# Patient Record
Sex: Female | Born: 1965
Health system: Southern US, Community
[De-identification: ages and names within clinical notes are randomized; demographics above are authoritative.]

## PROBLEM LIST (undated history)

## (undated) DIAGNOSIS — O24419 Gestational diabetes mellitus in pregnancy, unspecified control: Secondary | ICD-10-CM

## (undated) DIAGNOSIS — R32 Unspecified urinary incontinence: Secondary | ICD-10-CM

## (undated) DIAGNOSIS — G4730 Sleep apnea, unspecified: Secondary | ICD-10-CM

## (undated) DIAGNOSIS — J302 Other seasonal allergic rhinitis: Secondary | ICD-10-CM

## (undated) DIAGNOSIS — N939 Abnormal uterine and vaginal bleeding, unspecified: Secondary | ICD-10-CM

## (undated) DIAGNOSIS — Z860100 Personal history of colon polyps, unspecified: Secondary | ICD-10-CM

## (undated) DIAGNOSIS — F32A Depression, unspecified: Secondary | ICD-10-CM

## (undated) DIAGNOSIS — R222 Localized swelling, mass and lump, trunk: Secondary | ICD-10-CM

## (undated) DIAGNOSIS — Z8489 Family history of other specified conditions: Secondary | ICD-10-CM

## (undated) DIAGNOSIS — D649 Anemia, unspecified: Secondary | ICD-10-CM

## (undated) DIAGNOSIS — Z9889 Other specified postprocedural states: Secondary | ICD-10-CM

## (undated) DIAGNOSIS — R112 Nausea with vomiting, unspecified: Secondary | ICD-10-CM

## (undated) DIAGNOSIS — I499 Cardiac arrhythmia, unspecified: Secondary | ICD-10-CM

## (undated) DIAGNOSIS — I10 Essential (primary) hypertension: Secondary | ICD-10-CM

## (undated) DIAGNOSIS — F419 Anxiety disorder, unspecified: Secondary | ICD-10-CM

## (undated) DIAGNOSIS — T8859XA Other complications of anesthesia, initial encounter: Secondary | ICD-10-CM

## (undated) DIAGNOSIS — N200 Calculus of kidney: Secondary | ICD-10-CM

## (undated) DIAGNOSIS — F329 Major depressive disorder, single episode, unspecified: Secondary | ICD-10-CM

## (undated) DIAGNOSIS — M1990 Unspecified osteoarthritis, unspecified site: Secondary | ICD-10-CM

## (undated) DIAGNOSIS — Z8669 Personal history of other diseases of the nervous system and sense organs: Secondary | ICD-10-CM

## (undated) DIAGNOSIS — Z87442 Personal history of urinary calculi: Secondary | ICD-10-CM

## (undated) DIAGNOSIS — C801 Malignant (primary) neoplasm, unspecified: Secondary | ICD-10-CM

## (undated) HISTORY — DX: Abnormal uterine and vaginal bleeding, unspecified: N93.9

## (undated) HISTORY — DX: Personal history of other diseases of the nervous system and sense organs: Z86.69

## (undated) HISTORY — DX: Calculus of kidney: N20.0

## (undated) HISTORY — DX: Localized swelling, mass and lump, trunk: R22.2

## (undated) HISTORY — DX: Malignant (primary) neoplasm, unspecified: C80.1

## (undated) HISTORY — DX: Other seasonal allergic rhinitis: J30.2

## (undated) HISTORY — DX: Personal history of colon polyps, unspecified: Z86.0100

## (undated) HISTORY — DX: Major depressive disorder, single episode, unspecified: F32.9

## (undated) HISTORY — DX: Personal history of colonic polyps: Z86.010

## (undated) HISTORY — DX: Sleep apnea, unspecified: G47.30

## (undated) HISTORY — PX: LAPAROSCOPIC ENDOMETRIOSIS FULGURATION: SUR769

## (undated) HISTORY — DX: Anxiety disorder, unspecified: F41.9

## (undated) HISTORY — PX: INCONTINENCE SURGERY: SHX676

## (undated) HISTORY — DX: Depression, unspecified: F32.A

## (undated) HISTORY — DX: Gestational diabetes mellitus in pregnancy, unspecified control: O24.419

## (undated) HISTORY — DX: Unspecified urinary incontinence: R32

## (undated) HISTORY — PX: TYMPANOPLASTY: SHX33

## (undated) HISTORY — PX: BREAST BIOPSY: SHX20

## (undated) HISTORY — DX: Cardiac arrhythmia, unspecified: I49.9

---

## 1999-08-20 ENCOUNTER — Ambulatory Visit (HOSPITAL_COMMUNITY): Admission: RE | Admit: 1999-08-20 | Discharge: 1999-08-20 | Payer: Self-pay | Admitting: Obstetrics and Gynecology

## 1999-08-20 ENCOUNTER — Encounter: Payer: Self-pay | Admitting: Obstetrics and Gynecology

## 1999-11-17 ENCOUNTER — Emergency Department (HOSPITAL_COMMUNITY): Admission: EM | Admit: 1999-11-17 | Discharge: 1999-11-17 | Payer: Self-pay | Admitting: Emergency Medicine

## 1999-11-17 ENCOUNTER — Encounter: Payer: Self-pay | Admitting: Emergency Medicine

## 2000-01-15 ENCOUNTER — Other Ambulatory Visit: Admission: RE | Admit: 2000-01-15 | Discharge: 2000-01-15 | Payer: Self-pay | Admitting: Obstetrics and Gynecology

## 2000-03-08 ENCOUNTER — Inpatient Hospital Stay (HOSPITAL_COMMUNITY): Admission: AD | Admit: 2000-03-08 | Discharge: 2000-03-08 | Payer: Self-pay | Admitting: Obstetrics and Gynecology

## 2000-08-20 ENCOUNTER — Inpatient Hospital Stay (HOSPITAL_COMMUNITY): Admission: AD | Admit: 2000-08-20 | Discharge: 2000-08-23 | Payer: Self-pay | Admitting: Obstetrics & Gynecology

## 2000-09-27 ENCOUNTER — Emergency Department (HOSPITAL_COMMUNITY): Admission: EM | Admit: 2000-09-27 | Discharge: 2000-09-27 | Payer: Self-pay | Admitting: Emergency Medicine

## 2000-09-27 ENCOUNTER — Encounter: Payer: Self-pay | Admitting: Emergency Medicine

## 2001-01-13 ENCOUNTER — Encounter: Admission: RE | Admit: 2001-01-13 | Discharge: 2001-01-13 | Payer: Self-pay | Admitting: Family Medicine

## 2001-01-13 ENCOUNTER — Encounter: Payer: Self-pay | Admitting: Family Medicine

## 2001-11-25 ENCOUNTER — Encounter: Payer: Self-pay | Admitting: Family Medicine

## 2001-11-25 LAB — CONVERTED CEMR LAB
RBC count: 4.44 10*6/uL
TSH: 1.2 microintl units/mL

## 2002-07-24 ENCOUNTER — Other Ambulatory Visit: Admission: RE | Admit: 2002-07-24 | Discharge: 2002-07-24 | Payer: Self-pay | Admitting: Obstetrics and Gynecology

## 2002-07-24 ENCOUNTER — Other Ambulatory Visit: Admission: RE | Admit: 2002-07-24 | Discharge: 2002-07-24 | Payer: Self-pay | Admitting: Obstetrics & Gynecology

## 2002-10-12 HISTORY — PX: TUBAL LIGATION: SHX77

## 2003-03-28 ENCOUNTER — Encounter (INDEPENDENT_AMBULATORY_CARE_PROVIDER_SITE_OTHER): Payer: Self-pay | Admitting: *Deleted

## 2003-03-28 ENCOUNTER — Inpatient Hospital Stay (HOSPITAL_COMMUNITY): Admission: AD | Admit: 2003-03-28 | Discharge: 2003-04-01 | Payer: Self-pay | Admitting: Obstetrics and Gynecology

## 2004-04-16 ENCOUNTER — Other Ambulatory Visit: Admission: RE | Admit: 2004-04-16 | Discharge: 2004-04-16 | Payer: Self-pay | Admitting: Obstetrics and Gynecology

## 2004-10-12 HISTORY — PX: ENDOMETRIAL ABLATION: SHX621

## 2005-04-13 ENCOUNTER — Ambulatory Visit: Payer: Self-pay | Admitting: Family Medicine

## 2005-06-26 ENCOUNTER — Other Ambulatory Visit: Admission: RE | Admit: 2005-06-26 | Discharge: 2005-06-26 | Payer: Self-pay | Admitting: Obstetrics and Gynecology

## 2005-07-23 ENCOUNTER — Ambulatory Visit: Payer: Self-pay | Admitting: Family Medicine

## 2005-08-28 ENCOUNTER — Encounter (INDEPENDENT_AMBULATORY_CARE_PROVIDER_SITE_OTHER): Payer: Self-pay | Admitting: Specialist

## 2005-08-28 ENCOUNTER — Ambulatory Visit (HOSPITAL_COMMUNITY): Admission: RE | Admit: 2005-08-28 | Discharge: 2005-08-28 | Payer: Self-pay | Admitting: Obstetrics and Gynecology

## 2005-11-06 ENCOUNTER — Ambulatory Visit: Payer: Self-pay | Admitting: Family Medicine

## 2005-11-27 ENCOUNTER — Ambulatory Visit: Payer: Self-pay | Admitting: Family Medicine

## 2006-07-13 ENCOUNTER — Ambulatory Visit: Payer: Self-pay | Admitting: Family Medicine

## 2006-08-18 ENCOUNTER — Ambulatory Visit: Payer: Self-pay | Admitting: Family Medicine

## 2006-09-11 ENCOUNTER — Ambulatory Visit: Payer: Self-pay | Admitting: Family Medicine

## 2006-09-17 ENCOUNTER — Ambulatory Visit: Payer: Self-pay | Admitting: Family Medicine

## 2006-10-18 ENCOUNTER — Ambulatory Visit: Payer: Self-pay | Admitting: Unknown Physician Specialty

## 2007-01-14 ENCOUNTER — Ambulatory Visit: Payer: Self-pay | Admitting: Family Medicine

## 2007-06-13 HISTORY — PX: COLONOSCOPY: SHX174

## 2007-06-16 ENCOUNTER — Ambulatory Visit: Payer: Self-pay | Admitting: Internal Medicine

## 2007-06-24 ENCOUNTER — Ambulatory Visit: Payer: Self-pay | Admitting: Internal Medicine

## 2007-07-13 ENCOUNTER — Ambulatory Visit: Payer: Self-pay | Admitting: Family Medicine

## 2007-08-09 ENCOUNTER — Encounter (INDEPENDENT_AMBULATORY_CARE_PROVIDER_SITE_OTHER): Payer: Self-pay | Admitting: Diagnostic Radiology

## 2007-08-09 ENCOUNTER — Encounter: Admission: RE | Admit: 2007-08-09 | Discharge: 2007-08-09 | Payer: Self-pay | Admitting: Obstetrics and Gynecology

## 2008-07-24 ENCOUNTER — Ambulatory Visit: Payer: Self-pay | Admitting: Family Medicine

## 2008-08-14 ENCOUNTER — Ambulatory Visit: Payer: Self-pay | Admitting: Family Medicine

## 2008-08-15 ENCOUNTER — Ambulatory Visit: Payer: Self-pay | Admitting: Family Medicine

## 2008-08-15 ENCOUNTER — Encounter: Payer: Self-pay | Admitting: Family Medicine

## 2008-08-15 DIAGNOSIS — Z87442 Personal history of urinary calculi: Secondary | ICD-10-CM

## 2008-08-15 DIAGNOSIS — I491 Atrial premature depolarization: Secondary | ICD-10-CM | POA: Insufficient documentation

## 2008-08-15 HISTORY — DX: Personal history of urinary calculi: Z87.442

## 2008-08-15 HISTORY — DX: Atrial premature depolarization: I49.1

## 2008-08-30 ENCOUNTER — Ambulatory Visit: Payer: Self-pay | Admitting: Family Medicine

## 2008-08-30 DIAGNOSIS — R7303 Prediabetes: Secondary | ICD-10-CM | POA: Insufficient documentation

## 2008-08-30 DIAGNOSIS — Z8742 Personal history of other diseases of the female genital tract: Secondary | ICD-10-CM

## 2008-08-30 DIAGNOSIS — E1165 Type 2 diabetes mellitus with hyperglycemia: Secondary | ICD-10-CM | POA: Insufficient documentation

## 2008-09-04 ENCOUNTER — Encounter: Payer: Self-pay | Admitting: Family Medicine

## 2008-09-04 ENCOUNTER — Ambulatory Visit: Payer: Self-pay

## 2008-09-17 ENCOUNTER — Encounter: Payer: Self-pay | Admitting: Family Medicine

## 2008-09-24 ENCOUNTER — Ambulatory Visit: Payer: Self-pay | Admitting: Family Medicine

## 2008-09-24 LAB — CONVERTED CEMR LAB
ALT: 19 units/L (ref 0–35)
AST: 21 units/L (ref 0–37)
Albumin: 3.7 g/dL (ref 3.5–5.2)
Alkaline Phosphatase: 81 units/L (ref 39–117)
BUN: 12 mg/dL (ref 6–23)
Basophils Relative: 0.2 % (ref 0.0–3.0)
CO2: 29 meq/L (ref 19–32)
Chloride: 106 meq/L (ref 96–112)
Creatinine, Ser: 0.7 mg/dL (ref 0.4–1.2)
Eosinophils Absolute: 0.1 10*3/uL (ref 0.0–0.7)
Eosinophils Relative: 1.8 % (ref 0.0–5.0)
GFR calc non Af Amer: 97 mL/min
Glucose, Bld: 93 mg/dL (ref 70–99)
Glucose, Urine, Semiquant: NEGATIVE
HDL: 54.7 mg/dL (ref >39.0)
Ketones, urine, test strip: NEGATIVE
LDL Cholesterol: 122 mg/dL — ABNORMAL HIGH (ref 0–99)
Lymphocytes Relative: 23 % (ref 12.0–46.0)
MCV: 91.7 fL (ref 78.0–100.0)
Monocytes Relative: 9.7 % (ref 3.0–12.0)
Neutrophils Relative %: 65.3 % (ref 43.0–77.0)
Nitrite: POSITIVE
Platelets: 133 10*3/uL — ABNORMAL LOW (ref 150–400)
Potassium: 3.9 meq/L (ref 3.5–5.1)
Protein, U semiquant: 30
RBC: 4.24 M/uL (ref 3.87–5.11)
TSH: 1.55 microintl units/mL (ref 0.35–5.50)
Total CHOL/HDL Ratio: 3.5
VLDL: 15 mg/dL (ref 0–40)
WBC: 7.5 10*3/uL (ref 4.5–10.5)
pH: 6

## 2008-09-26 ENCOUNTER — Ambulatory Visit: Payer: Self-pay | Admitting: Family Medicine

## 2008-10-12 HISTORY — PX: OOPHORECTOMY: SHX86

## 2008-10-12 HISTORY — PX: BLADDER SUSPENSION: SHX72

## 2009-05-06 ENCOUNTER — Ambulatory Visit: Payer: Self-pay | Admitting: Family Medicine

## 2009-05-06 DIAGNOSIS — R21 Rash and other nonspecific skin eruption: Secondary | ICD-10-CM

## 2009-05-12 DIAGNOSIS — R32 Unspecified urinary incontinence: Secondary | ICD-10-CM

## 2009-05-12 HISTORY — PX: ABDOMINAL HYSTERECTOMY: SHX81

## 2009-05-12 HISTORY — PX: COLPORRHAPHY: SHX921

## 2009-05-12 HISTORY — DX: Unspecified urinary incontinence: R32

## 2009-05-21 ENCOUNTER — Inpatient Hospital Stay (HOSPITAL_COMMUNITY): Admission: RE | Admit: 2009-05-21 | Discharge: 2009-05-22 | Payer: Self-pay | Admitting: Obstetrics and Gynecology

## 2009-05-21 ENCOUNTER — Encounter (INDEPENDENT_AMBULATORY_CARE_PROVIDER_SITE_OTHER): Payer: Self-pay | Admitting: Obstetrics and Gynecology

## 2009-05-21 HISTORY — PX: VAGINAL HYSTERECTOMY: SUR661

## 2009-05-22 ENCOUNTER — Encounter (INDEPENDENT_AMBULATORY_CARE_PROVIDER_SITE_OTHER): Payer: Self-pay | Admitting: *Deleted

## 2009-07-11 ENCOUNTER — Ambulatory Visit: Payer: Self-pay | Admitting: Internal Medicine

## 2009-07-17 ENCOUNTER — Ambulatory Visit: Payer: Self-pay | Admitting: Family Medicine

## 2010-01-06 ENCOUNTER — Ambulatory Visit: Payer: Self-pay | Admitting: Family Medicine

## 2010-02-25 ENCOUNTER — Ambulatory Visit: Payer: Self-pay | Admitting: Family Medicine

## 2010-05-20 ENCOUNTER — Encounter (INDEPENDENT_AMBULATORY_CARE_PROVIDER_SITE_OTHER): Payer: Self-pay | Admitting: *Deleted

## 2010-07-10 ENCOUNTER — Ambulatory Visit: Payer: Self-pay | Admitting: Family Medicine

## 2010-07-19 ENCOUNTER — Ambulatory Visit: Payer: Self-pay | Admitting: Family Medicine

## 2010-07-19 LAB — CONVERTED CEMR LAB
Ketones, urine, test strip: NEGATIVE
Nitrite: POSITIVE
Urobilinogen, UA: NEGATIVE

## 2010-07-21 ENCOUNTER — Telehealth: Payer: Self-pay | Admitting: Family Medicine

## 2010-11-02 ENCOUNTER — Encounter: Payer: Self-pay | Admitting: Obstetrics and Gynecology

## 2010-11-11 NOTE — Letter (Signed)
Summary: Virginia Oneal letter  Virginia Oneal at [facility]  [address] [place], [place] [address]   Phone: [phone]  Fax: [phone]       05/20/2010 MRN: [id number]  [name] [address] [place], [place]  [address]  Dear Ms. [name] announce the retirement of Arta Silence, M.D., from full-time practice at the [facility] office effective April 10, 2010 and his plans of returning part-time.  It is important to Dr. Hetty Ely and to our practice that you understand that [facility] has seven physicians in our office for your health care needs.  We will continue to offer the same exceptional care that you have today.    Dr. Hetty Ely has spoken to many of you about his plans for retirement and returning part-time in the fall.   We will continue to work with you through the transition to schedule appointments for you in the office and meet the high standards that [facility] is committed to.   Again, it is with great pleasure that we share the news that Dr. Hetty Ely will return to [facility] in October of 2011 with a reduced schedule.    If you have any questions, or would like to request an appointment with one of our physicians, please call us at [phone] and press the option for Scheduling an appointment.  We take pleasure in providing you with excellent patient care and look forward to seeing you at your next office visit.  Our [facility] Physicians are:  Tillman Abide, M.D. Laurita Quint, M.D. Roxy Manns, M.D. Kerby Nora, M.D. Hannah Beat, M.D. Ruthe Mannan, M.D. We proudly welcomed Raechel Ache, M.D. and Eustaquio Boyden, M.D. to the practice in July/August 2011.  Sincerely,  [facility]

## 2010-11-11 NOTE — Assessment & Plan Note (Signed)
Summary: UTI/DLO   Vital Signs:  Patient profile:   45 year old female Height:      65 inches Weight:      227 pounds BMI:     37.91 O2 Sat:      97 % Temp:     97.3 degrees F oral Pulse rate:   77 / minute BP sitting:   122 / 90  (left arm) Cuff size:   regular  Vitals Entered By: Jarome Lamas (July 19, 2010 12:16 PM) CC: UTI?/pb   History of Present Illness: is having frequency of urination and burning when she urinated this am  saw blood in urine last night -- when she wiped  no fever or nausea or vomiting no new back pain -- has some chronic   does not get these very often   Current Medications (verified): 1)  Zyrtec Allergy 10 Mg Tabs (Cetirizine Hcl) .... One Tablet By Mouth Once Daily 2)  Amoxicillin 500 Mg Caps (Amoxicillin) .... 2 Tabs By Mouth Two Times A Day  Allergies (verified): No Known Drug Allergies  Past History:  Past Medical History: Last updated: 08-10-2009 Kidney Stones 03/23/2000, 2002-03-23  Past Surgical History: Last updated: 08-10-2009 NSVD X 1 PREECLAMPSIA/TOXEMIA COLONOSCOPY POLYP X 1 :(06/30/2004) (No pathology) COLONOSCOPY SMALL EXTERNAL HEMMOROID NEGATIVE POLYPS:(06/24/2007) C-section 6/04 lap-assisted vaginal hysterectomy and l salpingo-oopherectom 05/21/09 TVT sling for urinary incontinence 05/21/09 ablation of endometriosis 05/21/09  Family History: Last updated: 10-Aug-2009 Father: DECEASED YOUNG MI  Mother: DECEASED IN 1999-03-24 61 YOA RENAL FAILURE// COLON CANCER CHF; ANGIOPLASTY// COLON CANCER// + DM Siblings: 1 BROTHER// 4 SISTERS CV: +MOTHER; + FATHER HBP: ALL BROTHERS AND SISTERS EXCEPT 1 DM: + MOTHER  GOUT/ARTHRITIS:  PROSTATE CANCER: BREAST/OVARIAN/UTERINE CANCER: COLON CANCER: + MOTHER, Aunt DEPRESSION: + SELF ETOH/DRUG ABUSE: NEGATIVE OTHER: POSITIVE STROKE MOTHER -- MINI STROKES  Social History: Last updated: 08/10/2009 Marital Status: Married LIVES WITH HUSBAND Children: 2 DAUGHTERS Occupation: [employer] Patient is a former smoker.  Alcohol Use - no Illicit Drug Use - no  Risk Factors: Smoking Status: quit (08/10/09) Packs/Day: QUIT 1995 // 6PYH  (08/15/2008)  Review of Systems General:  Denies chills, fatigue, fever, loss of appetite, and malaise. CV:  Denies chest pain or discomfort. Resp:  Denies cough and shortness of breath. GI:  Denies abdominal pain, nausea, and vomiting. GU:  Complains of dysuria, hematuria, and urinary frequency. MS:  Complains of low back pain. Derm:  Denies itching and rash. Heme:  Denies abnormal bruising.  Physical Exam  General:  overweight but generally well appearing  Eyes:  vision grossly intact, pupils equal, pupils round, and pupils reactive to light.   Mouth:  pharynx pink and moist.   Neck:  No deformities, masses, or tenderness noted. Lungs:  Normal respiratory effort, chest expands symmetrically. Lungs are clear to auscultation, no crackles or wheezes. Heart:  Normal rate and regular rhythm. S1 and S2 normal without gallop, murmur, click, rub or other extra sounds. Abdomen:  no suprapubic tenderness or fullness felt   Msk:  no CVA tenderness  Skin:  Intact without suspicious lesions or rashes Inguinal Nodes:  No significant adenopathy Psych:  normal affect, talkative and pleasant    Impression & Recommendations:  Problem # 1:  UTI (ICD-599.0) Assessment New  cover with cipro  recommend sympt care- see pt instructions  -- will inc water intake disc red flags to watch for -see inst pt advised to update me if symptoms worsen or do not improve  The following medications were removed from the medication list:    Amoxicillin 500 Mg Caps (Amoxicillin) .[name]... 2 tabs by mouth two times a day Her updated medication list for this problem includes:    Cipro 250 Mg Tabs (Ciprofloxacin hcl) .[name]... 1 by mouth two times a day for 7 days  Orders: UA Dipstick w/o Micro (manual) (19147)  Complete Medication List: 1)  Zyrtec Allergy 10  Mg Tabs (Cetirizine hcl) .... One tablet by mouth once daily 2)  Cipro 250 Mg Tabs (Ciprofloxacin hcl) .[name].. 1 by mouth two times a day for 7 days  Patient Instructions: 1)  continue drinking lots of water 2)  call or seek care is symptoms don't improve in 2-3 days or if you develop back pain, nausea, or vomiting or fever  3)  take the cipro as directed  Prescriptions: CIPRO 250 MG TABS (CIPROFLOXACIN HCL) 1 by mouth two times a day for 7 days  #14 x 0   Entered and Authorized by:   Judith Part MD   Signed by:   Judith Part MD on 07/19/2010   Method used:   [facility]  [address] [facility]       [address]       [place], [place]  [address]       Ph: [phone] or [phone]       Fax: [phone]   RxID:   [id number]   Laboratory Results   Urine Tests   Date/Time Reported: Lamar Sprinkles, [facility]  July 19, 2010 12:25 PM   Routine Urinalysis   Color: yellow Appearance: Hazy Glucose: negative   (Normal Range: Negative) Bilirubin: negative   (Normal Range: Negative) Ketone: negative   (Normal Range: Negative) Spec. Gravity: 1.025   (Normal Range: 1.003-1.035) Blood: large   (Normal Range: Negative) pH: 6.0   (Normal Range: 5.0-8.0) Protein: negative   (Normal Range: Negative) Urobilinogen: negative   (Normal Range: 0-1) Nitrite: positive   (Normal Range: Negative) Leukocyte Esterace: small   (Normal Range: Negative)

## 2010-11-11 NOTE — Assessment & Plan Note (Signed)
Summary: FLU SHOT/JRR   Nurse Visit   Allergies: No Known Drug Allergies  Immunizations Administered:  Influenza Vaccine # 1:    Vaccine Type: Fluvax 3+    Site: left deltoid    Mfr: GlaxoSmithKline    Dose: 0.5 ml    Route: IM    Given by: Mervin Hack CMA (AAMA)    Exp. Date: 04/11/2011    Lot #: [id number]    VIS given: 05/06/10 version given July 10, 2010.  Flu Vaccine Consent Questions:    Do you have a history of severe allergic reactions to this vaccine? no    Any prior history of allergic reactions to egg and/or gelatin? no    Do you have a sensitivity to the preservative Thimersol? no    Do you have a past history of Guillan-Barre Syndrome? no    Do you currently have an acute febrile illness? no    Have you ever had a severe reaction to latex? no    Vaccine information given and explained to patient? yes    Are you currently pregnant? no  Orders Added: 1)  Flu Vaccine 66yrs + [90658] 2)  Admin 1st Vaccine [09811]

## 2010-11-11 NOTE — Assessment & Plan Note (Signed)
Summary: ?SINUS INFECTION/CLE   Vital Signs:  Patient profile:   45 year old female Weight:      223.25 pounds Temp:     97.6 degrees F oral Pulse rate:   76 / minute Pulse rhythm:   regular BP sitting:   110 / 80  (left arm) Cuff size:   large  Vitals Entered By: Sydell Axon LPN (Feb 25, 2010 3:54 PM) CC: ? Sinus infection, head congestion/green, sore throat and ears hurt   History of Present Illness: Pt here for congestion. Her daughter has recently had strep.  She had fever to 100.4 and was freezing, she has head congestion but no headache, mild right ear pain, some rhinitis that is green all day long, signif ST, cough that is minimally productive of same green, no SOB, no N/V. She started Mucous Relief last weekend.  Problems Prior to Update: 1)  Uri  (ICD-465.9) 2)  Hepatic Cysts  (ICD-573.8) 3)  Rash and Other Nonspecific Skin Eruption  (ICD-782.1) 4)  Diabetes Mellitus, Gestational, Hx of  (ICD-V13.29) 5)  Renal Calculus, Hx of  (ICD-V13.01) 6)  Premature Atrial Contractions  (ICD-427.61)  Medications Prior to Update: 1)  Caltrate 600+d 600-400 Mg-Unit Tabs (Calcium Carbonate-Vitamin D) .... One Tablet By Mouth Every Other Day 2)  Zyrtec Allergy 10 Mg Tabs (Cetirizine Hcl) .... One Tablet By Mouth Once Daily 3)  Amoxicillin 500 Mg Caps (Amoxicillin) .... 2 Tabs By Mouth Two Times A Day  Allergies: No Known Drug Allergies  Physical Exam  General:  Mildly overweight but generally well appearing. Acutely congested nasally.  Head:  normocephalic, atraumatic, and no abnormalities observed.  Sinuses mildly tender max distr. Eyes:  Conjunctiva clear bilaterally.  Ears:  R ear TM normal with mildly inflamed canal and L ear mildly congested with dull LR.[name]   Nose:  Congested and mildly inflamed with thick gray discharge. Unable to breathe through her nose. Mouth:  pharynx pink and moist.  No erythema or swellling noted, minimal PND. Neck:  No deformities, masses, or  tenderness noted. Lungs:  Normal respiratory effort, chest expands symmetrically. Lungs are clear to auscultation, no crackles or wheezes. Heart:  Normal rate and regular rhythm. S1 and S2 normal without gallop, murmur, click, rub or other extra sounds.   Impression & Recommendations:  Problem # 1:  URI (ICD-465.9) Assessment Unchanged  Recurrent. See instructions. Her updated medication list for this problem includes:    Zyrtec Allergy 10 Mg Tabs (Cetirizine hcl) ..... One tablet by mouth once daily  Instructed on symptomatic treatment. Call if symptoms persist or worsen.   Complete Medication List: 1)  Zyrtec Allergy 10 Mg Tabs (Cetirizine hcl) .... One tablet by mouth once daily 2)  Amoxicillin 500 Mg Caps (Amoxicillin) .... 2 tabs by mouth two times a day  Patient Instructions: 1)  Take Amox. 2)  Take Guaifenesin by [facility] or [facility] and getting MUCOUS RELIEF EXPECTORANT (400mg ), take 11/2 tabs by mouth AM and NOON. 3)  Drink lots of fluids anytime taking Guaifenesin.  4)  Take Tyl Es 2 tab by mouth three times a day. 5)  Keep lozenge in mouth all day. Prescriptions: AMOXICILLIN 500 MG CAPS (AMOXICILLIN) 2 tabs by mouth two times a day  #40 x 0   Entered and Authorized by:   Shaune Leeks MD   Signed by:   Shaune Leeks MD on 02/25/2010   Method used:   [facility]  [facility]  [address] ([facility]ail)       [address]       [place], [place]  [address]       Ph: [phone] or [phone]       Fax: [phone]   RxID:   [id number]   Current Allergies (reviewed today): No known allergies

## 2010-11-11 NOTE — Assessment & Plan Note (Signed)
Summary: CONGESTION,ST/CLE   Vital Signs:  Patient profile:   45 year old female Weight:      221.75 pounds Temp:     98.2 degrees F oral Pulse rate:   88 / minute Pulse rhythm:   regular BP sitting:   110 / 74  (left arm) Cuff size:   large  Vitals Entered By: Sydell Axon LPN (January 06, 2010 9:10 AM) CC: Sore throat, body aches, head congestion, blew nose this morning and blood came out   History of Present Illness: Pt here for congestion.  She had chills last night, no fever noted via thermometer. She aches all over, feels loke she has been run over by a truck. Her ear hurts on the left. She has rhinitis, green and blood this AM.  She has ST and her daughter was diagnosed with Strep and seen at her doctor's via strep test. She has cough minimally productive. She has no N/V, no SOB.  She has been taking Guafenesin.  Problems Prior to Update: 1)  Hepatic Cysts  (ICD-573.8) 2)  Rash and Other Nonspecific Skin Eruption  (ICD-782.1) 3)  Diabetes Mellitus, Gestational, Hx of  (ICD-V13.29) 4)  Renal Calculus, Hx of  (ICD-V13.01) 5)  Premature Atrial Contractions  (ICD-427.61)  Medications Prior to Update: 1)  Caltrate 600+d 600-400 Mg-Unit Tabs (Calcium Carbonate-Vitamin D) .... One Tablet By Mouth Every Other Day 2)  Zyrtec Allergy 10 Mg Tabs (Cetirizine Hcl) .... One Tablet By Mouth Once Daily  Allergies: No Known Drug Allergies  Physical Exam  General:  Mildly overweight but generally well appearing. Acutely congested nasally.  Head:  normocephalic, atraumatic, and no abnormalities observed.  Sinuses mildly tender max distr. Eyes:  Conjunctiva clear bilaterally.  Ears:  R ear normal and L ear mildly congested with dull LR.[name]   Nose:  Congested and mildly inflamed with thick gray discharge. Mouth:  pharynx pink and moist.  No erythema or swellling noted, minimal PND. Neck:  No deformities, masses, or tenderness noted. Lungs:  Normal respiratory effort, chest expands  symmetrically. Lungs are clear to auscultation, no crackles or wheezes. Heart:  Normal rate and regular rhythm. S1 and S2 normal without gallop, murmur, click, rub or other extra sounds.   Impression & Recommendations:  Problem # 1:  URI (ICD-465.9) Assessment New  Prob Flu witjh sinus congestion. Will prophylax to avoid sinus infection. See instructions. Her updated medication list for this problem includes:    Zyrtec Allergy 10 Mg Tabs (Cetirizine hcl) ..... One tablet by mouth once daily  Instructed on symptomatic treatment. Call if symptoms persist or worsen.   Complete Medication List: 1)  Caltrate 600+d 600-400 Mg-unit Tabs (Calcium carbonate-vitamin d) .... One tablet by mouth every other day 2)  Zyrtec Allergy 10 Mg Tabs (Cetirizine hcl) .... One tablet by mouth once daily 3)  Amoxicillin 500 Mg Caps (Amoxicillin) .... 2 tabs by mouth two times a day  Patient Instructions: 1)  Take Amox.  2)  Take Guaifenesin by [facility] and getting MUCOUS RELIEF EXPECTORANT (400mg ), take 11/2 tabs by mouth AM and NOON. 3)  Drink lots of fluids anytime taking Guaifenesin.  4)  Keep lozenge in mouth. 5)  Gargle every half hour for two days with warm salt water.  6)  Take Tyl ES 2 tabs three times a day.  Prescriptions: AMOXICILLIN 500 MG CAPS (AMOXICILLIN) 2 tabs by mouth two times a day  #40 x 0   Entered and  Authorized by:   Shaune Leeks MD   Signed by:   Shaune Leeks MD on 01/06/2010   Method used:   [facility] to        [facility]  [address] [facility]ail)       [address]       [place], [place]  [address]       Ph: [phone] or [phone]       Fax: [phone]   RxID:   [id number]   Current Allergies (reviewed today): No known allergies

## 2010-11-11 NOTE — Progress Notes (Signed)
Summary: call a nurse   Phone Note Call from Patient   Summary of Call: Triage Record Num: [id number] Operator: Edgar Frisk Patient Name: Virginia Oneal Call Date & Time: 07/18/2010 11:51:38PM Patient Phone: [phone] PCP: Arta Silence Patient Gender: Female PCP Fax : Patient DOB: Sep 05, 1966 Practice Name: [facility] Reason for Call: pt calling reports has blood in urine with burning on urination, Denies back , abd or flank pain, denies emergent symptoms. Care advice per guideline SS for call back reviewed pt to be seen in am. Protocol(s) Used: Bloody Urine Recommended Outcome per Protocol: See Provider within 24 hours Reason for Outcome: Has one or more urinary tract symptoms Care Advice:  ~ Go to the ED IMMEDIATELY if repeated vomiting or severe pain occurs.  ~ Call provider if you develop flank or low back pain, fever, chills, or generally feel sick. Limit carbonated, alcoholic, and caffeinated beverages such as coffee, tea and soda. Avoid nonprescription cold and allergy medications that contain caffeine. Limit intake of tomatoes, fruit juices (except for unsweetened cranberry juice), dairy products, spicy foods, sugar, and artificial sweeteners (aspartame or saccharine). Stop or decrease smoking. Reducing exposure to bladder irritants may help lessen urgency.  ~ Increase intake of fluids to 8-16 eight oz. glasses (2,000 to 4,000 cc per day or 1.6 to 3.2 L) so urine is a pale yellow color, to help flush bacteria from the bladder, unless instructed to restrict fluid intake for other medical reasons. Limit fluids with sugar.  ~ Analgesic/Antipyretic Advice - Acetaminophen: Consider acetaminophen as directed on label or by pharmacist/provider for pain or fever PRECAUTIONS: - Use if there is no history of liver disease, alcoholism, or intake of three or more alcohol drinks per day - Only if approved by provider during pregnancy or when breastfeeding - During  pregnancy, acetaminophen should not be taken more than 3 consecutive days without telling provider - Do not exceed recommended dose or fr Initial call taken by: Melody Comas,  July 21, 2010 11:22 AM  Follow-up for Phone Call        seen 10.8.2011 by Dr. Milinda Antis Follow-up by: Crawford Givens MD,  July 21, 2010 1:39 PM

## 2010-12-16 ENCOUNTER — Ambulatory Visit (INDEPENDENT_AMBULATORY_CARE_PROVIDER_SITE_OTHER): Payer: 59 | Admitting: Family Medicine

## 2010-12-16 ENCOUNTER — Encounter: Payer: Self-pay | Admitting: Family Medicine

## 2010-12-16 DIAGNOSIS — M25519 Pain in unspecified shoulder: Secondary | ICD-10-CM

## 2010-12-23 NOTE — Assessment & Plan Note (Signed)
Summary: right shoulder pain/ can not raise arm jrt   Vital Signs:  Patient profile:   45 year old female Weight:      228 pounds Temp:     98.2 degrees F oral Pulse rate:   80 / minute Pulse rhythm:   regular BP sitting:   110 / 80  (left arm) Cuff size:   large  Vitals Entered By: Selena Batten Dance CMA Duncan Dull) (December 16, 2010 11:37 AM) CC: Right shoulder pain   History of Present Illness: CC: R shoulder pain  shoulder hurting for last several weeks, now trouble lifting arm.  Yesterday moving heavy books for a couple hours, last night especially bad.  Trying ibuprofen and icy/hot which helps some.  Sometimes feels knot there.  worse when lifting anything heavy, owrse with reaching overhead, worse at night certain movements in bed.  Mild neck pain today, not prior.  No shooting pain down arm, no left sided pain.  No tingling/numbness in arm.  Denies inciting injury/trauma.  Never had anything like this before.  Current Medications (verified): 1)  Est Estrogens-Methyltest 1.25-2.5 Mg Tabs (Est Estrogens-Methyltest) .[name].. 1 By Mouth Once Daily  Allergies (verified): No Known Drug Allergies  Past History:  Past Medical History: Last updated: 07/11/2009 Kidney Stones 2001, 2003  Social History: Last updated: 07/11/2009 Marital Status: Married LIVES WITH HUSBAND Children: 2 DAUGHTERS Occupation: [employer] Patient is a former smoker.  Alcohol Use - no Illicit Drug Use - no  Past Surgical History: NSVD X 1 PREECLAMPSIA/TOXEMIA COLONOSCOPY POLYP X 1 :(06/30/2004) (No pathology) COLONOSCOPY SMALL EXTERNAL HEMMOROID NEGATIVE POLYPS:(06/24/2007) C-section 6/04 lap-assisted vaginal hysterectomy and l salpingo-oopherectom 05/21/09 TVT sling for urinary incontinence 05/21/09 ablation of endometriosis 05/21/09  Review of Systems       per HPI  Physical Exam  General:  overweight but generally well appearing  Msk:  neg spurling.  neck FROM.   no deformity at shoulders  bilaterally. tender posterior shoulder on right no pain with testing lat/med rotation against resistance + pain R side with empty can sign no pain with rotatino of humeral head in GH joitn. limited ROM R arm reach up back compared to L. neg apprehension test Pulses:  2+ rad pulses Neurologic:  sensation intact   Impression & Recommendations:  Problem # 1:  SHOULDER PAIN, RIGHT (ICD-719.41) likely rotator cuff tendinopathy.  treat accordingly with NSAIDs, ice, rest.  SM patient advisor RTC injury handout provided and discussed stretching exercises.  if not better, consider referral to PT.  Her updated medication list for this problem includes:    Naprosyn 500 Mg Tabs (Naproxen) .[name]... Take one twice daily with food x 7 days then as needed shoulder pain  Complete Medication List: 1)  Est Estrogens-methyltest 1.25-2.5 Mg Tabs (Est estrogens-methyltest) .[name].. 1 by mouth once daily 2)  Naprosyn 500 Mg Tabs (Naproxen) .... Take one twice daily with food x 7 days then as needed shoulder pain  Patient Instructions: 1)  Sounds like rotator cuff tendonitis - or inflammation of the rotator cuff tendons. 2)  Treat with ice, rest, anti inflammatories. 3)  Stretching exercises provided. 4)  Please return if worsening or not better after 2-3 wks.  next step may be physical therapy. Prescriptions: NAPROSYN 500 MG TABS (NAPROXEN) take one twice daily with food x 7 days then as needed shoulder pain  #40 x 0   Entered and Authorized by:   Eustaquio Boyden  MD   Signed by:   Eustaquio Boyden  MD on 12/16/2010   Method used:   [facility]        [facility]  [address] ([facility]ail)       [address]       [place], [place]  [address]       Ph: [phone] or [phone]       Fax: [phone]   RxID:   [id number]    Orders Added: 1)  Est. Patient Level III [[address]]    Current Allergies (reviewed today): No known allergies

## 2011-01-17 LAB — CBC
HCT: 35.3 % — ABNORMAL LOW (ref 36.0–46.0)
HCT: 42.2 % (ref 36.0–46.0)
Hemoglobin: 12.4 g/dL (ref 12.0–15.0)
Hemoglobin: 14.6 g/dL (ref 12.0–15.0)
MCHC: 34.6 g/dL (ref 30.0–36.0)
MCV: 93.2 fL (ref 78.0–100.0)
Platelets: 140 10*3/uL — ABNORMAL LOW (ref 150–400)
RBC: 3.79 MIL/uL — ABNORMAL LOW (ref 3.87–5.11)
RDW: 13.1 % (ref 11.5–15.5)
WBC: 12.8 10*3/uL — ABNORMAL HIGH (ref 4.0–10.5)

## 2011-01-17 LAB — URINALYSIS, ROUTINE W REFLEX MICROSCOPIC
Bilirubin Urine: NEGATIVE
Glucose, UA: NEGATIVE mg/dL
Ketones, ur: NEGATIVE mg/dL
Protein, ur: NEGATIVE mg/dL

## 2011-01-17 LAB — BASIC METABOLIC PANEL
BUN: 16 mg/dL (ref 6–23)
CO2: 30 mEq/L (ref 19–32)
Calcium: 9.3 mg/dL (ref 8.4–10.5)
Chloride: 107 mEq/L (ref 96–112)
Creatinine, Ser: 0.71 mg/dL (ref 0.4–1.2)
GFR calc Af Amer: >60 mL/min (ref >60)
Glucose, Bld: 93 mg/dL (ref 70–99)
Potassium: 3.9 mEq/L (ref 3.5–5.1)
Potassium: 4 mEq/L (ref 3.5–5.1)
Sodium: 138 mEq/L (ref 135–145)
Sodium: 138 mEq/L (ref 135–145)

## 2011-01-17 LAB — URINE MICROSCOPIC-ADD ON

## 2011-01-30 ENCOUNTER — Encounter: Payer: Self-pay | Admitting: Family Medicine

## 2011-01-30 ENCOUNTER — Ambulatory Visit (INDEPENDENT_AMBULATORY_CARE_PROVIDER_SITE_OTHER): Payer: 59 | Admitting: Family Medicine

## 2011-01-30 ENCOUNTER — Ambulatory Visit: Payer: 59 | Admitting: Family Medicine

## 2011-01-30 VITALS — BP 122/80 | HR 72 | Temp 97.5°F | Ht 66.0 in | Wt 222.1 lb

## 2011-01-30 DIAGNOSIS — M25519 Pain in unspecified shoulder: Secondary | ICD-10-CM

## 2011-01-30 DIAGNOSIS — M25511 Pain in right shoulder: Secondary | ICD-10-CM

## 2011-01-30 LAB — HM COLONOSCOPY

## 2011-01-30 NOTE — Assessment & Plan Note (Signed)
Deteriorated. Discussed options- PT versus ortho referral. I do not think she has a full tear given her mobility but MRI may be helpful for more information. Pt would like to go ahead and see ortho. Referral placed.

## 2011-01-30 NOTE — Patient Instructions (Signed)
    Rotator Cuff Injury The rotator cuff is the collective set of muscles and tendons that make up the stabilizing unit of your shoulder. This unit holds in the ball of the humerus (upper arm bone) in the socket of the scapula (shoulder blade). Injuries to this stabilizing unit most commonly come from sports or activities that cause the arm to be moved repeatedly over the head. Examples of this include throwing, weight lifting, swimming, racquet sports, or an injury such as falling on your arm. Chronic (longstanding) irritation of this unit can cause inflammation (soreness), bursitis, and eventual damage to the tendons to the point of rupture (tear). An acute (sudden) injury of the rotator cuff can result in a partial or complete tear. You may need surgery with complete tears. Small or partial rotator cuff tears may be treated conservatively with temporary immobilization, exercises and rest. Physical therapy may be needed. HOME CARE INSTRUCTIONS  You may want to sleep on several pillows or in a recliner at night to lessen swelling and pain.   Only take over-the-counter or prescription medicines for pain, discomfort, or fever as directed by your caregiver.   Do simple hand squeezing exercises with a soft rubber ball to decrease hand swelling.  SEEK MEDICAL CARE IF:  Pain in your shoulder increases or new pain or numbness develops in your arm, hand, or fingers.   Your hand or fingers are colder than your other hand.  SEEK IMMEDIATE MEDICAL CARE IF:  Your arm, hand, or fingers are numb or tingling.   Your arm, hand, or fingers are increasingly swollen and painful, or turn white or blue.  Document Released: 09/25/2000 Document Re-Released: 12/25/2008 [facility] Patient Information 2011 [place], [place].

## 2011-01-30 NOTE — Progress Notes (Signed)
45 yo new to me here for persistent right shoulder pain.   Shoulder hurting for past two months.  Saw Dr. Reece Agar last month.    Trying Naprosyn and icy/hot which helps some. Given stretches to try. Sometimes feels knot there.  worse when lifting anything heavy, worse with reaching overhead, worse at night certain movements in bed.    No shooting pain down arm, no left sided pain.  No tingling/numbness in arm.  She fell at church in January and is not sure if she landed on that side.  No other known injury.  Never had anything like this before.  Review of Systems        per HPI  Physical Exam BP 122/80  Pulse 72  Temp(Src) 97.5 F (36.4 C) (Oral)  Ht 5\' 6"  (1.676 m)  Wt 222 lb 1.9 oz (100.753 kg)  BMI 35.85 kg/m2  General:  overweight but generally well appearing  Msk:  neg spurling.  neck FROM.   no deformity at shoulders bilaterally. tender posterior shoulder on right no pain with testing lat/med rotation against resistance + pain R side with empty can sign no pain with rotatino of humeral head in GH joitn. limited ROM R arm reach up back compared to L. neg apprehension test Pulses:  2+ rad pulses Neurologic:  sensation intact

## 2011-02-24 NOTE — Op Note (Signed)
Virginia Oneal, Virginia Oneal                 ACCOUNT NO.:  [id number]   MEDICAL RECORD NO.:  [id number]          PATIENT TYPE:  INP   LOCATION:  9309                          FACILITY:  [facility]   PHYSICIAN:  Randye Lobo, M.D.   DATE OF BIRTH:  1965/11/18   DATE OF PROCEDURE:  05/21/2009  DATE OF DISCHARGE:                               OPERATIVE REPORT   PREOPERATIVE DIAGNOSES:  1. Genuine stress incontinence.  2. Menorrhagia.  3. Dysmenorrhea.  4. Complex left adnexal mass.   POSTOPERATIVE DIAGNOSES:  1. Genuine stress incontinence.  2. Menorrhagia.  3. Dysmenorrhea.  4. Complex left adnexal mass.  5. Endometriosis.  6. Hepatic cyst.   PROCEDURES:  Laparoscopically-assisted vaginal hysterectomy with left  salpingo-oophorectomy, collection of pelvic washings, fulguration of  right ovarian endometriosis, TVT sling, cystoscopy.   SURGEON:  Conley Simmonds, MD   ASSISTANT:  Luvenia Redden, MD   ANESTHESIA:  General endotracheal, local with 0.5% lidocaine with  epinephrine 1:200,000.   IV FLUIDS:  1700 mL Ringer's lactate.   ESTIMATED BLOOD LOSS:  300 mL.   URINE OUTPUT:  800 mL.   COMPLICATIONS:  None.   INDICATIONS FOR PROCEDURE:  The patient is a 45 year old para 2  Caucasian female, status post bilateral tubal ligation and status post  previous endometrial ablation, who presents with a chief complaint of  urinary incontinence with stressful maneuvers.  Urodynamics confirmed  the presence of genuine stress incontinence with a leak point pressure  of 81 cm of water.  The patient desired for surgical treatment of her  urinary incontinence.  She also reported heavy menstruation and  dysmenorrhea every other month.  She had previously had the endometrial  ablation and wished to have definitive treatment of her bleeding at the  time of her planned mid urethral sling and cystoscopy.  The patient did  undergo a preoperative ultrasound few days prior to her surgery on  May 17, 2009,  and this documented a 6-cm complex left ovarian mass  with some increase in blood flow.  The endometrial stripe measured 2.8  mm and the uterus was normal.  The right ovary was unremarkable and  there was no free fluid in the abdomen or pelvis.  A preoperative CA-125  measured 12.9 and a CEA measured 0.6.  A plan is now made at this time  to proceed with a laparoscopically-assisted vaginal hysterectomy with  probable bilateral salpingo-oophorectomy, collection of pelvic washings,  a TVT mid urethral sling and cystoscopy after risks, benefits, and  alternatives had been reviewed.   FINDINGS:  Laparoscopy revealed a 4-5 cm slightly enlarged left ovary.  The ovary did not contain any obvious lesions on the surface.  It was  not possible to tell if there was reallya cyst internally.  The right  ovary contained classic lesions of endometriosis including one chocolate  cystic lesion on the surface and some smaller vesicular lesions.  The  fallopian tubes were consistent with a prior tubal ligation.  The uterus  was unremarkable.  The appendix tip appeared to be normal as did the  rest of  the appendix.  In the upper abdomen, there was an 8 cm cyst,  which appeared to be coming from the left lobe of the liver.  The  gallbladder was not well visualized.  All of the peritoneal surfaces in  the abdomen and pelvis were unremarkable as was the omentum.   After the abdominal findings have failed to confirm a definitive left  ovarian cyst, a rectal exam was performed, which documented no palpable  masses rectally.   The remainder of the procedure was then performed.  A decision was made  to proceed with the laparoscopically-assisted vaginal hysterectomy with  removal of left tube and ovary.  Pelvic washings were performed and set  aside.  The left ureter was then identified and then the left  infundibulopelvic ligament was grasped with a tripolar instrument and  was triply cauterized and then cut.   Dissection was continued to the  left broad ligament again with the same instrument.  The left round  ligament was similarly grasped, cauterized, and then bisected.  Dissection continued through the anterior leaf of the left broad  ligament.  The bladder flap was taken down on the patient's left-hand  side again using the tripolar cautery instrument.   On the right-hand side, the lesions of endometriosis on the ovary were  fulgurated with the tripolar instrument.  The proximal right fallopian  tube was cauterized and then bisected.  This was also performed with the  right utero-ovarian ligament and then continuing through the right round  ligament again using the same instrumentation.  The anterior leaf of the  broad ligament was taken down on the right-hand side.  The uterine  arteries on each side were skeletonized after the bladder flap had been  taken down.  The uterine arteries were cauterized, but not cut.   The remainder of the hysterectomy was then performed vaginally.   The patient was placed in the high lithotomy position and the previously  placed Hulka tenaculum was replaced by a single-toothed tenaculum on the  anterior cervical lip and a Jacobs tenaculum on the posterior cervical  lip.   The patient's Foley catheter was left to gravity drainage throughout the  the patient's entire procedure.   The cervix was injected circumferentially with 0.5% lidocaine with  1:200,000 of epinephrine.  The cervix was then circumscribed with the  scalpel.  The posterior cul-de-sac was entered sharply with the Mayo  scissors.  Digital exam confirmed proper entry to the space.  A long  weighted speculum was placed in the posterior cul-de-sac.  Each of the  uterosacral ligaments were then clamped, sharply divided, and suture  ligated with transfixing sutures of 0 Vicryl.  Dissection then continued  to the cardinal ligaments, which were clamped, sharply divided, and  suture ligated with 0  Vicryl bilaterally.  The specimen was freed at  this time.  It was sent to pathology.  The pedicle sites were  reexamined.  Each of the uterosacral ligaments were bleeding slightly  and were oversewn with figure-of-eight sutures of 0 Vicryl, which could  create a good hemostasis.   A moistened sponge was placed in the cul-de-sac at this time.  A running  suture of 0 Vicryl was used to close the posterior vaginal cuff to  create good hemostasis.  The sponge was removed from the cul-de-sac at  this time and the vaginal mucosa was closed with a running locked suture  of 0 Vicryl.   The sling was performed next.  Lavena Bullion  clamps were used to mark the  midline of the anterior vaginal wall from a level of 1 cm below the  urethral meatus to 2 cm above the vaginal cuff.  The mucosa was injected  locally with 0.5% lidocaine with 1:200,000 of epinephrine.  The vaginal  mucosa was then incised vertically in the midline with a scalpel.  Sharp  and blunt dissection were used to dissect the bladder and the subvaginal  tissue off of the overlying vaginal mucosa.  The dissection was carried  back to the level of the pubic rami bilaterally and then down to the  level of just above the vaginal cuff.  Hemostasis during the dissection  was created with monopolar cautery.   The TVT sling was performed in a top-down fashion.  Two 1-cm incisions  were created 2 cm to the right and left in the midline.  The TVT  abdominal needle passer was then placed through the right suprapubic  incision and out through the endopelvic fascia at the level of the mid  urethra and lateral to this on the ipsilateral side.  The same procedure  was performed on the patient's left-hand side.   The Foley catheter was removed and cystoscopy was performed and the  findings are as noted above.  The bladder was drained of cystoscopy  fluid and the sling was attached to the abdominal needle passers and  drawn up through the suprapubic  incisions bilaterally.  The plastic  sheaths were then separated from the sling and a Kelly clamp was placed  between the sling and urethra as the plastic sheaths were removed.  Excess sling was trimmed suprapubically and the sling was noted to be in  excellent position.   The anterior colporrhaphy was performed with vertical mattress sutures  of 2-0 and 0 Vicryl.  Gelfoam was placed in each of the corners of the  exit sites of the sling along the endopelvic fascia.  Hemostasis was  good at this time.  Excess vaginal mucosa was trimmed from the anterior  vaginal wall and the vaginal mucosa was then closed with a running  locked suture of 2-0 Vicryl.   A packing with Estrace cream was placed inside the vagina.  The  suprapubic incisions were closed with Dermabond.   Final laparoscopy was performed after a CO2 pneumoperitoneum was  reachieved.  The pelvis was irrigated and suctioned and found to be  hemostatic and the procedure was therefore concluded.  The 5-mm trocars  were removed under visualization of the laparoscope.  The umbilical  trocar and laparoscope were removed simultaneously.   The umbilical incision was closed with subcuticular sutures of 3-0 plain  gut suture.  All incisions from the laparoscopic procedure were closed  with Dermabond.   This concluded the patient's procedure.  There were no complications.  All needle, instrument, and sponge counts were correct.  The patient was  escorted to the recovery room in stable and awake condition.   The patient will have an abdominal and pelvic CT scan postoperatively.      Randye Lobo, M.D.  Electronically Signed     BES/MEDQ  D:  05/21/2009  T:  05/21/2009  Job:  [id number]

## 2011-02-27 NOTE — Discharge Summary (Signed)
[facility]  Patient:    Virginia Oneal, Virginia Oneal                        MRN: [id number] Adm. Date:  [id number] Disc. Date: [id number] Attending:  Miguel Aschoff Dictator:   [name], P.A.                           Discharge Summary  FINAL DIAGNOSES:              1. Intrauterine pregnancy at [redacted] weeks                                  gestation.                               2. Preeclampsia.                               3. Moderate fetal heart rate decelerations.                               4. Maternal tachycardia.  PROCEDURE:                    Vacuum-assisted vaginal delivery with episiotomy                               repair.  SURGEON:                      Dr. Conley Simmonds.  COMPLICATIONS:                None.  ADMISSION HISTORY:            This 45 year old, G2, P 0-0-1-0, presents at 40 weeks with contractions.  The patients antepartum course was complicated by pregnancy-induced hypertension.  HOSPITAL COURSE:              Upon admission the patients blood pressure was about 174/104.  Cervix was only about fingertip dilated at this point.  The patient was also noted to have some peripheral edema of her lower extremity and evidence of hyperreflexia.  At this point the patients platelets were about 121,000.  She did have some protein in her urine, but otherwise her liver function tests were within normal limits.  The patient was begun on magnesium sulfate at this point.  She progressed to complete, complete, and began pushing.  Some mild variables were noted.  At this point the patient was able to bring the fetal vertex to a +3 station, but the variables became more moderate at this time.  The patient also started to develop some maternal tachycardia.  At this point a vacuum was applied to assist with second stage of labor.  The patient had a vacuum-assisted vaginal delivery of an 8-pound 0-ounce female infant with Apgars of 8 and 9.  The delivery  went without complication.  The patient did have a midline episiotomy that was repaired.  The delivery without complication.  The patients blood pressures started to return to normal in her postpartum stay.  She was continued on magnesium sulfate for  24 hours.  She was also started on some iron for some postpartum anemia.  She was discharged from the [facility] on November 11, and she was ambulating well.  By postpartum day #3, the patient was doing well, she was not having any fevers or headaches, her blood pressure was in the 140/90 range, her platelets were going back up to normal, and she was felt ready for discharge.  DISCHARGE INSTRUCTIONS:       She was sent home on a regular diet and told to decrease activities.  DISCHARGE MEDICATIONS:        Told to continue her iron.  Was given Darvocet-N 100 #30 one q.4h. p.r.n. pain.  DISCHARGE FOLLOWUP:           She was also told to follow up in the office in one week to recheck her blood pressure and then we would see her again in four weeks for her postpartum check. DD:  09/20/00 TD:  09/20/00 Job: [id number]

## 2011-02-27 NOTE — Op Note (Signed)
Virginia Oneal, Virginia Oneal                           ACCOUNT NO.:  [id number]   MEDICAL RECORD NO.:  [id number]                   PATIENT TYPE:  INP   LOCATION:  9105                                 FACILITY:  [facility]   PHYSICIAN:  Randye Lobo, M.D.                DATE OF BIRTH:  Apr 12, 1966   DATE OF PROCEDURE:  03/29/2003  DATE OF DISCHARGE:                                 OPERATIVE REPORT   PREOPERATIVE DIAGNOSES:  1. Intrauterine gestation at 40 plus 6 weeks.  2. Gestational diabetes mellitus, diet-controlled.  3. Nonreassuring fetal assessment.  4. Gestational thrombocytopenia.  5. Multiparous female, desire for permanent sterilization.   POSTOPERATIVE DIAGNOSIS:  1. Intrauterine gestation at 40 plus 6 weeks.  2. Gestational diabetes mellitus, diet-controlled.  3. Nonreassuring fetal assessment.  4. Gestational thrombocytopenia.  5. Triple nuchal cord.  6. Multiparous female, desire for permanent sterilization.   PROCEDURES:  1. Primary low segment transverse cesarean section.  2. Bilateral tubal ligation.   SURGEON:  Randye Lobo, M.D.   ANESTHESIA:  Spinal by Mayra Neer. Maisie Fus, M.D.   INTRAVENOUS FLUIDS:  2500 mL of Ringer's lactate.   ESTIMATED BLOOD LOSS:  800 mL.   URINE OUTPUT:  300 mL.   COMPLICATIONS:  None.   INDICATION FOR PROCEDURE:  The patient is a 44 year old gravida 2, para 1-0-  0-1, Caucasian female, who was seen in the office on March 28, 2003, at 55 +  [redacted] weeks gestation with known gestational diabetes mellitus, diet-controlled,  for whom a plan was made for induction due to her post-dates status and her  gestational diabetes mellitus.  In the office the patient's cervix was noted  to be closed.  A biophysical profile scored 8 out of 8, and the amniotic  fluid index measured 9.4.  The patient was admitted on the evening of March 28, 2003, with irregular contractions.  A Cervidil was placed and removed 11  minutes later due to recurrent late fetal heart  rate decelerations.  The  patient received oxygen, an IV fluid bolus, and positional changes.  I was  called to evaluate the patient at this time regarding the late fetal heart  rate decelerations.  The patient's cervix was 3 cm dilated, 50% effaced, and  with a vertex at the -3 station.  Artificial rupture of membranes was  performed, and clear fluid was noted.  Both a fetal scalp electrode and an  IUPC were placed.  The patient was watched at the bedside during the  immediate time period following this, and the fetal heart rate again  developed late decelerations with decreased beat-to-beat variability and the  absence of scalp stimulation with an internal examination.  The patient was  noted to have adequate [place] units of approximately 200.  A discussion  was held with the patient at this time regarding the nonreassuring fetal  assessment, and a recommendation  was made to proceed with a primary low  segment transverse cesarean section.  The patient voiced her desire for  permanent sterilization, which had been previously discussed during her  antepartum course.  Risks, benefits, and alternatives to the cesarean  section and the tubal ligation have been discussed with the patient.  This  did include discussion of the failure rate of the tubal ligation, which may  result in either an intrauterine or ectopic pregnancy.  The patient and her  husband chose to proceed.   FINDINGS:  A viable female was delivered at 1:24 a.m.  A triple nuchal cord,  which was tight in two of the loops, was noted.  The Apgars were 9 at one  minute and 9 at five minutes.  The weight was later noted to be 7 pounds 1  ounce in the newborn nursery.  The placenta had a normal insertion of a  three-vessel cord, which was noted to be very long and thin with very little  Wharton's jelly.  The fallopian tubes, ovaries, and uterus were normal.  The  cord pH was noted to be 7.26.   SPECIMENS:  The placenta was sent  to pathology.   PROCEDURE:  With an IV in place, the patient was escorted from her labor and  delivery suite to the operating room, and she was again re-identified there.  The patient received a spinal anesthetic and was placed in the supine  position in the left lateral tilt.  The abdomen and vagina were prepped and  a Foley catheter sterilely placed inside the bladder.  The patient was then  sterilely draped.  Adequate anesthesia was ensured with an Allis test and  then a Pfannenstiel incision was created sharply with a scalpel.  This was  carried down to the fascia using monopolar cautery.  The fascia was then  scored in the midline with a scalpel and the incision was extended  bilaterally with a Mayo scissors.  The rectus muscles were dissected off of  the overlying fascia superiorly and inferiorly, and the rectus muscles were  then divided in the midline down to the pubic symphysis.  The parietal  peritoneum was identified and grasped with a hemostat clamp.  It was  ultimately entered bluntly.  The incision was extended cranially and  caudally using the Metzenbaum scissors.   The lower uterine segment of the uterus was exposed at this time and a  transverse lower uterine segment incision was created sharply with a  scalpel.  Entry into the uterine cavity was performed and the incision was  then extended bluntly.  A hand was inserted through the incision and the  vertex was delivered without difficulty.  The triple nuchal cord was reduced  and the remainder of the infant was delivered and found to be vigorous.  The  cord was doubly clamped and cut and the newborn was carried over to the  awaiting pediatricians from the NICU.  The nares and mouth were suctioned  immediately.   The patient received Cefotetan 1 g IV.  The cord pH and the cord blood were  obtained, and the placenta was then manually extracted.  A moistened lap pad was used to remove any remaining membranes from within  the uterine cavity.  The patient did receive Pitocin 20 units intravenously.  The uterus was  exteriorized for its closure, which was performed as a double layer of #1  chromic.  The first layer was a running locked layer, and the second  layer  was an imbricating layer.  Additional bleeding along the right lower aspect  of the uterine incision was noted and a figure-of-eight stitch of #1 chromic  was placed for excellent hemostasis.   The tubal ligation was performed next.  The left fallopian tube was grasped  with a Babcock clamp and two free ties of 0 plain were placed such that a  knuckle of tissue was created in the isthmic portion of the fallopian tube.  The intervening portion of the fallopian tube was then sharply excised and  sent to pathology.  The right fallopian tube was then grasped with a Babcock  clamp after it was followed to its fimbriated end as was done on the right-  hand side.  A knuckle of tissue was created by placing two free ties of 0  chromic.  The intervening portion of tissue was excised with a Metzenbaum  scissors.  The specimen was sent to pathology separately.   Hemostasis as excellent at this time.  The uterus was returned to the  peritoneal cavity.  During this process, there was some bleeding noted along  the serosa of the broad ligament adjacent to the tubal ligation on the left-  hand side.  Monopolar cautery was used to attempt to create hemostasis where  this rent in the broad ligament was noted.  This was not adequate, and the  broad ligament just below the level of the tubal ligation and the tubal  ligation site was re-grasped and drawn upward, and one additional tie of 2-0  plain was placed entirely around this area, which created excellent  hemostasis.   Operative sites were found to be hemostatic at this time, and the abdomen  was closed.   The peritoneum was closed with a running suture of 3-0 Vicryl.  The fascia  was closed with a running  suture of 0 Vicryl.  The subcutaneous tissue was  irrigated and suctioned and made hemostatic with monopolar cautery.  The  skin was then closed with staples, then a pressure dressing was placed over  the incision after the abdomen was cleansed of Betadine.   The uterus was expressed of remaining clots.  The patient was  then escorted  to the recovery room in stable and awake condition.  There were no  complications.  All needle, instrument, and sponge counts were correct.                                               Randye Lobo, M.D.    BES/MEDQ  D:  03/29/2003  T:  03/29/2003  Job:  [id number]

## 2011-02-27 NOTE — Op Note (Signed)
[facility]  Patient:    Virginia Oneal, Virginia Oneal                        MRN: [id number] Proc. Date: 08/20/00 Adm. Date:  [id number] Attending:  Miguel Aschoff                           Operative Report  PREOPERATIVE DIAGNOSES:       1. Intrauterine gestation at 40 weeks.                               2. Preeclampsia.                               3. Moderate fetal heart rate decelerations.                               4. Maternal tachycardia.  POSTOPERATIVE DIAGNOSES:      1. Intrauterine gestation at 40 weeks.                               2. Preeclampsia.                               3. Moderate fetal heart rate decelerations.                               4. Maternal tachycardia.  PROCEDURE:                    Vacuum assisted vaginal delivery with episiotomy repair.  SURGEON:                      Brook A. Edward Jolly, M.D.  ESTIMATED BLOOD LOSS:         Less than 500 cc.  URINE OUTPUT:                 200 cc prior to procedure.  COMPLICATIONS:                None.  INDICATIONS FOR PROCEDURE:    The patient was a 45 year old gravida 2, para 0-0-1-0 Caucasian female admitted at [redacted] weeks gestation with contractions. The patients antepartum course had been significant for pregnancy induced hypertension.   Upon admission the patients blood pressure was noted to be 174/104.  The cervix was fingertip dilated.  The fetal heart rate tracing was reassuring.  The patient had contractions noted every four minutes.  The patient was noted to have peripheral edema of the lower extremities and she had evidence of hyperreflexia.  Preeclamptic laboratories were evaluated and the platelets were noted to be 121,000 and a urinalysis documented 30 mg/dl of protein.  The remainder of preeclamptic laboratories were unremarkable.  The patient was begun on magnesium sulfate for seizure prophylaxis.  She rapidly progressed to complete cervical dilatation.  The patient began pushing  and was noted to have some mild variables with good return to baseline.  The patient did receive Stadol 1 mg IV plus Phenergan 12.5 mg IV for discomfort during the pushing stage.  The  patient went on to bring the fetal vertex down to a +3 station with pushing at which time the variables were noted to become moderate with a slow return to baseline.  The patient also developed maternal tachycardia at a rate of approximately 135.  There was no evidence of fever. Decision was made at this time upon the recommendation of the obstetrician to proceed with a vacuum assisted vaginal delivery.  FINDINGS:                     A viable female infant was born at 12:51 with Apgars of 8 at one minute and 9 at five minutes.  The newborn weight was 8 pounds.  A midline episiotomy was performed.  There was a left hymenal laceration and a periurethral abrasion.  The placenta delivered spontaneously and had a normal insertion of a three vessel cord.  Please note that there was a nuchal cord x 1 which was reduced at delivery.  PROCEDURE:                    With an IV in place and with the patient receiving magnesium sulfate as seizure prophylaxis, the patients perineum was sterilely prepped and the urinary bladder was sterilely catheterized of urine. The patient was then sterilely draped.  The perineum was injected with 1% lidocaine.  An episiotomy was performed.  The Mityvac was placed over the fetal vertex in the occiput anterior position with the vertex at the +3 station.  Care was taken to avoid the cervix and vagina during placement.  The Mityvac was used during two contractions and the fetal vertex was delivered without difficulty.  The nares and mouth were then suctioned of clear fluid and the remainder of the infant was delivered without difficulty.  The infant had a spontaneous cry and was moving all extremities spontaneously.  The cord was then clamped and cut.  The infant was taken to the warmer and  was reassessed.  There was some mild grunting noted and the newborn was therefore taken to the nursery for observation.                                The placenta delivered spontaneously.  The patient did receive Pitocin 20 units intravenously.  She had good contraction of the uterus.  The episiotomy was repaired in standard with 2-0 and 0 Vicryl. The left hymenal laceration was repaired separately with a running locked suture of 2-0 Vicryl.  The cervix and vagina were reexamined and there were no other lacerations appreciated.                                The patient was cleansed of any remaining Betadine.  Her blood pressure at this time was noted to be 182/90.  She was therefore treated with labetalol 10 mg IV.                                There were no complications to the procedure. All needle, instrument, and sponge counts were correct. DD:  08/20/00 TD:  08/20/00 Job: [id number]

## 2011-02-27 NOTE — Discharge Summary (Signed)
Virginia Oneal, Virginia Oneal                             ACCOUNT NO.:  [id number]   MEDICAL RECORD NO.:  [id number]                   PATIENT TYPE:   LOCATION:                                       FACILITY:   PHYSICIAN:  Malva Limes, M.D.                 DATE OF BIRTH:   DATE OF ADMISSION:  03/28/2003  DATE OF DISCHARGE:  04/01/2003                                 DISCHARGE SUMMARY   FINAL DIAGNOSES:  1. Intrauterine pregnancy at 40 and 6/7th weeks gestation.  2. Gestational diabetes mellitus, diet controlled.  3. Nonreassuring fetal assessment.  4. Gestational thrombocytopenia.  5. Desire for permanent sterilization.   PROCEDURE:  Primary low transverse cesarean section and bilateral tubal  ligation.   SURGEON:  Randye Lobo, M.D.   CONSULTATIONS:  None.   HISTORY AND HOSPITAL COURSE:  This is a 45 year old, G2, P1, at 001 who was  seen in office and 40 and 5/7th week gestation with known gestational  diabetes mellitus which was diet controlled.  The patient had a reactive  nonstress test, and a plan was made for an induction for her post-date  status.  Upon this visit in the office, the patient's cervix was still  slowed.  She had BPP of 8/8.  The patient's antepartum course had been  complicated by advanced maternal age.  She did have an amniocentesis  performed showing a normal female karyotype.  The patient did have  gestational diabetes mellitus.  She had a history of postpartum depression  and a history of preeclampsia with her last pregnancy.  Otherwise, the  patient's antepartum course was uncomplicated.   Upon admission to the hospital, Cervidil was placed and removed 11 minutes  later secondary to some fetal heart decelerations.  The patient was given  oxygen, fluid, and position changes.  At this point, the patient's cervix  was about 3 cm dilated and negative 3 station.  Artificial rupture of  membranes was performed with clear fluid, and IUPC's were placed.   Following  this, the fetal heart rate again developed some late decelerations, and a  discussion was held with this patient, this time regarding her nonreassuring  fetal assessment. At this time, a decision was made to proceed with a  primary low transverse cesarean section, and the patient still expressed her  desires for permanent sterilization.  The patient was taken to the operating  room on March 29, 2003 by Dr. Randye Lobo, where a primary low transverse  cesarean section was performed.  The patient delivered a 7 pound, 1 ounce  female infant with Apgars of 9/9.  There was a nuchal cord x3 noted.  Delivery went without complications.   The postoperative course was benign without significant fevers.  She was  felt ready for discharged on postoperative day #2.   DIET:  The patient was sent home on a regular diet.  ACTIVITY:  Told to decreased activity.   MEDICATIONS:  She was told to continue prenatal vitamins, and then per  morphine sulfate, and was given a prescription for Tylox 1-2 every 4 hours  as needed for pain, and was to follow up in the office in four weeks.   LABORATORIES ON DISCHARGE:  The patient had a hemoglobin of 8.6, a white  blood cell count of 10.0, and a platelet count of 101,000.     Virginia Oneal, P.A.-C.                Malva Limes, M.D.    MB/MEDQ  D:  04/29/2003  T:  04/30/2003  Job:  [id number]

## 2011-02-27 NOTE — Letter (Signed)
September 17, 2006    Erline Hau, MD  [facility]   RE:  Virginia Oneal  MRN:  [id number]  /  DOB:  11/13/1965   Dear Virginia Oneal,   This is to introduce Virginia Oneal, a 45 year old, white female who is  followed today for ear congestion.  She has had scarring of her right  ear from the past, which I have seen on my own.  Today, she has frank  tympanic membrane rupture of the entire posterior surface of the  tympanic membrane.  Typically, I would not refer her for a rupture, but  this one is so extensive that I was wondering if she might need some  kind of a repair.  It did not appear infected today and I did not put  her on antibiotics.  She is a very prudent lady and understands  treatment.  She is doing well.  I have seen her in the past for high  blood pressure.  Today, it is excellent at 120/80 on no medications and  she is monitoring it at home as well.  She has no known allergies.  She  is not on any medicines.  She does have migraine headaches and takes  Imitrex for that but has not used it recently.  Has had a kidney stone  in the past and has had polyps on her colonoscopy.  Otherwise, she is  reasonably healthy and has 2 adorable daughters.  I appreciate your  seeing her and I will look forward to your evaluation.    Sincerely,      Arta Silence, MD  Electronically Signed    RNS/MedQ  DD: 09/17/2006  DT: 09/18/2006  Job #: [id number]

## 2011-02-27 NOTE — Op Note (Signed)
[name], [name]                 ACCOUNT NO.:  [id number]   MEDICAL RECORD NO.:  [id number]          PATIENT TYPE:  AMB   LOCATION:  SDC                           FACILITY:  [facility]   PHYSICIAN:  Randye Lobo, M.D.   DATE OF BIRTH:  [birth year]   DATE OF PROCEDURE:  08/28/2005  DATE OF DISCHARGE:                                 OPERATIVE REPORT   PREOPERATIVE DIAGNOSIS:  Menorrhagia.   POSTOPERATIVE DIAGNOSIS:  Menorrhagia.   PROCEDURE:  Hysteroscopy, dilation and curettage, NovaSure endometrial  ablation.   SURGEON:  Conley Simmonds, MD.   ANESTHESIA:  MAC.   IV FLUIDS:  1100 mL Ringer's lactate.   ESTIMATED BLOOD LOSS:  Minimal.   URINE OUTPUT:  I&O catheterization prior to procedure.   COMPLICATIONS:  None.   INDICATIONS FOR THE PROCEDURE:  The patient is a 45 year old gravida 3, para  2-0-1-2 Caucasian female, status post bilateral tubal ligation who presents  with a history of having heavy monthly menses.  The patient reports using  pads and tampons simultaneously with necessity to change them every 1 hour.  The patient has also reported staining through clothing due to the heavy  bleeding.  She additionally reports some dysmenorrhea.  The patient had an  endometrial biopsy on July 20, 2005, documenting proliferative endometrium  with no evidence of hyperplasia, nor malignancy.  An ultrasound on July 20, 2005, documented a normal uterus with an endometrial stripe measuring  0.68 cm.  The ovaries were normal.  The patient declined medical therapy,  and she also declined hysterectomy and she wished to proceed with an  ablation procedure with hysteroscopy and D&C after risks, benefits, and  alternatives were reviewed.   FINDINGS:  Exam under anesthesia revealed a small, retroverted, mobile  uterus.  No adnexal masses were appreciated.   Hysteroscopy demonstrated some fluffy endometrium in the lower uterine  segment.  The fundal portion of the uterine cavity  demonstrated no lesions,  and both tubal ostia were visualized well.   PROCEDURE:  The patient was reidentified in the preoperative hold area.  She  was brought down to the operating room after she did receive 1 g of Ancef  IV.  The patient received MAC anesthesia and was then placed in the dorsal  lithotomy position.  The perineum and vagina were then sterilely prepped,  and the bladder was I&O catheterized of urine.  The patient was then  sterilely draped.   An exam under anesthesia was performed.  A speculum was placed in the  vagina, and a single-tooth tenaculum was placed on the anterior cervical  lip.  A paracervical block was performed with 10 mL of lidocaine in standard  fashion.  The uterus was then sounded to 10 cm.  The Hagar dilator then  measured a cervical length of 4 cm which left a cavity length of 6 cm.  The  diagnostic hysteroscope was inserted under the continuous infusion of saline  solution, and the findings are as noted above.  Sharp curettage in all 4  quadrants of the uterine cavity was performed, and  the endometrial  curettings were sent to pathology. The cervix was then sequentially dilated  to a #25 Pratt dilator.  The NovaSure device was then inserted to the level  of the uterine fundus and withdrawn slightly.  Appropriate measures were  taken to demonstrate the cavity width of 4.5 cm.  The obturator of the  NovaSure device was then placed against the cervix, and the CO2 test was  performed and the patient passed the test, indicating no uterine  perforation.  The NovaSure ablation was then performed with a power setting  of 149 watts for a total of 55 seconds.  The device was then removed from  the uterine cavity without difficulty.  The single-tooth tenaculum was  removed.  A ring forceps was used to apply pressure to one of the tenaculum  sites which was oozing only slightly.  This provided good hemostasis after  it was removed.   This concluded the  patient's procedure.  There were no complications.  All  needle, instrument, and sponge counts were correct.  The patient is escorted  to the recovery room in stable and awake condition.      Randye Lobo, M.D.  Electronically Signed     BES/MEDQ  D:  08/28/2005  T:  08/28/2005  Job:  [id number]

## 2011-06-29 ENCOUNTER — Ambulatory Visit (INDEPENDENT_AMBULATORY_CARE_PROVIDER_SITE_OTHER): Payer: 59 | Admitting: Family Medicine

## 2011-06-29 ENCOUNTER — Encounter: Payer: Self-pay | Admitting: Family Medicine

## 2011-06-29 VITALS — BP 130/90 | HR 70 | Temp 98.6°F | Wt 223.8 lb

## 2011-06-29 DIAGNOSIS — M7580 Other shoulder lesions, unspecified shoulder: Secondary | ICD-10-CM

## 2011-06-29 DIAGNOSIS — Z23 Encounter for immunization: Secondary | ICD-10-CM

## 2011-06-29 DIAGNOSIS — F419 Anxiety disorder, unspecified: Secondary | ICD-10-CM | POA: Insufficient documentation

## 2011-06-29 DIAGNOSIS — M67919 Unspecified disorder of synovium and tendon, unspecified shoulder: Secondary | ICD-10-CM

## 2011-06-29 DIAGNOSIS — F411 Generalized anxiety disorder: Secondary | ICD-10-CM

## 2011-06-29 HISTORY — DX: Other shoulder lesions, unspecified shoulder: M75.80

## 2011-06-29 MED ORDER — BUSPIRONE HCL 15 MG PO TABS: ORAL_TABLET | ORAL | Status: DC

## 2011-06-29 NOTE — Progress Notes (Signed)
45 yo to follow up right shoulder pain.   Shoulder hurting for past two months.  Saw her in April.    Naprosyn and icy/hot were not helping much.  She fell at church in January and is not sure if she landed on that side.  No other known injury. Probable rotator cuff tendonopathy, referred to ortho. Pt finished PT and feels much better. Sometimes has similar symptoms in left shoulder.  Anxiety- Past several months, feels more anxious. Sometimes she can't sleep but most of her symptoms occur during the day. Has had panic attacks in large crowds but she can usually just walk away, take deep breaths and she feels much better. A little tearful at times. No SI or HI. Appetite is ok. Enjoying her children and her family.  Review of Systems        per HPI  Physical Exam BP 130/90  Pulse 70  Temp(Src) 98.6 F (37 C) (Oral)  Wt 223 lb 12 oz (101.492 kg)  General:  overweight but generally well appearing  Msk:  neg spurling.  neck FROM.   no deformity at shoulders bilaterally. Pulses:  2+ rad pulses Neurologic:  sensation intact Psych:  Tearful and mildly anxious, appropriate  Assessment and Plan: 1. Rotator cuff tendinitis   Improved.  Continue exercises and stretches that she learned at PT.   2. Anxiety   New.  Will start Buspar 7. 5 mg twice daily. Deferred psychotherapy at this time. Follow up in one month.

## 2011-06-29 NOTE — Patient Instructions (Signed)
What is this medicine? BUSPIRONE (byoo SPYE rone) is used to treat anxiety disorders. This medicine may be used for other purposes; ask your health care provider or pharmacist if you have questions.   What should I tell my health care provider before I take this medicine? They need to know if you have any of these conditions: -kidney or liver disease -an unusual or allergic reaction to buspirone, other medicines, foods, dyes, or preservatives -pregnant or trying to get pregnant -breast-feeding   How should I use this medicine? Take this medicine by mouth with a glass of water. Follow the directions on the prescription label. You may take this medicine with or without food. To ensure that this medicine always works the same way for you, you should take it either always with or always without food. Take your doses at regular intervals. Do not take your medicine more often than directed. Do not stop taking except on the advice of your doctor or health care professional.   Talk to your pediatrician regarding the use of this medicine in children. Special care may be needed.   Overdosage: If you think you have taken too much of this medicine contact a poison control center or emergency room at once. NOTE: This medicine is only for you. Do not share this medicine with others.   What if I miss a dose? If you miss a dose, take it as soon as you can. If it is almost time for your next dose, take only that dose. Do not take double or extra doses.   What may interact with this medicine? Do not take this medicine with any of the following medications: -linezolid -MAOIs like Carbex, Eldepryl, Marplan, Nardil, and Parnate -methylene blue -procarbazine This medicine may also interact with the following medications: -diazepam -digoxin -diltiazem -erythromycin -grapefruit juice -haloperidol -medicines for mental depression or mood problems -medicines for seizures like carbamazepine,  phenobarbital and phenytoin -nefazodone -other medications for anxiety -rifampin -ritonavir -some antifungal medicines like itraconazole, ketoconazole, and voriconazole -verapamil -warfarin This list may not describe all possible interactions. Give your health care provider a list of all the medicines, herbs, non-prescription drugs, or dietary supplements you use. Also tell them if you smoke, drink alcohol, or use illegal drugs. Some items may interact with your medicine.   What should I watch for while using this medicine? Visit your doctor or health care professional for regular checks on your progress. It may take 1 to 2 weeks before your anxiety gets better.   You may get drowsy or dizzy. Do not drive, use machinery, or do anything that needs mental alertness until you know how this drug affects you. Do not stand or sit up quickly, especially if you are an older patient. This reduces the risk of dizzy or fainting spells. Alcohol can make you more drowsy and dizzy. Avoid alcoholic drinks.   What side effects may I notice from receiving this medicine? Side effects that you should report to your doctor or health care professional as soon as possible: -blurred vision or other vision changes -chest pain -confusion -difficulty breathing -feelings of hostility or anger -muscle aches and pains -numbness or tingling in hands or feet -ringing in the ears -skin rash and itching -vomiting -weakness   Side effects that usually do not require medical attention (report to your doctor or health care professional if they continue or are bothersome): -disturbed dreams, nightmares -headache -nausea -restlessness or nervousness -sore throat and nasal congestion -stomach upset   This  list may not describe all possible side effects. Call your doctor for medical advice about side effects. You may report side effects to FDA at 1-800-FDA-1088.   Where should I keep my medicine? Keep out of the  reach of children.   Store at room temperature below 30 degrees C (86 degrees F). Protect from light. Keep container tightly closed. Throw away any unused medicine after the expiration date.   NOTE:This sheet is a summary. It may not cover all possible information. If you have questions about this medicine, talk to your doctor, pharmacist, or health care provider.      2011, [employer] Standard.

## 2011-07-07 ENCOUNTER — Telehealth: Payer: Self-pay | Admitting: Family Medicine

## 2011-07-07 NOTE — Telephone Encounter (Signed)
Patient called in and said that Dr. Milinda Antis started her on a new medicine and that she's been on it about a week and it makes her dizzy.  She would like to discuss with nurse.  Please call patient at [phone].

## 2011-07-07 NOTE — Telephone Encounter (Signed)
Patient advised as instructed via telephone.  Scheduled her to see Dr. Dayton Martes on 07/08/2011 at 8:00am.

## 2011-07-07 NOTE — Telephone Encounter (Signed)
It looks like pt saw Dr Dayton Martes on 06/29/11 so I will forward to [facility].

## 2011-07-07 NOTE — Telephone Encounter (Signed)
It takes time to adjust to any of these medications- a month to get full benefit. Has she tried cutting the tablet in half to minimize side effects? We can try something like celexa, prozac, paxil or zoloft but they also have side effects and it will take time to adjust.

## 2011-07-07 NOTE — Telephone Encounter (Signed)
Patient stated that she started Buspar over a week ago and it's not agreeing with her.  She feels feels lightheaded, has headaches, and sweats while on this medication and she also feels like she is having panic attacks.  She wanted to know if there is anything she can take when she has a panic attack that she doesn't have to take daily.  Please advise.  Uses [facility].

## 2011-07-08 ENCOUNTER — Encounter: Payer: Self-pay | Admitting: Family Medicine

## 2011-07-08 ENCOUNTER — Ambulatory Visit (INDEPENDENT_AMBULATORY_CARE_PROVIDER_SITE_OTHER): Payer: 59 | Admitting: Family Medicine

## 2011-07-08 VITALS — BP 120/90 | HR 78 | Temp 98.7°F | Wt 223.5 lb

## 2011-07-08 DIAGNOSIS — F411 Generalized anxiety disorder: Secondary | ICD-10-CM

## 2011-07-08 DIAGNOSIS — F419 Anxiety disorder, unspecified: Secondary | ICD-10-CM

## 2011-07-08 MED ORDER — LORAZEPAM 0.5 MG PO TABS: 0.5000 mg | ORAL_TABLET | Freq: Three times a day (TID) | ORAL | Status: AC | PRN

## 2011-07-08 NOTE — Patient Instructions (Signed)
Good to see you. Please call us in a few a days with an update of your symptoms.

## 2011-07-08 NOTE — Progress Notes (Signed)
45 yo here to follow up anxiety.  Anxiety- Past several months, feels more anxious. Sometimes she can't sleep but most of her symptoms occur during the day. Has had panic attacks in large crowds but she can usually just walk away, take deep breaths and she feels much better.  No SI or HI. Appetite is ok. Started Buspar 7.5 mg twice daily on 9/17 and she felt her symptoms became worse. Having more panic attacks.  Yesterday, felt very anxious for almost 2 hours.  No SOB or CP just felt "like she could crawl out of her skin." No nausea, vomiting, diarrhea or abdominal pain.     Patient Active Problem List  Diagnoses  . PREMATURE ATRIAL CONTRACTIONS  . RASH AND OTHER NONSPECIFIC SKIN ERUPTION  . RENAL CALCULUS, HX OF  . DIABETES MELLITUS, GESTATIONAL, HX OF  . Right shoulder pain  . Rotator cuff tendinitis  . Anxiety   Past Medical History  Diagnosis Date  . Kidney stones   . Depression    Past Surgical History  Procedure Date  . Cesarean section 03/2003  . Vaginal hysterectomy 05/21/2009    lap assisted  . Laparoscopic endometriosis fulguration     ablation  . Incontinence surgery     sling for urinary incontinence   History  Substance Use Topics  . Smoking status: Former Games developer  . Smokeless tobacco: Not on file  . Alcohol Use: No   Family History  Problem Relation Age of Onset  . Kidney disease Mother     renal failure  . Stroke Mother   . Diabetes Mother   . Cancer Mother     colon  . Stroke Father   . Hypertension Brother   . Cancer Maternal Aunt     colon  . Hypertension Sister   . Hypertension Sister   . Hypertension Sister    No Known Allergies Current Outpatient Prescriptions on File Prior to Visit  Medication Sig Dispense Refill  . busPIRone (BUSPAR) 15 MG tablet 1/2 tab by mouth every morning and 1/2 tab by mouth every evening.  30 tablet  0  . estrogen-methylTESTOSTERone (ESTRATEST) 1.25-2.5 MG per tablet Take 1 tablet by mouth daily.        .  naproxen (NAPROSYN) 500 MG tablet Take 500 mg by mouth daily as needed.         The PMH, PSH, Social History, Family History, Medications, and allergies have been reviewed in [facility], and have been updated if relevant.  Review of Systems        per HPI  Physical Exam BP 120/90  Pulse 78  Temp(Src) 98.7 F (37.1 C) (Oral)  Wt 223 lb 8 oz (101.379 kg)  General:  overweight but generally well appearing  Msk:  neg spurling.  neck FROM.   no deformity at shoulders bilaterally. Pulses:  2+ rad pulses Neurologic:  sensation intact Psych:  Tearful and mildly anxious, appropriate  Assessment and Plan:  1. Anxiety    Deteriorated. >25 min spent with face to face with patient, >50% counseling and/or coordinating care. Stop buspar immediately- added to allergy list as an intolerance. She does not want to take any medication daily. Will give rx for lorazepam to use as needed panic attacks, continue counseling at her church.  She is declining other psychotherapy at this time.

## 2011-08-22 ENCOUNTER — Other Ambulatory Visit: Payer: Self-pay | Admitting: Family Medicine

## 2011-08-24 ENCOUNTER — Telehealth: Payer: Self-pay | Admitting: *Deleted

## 2011-08-24 NOTE — Telephone Encounter (Signed)
Pt called to say that we refilled her buspar this morning and she no longer takes this.  I advised her that we would not refill meds without a request because we dont know when our patients need refills, unless they or the pharmacy tells [name].  I advised her that if she is on an automatic refill system at her pharmacy they will request the refills.  She then said but there were no refills on it, I told her that's why the pharmacy put in the request. I advised her to let her pharmacy know that she no longer takes this and to take it off of automatic refill.

## 2011-08-24 NOTE — Telephone Encounter (Signed)
Rx refilled electronically by Dr. Aron. 

## 2012-05-16 ENCOUNTER — Encounter: Payer: Self-pay | Admitting: Internal Medicine

## 2012-06-22 ENCOUNTER — Encounter: Payer: Self-pay | Admitting: Internal Medicine

## 2012-07-21 ENCOUNTER — Ambulatory Visit (INDEPENDENT_AMBULATORY_CARE_PROVIDER_SITE_OTHER): Payer: 59 | Admitting: Family Medicine

## 2012-07-21 ENCOUNTER — Encounter: Payer: Self-pay | Admitting: Family Medicine

## 2012-07-21 VITALS — BP 124/82 | HR 72 | Temp 97.7°F | Wt 213.0 lb

## 2012-07-21 DIAGNOSIS — F411 Generalized anxiety disorder: Secondary | ICD-10-CM

## 2012-07-21 DIAGNOSIS — F419 Anxiety disorder, unspecified: Secondary | ICD-10-CM

## 2012-07-21 DIAGNOSIS — Z23 Encounter for immunization: Secondary | ICD-10-CM

## 2012-07-21 MED ORDER — LORAZEPAM 0.5 MG PO TABS: 0.5000 mg | ORAL_TABLET | Freq: Three times a day (TID) | ORAL | Status: DC | PRN

## 2012-07-21 NOTE — Patient Instructions (Addendum)
Good to see you. You are doing really well.

## 2012-07-21 NOTE — Addendum Note (Signed)
Addended by: Eliezer Bottom on: 07/21/2012 08:27 AM   Modules accepted: Orders

## 2012-07-21 NOTE — Progress Notes (Signed)
46 yo here for follow up anxiety.    Anxiety- Feeling much better with as needed ativan.  Takes it once every month or two when she feels very anxious. She feels like having the bottle in her purse helps with anxiety more than anything else.   No SI or HI. Appetite is ok. Tried Buspar last year but it made her feel worse, she couldn't tolerate it.       Patient Active Problem List  Diagnosis  . PREMATURE ATRIAL CONTRACTIONS  . RASH AND OTHER NONSPECIFIC SKIN ERUPTION  . RENAL CALCULUS, HX OF  . DIABETES MELLITUS, GESTATIONAL, HX OF  . Right shoulder pain  . Rotator cuff tendinitis  . Anxiety   Past Medical History  Diagnosis Date  . Kidney stones   . Depression    Past Surgical History  Procedure Date  . Cesarean section 03/2003  . Vaginal hysterectomy 05/21/2009    lap assisted  . Laparoscopic endometriosis fulguration     ablation  . Incontinence surgery     sling for urinary incontinence   History  Substance Use Topics  . Smoking status: Former Games developer  . Smokeless tobacco: Not on file  . Alcohol Use: No   Family History  Problem Relation Age of Onset  . Kidney disease Mother     renal failure  . Stroke Mother   . Diabetes Mother   . Cancer Mother     colon  . Stroke Father   . Hypertension Brother   . Cancer Maternal Aunt     colon  . Hypertension Sister   . Hypertension Sister   . Hypertension Sister    Allergies  Allergen Reactions  . Buspar (Buspirone Hcl)     Worsened her panic attacks   Current Outpatient Prescriptions on File Prior to Visit  Medication Sig Dispense Refill  . estrogen-methylTESTOSTERone (ESTRATEST) 1.25-2.5 MG per tablet Take 1 tablet by mouth daily.         The PMH, PSH, Social History, Family History, Medications, and allergies have been reviewed in [facility], and have been updated if relevant.  Review of Systems        per HPI  Physical Exam BP 124/82  Pulse 72  Temp 97.7 F (36.5 C)  Wt 213 lb (96.616  kg)  General:  overweight but generally well appearing  Msk:  neg spurling.  neck FROM.   no deformity at shoulders bilaterally. Pulses:  2+ rad pulses Neurologic:  sensation intact Psych:  Tearful and mildly anxious, appropriate  Assessment and Plan:  1. Anxiety   Deteriorated. >15 min spent with face to face with patient, >50% counseling and/or coordinating care. Refill Ativan, she seems to be doing quite well and she is using it very infrequently. Follow up as needed. The patient indicates understanding of these issues and agrees with the plan.

## 2012-08-02 ENCOUNTER — Ambulatory Visit (AMBULATORY_SURGERY_CENTER): Payer: 59

## 2012-08-02 ENCOUNTER — Encounter: Payer: Self-pay | Admitting: Internal Medicine

## 2012-08-02 VITALS — Ht 65.0 in | Wt 215.3 lb

## 2012-08-02 DIAGNOSIS — Z1211 Encounter for screening for malignant neoplasm of colon: Secondary | ICD-10-CM

## 2012-08-02 DIAGNOSIS — Z860100 Personal history of colon polyps, unspecified: Secondary | ICD-10-CM

## 2012-08-02 DIAGNOSIS — Z800 Family history of malignant neoplasm of digestive organs: Secondary | ICD-10-CM

## 2012-08-02 MED ORDER — NA SULFATE-K SULFATE-MG SULF 17.5-3.13-1.6 GM/177ML PO SOLN: 1.0000 | Freq: Once | ORAL | Status: DC

## 2012-08-16 ENCOUNTER — Encounter: Payer: 59 | Admitting: Internal Medicine

## 2012-12-06 ENCOUNTER — Encounter: Payer: Self-pay | Admitting: Internal Medicine

## 2012-12-28 ENCOUNTER — Encounter: Payer: Self-pay | Admitting: Obstetrics and Gynecology

## 2012-12-28 ENCOUNTER — Ambulatory Visit (INDEPENDENT_AMBULATORY_CARE_PROVIDER_SITE_OTHER): Payer: 59 | Admitting: Obstetrics and Gynecology

## 2012-12-28 VITALS — BP 130/74 | HR 60 | Ht 65.0 in | Wt 223.0 lb

## 2012-12-28 DIAGNOSIS — Z01419 Encounter for gynecological examination (general) (routine) without abnormal findings: Secondary | ICD-10-CM

## 2012-12-28 DIAGNOSIS — Z1231 Encounter for screening mammogram for malignant neoplasm of breast: Secondary | ICD-10-CM

## 2012-12-28 LAB — COMPREHENSIVE METABOLIC PANEL
Albumin: 4.2 g/dL (ref 3.5–5.2)
Alkaline Phosphatase: 83 U/L (ref 39–117)
BUN: 17 mg/dL (ref 6–23)
CO2: 26 mEq/L (ref 19–32)
Calcium: 9.5 mg/dL (ref 8.4–10.5)
Glucose, Bld: 93 mg/dL (ref 70–99)
Potassium: 4.6 mEq/L (ref 3.5–5.3)
Sodium: 138 mEq/L (ref 135–145)
Total Protein: 6.5 g/dL (ref 6.0–8.3)

## 2012-12-28 LAB — LIPID PANEL
Cholesterol: 180 mg/dL (ref 0–200)
LDL Cholesterol: 109 mg/dL — ABNORMAL HIGH (ref 0–99)
Triglycerides: 83 mg/dL (ref <150)
VLDL: 17 mg/dL (ref 0–40)

## 2012-12-28 LAB — POCT URINALYSIS DIPSTICK
Bilirubin, UA: NEGATIVE
Glucose, UA: NEGATIVE
Leukocytes, UA: NEGATIVE
Nitrite, UA: NEGATIVE
Urobilinogen, UA: NEGATIVE

## 2012-12-28 LAB — CBC
HCT: 39.2 % (ref 36.0–46.0)
Hemoglobin: 13 g/dL (ref 12.0–15.0)
MCHC: 33.2 g/dL (ref 30.0–36.0)
RBC: 4.43 MIL/uL (ref 3.87–5.11)

## 2012-12-28 NOTE — Progress Notes (Signed)
Patient ID: Virginia Oneal, female   DOB: 04-02-1966, 47 y.o.   MRN: [id number]  47 y.o. MarriedCaucasian female   [id number] here for annual exam.    No LMP recorded. Patient has had a hysterectomy.          Sexually active: yes  The current method of family planning is status post hysterectomy.    Exercising: no The patient does not participate in regular exercise at present.  function reports that she has never smoked. She does not have any smokeless tobacco history on file. She reports that she does not drink alcohol or use illicit drugs.  Health Maintenance  Topic Date Due  . Influenza Vaccine  06/12/1964  . Pap Smear  05/10/1982  . Tetanus/tdap  05/11/1983    Family History  Problem Relation Age of Onset  . Cancer - Colon Mother     deceased age 33  . Hypertension Mother   . Hypertension Sister   . Hypertension Brother   . Cancer - Colon Paternal Aunt     deceased    There is no problem list on file for this patient.   Past Medical History  Diagnosis Date  . Abnormal uterine bleeding   . Anxiety     postpartum and hx panic attacks  . Urinary incontinence 05/2009    hx bladder sling--Dr. Edward Jolly  . Hx of migraines     Past Surgical History  Procedure Laterality Date  . Colporrhaphy  05/2009    Dr. Edward Jolly  . Abdominal hysterectomy  05/2009    TAH/LSO  . Cesarean section  2004  . Bladder suspension  2010    Dr. Edward Jolly  . Endometrial ablation  2006    Dr. Edward Jolly  . Oophorectomy  2010    w/hyst--Dr. Edward Jolly  . Tubal ligation  2004    w/ C-section Dr. Edward Jolly    Allergies: Review of patient's allergies indicates no known allergies.  Current Outpatient Prescriptions  Medication Sig Dispense Refill  . LORazepam (ATIVAN) 0.5 MG tablet Take 0.5 mg by mouth as needed for anxiety.       No current facility-administered medications for this visit.    ROS: Pertinent items are noted in HPI.  Has some double voiding.  No incontinence with coughing, laughing, sneezing.  Reports  fatigue.  Exam:    BP 130/74  Pulse 60  Ht 5\' 5"  (1.651 m)  Wt 223 lb (101.152 kg)  BMI 37.11 kg/m2   Wt Readings from Last 3 Encounters:  12/28/12 223 lb (101.152 kg)     Ht Readings from Last 3 Encounters:  12/28/12 5\' 5"  (1.651 m)    General appearance: alert, cooperative and appears stated age Head: Normocephalic, without obvious abnormality, atraumatic Neck: no adenopathy, supple, symmetrical, trachea midline and thyroid not enlarged, symmetric, no tenderness/mass/nodules Lungs: clear to auscultation bilaterally Breasts: Inspection negative, No nipple retraction or dimpling, No nipple discharge or bleeding, No axillary or supraclavicular adenopathy, Normal to palpation without dominant masses Heart: regular rate and rhythm Abdomen: soft, non-tender; bowel sounds normal; no masses,  no organomegaly Extremities: extremities normal, atraumatic, no cyanosis or edema Skin: Skin color, texture, turgor normal. No rashes or lesions Lymph nodes: Cervical, supraclavicular, and axillary nodes normal. No abnormal inguinal nodes palpated Neurologic: Grossly normal   Pelvic: External genitalia:  no lesions              Urethra:  normal appearing urethra with no masses, tenderness or lesions  Bartholins and Skenes: normal                 Vagina: normal appearing vagina with normal color and discharge, no lesions              Cervix: normal appearance              Pap taken: no        Bimanual Exam:  Uterus:  uterus is normal size, shape, consistency and nontender                                      Adnexa: normal adnexa in size, nontender and no masses                                      Rectovaginal: Confirms                                      Anus:  normal sphincter tone, no lesions  A: well woman.  Family history of colon cancer in the patient's mother.     P: Pap smear Return annually or prn   Start exercise routine. Cholesterol panel, complete metabolic  profile, CBC, TSH.   An After Visit Summary was printed and given to the patient.

## 2012-12-28 NOTE — Patient Instructions (Addendum)
Please start a regular exercise and weight loss program.  Follow through with your coloscopy scheduled for April, 2014.    Continue self breast exams.  Health Maintenance, Females  A healthy lifestyle and preventative care can promote health and wellness.  Maintain regular health, dental, and eye exams.  Eat a healthy diet. Foods like vegetables, fruits, whole grains, low-fat dairy products, and lean protein foods contain the nutrients you need without too many calories. Decrease your intake of foods high in solid fats, added sugars, and salt. Get information about a proper diet from your caregiver, if necessary.  Regular physical exercise is one of the most important things you can do for your health. Most adults should get at least 150 minutes of moderate-intensity exercise (any activity that increases your heart rate and causes you to sweat) each week. In addition, most adults need muscle-strengthening exercises on 2 or more days a week.  Maintain a healthy weight. The body mass index (BMI) is a screening tool to identify possible weight problems. It provides an estimate of body fat based on height and weight. Your caregiver can help determine your BMI, and can help you achieve or maintain a healthy weight. For adults 20 years and older:  A BMI below 18.5 is considered underweight.  A BMI of 18.5 to 24.9 is normal.  A BMI of 25 to 29.9 is considered overweight.  A BMI of 30 and above is considered obese.  Maintain normal blood lipids and cholesterol by exercising and minimizing your intake of saturated fat. Eat a balanced diet with plenty of fruits and vegetables. Blood tests for lipids and cholesterol should begin at age 17 and be repeated every 5 years. If your lipid or cholesterol levels are high, you are over 50, or you are a high risk for heart disease, you may need your cholesterol levels checked more frequently. Ongoing high lipid and cholesterol levels should be treated with medicines if  diet and exercise are not effective.  If you smoke, find out from your caregiver how to quit. If you do not use tobacco, do not start.  If you are pregnant, do not drink alcohol. If you are breastfeeding, be very cautious about drinking alcohol. If you are not pregnant and choose to drink alcohol, do not exceed 1 drink per day. One drink is considered to be 12 ounces (355 mL) of beer, 5 ounces (148 mL) of wine, or 1.5 ounces (44 mL) of liquor.  Avoid use of street drugs. Do not share needles with anyone. Ask for help if you need support or instructions about stopping the use of drugs.  High blood pressure causes heart disease and increases the risk of stroke. Blood pressure should be checked at least every 1 to 2 years. Ongoing high blood pressure should be treated with medicines, if weight loss and exercise are not effective.  If you are 18 to 49 years old, ask your caregiver if you should take aspirin to prevent strokes.  Diabetes screening involves taking a blood sample to check your fasting blood sugar level. This should be done once every 3 years, after age 67, if you are within normal weight and without risk factors for diabetes. Testing should be considered at a younger age or be carried out more frequently if you are overweight and have at least 1 risk factor for diabetes.  Breast cancer screening is essential preventative care for women. You should practice "breast self-awareness." This means understanding the normal appearance and feel of  your breasts and may include breast self-examination. Any changes detected, no matter how small, should be reported to a caregiver. Women in their 62s and 30s should have a clinical breast exam (CBE) by a caregiver as part of a regular health exam every 1 to 3 years. After age 73, women should have a CBE every year. Starting at age 64, women should consider having a mammogram (breast X-ray) every year. Women who have a family history of breast cancer should talk to  their caregiver about genetic screening. Women at a high risk of breast cancer should talk to their caregiver about having an MRI and a mammogram every year.  The Pap test is a screening test for cervical cancer. Women should have a Pap test starting at age 66. Between ages 69 and 31, Pap tests should be repeated every 2 years. Beginning at age 40, you should have a Pap test every 3 years as long as the past 3 Pap tests have been normal. If you had a hysterectomy for a problem that was not cancer or a condition that could lead to cancer, then you no longer need Pap tests. If you are between ages 32 and 19, and you have had normal Pap tests going back 10 years, you no longer need Pap tests. If you have had past treatment for cervical cancer or a condition that could lead to cancer, you need Pap tests and screening for cancer for at least 20 years after your treatment. If Pap tests have been discontinued, risk factors (such as a new sexual partner) need to be reassessed to determine if screening should be resumed. Some women have medical problems that increase the chance of getting cervical cancer. In these cases, your caregiver may recommend more frequent screening and Pap tests.  The human papillomavirus (HPV) test is an additional test that may be used for cervical cancer screening. The HPV test looks for the virus that can cause the cell changes on the cervix. The cells collected during the Pap test can be tested for HPV. The HPV test could be used to screen women aged 32 years and older, and should be used in women of any age who have unclear Pap test results. After the age of 87, women should have HPV testing at the same frequency as a Pap test.  Colorectal cancer can be detected and often prevented. Most routine colorectal cancer screening begins at the age of 108 and continues through age 49. However, your caregiver may recommend screening at an earlier age if you have risk factors for colon cancer. On a  yearly basis, your caregiver may provide home test kits to check for hidden blood in the stool. Use of a small camera at the end of a tube, to directly examine the colon (sigmoidoscopy or colonoscopy), can detect the earliest forms of colorectal cancer. Talk to your caregiver about this at age 5, when routine screening begins. Direct examination of the colon should be repeated every 5 to 10 years through age 62, unless early forms of pre-cancerous polyps or small growths are found.  Hepatitis C blood testing is recommended for all people born from [birth year] through 1967 and any individual with known risks for hepatitis C.  Practice safe sex. Use condoms and avoid high-risk sexual practices to reduce the spread of sexually transmitted infections (STIs). Sexually active women aged 40 and younger should be checked for Chlamydia, which is a common sexually transmitted infection. Older women with new or multiple partners should  also be tested for Chlamydia. Testing for other STIs is recommended if you are sexually active and at increased risk.  Osteoporosis is a disease in which the bones lose minerals and strength with aging. This can result in serious bone fractures. The risk of osteoporosis can be identified using a bone density scan. Women ages 47 and over and women at risk for fractures or osteoporosis should discuss screening with their caregivers. Ask your caregiver whether you should be taking a calcium supplement or vitamin D to reduce the rate of osteoporosis.  Menopause can be associated with physical symptoms and risks. Hormone replacement therapy is available to decrease symptoms and risks. You should talk to your caregiver about whether hormone replacement therapy is right for you.  Use sunscreen with a sun protection factor (SPF) of 30 or greater. Apply sunscreen liberally and repeatedly throughout the day. You should seek shade when your shadow is shorter than you. Protect yourself by wearing long  sleeves, pants, a wide-brimmed hat, and sunglasses year round, whenever you are outdoors.  Notify your caregiver of new moles or changes in moles, especially if there is a change in shape or color. Also notify your caregiver if a mole is larger than the size of a pencil eraser.  Stay current with your immunizations. Document Released: 04/13/2011 Document Revised: 12/21/2011 Document Reviewed: 04/13/2011  [facility] Patient Information 2013 [place], [place].

## 2013-01-05 ENCOUNTER — Ambulatory Visit (AMBULATORY_SURGERY_CENTER): Payer: 59

## 2013-01-05 VITALS — Ht 65.0 in | Wt 225.0 lb

## 2013-01-05 DIAGNOSIS — Z86010 Personal history of colonic polyps: Secondary | ICD-10-CM

## 2013-01-05 DIAGNOSIS — R1013 Epigastric pain: Secondary | ICD-10-CM

## 2013-01-05 DIAGNOSIS — Z800 Family history of malignant neoplasm of digestive organs: Secondary | ICD-10-CM

## 2013-01-05 DIAGNOSIS — Z1211 Encounter for screening for malignant neoplasm of colon: Secondary | ICD-10-CM

## 2013-01-05 MED ORDER — NA SULFATE-K SULFATE-MG SULF 17.5-3.13-1.6 GM/177ML PO SOLN: 1.0000 | Freq: Once | ORAL | Status: DC

## 2013-01-06 ENCOUNTER — Encounter: Payer: Self-pay | Admitting: Internal Medicine

## 2013-01-18 ENCOUNTER — Encounter: Payer: Self-pay | Admitting: Internal Medicine

## 2013-01-18 ENCOUNTER — Ambulatory Visit (AMBULATORY_SURGERY_CENTER): Payer: 59 | Admitting: Internal Medicine

## 2013-01-18 VITALS — BP 130/78 | HR 70 | Temp 98.1°F | Resp 19 | Ht 65.0 in | Wt 225.0 lb

## 2013-01-18 DIAGNOSIS — Z860100 Personal history of colon polyps, unspecified: Secondary | ICD-10-CM

## 2013-01-18 DIAGNOSIS — Z1211 Encounter for screening for malignant neoplasm of colon: Secondary | ICD-10-CM

## 2013-01-18 DIAGNOSIS — D126 Benign neoplasm of colon, unspecified: Secondary | ICD-10-CM

## 2013-01-18 DIAGNOSIS — Z800 Family history of malignant neoplasm of digestive organs: Secondary | ICD-10-CM

## 2013-01-18 MED ORDER — SODIUM CHLORIDE 0.9 % IV SOLN: 500.0000 mL | INTRAVENOUS | Status: DC

## 2013-01-18 NOTE — Progress Notes (Signed)
I attempted IV stick to right hand x1 unsuccessfully by Threasa Beards, RN.  #2 IV stick to right wrist successful by Threasa Beards, RN.  Once i finished taping the IV in place at right wrist, the pt said, "I feel sick".  I immediately lowered the HOB to flat supine position.  Fanned the pt, cool cloth to her forehead.  Within a couple of minutes, the pt said she felt much better.  No vomiting was noted. Maw

## 2013-01-18 NOTE — Progress Notes (Signed)
Lidocaine-40mg IV prior to Propofol InductionPropofol given over incremental dosages 

## 2013-01-18 NOTE — Progress Notes (Signed)
Patient did not experience any of the following events: a burn prior to discharge; a fall within the facility; wrong site/side/patient/procedure/implant event; or a hospital transfer or hospital admission upon discharge from the facility. (G8907) Patient did not have preoperative order for IV antibiotic SSI prophylaxis. (G8918)  

## 2013-01-18 NOTE — Op Note (Signed)
[facility] [address] [place], [address]   COLONOSCOPY PROCEDURE REPORT  PATIENT: Virginia Oneal, Virginia Oneal  MR#: [id number] BIRTHDATE: 1966/07/10 , 46  yrs. old GENDER: Female ENDOSCOPIST: Iva Boop, MD, [facility] REFERRED BY: PROCEDURE DATE:  01/18/2013 PROCEDURE:   Colonoscopy with snare polypectomy ASA CLASS:   Class II INDICATIONS:Screening and surveillance,personal history of colonic polyps and Patient's immediate family history of colon cancer. Mom in 62's and a maternal aunt MEDICATIONS: propofol (Diprivan) 250mg  IV, MAC sedation, administered by CRNA, and These medications were titrated to patient response per physician's verbal order  DESCRIPTION OF PROCEDURE:   After the risks benefits and alternatives of the procedure were thoroughly explained, informed consent was obtained.  A digital rectal exam revealed no abnormalities of the rectum.   The LB [id number]  endoscope was introduced through the anus and advanced to the cecum, which was identified by both the appendix and ileocecal valve. No adverse events experienced.   The quality of the prep was Suprep excellent The instrument was then slowly withdrawn as the colon was fully examined.      COLON FINDINGS: A polypoid shaped sessile polyp measuring 5 mm in size was found in the sigmoid colon.  A polypectomy was performed with a cold snare.  The resection was complete and the polyp tissue was completely retrieved.   The colon mucosa was otherwise normal. A right colon retroflexion was performed.  Retroflexed views revealed no abnormalities. The time to cecum=3 minutes 46 seconds. Withdrawal time=8 minutes 35 seconds.  The scope was withdrawn and the procedure completed. COMPLICATIONS: There were no complications.  ENDOSCOPIC IMPRESSION: 1.   Sessile polyp measuring 5 mm in size was found in the sigmoid colon; polypectomy was performed with a cold snare 2.   The colon mucosa was otherwise  normal - excellent prep  RECOMMENDATIONS: Repeat Colonoscopy in 5 years.   eSigned:  Iva Boop, MD, [facility] 01/18/2013 9:38 AM   cc: The Patient and Conley Simmonds, MD   PATIENT NAME:  Virginia Oneal, Virginia Oneal MR#: [id number]

## 2013-01-18 NOTE — Progress Notes (Signed)
Called to room to assist during endoscopic procedure.  Patient ID and intended procedure confirmed with present staff. Received instructions for my participation in the procedure from the performing physician.  

## 2013-01-18 NOTE — Patient Instructions (Addendum)
One tiny polyp was removed. The rest of the exam was fine with a great prep.  I will let you know pathology results and when to have another routine colonoscopy by mail.  Thank you for choosing me and [facility].  Iva Boop, MD, FACG  YOU HAD AN ENDOSCOPIC PROCEDURE TODAY AT THE [facility]: Refer to the procedure report that was given to you for any specific questions about what was found during the examination.  If the procedure report does not answer your questions, please call your gastroenterologist to clarify.  If you requested that your care partner not be given the details of your procedure findings, then the procedure report has been included in a sealed envelope for you to review at your convenience later.  YOU SHOULD EXPECT: Some feelings of bloating in the abdomen. Passage of more gas than usual.  Walking can help get rid of the air that was put into your GI tract during the procedure and reduce the bloating. If you had a lower endoscopy (such as a colonoscopy or flexible sigmoidoscopy) you may notice spotting of blood in your stool or on the toilet paper. If you underwent a bowel prep for your procedure, then you may not have a normal bowel movement for a few days.  DIET: Your first meal following the procedure should be a light meal and then it is ok to progress to your normal diet.  A half-sandwich or bowl of soup is an example of a good first meal.  Heavy or fried foods are harder to digest and may make you feel nauseous or bloated.  Likewise meals heavy in dairy and vegetables can cause extra gas to form and this can also increase the bloating.  Drink plenty of fluids but you should avoid alcoholic beverages for 24 hours.  ACTIVITY: Your care partner should take you home directly after the procedure.  You should plan to take it easy, moving slowly for the rest of the day.  You can resume normal activity the day after the procedure however you should NOT  DRIVE or use heavy machinery for 24 hours (because of the sedation medicines used during the test).    SYMPTOMS TO REPORT IMMEDIATELY: A gastroenterologist can be reached at any hour.  During normal business hours, 8:30 AM to 5:00 PM Monday through Friday, call [phone].  After hours and on weekends, please call the GI answering service at [phone] who will take a message and have the physician on call contact you.   Following lower endoscopy (colonoscopy or flexible sigmoidoscopy):  Excessive amounts of blood in the stool  Significant tenderness or worsening of abdominal pains  Swelling of the abdomen that is new, acute  Fever of 100F or higher  FOLLOW UP: If any biopsies were taken you will be contacted by phone or by letter within the next 1-3 weeks.  Call your gastroenterologist if you have not heard about the biopsies in 3 weeks.  Our staff will call the home number listed on your records the next business day following your procedure to check on you and address any questions or concerns that you may have at that time regarding the information given to you following your procedure. This is a courtesy call and so if there is no answer at the home number and we have not heard from you through the emergency physician on call, we will assume that you have returned to your regular daily activities without incident.  SIGNATURES/CONFIDENTIALITY: You and/or your care partner have signed paperwork which will be entered into your electronic medical record.  These signatures attest to the fact that that the information above on your After Visit Summary has been reviewed and is understood.  Full responsibility of the confidentiality of this discharge information lies with you and/or your care-partner.  Polyps-handout given  Repeat colonoscopy in 5 years

## 2013-01-19 ENCOUNTER — Telehealth: Payer: Self-pay | Admitting: *Deleted

## 2013-01-19 NOTE — Telephone Encounter (Signed)
Left message on number given in admitting to return our call if questions, problems, or concerns. ewm

## 2013-01-23 ENCOUNTER — Encounter: Payer: Self-pay | Admitting: Internal Medicine

## 2013-01-23 NOTE — Progress Notes (Signed)
Quick Note:  Hyperplastic polyp Repeat colonoscopy about 01/2018 - Fhx colon cancer ______

## 2013-02-15 ENCOUNTER — Ambulatory Visit
Admission: RE | Admit: 2013-02-15 | Discharge: 2013-02-15 | Disposition: A | Discharge status: Home / Self Care | Payer: 59 | Source: Ambulatory Visit | Attending: Obstetrics and Gynecology | Admitting: Obstetrics and Gynecology

## 2013-02-15 ENCOUNTER — Other Ambulatory Visit: Payer: Self-pay | Admitting: Obstetrics and Gynecology

## 2013-02-15 DIAGNOSIS — R928 Other abnormal and inconclusive findings on diagnostic imaging of breast: Secondary | ICD-10-CM

## 2013-02-15 DIAGNOSIS — Z1231 Encounter for screening mammogram for malignant neoplasm of breast: Secondary | ICD-10-CM

## 2013-03-01 ENCOUNTER — Ambulatory Visit
Admission: RE | Admit: 2013-03-01 | Discharge: 2013-03-01 | Disposition: A | Discharge status: Home / Self Care | Payer: 59 | Source: Ambulatory Visit | Attending: Obstetrics and Gynecology | Admitting: Obstetrics and Gynecology

## 2013-03-01 DIAGNOSIS — R928 Other abnormal and inconclusive findings on diagnostic imaging of breast: Secondary | ICD-10-CM

## 2013-08-17 ENCOUNTER — Other Ambulatory Visit: Payer: Self-pay

## 2013-08-23 ENCOUNTER — Ambulatory Visit (INDEPENDENT_AMBULATORY_CARE_PROVIDER_SITE_OTHER): Payer: 59

## 2013-08-23 DIAGNOSIS — Z23 Encounter for immunization: Secondary | ICD-10-CM

## 2013-10-23 ENCOUNTER — Ambulatory Visit (INDEPENDENT_AMBULATORY_CARE_PROVIDER_SITE_OTHER): Payer: 59 | Admitting: Internal Medicine

## 2013-10-23 ENCOUNTER — Telehealth: Payer: Self-pay

## 2013-10-23 ENCOUNTER — Encounter: Payer: Self-pay | Admitting: Internal Medicine

## 2013-10-23 VITALS — BP 142/96 | HR 84 | Temp 97.4°F | Wt 232.8 lb

## 2013-10-23 DIAGNOSIS — J069 Acute upper respiratory infection, unspecified: Secondary | ICD-10-CM

## 2013-10-23 DIAGNOSIS — F411 Generalized anxiety disorder: Secondary | ICD-10-CM

## 2013-10-23 DIAGNOSIS — M549 Dorsalgia, unspecified: Secondary | ICD-10-CM

## 2013-10-23 DIAGNOSIS — H6122 Impacted cerumen, left ear: Secondary | ICD-10-CM

## 2013-10-23 DIAGNOSIS — H6120 Impacted cerumen, unspecified ear: Secondary | ICD-10-CM

## 2013-10-23 DIAGNOSIS — B9789 Other viral agents as the cause of diseases classified elsewhere: Principal | ICD-10-CM

## 2013-10-23 LAB — COMPREHENSIVE METABOLIC PANEL
ALT: 22 U/L (ref 0–35)
AST: 22 U/L (ref 0–37)
Albumin: 3.7 g/dL (ref 3.5–5.2)
Alkaline Phosphatase: 72 U/L (ref 39–117)
BILIRUBIN TOTAL: 0.6 mg/dL (ref 0.3–1.2)
BUN: 11 mg/dL (ref 6–23)
CALCIUM: 8.8 mg/dL (ref 8.4–10.5)
CHLORIDE: 104 meq/L (ref 96–112)
CO2: 28 meq/L (ref 19–32)
CREATININE: 0.7 mg/dL (ref 0.4–1.2)
GFR: 88.49 mL/min (ref >60.00)
GLUCOSE: 105 mg/dL — AB (ref 70–99)
Potassium: 3.9 mEq/L (ref 3.5–5.1)
Sodium: 139 mEq/L (ref 135–145)
Total Protein: 7.1 g/dL (ref 6.0–8.3)

## 2013-10-23 MED ORDER — LORAZEPAM 0.5 MG PO TABS: 0.5000 mg | ORAL_TABLET | Freq: Three times a day (TID) | ORAL | Status: DC | PRN

## 2013-10-23 NOTE — Patient Instructions (Signed)
Viral Pharyngitis Viral pharyngitis is a viral infection that produces redness, pain, and swelling (inflammation) of the throat. It can spread from person to person (contagious). CAUSES Viral pharyngitis is caused by inhaling a large amount of certain germs called viruses. Many different viruses cause viral pharyngitis. SYMPTOMS Symptoms of viral pharyngitis include:  Sore throat.  Tiredness.  Stuffy nose.  Low-grade fever.  Congestion.  Cough. TREATMENT Treatment includes rest, drinking plenty of fluids, and the use of over-the-counter medication (approved by your caregiver). HOME CARE INSTRUCTIONS   Drink enough fluids to keep your urine clear or pale yellow.  Eat soft, cold foods such as ice cream, frozen ice pops, or gelatin dessert.  Gargle with warm salt water (1 tsp salt per 1 qt of water).  If over age 7, throat lozenges may be used safely.  Only take over-the-counter or prescription medicines for pain, discomfort, or fever as directed by your caregiver. Do not take aspirin. To help prevent spreading viral pharyngitis to others, avoid:  Mouth-to-mouth contact with others.  Sharing utensils for eating and drinking.  Coughing around others. SEEK MEDICAL CARE IF:   You are better in a few days, then become worse.  You have a fever or pain not helped by pain medicines.  There are any other changes that concern you. Document Released: 07/08/2005 Document Revised: 12/21/2011 Document Reviewed: 12/04/2010 ExitCare Patient Information 2014 [employer].  

## 2013-10-23 NOTE — Telephone Encounter (Signed)
Called in [place] per [facility] order

## 2013-10-23 NOTE — Progress Notes (Signed)
HPI  Pt presents to the clinic today with c/o cough, headache, left ear fullness. This started 1 day ago. The cough is unproductive. She also reports upper back pain. She has had the upper back pain x 2-3 months. It does seem to be worse with movement. She has not taken anything for the back pain.  She denies fever, chills or body aches. She has tried Robitussin DM and Mucinex. She has no history of allergies or asthma. She has not had sick contacts.  Additionally, she would like a refill of her ativan. She also c/o of stomach pain that radiates into her back after eating meals such as that of a cheeseburger. She reports that this is different than reflux. She thinks it may be her gallbladder. She would like to have it checked today.  Review of Systems      Past Medical History  Diagnosis Date  . Kidney stones   . Depression   . Abnormal uterine bleeding   . Anxiety     postpartum and hx panic attacks  . Urinary incontinence 05/2009    hx bladder sling--Dr. Quincy Simmonds  . Hx of migraines   . Personal history of colonic polyps     Family History  Problem Relation Age of Onset  . Kidney disease Mother     renal failure  . Stroke Mother   . Diabetes Mother   . Cancer Mother     colon  . Colon cancer Mother   . Cancer - Colon Mother     deceased age 39  . Hypertension Mother   . Stroke Father   . Cancer Maternal Aunt     colon  . Colon cancer Maternal Aunt   . Hypertension Sister   . Hypertension Brother   . Cancer - Colon Paternal Aunt     deceased  . Esophageal cancer Neg Hx   . Rectal cancer Neg Hx   . Stomach cancer Neg Hx     History   Social History  . Marital Status: Married    Spouse Name: N/A    Number of Children: 2  . Years of Education: N/A   Occupational History  . [employer]    Social History Main Topics  . Smoking status: Former Smoker    Types: Cigarettes    Quit date: 01/06/1999  . Smokeless tobacco: Never Used  . Alcohol Use: No  . Drug  Use: No  . Sexual Activity: Yes    Partners: Male    Birth Control/ Protection: Other-see comments, Surgical   Other Topics Concern  . Not on file   Social History Narrative   ** Merged History Encounter **        Allergies  Allergen Reactions  . Buspar [Buspirone Hcl]     Worsened her panic attacks     Constitutional: Positive headache, fatigue. Denies fever or abrupt weight changes.  HEENT:  Positive sore throat. Denies eye redness, eye pain, pressure behind the eyes, facial pain, nasal congestion, ringing in the ears, wax buildup, runny nose or bloody nose. Respiratory: Positive cough. Denies difficulty breathing or shortness of breath.  Cardiovascular: Denies chest pain, chest tightness, palpitations or swelling in the hands or feet.  Abdomen: Pt reports stomach pain. Denies reflux, nausea, vomiting, diarrhea or blood in the stool. MSK: Pt reports pain between the shoulder blades. Denies decrease in ROM, joint pain or swelling.  No other specific complaints in a complete review of systems (except as listed in HPI  above).  Objective:   BP 142/96  Pulse 84  Temp(Src) 97.4 F (36.3 C) (Oral)  Wt 232 lb 12 oz (105.575 kg)  SpO2 98% Wt Readings from Last 3 Encounters:  10/23/13 232 lb 12 oz (105.575 kg)  01/18/13 225 lb (102.059 kg)  01/05/13 225 lb (102.059 kg)     General: Appears her stated age, overweight but well developed, well nourished in NAD. HEENT: Head: normal shape and size; Eyes: sclera white, no icterus, conjunctiva pink, PERRLA and EOMs intact; Ears: Tm's gray and intact, normal light reflex (left cerumen impaction); Nose: mucosa pink and moist, septum midline; Throat/Mouth: + PND. Teeth present, mucosa erythematous and moist, no exudate noted, no lesions or ulcerations noted.  Neck: Mild cervical lymphadenopathy. Neck supple, trachea midline. No massses, lumps or thyromegaly present.  Cardiovascular: Normal rate and rhythm. S1,S2 noted.  No murmur, rubs  or gallops noted. No JVD or BLE edema. No carotid bruits noted. Pulmonary/Chest: Normal effort and positive vesicular breath sounds. No respiratory distress. No wheezes, rales or ronchi noted.  Abdomen: Soft, nontender, active BS. Kidney's, liver, spleen non palpable MSK: Muscles not tight to palpation, no pinpoint tenderness noted, normal flexion and extension of the back.    Assessment & Plan:   Upper Respiratory Infection, likely viral at this point:  Get some rest and drink plenty of water Do salt water gargles for the sore throat Can use Ibuprofen as needed for pain/fever  Left Cerumen Impaction:  Manually removed using cerumen spoon by this provider Procedure successful  Anxiety:  Ativan refilled today  Back pain:  This seems MSK to me She thinks it is her gallbladder I told her we would check a CMET today, if her Tbili or alk phos are elevated, we will consider abdominal ultrasound  RTC as needed or if symptoms persist or worsen.  RTC as needed or if symptoms persist.

## 2013-10-23 NOTE — Progress Notes (Signed)
Pre-visit discussion using our clinic review tool. No additional management support is needed unless otherwise documented below in the visit note.  

## 2013-10-25 ENCOUNTER — Telehealth: Payer: Self-pay | Admitting: Family Medicine

## 2013-10-25 ENCOUNTER — Ambulatory Visit (INDEPENDENT_AMBULATORY_CARE_PROVIDER_SITE_OTHER): Payer: 59 | Admitting: Family Medicine

## 2013-10-25 ENCOUNTER — Encounter: Payer: Self-pay | Admitting: Family Medicine

## 2013-10-25 VITALS — BP 118/76 | HR 92 | Temp 98.0°F | Ht 65.0 in | Wt 232.0 lb

## 2013-10-25 DIAGNOSIS — J029 Acute pharyngitis, unspecified: Secondary | ICD-10-CM

## 2013-10-25 DIAGNOSIS — J020 Streptococcal pharyngitis: Secondary | ICD-10-CM

## 2013-10-25 DIAGNOSIS — R509 Fever, unspecified: Secondary | ICD-10-CM

## 2013-10-25 LAB — POCT INFLUENZA A/B
INFLUENZA A, POC: NEGATIVE
Influenza B, POC: NEGATIVE

## 2013-10-25 LAB — POCT RAPID STREP A (OFFICE): RAPID STREP A SCREEN: POSITIVE — AB

## 2013-10-25 MED ORDER — AMOXICILLIN 500 MG PO CAPS: 1000.0000 mg | ORAL_CAPSULE | Freq: Two times a day (BID) | ORAL | Status: DC

## 2013-10-25 MED ORDER — FLUCONAZOLE 150 MG PO TABS: 150.0000 mg | ORAL_TABLET | Freq: Once | ORAL | Status: DC

## 2013-10-25 NOTE — Telephone Encounter (Signed)
Patient Information:  Caller Name: Denae  Phone: [phone]  Patient: Virginia Oneal, Virginia Oneal  Gender: Female  DOB: [birth year]  Age: 48 Years  PCP: Arnette Norris [facility])  Pregnant: No  Office Follow Up:  Does the office need to follow up with this patient?: Yes  Instructions For The Office: Please call  back to schedule urgent follow up appointment within 4 hours. Epic scheduling non operative at time of call.  RN Note:  Hysterectomy. Respirations 28 rpm. Agreed to be seen again. Epic non functioning for scheduling appointments verified by [employer].  Morey Hummingbird took pt information; will call patient back to schedule as soon as Epic scheduling becomes available.   Symptoms  Reason For Call & Symptoms: Severe sore throat.  Reports sudden onset of upper respiratory sympotms without fever.  Seen 10/23/13; diagnosed with viral URI with pharyngitis.  Frequent coughing.  Was told to call if fever.  Reports fever present daily since 10/23/13.  Reviewed Health History In EMR: Yes  Reviewed Medications In EMR: Yes  Reviewed Allergies In EMR: Yes  Reviewed Surgeries / Procedures: Yes  Date of Onset of Symptoms: 10/22/2013  Treatments Tried: Ibuprofen, gargling with warm salt water, Delsym  Treatments Tried Worked: No  Any Fever: Yes  Fever Taken: Oral  Fever Time Of Reading: 02:00:00  Fever Last Reading: 102.6 OB / GYN:  LMP: Unknown  Guideline(s) Used:  Sore Throat  Disposition Per Guideline:   Go to Office Now  Reason For Disposition Reached:   Difficulty breathing (per caller) but not severe  Advice Given:  For Relief of Sore Throat Pain:  Sip warm chicken broth or apple juice.  Suck on hard candy or a throat lozenge (over-the-counter).  Gargle warm salt water 3 times daily (1 teaspoon of salt in 8 oz or 240 ml of warm water).  Pain Medicines:  They are over-the-counter (OTC) pain drugs. You can buy them at the drugstore.  Ibuprofen (e.g., Motrin, Advil):  Take 400 mg (two  200 mg pills) by mouth every 6 hours.  Another choice is to take 600 mg (three 200 mg pills) by mouth every 8 hours.  The most you should take each day is 1,200 mg (six 200 mg pills), unless your doctor has told you to take more.  Soft Diet:   Cold drinks and milk shakes are especially good (Reason: swollen tonsils can make some foods hard to swallow).  Liquids:  Adequate liquid intake is important to prevent dehydration. Drink 6-8 glasses of water per day.  Contagiousness:   You can return to work or school after the fever is gone and you feel well enough to participate in normal activities. If your doctor determines that you have Strep throat, then you will need to take an antibiotic for 24 hours before you can return.  Expected Course:  Sore throats with viral illnesses usually last 3 or 4 days.  Call Back If:  Fever lasts longer than 3 days  You become worse.  Patient Will Follow Care Advice:  YES

## 2013-10-25 NOTE — Progress Notes (Signed)
Pre-visit discussion using our clinic review tool. No additional management support is needed unless otherwise documented below in the visit note.  

## 2013-10-25 NOTE — Progress Notes (Signed)
Date:  10/25/2013   Name:  Virginia Oneal   DOB:  12/15/1965   MRN:  [id number] Gender: female Age: 48 y.o.  Primary Physician:  Arnette Norris, MD   Chief Complaint: URI   Subjective:   History of Present Illness:  Virginia Oneal is a 48 y.o. very pleasant female patient who presents with the following:  Fever of 102 Throat is hurting.   Coughing, whole body hurts.  Neck hurts a little bit.  Ears bothering her.   Not working.   Also has had some URI preceding her throat hurting  Past Medical History, Surgical History, Social History, Family History, Problem List, Medications, and Allergies have been reviewed and updated if relevant.  Review of Systems: ROS: GEN: Acute illness details above GI: Tolerating PO intake GU: maintaining adequate hydration and urination Pulm: No SOB Interactive and getting along well at home.  Otherwise, ROS is as per the HPI.   Objective:   Physical Examination: BP 118/76  Pulse 92  Temp(Src) 98 F (36.7 C) (Oral)  Ht 5\' 5"  (1.651 m)  Wt 232 lb (105.235 kg)  BMI 38.61 kg/m2   Gen: WDWN, NAD; A & O x3, cooperative. Pleasant.Globally Non-toxic HEENT: Normocephalic and atraumatic. Throat: swollen tonsills without exudate R TM clear, L TM - good landmarks, No fluid present. rhinnorhea. No frontal or maxillary sinus T. MMM NECK: Anterior cervical  LAD is present - TTP CV: RRR, No M/G/R, cap refill <2 sec PULM: Breathing comfortably in no respiratory distress. no wheezing, crackles, rhonchi ABD: S,NT,ND,+BS. No HSM. No rebound. EXT: No c/c/e PSYCH: Friendly, good eye contact    Laboratory and Imaging Data: Results for orders placed in visit on 10/25/13  POCT INFLUENZA A/B      Result Value Range   Influenza A, POC Negative     Influenza B, POC Negative    POCT RAPID STREP A (OFFICE)      Result Value Range   Rapid Strep A Screen Positive (*) Negative     Assessment & Plan:    Streptococcal sore throat  Fever, unspecified  - Plan: POCT Influenza A/B, POCT rapid strep A  Sore throat - Plan: POCT rapid strep A  URI with strep on top of it. Supportive care  There are no Patient Instructions on file for this visit.  Orders Placed This Encounter  Procedures  . POCT Influenza A/B  . POCT rapid strep A    New medications, updates to list, dose adjustments: Meds ordered this encounter  Medications  . amoxicillin (AMOXIL) 500 MG capsule    Sig: Take 2 capsules (1,000 mg total) by mouth 2 (two) times daily.    Dispense:  40 capsule    Refill:  0  . fluconazole (DIFLUCAN) 150 MG tablet    Sig: Take 1 tablet (150 mg total) by mouth once.    Dispense:  1 tablet    Refill:  0    Signed,  Rahkeem Senft T. Jeniah Kishi, MD, [facility] [place] [address] Phone: [phone] Fax: [phone]    Medication List       This list is accurate as of: 10/25/13  2:08 PM.  Always use your most recent med list.               amoxicillin 500 MG capsule  Commonly known as:  AMOXIL  Take 2 capsules (1,000 mg total) by mouth 2 (two) times daily.  fluconazole 150 MG tablet  Commonly known as:  DIFLUCAN  Take 1 tablet (150 mg total) by mouth once.     LORazepam 0.5 MG tablet  Commonly known as:  ATIVAN  Take 1 tablet (0.5 mg total) by mouth every 8 (eight) hours as needed for anxiety. Take one by mouth as needed

## 2013-10-25 NOTE — Telephone Encounter (Signed)
Pt has appt today at 1145.

## 2013-11-08 ENCOUNTER — Telehealth: Payer: Self-pay | Admitting: Family Medicine

## 2013-11-08 NOTE — Telephone Encounter (Signed)
Pt has been seen twice in the office twice, once by Webb Silversmith on 1/12 and then on 1/14 by Dr. Lorelei Pont. Pt has a very dry cough, not coughing up anything. She is coughing so hard that she has thrown up a few times. Her voice is going away. Pt was offered cough medicine at her visit but at the time her husband was out of town and she did not feel comfortable taking the medication with small children. Her husband is back and would like to know if she can get something for the cough. Please advise. Pt would like c/b on cell [phone]

## 2013-11-09 MED ORDER — HYDROCODONE-HOMATROPINE 5-1.5 MG/5ML PO SYRP: 5.0000 mL | ORAL_SOLUTION | Freq: Three times a day (TID) | ORAL | Status: DC | PRN

## 2013-11-09 NOTE — Telephone Encounter (Signed)
Yes of course.  I would suggest Hycodan if she is comfortable with taking something with codeine. Rx printed.

## 2013-11-09 NOTE — Telephone Encounter (Signed)
Lm on pts vm informing her that Rx is available at the front desk for pickup;informed a gov't issued photo id required for pickup

## 2014-01-03 ENCOUNTER — Ambulatory Visit: Payer: Self-pay | Admitting: Obstetrics and Gynecology

## 2014-01-03 ENCOUNTER — Encounter: Payer: Self-pay | Admitting: Obstetrics and Gynecology

## 2014-02-02 ENCOUNTER — Other Ambulatory Visit: Payer: Self-pay | Admitting: Obstetrics and Gynecology

## 2014-02-02 ENCOUNTER — Encounter: Payer: Self-pay | Admitting: Obstetrics and Gynecology

## 2014-02-02 ENCOUNTER — Ambulatory Visit (INDEPENDENT_AMBULATORY_CARE_PROVIDER_SITE_OTHER): Payer: 59 | Admitting: Obstetrics and Gynecology

## 2014-02-02 VITALS — BP 126/80 | HR 76 | Ht 64.5 in | Wt 235.4 lb

## 2014-02-02 DIAGNOSIS — Z01419 Encounter for gynecological examination (general) (routine) without abnormal findings: Secondary | ICD-10-CM

## 2014-02-02 DIAGNOSIS — Z0000 Encounter for general adult medical examination without abnormal findings: Secondary | ICD-10-CM

## 2014-02-02 DIAGNOSIS — Z1231 Encounter for screening mammogram for malignant neoplasm of breast: Secondary | ICD-10-CM

## 2014-02-02 LAB — CBC
HCT: 40.9 % (ref 36.0–46.0)
Hemoglobin: 13.9 g/dL (ref 12.0–15.0)
MCH: 29.7 pg (ref 26.0–34.0)
MCHC: 34 g/dL (ref 30.0–36.0)
MCV: 87.4 fL (ref 78.0–100.0)
Platelets: 184 10*3/uL (ref 150–400)
RBC: 4.68 MIL/uL (ref 3.87–5.11)
RDW: 13.7 % (ref 11.5–15.5)
WBC: 6.8 10*3/uL (ref 4.0–10.5)

## 2014-02-02 LAB — HEMOGLOBIN, FINGERSTICK: Hemoglobin, fingerstick: 13.8 g/dL (ref 12.0–16.0)

## 2014-02-02 NOTE — Patient Instructions (Signed)

## 2014-02-02 NOTE — Progress Notes (Signed)
Patient ID: Virginia Oneal, female   DOB: September 28, 1966, 48 y.o.   MRN: [id number] GYNECOLOGY VISIT  PCP:   Alphonsa Overall, MD  Referring provider:   HPI: 48 y.o.   Married  Caucasian  female   G2P2002 with No LMP recorded. Patient has had a hysterectomy.   here for   AEX.  Gained weight.  Too much fast food per patient.  History of GDM.  Status post LAVH/LSO/midurethral sling 2010.  Hgb:   13.8 Urine:  Neg  GYNECOLOGIC HISTORY: No LMP recorded. Patient has had a hysterectomy. Sexually active: yes Partner preference: female Contraception:   Hysterectomy Menopausal hormone therapy:  no DES exposure:   no Blood transfusions:  no  Sexually transmitted diseases:  no  GYN procedures and prior surgeries:  TAH/LSO and colporrhaphy, C-section, endometrial ablation, tubal ligation.  Last mammogram:  02-15-13 screening mammogram revealed possible mass right breast.  Had diagnostic mammogram and ultrasound 03-01-13 at [facility] and revealed normal axillary tail lymph node.               Last pap and high risk HPV testing:   ?2012 History of abnormal pap smear:  Hx of abnormal pap in past 5 years, no colposcopy per patient but returned for repeat pap and it was normal.   OB History   Grav Para Term Preterm Abortions TAB SAB Ect Mult Living   2 2 2       2        LIFESTYLE: Exercise:   no           Tobacco:   no Alcohol:   no Drug use:  no  OTHER HEALTH MAINTENANCE: Tetanus/TDap:   09/2008 Gardisil:               n/a Influenza:            07/2013 Zostavax:             n/a  Bone density:       n/a Colonoscopy:       2013 with Dr. Carlean Purl revealing one polyp.  Due to family history of colon cancer, next colonoscopy due 2018.  Cholesterol check: wnl 2014  Family History  Problem Relation Age of Onset  . Kidney disease Mother     renal failure  . Stroke Mother   . Diabetes Mother   . Cancer Mother     colon  . Colon cancer Mother   . Cancer - Colon Mother     deceased age  69  . Hypertension Mother   . Stroke Father   . Cancer Maternal Aunt     colon  . Colon cancer Maternal Aunt   . Hypertension Sister   . Hypertension Brother   . Cancer - Colon Paternal Aunt     deceased  . Esophageal cancer Neg Hx   . Rectal cancer Neg Hx   . Stomach cancer Neg Hx     Patient Active Problem List   Diagnosis Date Noted  . Personal history of colonic polyps 01/18/2013  . Rotator cuff tendinitis 06/29/2011  . Anxiety 06/29/2011  . DIABETES MELLITUS, GESTATIONAL, HX OF 08/30/2008  . PREMATURE ATRIAL CONTRACTIONS 08/15/2008  . RENAL CALCULUS, HX OF 08/15/2008   Past Medical History  Diagnosis Date  . Kidney stones   . Depression   . Abnormal uterine bleeding   . Anxiety     postpartum and hx panic attacks  . Urinary incontinence 05/2009  hx bladder sling--Dr. Quincy Simmonds  . Hx of migraines   . Personal history of colonic polyps     Past Surgical History  Procedure Laterality Date  . Cesarean section  03/2003  . Vaginal hysterectomy  05/21/2009    lap assisted /partial  . Laparoscopic endometriosis fulguration      ablation  . Incontinence surgery      sling for urinary incontinence  . Colonoscopy  09/08  . Tympanoplasty      right ear  . Colporrhaphy  05/2009    Dr. Quincy Simmonds  . Abdominal hysterectomy  05/2009    TAH/LSO  . Cesarean section  2004  . Bladder suspension  2010    Dr. Quincy Simmonds  . Endometrial ablation  2006    Dr. Quincy Simmonds  . Oophorectomy  2010    w/hyst--Dr. Quincy Simmonds  . Tubal ligation  2004    w/ C-section Dr. Quincy Simmonds    ALLERGIES: Buspar  Current Outpatient Prescriptions  Medication Sig Dispense Refill  . LORazepam (ATIVAN) 0.5 MG tablet Take 1 tablet (0.5 mg total) by mouth every 8 (eight) hours as needed for anxiety. Take one by mouth as needed  60 tablet  0   No current facility-administered medications for this visit.     ROS:  Pertinent items are noted in HPI.  SOCIAL HISTORY:  Married.   PHYSICAL EXAMINATION:    BP 126/80   Pulse 76  Ht 5' 4.5" (1.638 m)  Wt 235 lb 6.4 oz (106.777 kg)  BMI 39.80 kg/m2   Wt Readings from Last 3 Encounters:  02/02/14 235 lb 6.4 oz (106.777 kg)  10/25/13 232 lb (105.235 kg)  10/23/13 232 lb 12 oz (105.575 kg)     Ht Readings from Last 3 Encounters:  02/02/14 5' 4.5" (1.638 m)  10/25/13 5\' 5"  (1.651 m)  01/18/13 5\' 5"  (1.651 m)    General appearance: alert, cooperative and appears stated age Head: Normocephalic, without obvious abnormality, atraumatic Neck: no adenopathy, supple, symmetrical, trachea midline and thyroid not enlarged, symmetric, no tenderness/mass/nodules Lungs: clear to auscultation bilaterally Breasts: Inspection negative, No nipple retraction or dimpling, No nipple discharge or bleeding, No axillary or supraclavicular adenopathy, Normal to palpation without dominant masses Heart: regular rate and rhythm Abdomen: obese, soft, non-tender; no masses,  no organomegaly Extremities: extremities normal, atraumatic, no cyanosis or edema Skin: Skin color, texture, turgor normal. No rashes or lesions Lymph nodes: Cervical, supraclavicular, and axillary nodes normal. No abnormal inguinal nodes palpated Neurologic: Grossly normal  Pelvic: External genitalia:  no lesions              Urethra:  normal appearing urethra with no masses, tenderness or lesions              Bartholins and Skenes: normal                 Vagina: normal appearing vagina with normal color and discharge, no lesions.  Good Kegel.               Cervix:  absent              Pap and high risk HPV testing done: no.            Bimanual Exam:  Uterus:   absent                                      Adnexa: no  masses                                      Rectovaginal: Confirms                                      Anus:  normal sphincter tone, no lesions  ASSESSMENT  Normal gynecologic exam. Weight gain.  History of GDM.  PLAN  Mammogram recommended yearly.  Patient will call [facility]. Pap smear and high risk HPV testing not indicated.  Lipid profile, CMP, CBC, TSH. I discussed weight loss through diet and exercise.  Stop fast food for self and the family's health.  Counseled on self breast exam. Return annually or prn   An After Visit Summary was printed and given to the patient.

## 2014-02-03 LAB — COMPREHENSIVE METABOLIC PANEL
ALT: 24 U/L (ref 0–35)
AST: 19 U/L (ref 0–37)
Albumin: 4.3 g/dL (ref 3.5–5.2)
Alkaline Phosphatase: 89 U/L (ref 39–117)
BUN: 15 mg/dL (ref 6–23)
CO2: 29 meq/L (ref 19–32)
CREATININE: 0.65 mg/dL (ref 0.50–1.10)
Calcium: 9.6 mg/dL (ref 8.4–10.5)
Chloride: 103 mEq/L (ref 96–112)
Glucose, Bld: 87 mg/dL (ref 70–99)
Potassium: 4.6 mEq/L (ref 3.5–5.3)
Sodium: 138 mEq/L (ref 135–145)
Total Bilirubin: 0.3 mg/dL (ref 0.2–1.2)
Total Protein: 6.8 g/dL (ref 6.0–8.3)

## 2014-02-03 LAB — HEMOGLOBIN A1C
HEMOGLOBIN A1C: 5.9 % — AB (ref <5.7)
MEAN PLASMA GLUCOSE: 123 mg/dL — AB (ref <117)

## 2014-02-03 LAB — LIPID PANEL
CHOLESTEROL: 183 mg/dL (ref 0–200)
HDL: 51 mg/dL (ref >39)
LDL Cholesterol: 97 mg/dL (ref 0–99)
TRIGLYCERIDES: 176 mg/dL — AB (ref <150)
Total CHOL/HDL Ratio: 3.6 Ratio
VLDL: 35 mg/dL (ref 0–40)

## 2014-02-03 LAB — TSH: TSH: 1.949 u[IU]/mL (ref 0.350–4.500)

## 2014-03-01 ENCOUNTER — Ambulatory Visit
Admission: RE | Admit: 2014-03-01 | Discharge: 2014-03-01 | Disposition: A | Discharge status: Home / Self Care | Payer: 59 | Source: Ambulatory Visit | Attending: Obstetrics and Gynecology | Admitting: Obstetrics and Gynecology

## 2014-03-01 DIAGNOSIS — Z1231 Encounter for screening mammogram for malignant neoplasm of breast: Secondary | ICD-10-CM

## 2014-07-23 ENCOUNTER — Ambulatory Visit (INDEPENDENT_AMBULATORY_CARE_PROVIDER_SITE_OTHER): Payer: 59

## 2014-07-23 DIAGNOSIS — Z23 Encounter for immunization: Secondary | ICD-10-CM

## 2014-07-26 ENCOUNTER — Encounter: Payer: Self-pay | Admitting: Family Medicine

## 2014-07-26 ENCOUNTER — Ambulatory Visit (INDEPENDENT_AMBULATORY_CARE_PROVIDER_SITE_OTHER): Payer: 59 | Admitting: Family Medicine

## 2014-07-26 VITALS — BP 128/74 | HR 76 | Temp 97.6°F | Wt 239.5 lb

## 2014-07-26 DIAGNOSIS — R079 Chest pain, unspecified: Secondary | ICD-10-CM

## 2014-07-26 DIAGNOSIS — R600 Localized edema: Secondary | ICD-10-CM | POA: Insufficient documentation

## 2014-07-26 DIAGNOSIS — R0600 Dyspnea, unspecified: Secondary | ICD-10-CM | POA: Insufficient documentation

## 2014-07-26 DIAGNOSIS — R0609 Other forms of dyspnea: Secondary | ICD-10-CM | POA: Insufficient documentation

## 2014-07-26 DIAGNOSIS — F411 Generalized anxiety disorder: Secondary | ICD-10-CM

## 2014-07-26 HISTORY — DX: Chest pain, unspecified: R07.9

## 2014-07-26 HISTORY — DX: Dyspnea, unspecified: R06.00

## 2014-07-26 HISTORY — DX: Other forms of dyspnea: R06.09

## 2014-07-26 LAB — COMPREHENSIVE METABOLIC PANEL
ALK PHOS: 75 U/L (ref 39–117)
ALT: 33 U/L (ref 0–35)
AST: 28 U/L (ref 0–37)
Albumin: 3.4 g/dL — ABNORMAL LOW (ref 3.5–5.2)
BILIRUBIN TOTAL: 0.4 mg/dL (ref 0.2–1.2)
BUN: 14 mg/dL (ref 6–23)
CO2: 27 mEq/L (ref 19–32)
Calcium: 9.7 mg/dL (ref 8.4–10.5)
Chloride: 104 mEq/L (ref 96–112)
Creatinine, Ser: 0.7 mg/dL (ref 0.4–1.2)
GFR: 92.53 mL/min (ref >60.00)
Glucose, Bld: 83 mg/dL (ref 70–99)
Potassium: 3.9 mEq/L (ref 3.5–5.1)
SODIUM: 139 meq/L (ref 135–145)
Total Protein: 7.4 g/dL (ref 6.0–8.3)

## 2014-07-26 LAB — CBC
HEMATOCRIT: 36.4 % (ref 36.0–46.0)
HEMOGLOBIN: 12.1 g/dL (ref 12.0–15.0)
MCHC: 33.2 g/dL (ref 30.0–36.0)
MCV: 90.2 fl (ref 78.0–100.0)
PLATELETS: 161 10*3/uL (ref 150.0–400.0)
RBC: 4.04 Mil/uL (ref 3.87–5.11)
RDW: 13.3 % (ref 11.5–15.5)
WBC: 8.8 10*3/uL (ref 4.0–10.5)

## 2014-07-26 LAB — BRAIN NATRIURETIC PEPTIDE: Pro B Natriuretic peptide (BNP): 21 pg/mL (ref 0.0–100.0)

## 2014-07-26 LAB — TSH: TSH: 1.61 u[IU]/mL (ref 0.35–4.50)

## 2014-07-26 MED ORDER — LORAZEPAM 0.5 MG PO TABS: 0.5000 mg | ORAL_TABLET | Freq: Three times a day (TID) | ORAL | Status: DC | PRN

## 2014-07-26 NOTE — Progress Notes (Signed)
Subjective:    Patient ID: Virginia Oneal, female    DOB: 1966-09-21, 48 y.o.   MRN: [id number]  HPI  48 yo pleasant female here for 2 weeks of pedal edema.  Has not been improving after elevation.  When she has had swelling in past, usually has resolved by the time she wakes up. Has had no changes in her diet or activity. Has gained some weight. Wt Readings from Last 3 Encounters:  07/26/14 239 lb 8 oz (108.636 kg)  02/02/14 235 lb 6.4 oz (106.777 kg)  10/25/13 232 lb (105.235 kg)   Noticed that she has had some DOE- more than usual.  Had some fleeting chest pain with exertion that was relieved by rest as well.  Former smoker.  Lab Results  Component Value Date   CHOL 183 02/02/2014   HDL 51 02/02/2014   LDLCALC 97 02/02/2014   TRIG 176* 02/02/2014   CHOLHDL 3.6 02/02/2014   Takes no medications regularly.  Current Outpatient Prescriptions on File Prior to Visit  Medication Sig Dispense Refill  . LORazepam (ATIVAN) 0.5 MG tablet Take 1 tablet (0.5 mg total) by mouth every 8 (eight) hours as needed for anxiety. Take one by mouth as needed  60 tablet  0   No current facility-administered medications on file prior to visit.    Allergies  Allergen Reactions  . Buspar [Buspirone Hcl]     Worsened her panic attacks    Past Medical History  Diagnosis Date  . Kidney stones   . Depression   . Abnormal uterine bleeding   . Anxiety     postpartum and hx panic attacks  . Urinary incontinence 05/2009    hx bladder sling--Dr. Quincy Simmonds  . Hx of migraines   . Personal history of colonic polyps     Past Surgical History  Procedure Laterality Date  . Cesarean section  03/2003  . Vaginal hysterectomy  05/21/2009    lap assisted /partial  . Laparoscopic endometriosis fulguration      ablation  . Incontinence surgery      sling for urinary incontinence  . Colonoscopy  09/08  . Tympanoplasty      right ear  . Colporrhaphy  05/2009    Dr. Quincy Simmonds  . Abdominal hysterectomy  05/2009     TAH/LSO  . Cesarean section  2004  . Bladder suspension  2010    Dr. Quincy Simmonds  . Endometrial ablation  2006    Dr. Quincy Simmonds  . Oophorectomy  2010    w/hyst--Dr. Quincy Simmonds  . Tubal ligation  2004    w/ C-section Dr. Quincy Simmonds    Family History  Problem Relation Age of Onset  . Kidney disease Mother     renal failure  . Stroke Mother   . Diabetes Mother   . Cancer Mother     colon  . Colon cancer Mother   . Cancer - Colon Mother     deceased age 50  . Hypertension Mother   . Stroke Father   . Cancer Maternal Aunt     colon  . Colon cancer Maternal Aunt   . Hypertension Sister   . Hypertension Brother   . Cancer - Colon Paternal Aunt     deceased  . Esophageal cancer Neg Hx   . Rectal cancer Neg Hx   . Stomach cancer Neg Hx     History   Social History  . Marital Status: Married    Spouse Name: N/A  Number of Children: 2  . Years of Education: N/A   Occupational History  . [employer]    Social History Main Topics  . Smoking status: Former Smoker    Types: Cigarettes    Quit date: 01/06/1999  . Smokeless tobacco: Never Used  . Alcohol Use: No  . Drug Use: No  . Sexual Activity: Yes    Partners: Male    Birth Control/ Protection: Other-see comments, Surgical     Comment: TAH/LSO   Other Topics Concern  . Not on file   Social History Narrative   ** Merged History Encounter **       The PMH, PSH, Social History, Family History, Medications, and allergies have been reviewed in [facility], and have been updated if relevant.     Review of Systems  Constitutional: Negative for chills and diaphoresis.  Respiratory: Positive for chest tightness and shortness of breath. Negative for cough.   Cardiovascular: Positive for chest pain and leg swelling. Negative for palpitations.  All other systems reviewed and are negative.      Objective:   Physical Exam  Constitutional: She is oriented to person, place, and time. She appears well-developed and well-nourished.  No distress.  HENT:  Head: Normocephalic and atraumatic.  Cardiovascular: Normal rate and regular rhythm.   No murmur heard. Pulmonary/Chest: Effort normal and breath sounds normal. No respiratory distress.  Musculoskeletal: She exhibits edema.  Neurological: She is alert and oriented to person, place, and time.  Skin: Skin is warm and dry.  Psychiatric: She has a normal mood and affect. Her behavior is normal. Judgment and thought content normal.   BP 128/74  Pulse 76  Temp(Src) 97.6 F (36.4 C) (Oral)  Wt 239 lb 8 oz (108.636 kg)  SpO2 97%        Assessment & Plan:

## 2014-07-26 NOTE — Progress Notes (Signed)
Pre visit review using our clinic review tool, if applicable. No additional management support is needed unless otherwise documented below in the visit note. 

## 2014-07-26 NOTE — Patient Instructions (Signed)
I will call you with your lab results. Please stop by to see Rosaria Ferries on your way out. It was good to see you.

## 2014-07-26 NOTE — Assessment & Plan Note (Signed)
New- Likely due to increased weight gain and deconditioning but need to rule out other causes, including cardiac. EKG reassuring today- NSR. Check BNP. Refer to cardiology for stress test. The patient indicates understanding of these issues and agrees with the plan.

## 2014-07-26 NOTE — Assessment & Plan Note (Signed)
New- 1+ pitting edema on exam. Exam otherwise reassuring. Will start work up with labs today. Orders Placed This Encounter  Procedures  . Brain natriuretic peptide  . Comprehensive metabolic panel  . TSH  . CBC  . Ambulatory referral to Cardiology  . EKG 12-Lead

## 2014-07-27 ENCOUNTER — Encounter: Payer: Self-pay | Admitting: Family Medicine

## 2014-08-08 ENCOUNTER — Telehealth: Payer: Self-pay | Admitting: Obstetrics and Gynecology

## 2014-08-08 NOTE — Telephone Encounter (Signed)
Left message regarding cancelled appt. °

## 2014-08-09 ENCOUNTER — Ambulatory Visit (INDEPENDENT_AMBULATORY_CARE_PROVIDER_SITE_OTHER): Payer: 59 | Admitting: Cardiovascular Disease

## 2014-08-09 ENCOUNTER — Encounter: Payer: Self-pay | Admitting: Cardiovascular Disease

## 2014-08-09 VITALS — BP 147/99 | HR 73 | Ht 65.0 in | Wt 240.5 lb

## 2014-08-09 DIAGNOSIS — R600 Localized edema: Secondary | ICD-10-CM

## 2014-08-09 DIAGNOSIS — R079 Chest pain, unspecified: Secondary | ICD-10-CM

## 2014-08-09 NOTE — Progress Notes (Signed)
Primary care physician:Dr. Deborra Medina  HPI  This is a 48 year old female who was referred for evaluation of exertional dyspnea and lower extremity edema. She has no previous cardiac history. She has no significant chronic medical conditions other than obesity. She quit smoking many years ago. She does have family history of coronary artery disease but not prematurely. She reports gradual exertional dyspnea currently happening with minimal activities. She has occasional chest pain which she describes as muscle aching. This usually happens at rest and not with physical activities. She also reports lower extremity edema which is worse at the end of the day and usually better in the morning. She has no history of DVT. She does not exercise on a regular basis.   Allergies  Allergen Reactions  . Buspar [Buspirone Hcl]     Worsened her panic attacks     Current Outpatient Prescriptions on File Prior to Visit  Medication Sig Dispense Refill  . LORazepam (ATIVAN) 0.5 MG tablet Take 1 tablet (0.5 mg total) by mouth every 8 (eight) hours as needed for anxiety. Take one by mouth as needed  60 tablet  0   No current facility-administered medications on file prior to visit.     Past Medical History  Diagnosis Date  . Kidney stones   . Depression   . Abnormal uterine bleeding   . Anxiety     postpartum and hx panic attacks  . Urinary incontinence 05/2009    hx bladder sling--Dr. Quincy Simmonds  . Hx of migraines   . Personal history of colonic polyps   . Arrhythmia      Past Surgical History  Procedure Laterality Date  . Cesarean section  03/2003  . Vaginal hysterectomy  05/21/2009    lap assisted /partial  . Laparoscopic endometriosis fulguration      ablation  . Incontinence surgery      sling for urinary incontinence  . Colonoscopy  09/08  . Tympanoplasty      right ear  . Colporrhaphy  05/2009    Dr. Quincy Simmonds  . Abdominal hysterectomy  05/2009    TAH/LSO  . Cesarean section  2004  . Bladder  suspension  2010    Dr. Quincy Simmonds  . Endometrial ablation  2006    Dr. Quincy Simmonds  . Oophorectomy  2010    w/hyst--Dr. Quincy Simmonds  . Tubal ligation  2004    w/ C-section Dr. Quincy Simmonds     Family History  Problem Relation Age of Onset  . Kidney disease Mother     renal failure  . Stroke Mother   . Diabetes Mother   . Cancer Mother     colon  . Colon cancer Mother   . Cancer - Colon Mother     deceased age 33  . Hypertension Mother   . Heart failure Mother   . Stroke Father   . Cancer Maternal Aunt     colon  . Colon cancer Maternal Aunt   . Hypertension Sister   . Hypertension Brother   . Cancer - Colon Paternal Aunt     deceased  . Esophageal cancer Neg Hx   . Rectal cancer Neg Hx   . Stomach cancer Neg Hx      History   Social History  . Marital Status: Married    Spouse Name: N/A    Number of Children: 2  . Years of Education: N/A   Occupational History  . [employer]    Social History Main Topics  . Smoking  status: Former Smoker    Types: Cigarettes    Quit date: 01/06/1999  . Smokeless tobacco: Never Used  . Alcohol Use: No  . Drug Use: No  . Sexual Activity: Yes    Partners: Male    Birth Control/ Protection: Other-see comments, Surgical     Comment: TAH/LSO   Other Topics Concern  . Not on file   Social History Narrative   ** Merged History Encounter **         ROS A 10 point review of system was performed. It is negative other than that mentioned in the history of present illness.   PHYSICAL EXAM   BP 147/99  Pulse 73  Ht 5\' 5"  (1.651 m)  Wt 240 lb 8 oz (109.09 kg)  BMI 40.02 kg/m2 Constitutional: She is oriented to person, place, and time. She appears well-developed and well-nourished. No distress.  HENT: No nasal discharge.  Head: Normocephalic and atraumatic.  Eyes: Pupils are equal and round. No discharge.  Neck: Normal range of motion. Neck supple. No JVD present. No thyromegaly present.  Cardiovascular: Normal rate, regular  rhythm, normal heart sounds. Exam reveals no gallop and no friction rub. No murmur heard.  Pulmonary/Chest: Effort normal and breath sounds normal. No stridor. No respiratory distress. She has no wheezes. She has no rales. She exhibits no tenderness.  Abdominal: Soft. Bowel sounds are normal. She exhibits no distension. There is no tenderness. There is no rebound and no guarding.  Musculoskeletal: Normal range of motion. She exhibits no edema and no tenderness.  Neurological: She is alert and oriented to person, place, and time. Coordination normal.  Skin: Skin is warm and dry. No rash noted. She is not diaphoretic. No erythema. No pallor.  Psychiatric: She has a normal mood and affect. Her behavior is normal. Judgment and thought content normal.     XTG:GYIRS  Rhythm  WITHIN NORMAL LIMITS   ASSESSMENT AND PLAN

## 2014-08-09 NOTE — Assessment & Plan Note (Signed)
Most likely this is due to chronic venous insufficiency. I advised her to perform leg elevation during the day and consider using low pressure knee-high support stockings during the day.

## 2014-08-09 NOTE — Patient Instructions (Signed)
Your physician has requested that you have a stress echocardiogram. For further information please visit [email or web address]. Please follow instruction sheet as given.  Follow up as needed.   Our new office is  located at :  [facility]  [address] [place]  [place], [place] [address]

## 2014-08-09 NOTE — Assessment & Plan Note (Signed)
The chest pain is overall atypical and seems to be musculoskeletal. She does have exertional dyspnea which is likely multifactorial with element of physical deconditioning. I recommend evaluation with a stress echocardiogram.

## 2014-08-13 ENCOUNTER — Encounter: Payer: Self-pay | Admitting: Cardiovascular Disease

## 2014-08-31 ENCOUNTER — Telehealth: Payer: Self-pay | Admitting: *Deleted

## 2014-08-31 ENCOUNTER — Other Ambulatory Visit (INDEPENDENT_AMBULATORY_CARE_PROVIDER_SITE_OTHER): Payer: 59

## 2014-08-31 DIAGNOSIS — R0602 Shortness of breath: Secondary | ICD-10-CM

## 2014-08-31 DIAGNOSIS — R079 Chest pain, unspecified: Secondary | ICD-10-CM

## 2014-08-31 NOTE — Telephone Encounter (Signed)
Reviewed stress echo instructions with patient  Patient verbalized understanding  

## 2015-02-06 ENCOUNTER — Encounter: Payer: Self-pay | Admitting: Obstetrics and Gynecology

## 2015-02-06 ENCOUNTER — Ambulatory Visit (INDEPENDENT_AMBULATORY_CARE_PROVIDER_SITE_OTHER): Payer: 59 | Admitting: Obstetrics and Gynecology

## 2015-02-06 ENCOUNTER — Ambulatory Visit: Payer: 59 | Admitting: Obstetrics and Gynecology

## 2015-02-06 VITALS — BP 122/84 | HR 60 | Resp 18 | Ht 64.5 in | Wt 240.6 lb

## 2015-02-06 DIAGNOSIS — Z0000 Encounter for general adult medical examination without abnormal findings: Secondary | ICD-10-CM

## 2015-02-06 DIAGNOSIS — Z01419 Encounter for gynecological examination (general) (routine) without abnormal findings: Secondary | ICD-10-CM

## 2015-02-06 LAB — POCT URINALYSIS DIPSTICK
BILIRUBIN UA: NEGATIVE
Blood, UA: NEGATIVE
GLUCOSE UA: NEGATIVE
KETONES UA: NEGATIVE
LEUKOCYTES UA: NEGATIVE
Nitrite, UA: NEGATIVE
Protein, UA: NEGATIVE
Urobilinogen, UA: NEGATIVE
pH, UA: 5

## 2015-02-06 NOTE — Progress Notes (Signed)
Patient ID: Virginia Oneal, female   DOB: [birth year], 49 y.o.   MRN: [id number] 49 y.o. [id number] Married Caucasian female here for annual exam.   Having hot flashes and night sweats.  Tolerating them well.   Having lower extremity swelling.  Saw cardiology and had a normal stress test and ECHO.   PCP:   Alphonsa Overall, MD  No LMP recorded. Patient has had a hysterectomy.          Sexually active: Yes.  female partner  The current method of family planning is status post hysterectomy.(LAVH/LSO)    Exercising: Yes.    Some walking. Smoker:  no  Health Maintenance: Pap:  ?2012 normal History of abnormal Pap:  Yes, 2010 no colposcopy per patient but returned for repeat pap and it was normal. MMG:  03-01-14 fibroglandular density/nl:[facility]. Colonoscopy:  01-28-13 hyperplastic polyp with Dr. Aurther Loft of colon polyps).  Next due 01/2018. BMD:   n/a  Result  n/a TDaP:  09/2008 Screening Labs:  Hb today: PCP, Urine today: Neg   reports that she quit smoking about 16 years ago. Her smoking use included Cigarettes. She has never used smokeless tobacco. She reports that she does not drink alcohol or use illicit drugs.  Past Medical History  Diagnosis Date  . Kidney stones   . Depression   . Abnormal uterine bleeding   . Anxiety     postpartum and hx panic attacks  . Urinary incontinence 05/2009    hx bladder sling--Dr. Quincy Simmonds  . Hx of migraines   . Personal history of colonic polyps   . Arrhythmia     Past Surgical History  Procedure Laterality Date  . Cesarean section  03/2003  . Vaginal hysterectomy  05/21/2009    lap assisted /partial  . Laparoscopic endometriosis fulguration      ablation  . Incontinence surgery      sling for urinary incontinence  . Colonoscopy  09/08  . Tympanoplasty      right ear  . Colporrhaphy  05/2009    Dr. Quincy Simmonds  . Abdominal hysterectomy  05/2009    TAH/LSO  . Cesarean section  2004  . Bladder suspension  2010    Dr. Quincy Simmonds  . Endometrial  ablation  2006    Dr. Quincy Simmonds  . Oophorectomy  2010    w/hyst--Dr. Quincy Simmonds  . Tubal ligation  2004    w/ C-section Dr. Quincy Simmonds    Current Outpatient Prescriptions  Medication Sig Dispense Refill  . LORazepam (ATIVAN) 0.5 MG tablet Take 1 tablet (0.5 mg total) by mouth every 8 (eight) hours as needed for anxiety. Take one by mouth as needed 60 tablet 0   No current facility-administered medications for this visit.    Family History  Problem Relation Age of Onset  . Kidney disease Mother     renal failure  . Stroke Mother   . Diabetes Mother   . Cancer Mother     colon  . Colon cancer Mother   . Cancer - Colon Mother     deceased age 37  . Hypertension Mother   . Heart failure Mother   . Stroke Father   . Cancer Maternal Aunt     colon  . Colon cancer Maternal Aunt   . Hypertension Sister   . Rheum arthritis Sister   . Hypertension Brother   . Cancer - Colon Paternal Aunt     deceased  . Esophageal cancer Neg Hx   . Rectal  cancer Neg Hx   . Stomach cancer Neg Hx   . Hypertension Sister   . Hypertension Sister   . Hypertension Sister     ROS:  Pertinent items are noted in HPI.  Otherwise, a comprehensive ROS was negative.  Exam:   BP 122/84 mmHg  Pulse 60  Resp 18  Ht 5' 4.5" (1.638 m)  Wt 240 lb 9.6 oz (109.135 kg)  BMI 40.68 kg/m2    General appearance: alert, cooperative and appears stated age Head: Normocephalic, without obvious abnormality, atraumatic Neck: no adenopathy, supple, symmetrical, trachea midline and thyroid normal to inspection and palpation Lungs: clear to auscultation bilaterally Breasts: normal appearance, no masses or tenderness, Inspection negative, No nipple retraction or dimpling, No nipple discharge or bleeding, No axillary or supraclavicular adenopathy Heart: regular rate and rhythm Abdomen: soft, non-tender; bowel sounds normal; no masses,  no organomegaly Extremities: extremities normal, atraumatic, no cyanosis or edema Skin: Skin  color, texture, turgor normal. No rashes or lesions Lymph nodes: Cervical, supraclavicular, and axillary nodes normal. No abnormal inguinal nodes palpated Neurologic: Grossly normal  Pelvic: External genitalia:  no lesions              Urethra:  normal appearing urethra with no masses, tenderness or lesions              Bartholins and Skenes: normal                 Vagina: normal appearing vagina with normal color and discharge, no lesions              Cervix: absent              Pap taken: No. Bimanual Exam:  Uterus:  uterus absent              Adnexa: normal adnexa and no mass, fullness, tenderness              Rectovaginal: Yes.  .  Confirms.              Anus:  normal sphincter tone, no lesions  Chaperone was present for exam.  Assessment:   Well woman visit with normal exam. Status post LAVH/LSO/midurethral sling. Menopausal symptoms.  LE edema.  Overweight.   Plan: Yearly mammogram recommended after age 35.  Recommended self breast exam.  Pap and HR HPV as above. Discussed Calcium, Vitamin D, regular exercise program including cardiovascular and weight bearing exercise. Labs performed.  No..     Refills given on medications.  No..    Discussed options tx menopausal symptoms - estrogens, SSRIs, Effexor, herbal options.   Desires herbal remedy.  Info to patient.  Discussed HCTZ for LE edema.  She will contact her PCP regarding this.  I encouraged healthy diet and exercise for weight loss.  Follow up annually and prn.      After visit summary provided.

## 2015-02-06 NOTE — Patient Instructions (Addendum)
Menopause and Herbal Products Menopause is the normal time of life when menstrual periods stop completely. Menopause is complete when you have missed 12 consecutive menstrual periods. It usually occurs between the ages of 48 to 55, with an average age of 51. Very rarely does a woman develop menopause before 51 years old. At menopause, your ovaries stop producing the female hormones, estrogen and progesterone. This can cause undesirable symptoms and also affect your health. Sometimes the symptoms can occur 4 to 5 years before the menopause begins. There is no relationship between menopause and:  Oral contraceptives.  Number of children you had.  Race.  The age your menstrual periods started (menarche). Heavy smokers and very thin women may develop menopause earlier in life. Estrogen and progesterone hormone treatment is the usual method of treating menopausal symptoms. However, there are women who should not take hormone treatment. This is true of:   Women that have breast or uterine cancer.  Women who prefer not to take hormones because of certain side effects (abnormal uterine bleeding).  Women who are afraid that hormones may cause breast cancer.  Women who have a history of liver disease, heart disease, stroke, or blood clots. For these women, there are other medications that may help treat their menopausal symptoms. These medications are found in plants and botanical products. They can be found in the form of herbs, teas, oils, tinctures, and pills.  CAUSES:  The ovaries stop producing the female hormones estrogen and progesterone.  Other causes include:  Surgery to remove both ovaries.  The ovaries stop functioning for no know reason.  Tumors of the pituitary gland in the brain.  Medical disease that affects the ovaries and hormone production.  Radiation treatment to the abdomen or pelvis.  Chemotherapy that affects the ovaries. PHYTOESTROGENS: Phytoestrogens occur  naturally in plants and plant products. They act like estrogen in the body. Herbal medications are made from these plants and botanical steroids. There are 3 types of phytoestrogens:  Isoflavones (genistein and daidzein) are found in soy, garbanzo beans, miso and tofu foods.  Ligins are found in the shell of seeds. They are used to make oils like flaxseed oil. The bacteria in your intestine act on these foods to produce the estrogen-like hormones.  Coumestans are estrogen-like. Some of the foods they are found in include sunflower seeds and bean sprouts. CONDITIONS AND THEIR POSSIBLE HERBAL TREATMENT:  Hot flashes and night sweats.  Soy, black cohosh and evening primrose.  Irritability, insomnia, depression and memory problems.  Chasteberry, ginseng, and soy.  St. John's wort may be helpful for depression. However, there is a concern of it causing cataracts of the eye and may have bad effects on other medications. St. John's wort should not be taken for long time and without your caregiver's advice.  Loss of libido and vaginal and skin dryness.  Wild yam and soy.  Prevention of coronary heart disease and osteoporosis.  Soy and Isoflavones. Several studies have shown that some women benefit from herbal medications, but most of the studies have not consistently shown that these supplements are much better than placebo. Other forms of treatment to help women with menopausal symptoms include a balanced diet, rest, exercise, vitamin and calcium (with vitamin D) supplements, acupuncture, and group therapy when necessary. THOSE WHO SHOULD NOT TAKE HERBAL MEDICATIONS INCLUDE:  Women who are planning on getting pregnant unless told by your caregiver.  Women who are breastfeeding unless told by your caregiver.  Women who are taking other   prescription medications unless told by your caregiver.  Infants, children, and elderly women unless told by your caregiver. Different herbal medications  have different and unmeasured amounts of the herbal ingredients. There are no regulations, quality control, and standardization of the ingredients in herbal medications. Therefore, the amount of the ingredient in the medication may vary from one herb, pill, tea, oil or tincture to another. Many herbal medications can cause serious problems and can even have poisonous effects if taken too much or too long. If problems develop, the medication should be stopped and recorded by your caregiver. HOME CARE INSTRUCTIONS  Do not take or give children herbal medications without your caregiver's advice.  Let your caregiver know all the medications you are taking. This includes prescription, over-the-counter, eye drops, and creams.  Do not take herbal medications longer or more than recommended.  Tell your caregiver about any side effects from the medication. SEEK MEDICAL CARE IF:  You develop a fever of 102 F (38.9 C), or as directed by your caregiver.  You feel sick to your stomach (nauseous), vomit, or have diarrhea.  You develop a rash.  You develop abdominal pain.  You develop severe headaches.  You start to have vision problems.  You feel dizzy or faint.  You start to feel numbness in any part of your body.  You start shaking (have convulsions). Document Released: 03/16/2008 Document Revised: 09/14/2012 Document Reviewed: 10/14/2010 [facility] Patient Information 2015 [place], [place]. This information is not intended to replace advice given to you by your health care provider. Make sure you discuss any questions you have with your health care provider.  EXERCISE AND DIET:  We recommended that you start or continue a regular exercise program for good health. Regular exercise means any activity that makes your heart beat faster and makes you sweat.  We recommend exercising at least 30 minutes per day at least 3 days a week, preferably 4 or 5.  We also recommend a diet low in fat and sugar.   Inactivity, poor dietary choices and obesity can cause diabetes, heart attack, stroke, and kidney damage, among others.    ALCOHOL AND SMOKING:  Women should limit their alcohol intake to no more than 7 drinks/beers/glasses of wine (combined, not each!) per week. Moderation of alcohol intake to this level decreases your risk of breast cancer and liver damage. And of course, no recreational drugs are part of a healthy lifestyle.  And absolutely no smoking or even second hand smoke. Most people know smoking can cause heart and lung diseases, but did you know it also contributes to weakening of your bones? Aging of your skin?  Yellowing of your teeth and nails?  CALCIUM AND VITAMIN D:  Adequate intake of calcium and Vitamin D are recommended.  The recommendations for exact amounts of these supplements seem to change often, but generally speaking 600 mg of calcium (either carbonate or citrate) and 800 units of Vitamin D per day seems prudent. Certain women may benefit from higher intake of Vitamin D.  If you are among these women, your doctor will have told you during your visit.    PAP SMEARS:  Pap smears, to check for cervical cancer or precancers,  have traditionally been done yearly, although recent scientific advances have shown that most women can have pap smears less often.  However, every woman still should have a physical exam from her gynecologist every year. It will include a breast check, inspection of the vulva and vagina to check for  abnormal growths or skin changes, a visual exam of the cervix, and then an exam to evaluate the size and shape of the uterus and ovaries.  And after 51 years of age, a rectal exam is indicated to check for rectal cancers. We will also provide age appropriate advice regarding health maintenance, like when you should have certain vaccines, screening for sexually transmitted diseases, bone density testing, colonoscopy, mammograms, etc.   MAMMOGRAMS:  All women over 36  years old should have a yearly mammogram. Many facilities now offer a "3D" mammogram, which may cost around $50 extra out of pocket. If possible,  we recommend you accept the option to have the 3D mammogram performed.  It both reduces the number of women who will be called back for extra views which then turn out to be normal, and it is better than the routine mammogram at detecting truly abnormal areas.    COLONOSCOPY:  Colonoscopy to screen for colon cancer is recommended for all women at age 38.  We know, you hate the idea of the prep.  We agree, BUT, having colon cancer and not knowing it is worse!!  Colon cancer so often starts as a polyp that can be seen and removed at colonscopy, which can quite literally save your life!  And if your first colonoscopy is normal and you have no family history of colon cancer, most women don't have to have it again for 10 years.  Once every ten years, you can do something that may end up saving your life, right?  We will be happy to help you get it scheduled when you are ready.  Be sure to check your insurance coverage so you understand how much it will cost.  It may be covered as a preventative service at no cost, but you should check your particular policy.

## 2015-02-26 ENCOUNTER — Other Ambulatory Visit: Payer: Self-pay

## 2015-02-26 DIAGNOSIS — Z1231 Encounter for screening mammogram for malignant neoplasm of breast: Secondary | ICD-10-CM

## 2015-03-21 ENCOUNTER — Ambulatory Visit
Admission: RE | Admit: 2015-03-21 | Discharge: 2015-03-21 | Disposition: A | Discharge status: Home / Self Care | Payer: 59 | Source: Ambulatory Visit

## 2015-03-21 DIAGNOSIS — Z1231 Encounter for screening mammogram for malignant neoplasm of breast: Secondary | ICD-10-CM

## 2015-08-13 ENCOUNTER — Ambulatory Visit (INDEPENDENT_AMBULATORY_CARE_PROVIDER_SITE_OTHER): Payer: 59 | Admitting: Family Medicine

## 2015-08-13 ENCOUNTER — Encounter: Payer: Self-pay | Admitting: Family Medicine

## 2015-08-13 VITALS — BP 128/70 | HR 80 | Temp 98.1°F | Wt 248.5 lb

## 2015-08-13 DIAGNOSIS — F411 Generalized anxiety disorder: Secondary | ICD-10-CM

## 2015-08-13 DIAGNOSIS — D179 Benign lipomatous neoplasm, unspecified: Secondary | ICD-10-CM | POA: Insufficient documentation

## 2015-08-13 DIAGNOSIS — J069 Acute upper respiratory infection, unspecified: Secondary | ICD-10-CM | POA: Diagnosis not present

## 2015-08-13 DIAGNOSIS — Z23 Encounter for immunization: Secondary | ICD-10-CM

## 2015-08-13 MED ORDER — LORAZEPAM 0.5 MG PO TABS: 0.5000 mg | ORAL_TABLET | Freq: Three times a day (TID) | ORAL | Status: DC | PRN

## 2015-08-13 NOTE — Patient Instructions (Signed)
Good to see you. Please stop by to see Virginia Oneal on your way out. 

## 2015-08-13 NOTE — Progress Notes (Signed)
Pre visit review using our clinic review tool, if applicable. No additional management support is needed unless otherwise documented below in the visit note. 

## 2015-08-13 NOTE — Assessment & Plan Note (Signed)
New-  Symptomatic therapy suggested: push fluids, rest and return office visit prn if symptoms persist or worsen. Lack of antibiotic effectiveness discussed with her. Call or return to clinic prn if these symptoms worsen or fail to improve as anticipated.

## 2015-08-13 NOTE — Assessment & Plan Note (Signed)
Growing and becoming symptomatic. Refer to gen surgery for removal. The patient indicates understanding of these issues and agrees with the plan.

## 2015-08-13 NOTE — Progress Notes (Signed)
Subjective:   Patient ID: Virginia Oneal, female    DOB: 1966-06-10, 49 y.o.   MRN: [id number]  Virginia Oneal is a pleasant 49 y.o. year old female who presents to clinic today with Mass and URI  on 08/13/2015  HPI:  Mass left side of neck- Noticed it a few months ago, growing in size.  She now feels it is causing her shoulder to hurt, especially at night.  URI- complains of 1 week of cough, runny nose, sore throat. Mild SOB, no CP.  No fevers but she has had chills and myalgias.  Took some OTC decongestants.  She does feel like symptoms improving.  No current outpatient prescriptions on file prior to visit.   No current facility-administered medications on file prior to visit.    Allergies  Allergen Reactions  . Buspar [Buspirone Hcl]     Worsened her panic attacks    Past Medical History  Diagnosis Date  . Kidney stones   . Depression   . Abnormal uterine bleeding   . Anxiety     postpartum and hx panic attacks  . Urinary incontinence 05/2009    hx bladder sling--Dr. Quincy Simmonds  . Hx of migraines   . Personal history of colonic polyps   . Arrhythmia     Past Surgical History  Procedure Laterality Date  . Cesarean section  03/2003  . Vaginal hysterectomy  05/21/2009    lap assisted /partial  . Laparoscopic endometriosis fulguration      ablation  . Incontinence surgery      sling for urinary incontinence  . Colonoscopy  09/08  . Tympanoplasty      right ear  . Colporrhaphy  05/2009    Dr. Quincy Simmonds  . Abdominal hysterectomy  05/2009    TAH/LSO  . Cesarean section  2004  . Bladder suspension  2010    Dr. Quincy Simmonds  . Endometrial ablation  2006    Dr. Quincy Simmonds  . Oophorectomy  2010    w/hyst--Dr. Quincy Simmonds  . Tubal ligation  2004    w/ C-section Dr. Quincy Simmonds    Family History  Problem Relation Age of Onset  . Kidney disease Mother     renal failure  . Stroke Mother   . Diabetes Mother   . Cancer Mother     colon  . Colon cancer Mother   . Cancer - Colon Mother    deceased age 17  . Hypertension Mother   . Heart failure Mother   . Stroke Father   . Cancer Maternal Aunt     colon  . Colon cancer Maternal Aunt   . Hypertension Sister   . Rheum arthritis Sister   . Hypertension Brother   . Cancer - Colon Paternal Aunt     deceased  . Esophageal cancer Neg Hx   . Rectal cancer Neg Hx   . Stomach cancer Neg Hx   . Hypertension Sister   . Hypertension Sister   . Hypertension Sister     Social History   Social History  . Marital Status: Married    Spouse Name: N/A  . Number of Children: 2  . Years of Education: N/A   Occupational History  . [employer]    Social History Main Topics  . Smoking status: Former Smoker    Types: Cigarettes    Quit date: 01/06/1999  . Smokeless tobacco: Never Used  . Alcohol Use: No  . Drug Use: No  . Sexual Activity:  Partners: Male    Birth Control/ Protection: Other-see comments, Surgical     Comment: TAH/LSO   Other Topics Concern  . Not on file   Social History Narrative   ** Merged History Encounter **       The PMH, PSH, Social History, Family History, Medications, and allergies have been reviewed in [facility], and have been updated if relevant.  Review of Systems  Constitutional: Positive for chills. Negative for fever and unexpected weight change.  HENT: Positive for ear pain, postnasal drip, rhinorrhea and sore throat. Negative for ear discharge, facial swelling, hearing loss, mouth sores, nosebleeds, sinus pressure, sneezing, trouble swallowing and voice change.   Respiratory: Positive for cough and shortness of breath. Negative for wheezing and stridor.   Cardiovascular: Negative.   Gastrointestinal: Negative.   Endocrine: Negative.   Musculoskeletal: Positive for arthralgias.  Skin: Negative.   Allergic/Immunologic: Negative.   Neurological: Negative.   Hematological: Negative.   Psychiatric/Behavioral: Negative.   All other systems reviewed and are negative.        Objective:    BP 128/70 mmHg  Pulse 80  Temp(Src) 98.1 F (36.7 C) (Oral)  Wt 248 lb 8 oz (112.719 kg)  SpO2 97%   Physical Exam  Constitutional: She is oriented to person, place, and time. She appears well-developed and well-nourished. No distress.  HENT:  Head: Normocephalic.  Left Ear:  No middle ear effusion.  Nose: Mucosal edema and rhinorrhea present. Right sinus exhibits no maxillary sinus tenderness and no frontal sinus tenderness. Left sinus exhibits no maxillary sinus tenderness and no frontal sinus tenderness.  Mouth/Throat: Mucous membranes are normal. Posterior oropharyngeal erythema present. No posterior oropharyngeal edema.  Eyes: Conjunctivae are normal.  Cardiovascular: Normal rate and regular rhythm.   Pulmonary/Chest: Effort normal and breath sounds normal. No respiratory distress.  Abdominal: Soft.  Musculoskeletal: Normal range of motion.       Arms: Neurological: She is alert and oriented to person, place, and time. No cranial nerve deficit.  Skin: Skin is warm and dry.  Psychiatric: She has a normal mood and affect. Her behavior is normal. Judgment and thought content normal.  Nursing note and vitals reviewed.         Assessment & Plan:   Lipoma - Plan: Ambulatory referral to General Surgery  Acute upper respiratory infection  Generalized anxiety disorder - Plan: LORazepam (ATIVAN) 0.5 MG tablet  Need for influenza vaccination - Plan: Flu Vaccine QUAD 36+ mos PF IM (Fluarix & Fluzone Quad PF) No Follow-up on file.

## 2015-08-27 ENCOUNTER — Ambulatory Visit: Payer: Self-pay | Admitting: Surgery

## 2015-08-28 ENCOUNTER — Other Ambulatory Visit: Payer: Self-pay | Admitting: Surgery

## 2015-08-28 DIAGNOSIS — R221 Localized swelling, mass and lump, neck: Secondary | ICD-10-CM

## 2015-08-30 ENCOUNTER — Ambulatory Visit
Admission: RE | Admit: 2015-08-30 | Discharge: 2015-08-30 | Disposition: A | Discharge status: Home / Self Care | Payer: 59 | Source: Ambulatory Visit | Attending: Surgery | Admitting: Surgery

## 2015-08-30 DIAGNOSIS — R221 Localized swelling, mass and lump, neck: Secondary | ICD-10-CM

## 2015-08-30 MED ORDER — IOPAMIDOL (ISOVUE-300) INJECTION 61%
75.0000 mL | Freq: Once | INTRAVENOUS | Status: AC | PRN
  Administered 2015-08-30: 75 mL via INTRAVENOUS

## 2015-09-02 ENCOUNTER — Other Ambulatory Visit: Payer: Self-pay | Admitting: Surgery

## 2015-09-02 DIAGNOSIS — R229 Localized swelling, mass and lump, unspecified: Secondary | ICD-10-CM

## 2015-09-09 ENCOUNTER — Other Ambulatory Visit: Payer: 59

## 2015-09-18 ENCOUNTER — Ambulatory Visit
Admission: RE | Admit: 2015-09-18 | Discharge: 2015-09-18 | Disposition: A | Discharge status: Home / Self Care | Payer: 59 | Source: Ambulatory Visit | Attending: Surgery | Admitting: Surgery

## 2015-09-18 MED ORDER — IOPAMIDOL (ISOVUE-300) INJECTION 61%
75.0000 mL | Freq: Once | INTRAVENOUS | Status: AC | PRN
  Administered 2015-09-18: 75 mL via INTRAVENOUS

## 2015-09-30 ENCOUNTER — Telehealth: Payer: Self-pay | Admitting: Cardiovascular Disease

## 2015-09-30 NOTE — Telephone Encounter (Signed)
Received records from [facility] for appointment with Dr Gwenlyn Found on 10/08/15.  Records given to [facility] (medical records) for Dr Kennon Holter schedule on 10/08/15. lp

## 2015-10-08 ENCOUNTER — Encounter: Payer: Self-pay | Admitting: Cardiovascular Disease

## 2015-10-08 ENCOUNTER — Ambulatory Visit (INDEPENDENT_AMBULATORY_CARE_PROVIDER_SITE_OTHER): Payer: 59 | Admitting: Cardiovascular Disease

## 2015-10-08 VITALS — BP 136/98 | HR 78 | Ht 66.0 in | Wt 247.0 lb

## 2015-10-08 DIAGNOSIS — R079 Chest pain, unspecified: Secondary | ICD-10-CM | POA: Diagnosis not present

## 2015-10-08 DIAGNOSIS — R222 Localized swelling, mass and lump, trunk: Secondary | ICD-10-CM | POA: Insufficient documentation

## 2015-10-08 NOTE — Assessment & Plan Note (Signed)
Recent CT scan scan showed an anterior mediastinal mass measuring at least 4 cm.the patient was referred to me for further evaluation of this. This was not within my field of expertise and suggested that she be seen by the thoracic surgeons at [place].

## 2015-10-08 NOTE — Patient Instructions (Signed)
Medication Instructions:  none  Labwork: none  Testing/Procedures: none  Follow-Up: Follow up with Dr. Gwenlyn Found as needed.  Contact [facility] [phone]  Any Other Special Instructions Will Be Listed Below (If Applicable).     If you need a refill on your cardiac medications before your next appointment, please call your pharmacy.

## 2015-10-08 NOTE — Progress Notes (Signed)
This was referred to me by Dr. Linden Dolin for a second opinion regarding an anterior mediastinal mass found incidentally by chest CT. This is not with him I feel that T's and I suggested that she would be better served by seeing a thoracic surgeon. I will see her back as needed

## 2016-01-22 ENCOUNTER — Ambulatory Visit (INDEPENDENT_AMBULATORY_CARE_PROVIDER_SITE_OTHER): Payer: 59 | Admitting: Internal Medicine

## 2016-01-22 ENCOUNTER — Encounter: Payer: Self-pay | Admitting: Internal Medicine

## 2016-01-22 VITALS — BP 144/96 | HR 89 | Temp 97.8°F | Wt 250.0 lb

## 2016-01-22 DIAGNOSIS — L509 Urticaria, unspecified: Secondary | ICD-10-CM

## 2016-01-22 NOTE — Progress Notes (Signed)
Pre visit review using our clinic review tool, if applicable. No additional management support is needed unless otherwise documented below in the visit note. 

## 2016-01-22 NOTE — Progress Notes (Signed)
Subjective:    Patient ID: Virginia Oneal, female    DOB: [birth year], 50 y.o.   MRN: [id number]  HPI  Pt presents to the clinic today with c/o hives. She reports this started 5 days ago. It started on her chest, and spread to her arms, back and legs. It was very itchy. The hives have resolved as of last night. She denies recent changes in medications, soaps, lotions or detergents. She has been stressed lately planning a trip for her church. She gets this at least 1-2 times per year, and can not tell if is stress or allergy related. She took Cholratab with some relief.  Review of Systems      Past Medical History  Diagnosis Date  . Kidney stones   . Depression   . Abnormal uterine bleeding   . Anxiety     postpartum and hx panic attacks  . Urinary incontinence 05/2009    hx bladder sling--Dr. Quincy Simmonds  . Hx of migraines   . Personal history of colonic polyps   . Arrhythmia   . Chest mass     by CT scanning    Current Outpatient Prescriptions  Medication Sig Dispense Refill  . LORazepam (ATIVAN) 0.5 MG tablet Take 1 tablet (0.5 mg total) by mouth every 8 (eight) hours as needed for anxiety. 60 tablet 0   No current facility-administered medications for this visit.    Allergies  Allergen Reactions  . Buspar [Buspirone Hcl]     Worsened her panic attacks    Family History  Problem Relation Age of Onset  . Kidney disease Mother     renal failure  . Stroke Mother   . Diabetes Mother   . Cancer Mother     colon  . Colon cancer Mother   . Cancer - Colon Mother     deceased age 46  . Hypertension Mother   . Heart failure Mother   . Stroke Father   . Cancer Maternal Aunt     colon  . Colon cancer Maternal Aunt   . Hypertension Sister   . Rheum arthritis Sister   . Hypertension Brother   . Cancer - Colon Paternal Aunt     deceased  . Esophageal cancer Neg Hx   . Rectal cancer Neg Hx   . Stomach cancer Neg Hx   . Hypertension Sister   . Hypertension Sister   .  Hypertension Sister     Social History   Social History  . Marital Status: Married    Spouse Name: N/A  . Number of Children: 2  . Years of Education: N/A   Occupational History  . [employer]    Social History Main Topics  . Smoking status: Former Smoker    Types: Cigarettes    Quit date: 01/06/1999  . Smokeless tobacco: Never Used  . Alcohol Use: No  . Drug Use: No  . Sexual Activity:    Partners: Male    Birth Control/ Protection: Other-see comments, Surgical     Comment: TAH/LSO   Other Topics Concern  . Not on file   Social History Narrative   ** Merged History Encounter **         Constitutional: Denies fever, malaise, fatigue, headache or abrupt weight changes.  HEENT: Denies eye pain, eye redness, ear pain, ringing in the ears, wax buildup, runny nose, nasal congestion, bloody nose, or sore throat. Respiratory: Denies difficulty breathing, shortness of breath, cough or sputum production.  Cardiovascular: Denies chest pain, chest tightness, palpitations or swelling in the hands or feet.  Skin: Pt reports hives. Denies lesions or ulcercations.    No other specific complaints in a complete review of systems (except as listed in HPI above).  Objective:   Physical Exam BP 144/96 mmHg  Pulse 89  Temp(Src) 97.8 F (36.6 C) (Oral)  Wt 250 lb (113.399 kg)  SpO2 95% Wt Readings from Last 3 Encounters:  01/22/16 250 lb (113.399 kg)  10/08/15 247 lb (112.038 kg)  08/13/15 248 lb 8 oz (112.719 kg)    General: Appears her stated age, obese in NAD. Skin: Warm, dry and intact. No rashes noted. HEENT: Head: normal shape and size; Eyes: sclera white, no icterus, conjunctiva pink, PERRLA and EOMs intact; Teeth present, mucosa pink and moist, no exudate, lesions or ulcerations noted.  Cardiovascular: Normal rate and rhythm. S1,S2 noted.  No murmur, rubs or gallops noted. Pulmonary/Chest: Normal effort and positive vesicular breath sounds. No respiratory  distress. No wheezes, rales or ronchi noted.    BMET    Component Value Date/Time   NA 139 07/26/2014 1230   K 3.9 07/26/2014 1230   CL 104 07/26/2014 1230   CO2 27 07/26/2014 1230   GLUCOSE 83 07/26/2014 1230   BUN 14 07/26/2014 1230   CREATININE 0.7 07/26/2014 1230   CREATININE 0.65 02/02/2014 1146   CALCIUM 9.7 07/26/2014 1230   GFRNONAA >60 05/22/2009 0555   GFRAA  05/22/2009 0555    >60        The eGFR has been calculated using the MDRD equation. This calculation has not been validated in all clinical situations. eGFR's persistently <60 mL/min signify possible Chronic Kidney Disease.    Lipid Panel     Component Value Date/Time   CHOL 183 02/02/2014 1146   TRIG 176* 02/02/2014 1146   HDL 51 02/02/2014 1146   CHOLHDL 3.6 02/02/2014 1146   VLDL 35 02/02/2014 1146   LDLCALC 97 02/02/2014 1146    CBC    Component Value Date/Time   WBC 8.8 07/26/2014 1230   RBC 4.04 07/26/2014 1230   HGB 12.1 07/26/2014 1230   HCT 36.4 07/26/2014 1230   PLT 161.0 07/26/2014 1230   MCV 90.2 07/26/2014 1230   MCH 29.7 02/02/2014 1146   MCHC 33.2 07/26/2014 1230   RDW 13.3 07/26/2014 1230   MONOABS 0.7 09/24/2008 0924   EOSABS 0.1 09/24/2008 0924   BASOSABS 0.0 09/24/2008 0924    Hgb A1C Lab Results  Component Value Date   HGBA1C 5.9* 02/02/2014          Assessment & Plan:   Hives:  Resolved Could be allergy versus stress Advised her to try taking Zyrtec daily x 2 weeks in Spring and Fall when the seasons change to see if this helps No indication for steroids at this time  RTC as needed or if symptoms return

## 2016-01-22 NOTE — Patient Instructions (Signed)
Hives Hives are itchy, red, swollen areas of the skin. They can vary in size and location on your body. Hives can come and go for hours or several days (acute hives) or for several weeks (chronic hives). Hives do not spread from person to person (noncontagious). They may get worse with scratching, exercise, and emotional stress. CAUSES   Allergic reaction to food, additives, or drugs.  Infections, including the common cold.  Illness, such as vasculitis, lupus, or thyroid disease.  Exposure to sunlight, heat, or cold.  Exercise.  Stress.  Contact with chemicals. SYMPTOMS   Red or white swollen patches on the skin. The patches may change size, shape, and location quickly and repeatedly.  Itching.  Swelling of the hands, feet, and face. This may occur if hives develop deeper in the skin. DIAGNOSIS  Your caregiver can usually tell what is wrong by performing a physical exam. Skin or blood tests may also be done to determine the cause of your hives. In some cases, the cause cannot be determined. TREATMENT  Mild cases usually get better with medicines such as antihistamines. Severe cases may require an emergency epinephrine injection. If the cause of your hives is known, treatment includes avoiding that trigger.  HOME CARE INSTRUCTIONS   Avoid causes that trigger your hives.  Take antihistamines as directed by your caregiver to reduce the severity of your hives. Non-sedating or low-sedating antihistamines are usually recommended. Do not drive while taking an antihistamine.  Take any other medicines prescribed for itching as directed by your caregiver.  Wear loose-fitting clothing.  Keep all follow-up appointments as directed by your caregiver. SEEK MEDICAL CARE IF:   You have persistent or severe itching that is not relieved with medicine.  You have painful or swollen joints. SEEK IMMEDIATE MEDICAL CARE IF:   You have a fever.  Your tongue or lips are swollen.  You have  trouble breathing or swallowing.  You feel tightness in the throat or chest.  You have abdominal pain. These problems may be the first sign of a life-threatening allergic reaction. Call your local emergency services (911 in [place] MAKE SURE YOU:   Understand these instructions.  Will watch your condition.  Will get help right away if you are not doing well or get worse.   This information is not intended to replace advice given to you by your health care provider. Make sure you discuss any questions you have with your health care provider.   Document Released: 09/28/2005 Document Revised: 10/03/2013 Document Reviewed: 12/22/2011 Elsevier Interactive Patient Education 2016 Elsevier Inc.  

## 2016-02-19 ENCOUNTER — Ambulatory Visit: Payer: 59 | Admitting: Obstetrics and Gynecology

## 2016-02-24 ENCOUNTER — Telehealth: Payer: Self-pay

## 2016-02-24 NOTE — Telephone Encounter (Signed)
PLEASE NOTE: All timestamps contained within this report are represented as [place]. CONFIDENTIALTY NOTICE: This fax transmission is intended only for the addressee. It contains information that is legally privileged, confidential or otherwise protected from use or disclosure. If you are not the intended recipient, you are strictly prohibited from reviewing, disclosing, copying using or disseminating any of this information or taking any action in reliance on or regarding this information. If you have received this fax in error, please notify us immediately by telephone so that we can arrange for its return to us. Phone: [phone], Toll-Free: [phone], Fax: [phone] Page: 1 of 2 Call Id: [id number] [facility] - Client TELEPHONE ADVICE RECORD [facility] Patient Name: Virginia RichardsZELLA Fitzgibbon Gender: Female DOB: August 03, 1966 Age: [age] Y 9 M 14 D Return Phone Number: [phone] (Primary), [phone] (Secondary) Address: City/State/Zip: [place] Client [facility] Physician [place], Idamae SchullerMarne - MD Contact Type Call Who Is Calling Patient / Member / Family / Caregiver Call Type Triage / Clinical Relationship To Patient Self Return Phone Number [phone] (Primary) Chief Complaint Sore Throat Reason for Call Symptomatic / Request for Health Information Initial Comment Caller states c/o sore throat, earache, sinus congestion PreDisposition Go to Urgent Care/Walk-In Clinic Translation No Nurse Assessment Nurse: Anner CreteWells, RN, Massie BougieBelinda Date/Time (Eastern Time): 02/23/2016 8:19:02 AM Confirm and document reason for call. If symptomatic, describe symptoms. You must click the next button to save text entered. ---Caller states that she is having sore throat, sinus congestion, and woke up today with her ear throbbing. She thought it was getting better but  yesterday her symptoms seemed to get worse. She hasn't been taking her temperature. Has the patient traveled out of the country within the last 30 days? ---Not Applicable Does the patient have any new or worsening symptoms? ---Yes Will a triage be completed? ---Yes Related visit to physician within the last 2 weeks? ---No Does the PT have any chronic conditions? (i.e. diabetes, asthma, etc.) ---No Is the patient pregnant or possibly pregnant? (Ask all females between the ages of 5712-55) ---No Is this a behavioral health or substance abuse call? ---No Guidelines Guideline Title Affirmed Question Affirmed Notes Nurse Date/Time (Eastern Time) Earache Earache (Exceptions: brief ear pain of < 60 minutes duration, earache occurring during air travel JanesvilleWells, RN, Belinda 02/23/2016 8:21:35 AM Disp. Time Lamount Cohen(Eastern Time) Disposition Final User 02/23/2016 8:17:44 AM Send To Clinical Follow Up Darrick PennaQueue Green, Amy PLEASE NOTE: All timestamps contained within this report are represented as [place] Standard Time. CONFIDENTIALTY NOTICE: This fax transmission is intended only for the addressee. It contains information that is legally privileged, confidential or otherwise protected from use or disclosure. If you are not the intended recipient, you are strictly prohibited from reviewing, disclosing, copying using or disseminating any of this information or taking any action in reliance on or regarding this information. If you have received this fax in error, please notify us immediately by telephone so that we can arrange for its return to us. Phone: [phone], Toll-Free: [phone], Fax: [phone] Page: 2 of 2 Call Id: [id number] 02/23/2016 8:25:44 AM See Physician within 24 Hours Yes Anner CreteWells, RN, Baltazar ApoBelinda Caller Understands: Yes Disagree/Comply: Comply Care Advice Given Per Guideline SEE PHYSICIAN WITHIN 24 HOURS: * IF OFFICE WILL BE OPEN: You need to be seen within the next 24 hours. Call your doctor  when the office opens, and make an appointment. * IF  OFFICE WILL BE CLOSED AND NO PCP TRIAGE: You need to be seen within the next 24 hours. An urgent care center is often a good source of care if your doctor's office is closed. REASSURANCE: * You may have an ear infection or some other cause of ear pain; but it doesn't sound serious. You need to be examined in the next 24 hours to find out the exact cause. * In the meantime, the following measures may help. PAIN MEDICINES: * For pain relief, take acetaminophen, ibuprofen, or naproxen. * Use the lowest amount that makes your pain feel better. ACETAMINOPHEN (E.G., TYLENOL): * Take 650 mg (two 325 mg pills) by mouth every 4-6 hours as needed. Each Regular Strength Tylenol pill has 325 mg of acetaminophen. The most you should take each day is 3,250 mg (10 Regular Strength pills a day). * Another choice is to take 1,000 mg (two 500 mg pills) every 8 hours as needed. Each Extra Strength Tylenol pill has 500 mg of acetaminophen. The most you should take each day is 3,000 mg (6 Extra Strength pills a day). IBUPROFEN (E.G., MOTRIN, ADVIL): * Take 400 mg (two 200 mg pills) by mouth every 6 hours as needed. * Another choice is to take 600 mg (three 200 mg pills) by mouth every 8 hours as needed. * The most you should take each day is 1,200 mg (six 200 mg pills a day), unless your doctor has told you to take more. LOCAL COLD: * Apply a cold pack or a cold wet washcloth to outer ear for 20 min. to reduce pain while medicine takes effect (Note: some adults prefer local heat for 20 minutes) * CAUTION: hot or cold pack applied too long could cause burn or frostbite. CALL BACK IF: * Severe pain persists over 2 hours after pain medicine * You become worse. CARE ADVICE given per Earache (Adult) guideline. Comments User: Demetrios Isaacs, RN Date/Time (Eastern Time): 02/23/2016 8:30:17 AM Caller asked what she could do to reduce the pressure in her ears. Advised caller  that if she has an ear infection then she would be placed on antibiotics but she can also do nasal washings to help loosen and thin her nasal secretions which will then help to release some of that pressure she has on her ear. Advised that she can buy nasal washing kits OTC or she can use saline spray and put three drops inside each of her nostril and then hold one side down while suction the other. Advised per Schmidt-Thompson Guidelines and Care Advice. Referrals [facility]

## 2016-03-06 ENCOUNTER — Encounter: Payer: Self-pay | Admitting: Primary Care

## 2016-03-06 ENCOUNTER — Ambulatory Visit (INDEPENDENT_AMBULATORY_CARE_PROVIDER_SITE_OTHER): Payer: 59 | Admitting: Primary Care

## 2016-03-06 VITALS — BP 138/84 | HR 76 | Temp 97.8°F | Ht 66.0 in | Wt 247.8 lb

## 2016-03-06 DIAGNOSIS — H938X3 Other specified disorders of ear, bilateral: Secondary | ICD-10-CM

## 2016-03-06 NOTE — Patient Instructions (Signed)
Your symptoms are related to seasonal allergies.  Start Zyrtec at bedtime for post nasal drip and ear fullness.  Try using Flonase (fluticasone) nasal spray for ear fullness. Instill 2 sprays in each nostril once daily.   Continue this regimen for at least 2-3 weeks.  Ensure you are staying hydrated with water. Please call us if no improvement in 1 week.  It was a pleasure meeting you!

## 2016-03-06 NOTE — Progress Notes (Signed)
Pre visit review using our clinic review tool, if applicable. No additional management support is needed unless otherwise documented below in the visit note. 

## 2016-03-06 NOTE — Progress Notes (Signed)
Subjective:    Patient ID: Virginia Oneal, female    DOB: 02-11-1966, 50 y.o.   MRN: [id number]  HPI  Virginia Oneal is a 50 year old female who presents today with a chief complaint of ear fullness. She was evaluated at [facility] on 05/14 and was prescribed a 10 day supply of Amoxicillin 875 mg.   Her symptoms are mostly bothersome to her right ear. She feels as though she's "under water". She continues to experience post nasal drip, sore throat, with cough that has been present for 3+ weeks. She's taken Mucinex for several days without much improvement. Denies fevers, shortness of breath, wheezing, sick contacts. She's not taken a daily antihistamine.   Review of Systems  Constitutional: Negative for fever, chills and fatigue.  HENT: Positive for postnasal drip, sneezing and sore throat.        Ear fullness  Respiratory: Positive for cough. Negative for shortness of breath.   Cardiovascular: Negative for chest pain.       Past Medical History  Diagnosis Date  . Kidney stones   . Depression   . Abnormal uterine bleeding   . Anxiety     postpartum and hx panic attacks  . Urinary incontinence 05/2009    hx bladder sling--Dr. Quincy Simmonds  . Hx of migraines   . Personal history of colonic polyps   . Arrhythmia   . Chest mass     by CT scanning     Social History   Social History  . Marital Status: Married    Spouse Name: N/A  . Number of Children: 2  . Years of Education: N/A   Occupational History  . [employer]    Social History Main Topics  . Smoking status: Former Smoker    Types: Cigarettes    Quit date: 01/06/1999  . Smokeless tobacco: Never Used  . Alcohol Use: No  . Drug Use: No  . Sexual Activity:    Partners: Male    Birth Control/ Protection: Other-see comments, Surgical     Comment: TAH/LSO   Other Topics Concern  . Not on file   Social History Narrative   ** Merged History Encounter **        Past Surgical History  Procedure Laterality Date  .  Cesarean section  03/2003  . Vaginal hysterectomy  05/21/2009    lap assisted /partial  . Laparoscopic endometriosis fulguration      ablation  . Incontinence surgery      sling for urinary incontinence  . Colonoscopy  09/08  . Tympanoplasty      right ear  . Colporrhaphy  05/2009    Dr. Quincy Simmonds  . Abdominal hysterectomy  05/2009    TAH/LSO  . Cesarean section  2004  . Bladder suspension  2010    Dr. Quincy Simmonds  . Endometrial ablation  2006    Dr. Quincy Simmonds  . Oophorectomy  2010    w/hyst--Dr. Quincy Simmonds  . Tubal ligation  2004    w/ C-section Dr. Quincy Simmonds    Family History  Problem Relation Age of Onset  . Kidney disease Mother     renal failure  . Stroke Mother   . Diabetes Mother   . Cancer Mother     colon  . Colon cancer Mother   . Cancer - Colon Mother     deceased age 98  . Hypertension Mother   . Heart failure Mother   . Stroke Father   . Cancer Maternal Aunt  colon  . Colon cancer Maternal Aunt   . Hypertension Sister   . Rheum arthritis Sister   . Hypertension Brother   . Cancer - Colon Paternal Aunt     deceased  . Esophageal cancer Neg Hx   . Rectal cancer Neg Hx   . Stomach cancer Neg Hx   . Hypertension Sister   . Hypertension Sister   . Hypertension Sister     Allergies  Allergen Reactions  . Buspar [Buspirone Hcl]     Worsened her panic attacks    Current Outpatient Prescriptions on File Prior to Visit  Medication Sig Dispense Refill  . LORazepam (ATIVAN) 0.5 MG tablet Take 1 tablet (0.5 mg total) by mouth every 8 (eight) hours as needed for anxiety. 60 tablet 0   No current facility-administered medications on file prior to visit.    BP 138/84 mmHg  Pulse 76  Temp(Src) 97.8 F (36.6 C) (Oral)  Ht 5\' 6"  (1.676 m)  Wt 247 lb 12.8 oz (112.401 kg)  BMI 40.01 kg/m2  SpO2 98%    Objective:   Physical Exam  Constitutional: She appears well-nourished.  HENT:  Right Ear: Tympanic membrane and ear canal normal.  Left Ear: Ear canal normal. A  middle ear effusion is present.  Nose: Right sinus exhibits no maxillary sinus tenderness and no frontal sinus tenderness. Left sinus exhibits no maxillary sinus tenderness and no frontal sinus tenderness.  Mouth/Throat: Oropharynx is clear and moist.  Eyes: Conjunctivae are normal.  Neck: Neck supple.  Cardiovascular: Normal rate and regular rhythm.   Pulmonary/Chest: Effort normal and breath sounds normal. She has no wheezes. She has no rales.  Lymphadenopathy:    She has no cervical adenopathy.  Skin: Skin is warm and dry.          Assessment & Plan:  Seasonal Allergies:  Ear fullness, post nasal drip, cough x 3 weeks. Treated on 10 day course of Amoxicillin, completed this 2 days ago. Exam without evidence of bacterial infection to either ears, lungs clear. Suspect symptoms related to allergies and will treat with supportive measures. Flonase for ear fullness, Zyrtec for PND and allergy symptoms, daily for the next 2-3 weeks. Discussed return precautions.

## 2016-03-19 ENCOUNTER — Other Ambulatory Visit: Payer: Self-pay | Admitting: Obstetrics and Gynecology

## 2016-03-19 DIAGNOSIS — Z1231 Encounter for screening mammogram for malignant neoplasm of breast: Secondary | ICD-10-CM

## 2016-04-06 ENCOUNTER — Ambulatory Visit
Admission: RE | Admit: 2016-04-06 | Discharge: 2016-04-06 | Disposition: A | Discharge status: Home / Self Care | Payer: 59 | Source: Ambulatory Visit | Attending: Obstetrics and Gynecology | Admitting: Obstetrics and Gynecology

## 2016-04-06 DIAGNOSIS — Z1231 Encounter for screening mammogram for malignant neoplasm of breast: Secondary | ICD-10-CM

## 2016-08-21 ENCOUNTER — Encounter: Payer: Self-pay | Admitting: Family Medicine

## 2016-08-21 ENCOUNTER — Ambulatory Visit (INDEPENDENT_AMBULATORY_CARE_PROVIDER_SITE_OTHER): Payer: 59 | Admitting: Family Medicine

## 2016-08-21 VITALS — BP 138/84 | HR 92 | Temp 98.3°F | Resp 12 | Wt 250.0 lb

## 2016-08-21 DIAGNOSIS — J069 Acute upper respiratory infection, unspecified: Secondary | ICD-10-CM

## 2016-08-21 MED ORDER — AZITHROMYCIN 250 MG PO TABS: ORAL_TABLET | ORAL | 0 refills | Status: DC

## 2016-08-21 MED ORDER — HYDROCOD POLST-CPM POLST ER 10-8 MG/5ML PO SUER: 5.0000 mL | Freq: Every evening | ORAL | 0 refills | Status: DC | PRN

## 2016-08-21 NOTE — Patient Instructions (Signed)
Upper Respiratory Infection, Adult Most upper respiratory infections (URIs) are a viral infection of the air passages leading to the lungs. A URI affects the nose, throat, and upper air passages. The most common type of URI is nasopharyngitis and is typically referred to as "the common cold." URIs run their course and usually go away on their own. Most of the time, a URI does not require medical attention, but sometimes a bacterial infection in the upper airways can follow a viral infection. This is called a secondary infection. Sinus and middle ear infections are common types of secondary upper respiratory infections. Bacterial pneumonia can also complicate a URI. A URI can worsen asthma and chronic obstructive pulmonary disease (COPD). Sometimes, these complications can require emergency medical care and may be life threatening.  CAUSES Almost all URIs are caused by viruses. A virus is a type of germ and can spread from one person to another.  RISKS FACTORS You may be at risk for a URI if:   You smoke.   You have chronic heart or lung disease.  You have a weakened defense (immune) system.   You are very young or very old.   You have nasal allergies or asthma.  You work in crowded or poorly ventilated areas.  You work in health care facilities or schools. SIGNS AND SYMPTOMS  Symptoms typically develop 2-3 days after you come in contact with a cold virus. Most viral URIs last 7-10 days. However, viral URIs from the influenza virus (flu virus) can last 14-18 days and are typically more severe. Symptoms may include:   Runny or stuffy (congested) nose.   Sneezing.   Cough.   Sore throat.   Headache.   Fatigue.   Fever.   Loss of appetite.   Pain in your forehead, behind your eyes, and over your cheekbones (sinus pain).  Muscle aches.  DIAGNOSIS  Your health care provider may diagnose a URI by:  Physical exam.  Tests to check that your symptoms are not due to  another condition such as:  Strep throat.  Sinusitis.  Pneumonia.  Asthma. TREATMENT  A URI goes away on its own with time. It cannot be cured with medicines, but medicines may be prescribed or recommended to relieve symptoms. Medicines may help:  Reduce your fever.  Reduce your cough.  Relieve nasal congestion. HOME CARE INSTRUCTIONS   Take medicines only as directed by your health care provider.   Gargle warm saltwater or take cough drops to comfort your throat as directed by your health care provider.  Use a warm mist humidifier or inhale steam from a shower to increase air moisture. This may make it easier to breathe.  Drink enough fluid to keep your urine clear or pale yellow.   Eat soups and other clear broths and maintain good nutrition.   Rest as needed.   Return to work when your temperature has returned to normal or as your health care provider advises. You may need to stay home longer to avoid infecting others. You can also use a face mask and careful hand washing to prevent spread of the virus.  Increase the usage of your inhaler if you have asthma.   Do not use any tobacco products, including cigarettes, chewing tobacco, or electronic cigarettes. If you need help quitting, ask your health care provider. PREVENTION  The best way to protect yourself from getting a cold is to practice good hygiene.   Avoid oral or hand contact with people with cold   symptoms.   Wash your hands often if contact occurs.  There is no clear evidence that vitamin C, vitamin E, echinacea, or exercise reduces the chance of developing a cold. However, it is always recommended to get plenty of rest, exercise, and practice good nutrition.  SEEK MEDICAL CARE IF:   You are getting worse rather than better.   Your symptoms are not controlled by medicine.   You have chills.  You have worsening shortness of breath.  You have brown or red mucus.  You have yellow or brown nasal  discharge.  You have pain in your face, especially when you bend forward.  You have a fever.  You have swollen neck glands.  You have pain while swallowing.  You have white areas in the back of your throat. SEEK IMMEDIATE MEDICAL CARE IF:   You have severe or persistent:  Headache.  Ear pain.  Sinus pain.  Chest pain.  You have chronic lung disease and any of the following:  Wheezing.  Prolonged cough.  Coughing up blood.  A change in your usual mucus.  You have a stiff neck.  You have changes in your:  Vision.  Hearing.  Thinking.  Mood. MAKE SURE YOU:   Understand these instructions.  Will watch your condition.  Will get help right away if you are not doing well or get worse.   This information is not intended to replace advice given to you by your health care provider. Make sure you discuss any questions you have with your health care provider.   Document Released: 03/24/2001 Document Revised: 02/12/2015 Document Reviewed: 01/03/2014 Elsevier Interactive Patient Education 2016 Elsevier Inc.  

## 2016-08-21 NOTE — Progress Notes (Signed)
Pre visit review using our clinic review tool, if applicable. No additional management support is needed unless otherwise documented below in the visit note. 

## 2016-08-21 NOTE — Progress Notes (Signed)
Subjective:    Patient ID: Virginia Oneal, female    DOB: August 08, 1966, 50 y.o.   MRN: [id number]  HPI This is a 50 yo female who presents today with cough x 4 weeks. Started out as sore throat and moved to head and chest. Continues to have green nasal drainage and sinus headache. Coughs all night, back and chest sore. Ran a fever initially then none. Feels fatigued due to poor sleep. Felt like she was getting better and then started to feel worse.   She requests alprazolam refill today. Uses rarely, still has some at home. Has not seen PCP in over 1 year- she was instructed to make appointment with PCP for refill.   Past Medical History:  Diagnosis Date  . Abnormal uterine bleeding   . Anxiety    postpartum and hx panic attacks  . Arrhythmia   . Chest mass    by CT scanning  . Depression   . Hx of migraines   . Kidney stones   . Personal history of colonic polyps   . Urinary incontinence 05/2009   hx bladder sling--Dr. Quincy Simmonds   Past Surgical History:  Procedure Laterality Date  . ABDOMINAL HYSTERECTOMY  05/2009   TAH/LSO  . BLADDER SUSPENSION  2010   Dr. Quincy Simmonds  . CESAREAN SECTION  03/2003  . CESAREAN SECTION  2004  . COLONOSCOPY  09/08  . COLPORRHAPHY  05/2009   Dr. Quincy Simmonds  . ENDOMETRIAL ABLATION  2006   Dr. Quincy Simmonds  . INCONTINENCE SURGERY     sling for urinary incontinence  . LAPAROSCOPIC ENDOMETRIOSIS FULGURATION     ablation  . OOPHORECTOMY  2010   w/hyst--Dr. Quincy Simmonds  . TUBAL LIGATION  2004   w/ C-section Dr. Quincy Simmonds  . TYMPANOPLASTY     right ear  . VAGINAL HYSTERECTOMY  05/21/2009   lap assisted /partial   Family History  Problem Relation Age of Onset  . Kidney disease Mother     renal failure  . Stroke Mother   . Diabetes Mother   . Cancer Mother     colon  . Colon cancer Mother   . Cancer - Colon Mother     deceased age 54  . Hypertension Mother   . Heart failure Mother   . Stroke Father   . Cancer Maternal Aunt     colon  . Colon cancer Maternal Aunt     . Hypertension Sister   . Rheum arthritis Sister   . Hypertension Brother   . Cancer - Colon Paternal Aunt     deceased  . Esophageal cancer Neg Hx   . Rectal cancer Neg Hx   . Stomach cancer Neg Hx   . Hypertension Sister   . Hypertension Sister   . Hypertension Sister    Social History  Substance Use Topics  . Smoking status: Former Smoker    Types: Cigarettes    Quit date: 01/06/1999  . Smokeless tobacco: Never Used  . Alcohol use No      Review of Systems Per HPI    Objective:   Physical Exam  Constitutional: She is oriented to person, place, and time. She appears well-developed and well-nourished. She appears ill. No distress.  HENT:  Head: Normocephalic and atraumatic.  Right Ear: Tympanic membrane, external ear and ear canal normal.  Left Ear: Tympanic membrane, external ear and ear canal normal.  Nose: Mucosal edema and rhinorrhea present. Right sinus exhibits no maxillary sinus tenderness and no frontal sinus  tenderness. Left sinus exhibits no maxillary sinus tenderness and no frontal sinus tenderness.  Mouth/Throat: Uvula is midline. Posterior oropharyngeal erythema present.  Sounds congested, frequent throat clearing, frequent non productive, moist cough.   Cardiovascular: Normal rate, regular rhythm and normal heart sounds.   Pulmonary/Chest: Effort normal and breath sounds normal.  Neurological: She is alert and oriented to person, place, and time.  Skin: Skin is warm and dry. She is not diaphoretic.  Psychiatric: She has a normal mood and affect. Her behavior is normal. Judgment and thought content normal.  Vitals reviewed.     BP 138/84 (BP Location: Left Arm, Patient Position: Sitting, Cuff Size: Normal)   Pulse 92   Temp 98.3 F (36.8 C) (Oral)   Resp 12   Wt 250 lb (113.4 kg)   SpO2 97%   BMI 40.35 kg/m  Wt Readings from Last 3 Encounters:  08/21/16 250 lb (113.4 kg)  03/06/16 247 lb 12.8 oz (112.4 kg)  01/22/16 250 lb (113.4 kg)        Assessment & Plan:  1. Acute upper respiratory infection - given prolonged symptoms and improvement followed by worsening, will cover for bacterial infection - RTC precautions reviwed - resume Zyrtec - azithromycin (ZITHROMAX) 250 MG tablet; Take 2 tabs PO x 1 dose, then 1 tab PO QD x 4 days  Dispense: 6 tablet; Refill: 0 - chlorpheniramine-HYDROcodone (TUSSIONEX PENNKINETIC ER) 10-8 MG/5ML SUER; Take 5 mLs by mouth at bedtime as needed for cough.  Dispense: 140 mL; Refill: 0   Clarene Reamer, FNP-BC  [facility]  08/21/2016 11:17 AM

## 2017-02-23 ENCOUNTER — Other Ambulatory Visit: Payer: Self-pay | Admitting: Obstetrics and Gynecology

## 2017-02-23 DIAGNOSIS — Z1231 Encounter for screening mammogram for malignant neoplasm of breast: Secondary | ICD-10-CM

## 2017-02-24 ENCOUNTER — Encounter: Payer: Self-pay | Admitting: Family Medicine

## 2017-02-24 ENCOUNTER — Ambulatory Visit (INDEPENDENT_AMBULATORY_CARE_PROVIDER_SITE_OTHER): Payer: 59 | Admitting: Family Medicine

## 2017-02-24 VITALS — BP 142/82 | HR 81 | Temp 98.0°F | Wt 253.0 lb

## 2017-02-24 DIAGNOSIS — F411 Generalized anxiety disorder: Secondary | ICD-10-CM

## 2017-02-24 DIAGNOSIS — Q341 Congenital cyst of mediastinum: Secondary | ICD-10-CM | POA: Diagnosis not present

## 2017-02-24 DIAGNOSIS — R5383 Other fatigue: Secondary | ICD-10-CM | POA: Diagnosis not present

## 2017-02-24 DIAGNOSIS — R1030 Lower abdominal pain, unspecified: Secondary | ICD-10-CM | POA: Diagnosis not present

## 2017-02-24 DIAGNOSIS — R109 Unspecified abdominal pain: Secondary | ICD-10-CM | POA: Insufficient documentation

## 2017-02-24 LAB — COMPREHENSIVE METABOLIC PANEL
ALBUMIN: 4.4 g/dL (ref 3.5–5.2)
ALK PHOS: 92 U/L (ref 39–117)
ALT: 27 U/L (ref 0–35)
AST: 22 U/L (ref 0–37)
BUN: 18 mg/dL (ref 6–23)
CO2: 31 mEq/L (ref 19–32)
Calcium: 9.6 mg/dL (ref 8.4–10.5)
Chloride: 103 mEq/L (ref 96–112)
Creatinine, Ser: 0.75 mg/dL (ref 0.40–1.20)
GFR: 85.98 mL/min (ref >60.00)
Glucose, Bld: 92 mg/dL (ref 70–99)
POTASSIUM: 4.3 meq/L (ref 3.5–5.1)
Sodium: 139 mEq/L (ref 135–145)
Total Bilirubin: 0.5 mg/dL (ref 0.2–1.2)
Total Protein: 7.3 g/dL (ref 6.0–8.3)

## 2017-02-24 LAB — CBC
HCT: 40.8 % (ref 36.0–46.0)
Hemoglobin: 13.6 g/dL (ref 12.0–15.0)
MCHC: 33.4 g/dL (ref 30.0–36.0)
MCV: 88.5 fl (ref 78.0–100.0)
PLATELETS: 163 10*3/uL (ref 150.0–400.0)
RBC: 4.61 Mil/uL (ref 3.87–5.11)
RDW: 13.9 % (ref 11.5–15.5)
WBC: 8.1 10*3/uL (ref 4.0–10.5)

## 2017-02-24 LAB — H. PYLORI ANTIBODY, IGG: H PYLORI IGG: POSITIVE — AB

## 2017-02-24 LAB — LIPASE: Lipase: 24 U/L (ref 11.0–59.0)

## 2017-02-24 LAB — TSH: TSH: 2.2 u[IU]/mL (ref 0.35–4.50)

## 2017-02-24 LAB — VITAMIN B12: VITAMIN B 12: 185 pg/mL — AB (ref 211–911)

## 2017-02-24 MED ORDER — LORAZEPAM 0.5 MG PO TABS: 0.5000 mg | ORAL_TABLET | Freq: Three times a day (TID) | ORAL | 0 refills | Status: DC | PRN

## 2017-02-24 NOTE — Assessment & Plan Note (Signed)
Exam reassuring and benign. Wide differential including GERD, biliary colic, diverticulitis (non classic for either) ? H pylori. Will check labs today. The patient indicates understanding of these issues and agrees with the plan.

## 2017-02-24 NOTE — Progress Notes (Signed)
Subjective:   Patient ID: Virginia Oneal, female    DOB: [birth year], 51 y.o.   MRN: [id number]  Virginia Oneal is a pleasant 51 y.o. year old female who presents to clinic today with Abdominal Pain (pt is fasting, requesting blood work. Some nausea, pt denies vomiting, diarrhea or constipation. ) and Fatigue  on 02/24/2017  HPI:  6 months of intermittent generalized lower abdominal pain/aching. Sometimes associated with nausea, no vomiting. No changes in her bowel habits or blood in her stool.  Does not feel it is improved or aggravated with food. No fever.  Colonoscopy 4/9/14Carlean Purl- 5 year recall due to polyps.  Has not been taking anything for it.  Also was seeing a surgeon, Dr. Brantley Stage, for mediastinal cyst. CT from 2016 advised she have a repeat CT but lost to follow up.   CLINICAL DATA:  Evaluate cystic lesions seen on recent neck CT in the posterior upper chest.  EXAM: CT CHEST WITH CONTRAST  TECHNIQUE: Multidetector CT imaging of the chest was performed during intravenous contrast administration.  CONTRAST:  34mL ISOVUE-300 IOPAMIDOL (ISOVUE-300) INJECTION 61%  COMPARISON:  Neck CT 08/30/2015  FINDINGS: Mediastinum/Nodes: No breast masses, supraclavicular or axillary lymphadenopathy. The thyroid gland appears normal.  The heart is normal in size. No pericardial effusion. The aorta is normal in caliber. No dissection. The esophagus is grossly normal. No mediastinal or hilar lymphadenopathy. Small scattered lymph nodes are noted. As demonstrated on the prior CT scan there is a cystic lesion in the posterior mediastinum on the left side just posterior to the descending thoracic aorta. This measures 10 Hounsfield units and does not show enhancement. It is most likely a benign neurenteric cyst, esophageal duplication cyst, bronchogenic cyst or epicardial cyst. No worrisome CT imaging features.  Lungs/Pleura: The lungs are clear of acute process. No  worrisome pulmonary lesions. No pleural effusion.  Upper abdomen: Stable left hepatic lobe cyst. No upper abdominal mass or adenopathy.  Musculoskeletal: No significant bony findings.  IMPRESSION: 1. 4.3 x 4.0 x 3.3 cm benign appearing posterior mediastinal cyst on the left side. This is most likely a bronchogenic cyst but could also be an esophageal duplication cyst, neurenteric cyst or epicardial cyst. A followup CT scan in 1 year is suggested to document stability. 2. Normal appearance of the heart and great vessels. 3. No significant pulmonary findings.   Electronically Signed   By: Marijo Sanes M.D.   On: 09/18/2015 13:08 No current outpatient prescriptions on file prior to visit.   No current facility-administered medications on file prior to visit.     Allergies  Allergen Reactions  . Buspar [Buspirone Hcl]     Worsened her panic attacks    Past Medical History:  Diagnosis Date  . Abnormal uterine bleeding   . Anxiety    postpartum and hx panic attacks  . Arrhythmia   . Chest mass    by CT scanning  . Depression   . Hx of migraines   . Kidney stones   . Personal history of colonic polyps   . Urinary incontinence 05/2009   hx bladder sling--Dr. Quincy Simmonds    Past Surgical History:  Procedure Laterality Date  . ABDOMINAL HYSTERECTOMY  05/2009   TAH/LSO  . BLADDER SUSPENSION  2010   Dr. Quincy Simmonds  . CESAREAN SECTION  03/2003  . CESAREAN SECTION  2004  . COLONOSCOPY  09/08  . COLPORRHAPHY  05/2009   Dr. Quincy Simmonds  . ENDOMETRIAL ABLATION  2006  Dr. Quincy Simmonds  . INCONTINENCE SURGERY     sling for urinary incontinence  . LAPAROSCOPIC ENDOMETRIOSIS FULGURATION     ablation  . OOPHORECTOMY  2010   w/hyst--Dr. Quincy Simmonds  . TUBAL LIGATION  2004   w/ C-section Dr. Quincy Simmonds  . TYMPANOPLASTY     right ear  . VAGINAL HYSTERECTOMY  05/21/2009   lap assisted /partial    Family History  Problem Relation Age of Onset  . Kidney disease Mother        renal failure  .  Stroke Mother   . Diabetes Mother   . Cancer Mother        colon  . Colon cancer Mother   . Cancer - Colon Mother        deceased age 37  . Hypertension Mother   . Heart failure Mother   . Stroke Father   . Cancer Maternal Aunt        colon  . Colon cancer Maternal Aunt   . Hypertension Sister   . Rheum arthritis Sister   . Hypertension Brother   . Cancer - Colon Paternal Aunt        deceased  . Esophageal cancer Neg Hx   . Rectal cancer Neg Hx   . Stomach cancer Neg Hx   . Hypertension Sister   . Hypertension Sister   . Hypertension Sister     Social History   Social History  . Marital status: Married    Spouse name: N/A  . Number of children: 2  . Years of education: N/A   Occupational History  . [employer]    Social History Main Topics  . Smoking status: Former Smoker    Types: Cigarettes    Quit date: 01/06/1999  . Smokeless tobacco: Never Used  . Alcohol use No  . Drug use: No  . Sexual activity: Yes    Partners: Male    Birth control/ protection: Other-see comments, Surgical     Comment: TAH/LSO   Other Topics Concern  . Not on file   Social History Narrative   ** Merged History Encounter **       The PMH, PSH, Social History, Family History, Medications, and allergies have been reviewed in [facility], and have been updated if relevant.   Review of Systems  Constitutional: Positive for fatigue. Negative for fever.  Gastrointestinal: Positive for abdominal pain and nausea. Negative for abdominal distention, anal bleeding, blood in stool, constipation, diarrhea, rectal pain and vomiting.  Musculoskeletal: Negative.   All other systems reviewed and are negative.      Objective:    BP (!) 142/82   Pulse 81   Temp 98 F (36.7 C)   Wt 253 lb (114.8 kg)   SpO2 97%   BMI 40.84 kg/m    Physical Exam  Constitutional: She is oriented to person, place, and time. She appears well-developed and well-nourished.  HENT:  Head: Normocephalic and  atraumatic.  Eyes: Conjunctivae are normal.  Cardiovascular: Normal rate.   Pulmonary/Chest: Effort normal.  Abdominal: Soft. Bowel sounds are normal. She exhibits no distension and no mass. There is no tenderness. There is no rebound and no guarding.  Musculoskeletal: Normal range of motion.  Neurological: She is alert and oriented to person, place, and time. No cranial nerve deficit.  Skin: Skin is warm and dry. She is not diaphoretic.  Psychiatric: She has a normal mood and affect. Her behavior is normal. Judgment and thought content normal.  Nursing note  and vitals reviewed.         Assessment & Plan:   Lower abdominal pain - Plan: Lipase, Comprehensive metabolic panel, H. pylori antibody, IgG, CBC, TSH  Fatigue, unspecified type - Plan: Vitamin B12  Mediastinal cyst - Plan: CT CHEST W CONTRAST  Generalized anxiety disorder - Plan: LORazepam (ATIVAN) 0.5 MG tablet No Follow-up on file.

## 2017-02-24 NOTE — Patient Instructions (Signed)
Great to see you. Please stop by to see Marion after you go to the lab. 

## 2017-02-24 NOTE — Assessment & Plan Note (Signed)
Overdue for follow up CT.  Ordered today.

## 2017-02-25 ENCOUNTER — Ambulatory Visit
Admission: RE | Admit: 2017-02-25 | Discharge: 2017-02-25 | Disposition: A | Discharge status: Home / Self Care | Payer: 59 | Source: Ambulatory Visit | Attending: Family Medicine | Admitting: Family Medicine

## 2017-02-25 ENCOUNTER — Other Ambulatory Visit: Payer: Self-pay | Admitting: Family Medicine

## 2017-02-25 DIAGNOSIS — J9859 Other diseases of mediastinum, not elsewhere classified: Secondary | ICD-10-CM | POA: Diagnosis not present

## 2017-02-25 DIAGNOSIS — Q341 Congenital cyst of mediastinum: Secondary | ICD-10-CM

## 2017-02-25 DIAGNOSIS — A048 Other specified bacterial intestinal infections: Secondary | ICD-10-CM

## 2017-02-25 DIAGNOSIS — R222 Localized swelling, mass and lump, trunk: Secondary | ICD-10-CM | POA: Diagnosis not present

## 2017-02-25 MED ORDER — OMEPRAZOLE 20 MG PO CPDR
20.0000 mg | DELAYED_RELEASE_CAPSULE | Freq: Two times a day (BID) | ORAL | 0 refills | Status: DC
Start: 2017-02-25 — End: 2018-03-16

## 2017-02-25 MED ORDER — CLARITHROMYCIN 500 MG PO TABS: 500.0000 mg | ORAL_TABLET | Freq: Two times a day (BID) | ORAL | 0 refills | Status: DC

## 2017-02-25 MED ORDER — IOPAMIDOL (ISOVUE-300) INJECTION 61%
75.0000 mL | Freq: Once | INTRAVENOUS | Status: AC | PRN
  Administered 2017-02-25: 75 mL via INTRAVENOUS

## 2017-02-25 MED ORDER — AMOXICILLIN 500 MG PO CAPS: 1000.0000 mg | ORAL_CAPSULE | Freq: Two times a day (BID) | ORAL | 0 refills | Status: DC

## 2017-04-08 ENCOUNTER — Ambulatory Visit
Admission: RE | Admit: 2017-04-08 | Discharge: 2017-04-08 | Disposition: A | Discharge status: Home / Self Care | Payer: 59 | Source: Ambulatory Visit | Attending: Obstetrics and Gynecology | Admitting: Obstetrics and Gynecology

## 2017-04-08 DIAGNOSIS — Z1231 Encounter for screening mammogram for malignant neoplasm of breast: Secondary | ICD-10-CM | POA: Diagnosis not present

## 2017-08-17 ENCOUNTER — Ambulatory Visit (INDEPENDENT_AMBULATORY_CARE_PROVIDER_SITE_OTHER): Payer: 59

## 2017-08-17 DIAGNOSIS — Z23 Encounter for immunization: Secondary | ICD-10-CM | POA: Diagnosis not present

## 2018-01-10 ENCOUNTER — Encounter: Payer: Self-pay | Admitting: Internal Medicine

## 2018-03-15 ENCOUNTER — Other Ambulatory Visit: Payer: Self-pay | Admitting: Obstetrics and Gynecology

## 2018-03-15 ENCOUNTER — Encounter: Payer: Self-pay | Admitting: Internal Medicine

## 2018-03-15 DIAGNOSIS — Z1231 Encounter for screening mammogram for malignant neoplasm of breast: Secondary | ICD-10-CM

## 2018-03-16 ENCOUNTER — Ambulatory Visit: Payer: 59 | Admitting: Family Medicine

## 2018-03-16 ENCOUNTER — Encounter: Payer: Self-pay | Admitting: Family Medicine

## 2018-03-16 VITALS — BP 148/84 | HR 75 | Temp 98.1°F | Ht 67.0 in | Wt 240.0 lb

## 2018-03-16 DIAGNOSIS — E538 Deficiency of other specified B group vitamins: Secondary | ICD-10-CM

## 2018-03-16 DIAGNOSIS — Z7689 Persons encountering health services in other specified circumstances: Secondary | ICD-10-CM | POA: Diagnosis not present

## 2018-03-16 DIAGNOSIS — Z6837 Body mass index (BMI) 37.0-37.9, adult: Secondary | ICD-10-CM

## 2018-03-16 DIAGNOSIS — R5383 Other fatigue: Secondary | ICD-10-CM | POA: Diagnosis not present

## 2018-03-16 DIAGNOSIS — L309 Dermatitis, unspecified: Secondary | ICD-10-CM

## 2018-03-16 DIAGNOSIS — R350 Frequency of micturition: Secondary | ICD-10-CM | POA: Diagnosis not present

## 2018-03-16 DIAGNOSIS — F411 Generalized anxiety disorder: Secondary | ICD-10-CM

## 2018-03-16 DIAGNOSIS — E663 Overweight: Secondary | ICD-10-CM | POA: Diagnosis not present

## 2018-03-16 LAB — CBC WITH DIFFERENTIAL/PLATELET
BASOS PCT: 0.3 % (ref 0.0–3.0)
Basophils Absolute: 0 10*3/uL (ref 0.0–0.1)
EOS ABS: 0.1 10*3/uL (ref 0.0–0.7)
EOS PCT: 1.9 % (ref 0.0–5.0)
HCT: 41.2 % (ref 36.0–46.0)
Hemoglobin: 13.9 g/dL (ref 12.0–15.0)
LYMPHS ABS: 2.7 10*3/uL (ref 0.7–4.0)
Lymphocytes Relative: 35.4 % (ref 12.0–46.0)
MCHC: 33.7 g/dL (ref 30.0–36.0)
MCV: 89.1 fl (ref 78.0–100.0)
MONO ABS: 0.6 10*3/uL (ref 0.1–1.0)
Monocytes Relative: 8 % (ref 3.0–12.0)
NEUTROS ABS: 4.2 10*3/uL (ref 1.4–7.7)
NEUTROS PCT: 54.4 % (ref 43.0–77.0)
PLATELETS: 159 10*3/uL (ref 150.0–400.0)
RBC: 4.62 Mil/uL (ref 3.87–5.11)
RDW: 13.1 % (ref 11.5–15.5)
WBC: 7.7 10*3/uL (ref 4.0–10.5)

## 2018-03-16 LAB — COMPREHENSIVE METABOLIC PANEL
ALBUMIN: 4.3 g/dL (ref 3.5–5.2)
ALT: 18 U/L (ref 0–35)
AST: 15 U/L (ref 0–37)
Alkaline Phosphatase: 96 U/L (ref 39–117)
BUN: 16 mg/dL (ref 6–23)
CHLORIDE: 103 meq/L (ref 96–112)
CO2: 31 meq/L (ref 19–32)
CREATININE: 0.73 mg/dL (ref 0.40–1.20)
Calcium: 9.6 mg/dL (ref 8.4–10.5)
GFR: 88.35 mL/min (ref >60.00)
GLUCOSE: 110 mg/dL — AB (ref 70–99)
POTASSIUM: 4.1 meq/L (ref 3.5–5.1)
SODIUM: 140 meq/L (ref 135–145)
Total Bilirubin: 0.4 mg/dL (ref 0.2–1.2)
Total Protein: 7.1 g/dL (ref 6.0–8.3)

## 2018-03-16 LAB — LIPID PANEL
Cholesterol: 187 mg/dL (ref 0–200)
HDL: 49.9 mg/dL (ref >39.00)
LDL CALC: 115 mg/dL — AB (ref 0–99)
NONHDL: 136.79
Total CHOL/HDL Ratio: 4
Triglycerides: 109 mg/dL (ref 0.0–149.0)
VLDL: 21.8 mg/dL (ref 0.0–40.0)

## 2018-03-16 LAB — VITAMIN B12: Vitamin B-12: 359 pg/mL (ref 211–911)

## 2018-03-16 LAB — HEMOGLOBIN A1C: HEMOGLOBIN A1C: 5.8 % (ref 4.6–6.5)

## 2018-03-16 MED ORDER — BETAMETHASONE DIPROPIONATE 0.05 % EX CREA: TOPICAL_CREAM | Freq: Two times a day (BID) | CUTANEOUS | 0 refills | Status: DC

## 2018-03-16 MED ORDER — LORAZEPAM 0.5 MG PO TABS: 0.5000 mg | ORAL_TABLET | Freq: Three times a day (TID) | ORAL | 0 refills | Status: DC | PRN

## 2018-03-16 NOTE — Progress Notes (Signed)
Subjective:    Patient ID: Virginia Oneal, female    DOB: Nov 16, 1965, 52 y.o.   MRN: [id number]  HPI This is a 52 yo female who presents today to establish care. Is transferring from Dr. Deborra Medina. She does not work outside the home, she is very involved with her church and volunteers. Married with 2 teenage daughters (15, 54).   Rash on right hand x several weeks- comes and goes, gets red, has applied some OTC cream (? Name), worse with hand washing. Has some small blisters. No new soaps, shampoos, lotion, travel.   Anxiety- uses lorazepam very rarely- couple of times a month. Does not feel like anxiety is a daily problem.  Frequently fatigued. Not sure if due to her busy schedule and inadequate sleep.   Last CPE- 2016 Mammo- 04/08/17, has upcoming appointment Pap- hx total hysterectomy Colonoscopy- 01/18/13- repeat in 5 years, has appointment for 7/19 Td- 09/26/08 Flu- annual Eye- 10/2017 Dental- regular Exercise- none Sleep- up frequently to urinate  Past Medical History:  Diagnosis Date  . Abnormal uterine bleeding   . Anxiety    postpartum and hx panic attacks  . Arrhythmia   . Chest mass    by CT scanning  . Depression   . Hx of migraines   . Kidney stones   . Personal history of colonic polyps   . Urinary incontinence 05/2009   hx bladder sling--Dr. Quincy Simmonds   Past Surgical History:  Procedure Laterality Date  . ABDOMINAL HYSTERECTOMY  05/2009   TAH/LSO  . BLADDER SUSPENSION  2010   Dr. Quincy Simmonds  . BREAST BIOPSY     ? left stereo  . CESAREAN SECTION  03/2003  . CESAREAN SECTION  2004  . COLONOSCOPY  09/08  . COLPORRHAPHY  05/2009   Dr. Quincy Simmonds  . ENDOMETRIAL ABLATION  2006   Dr. Quincy Simmonds  . INCONTINENCE SURGERY     sling for urinary incontinence  . LAPAROSCOPIC ENDOMETRIOSIS FULGURATION     ablation  . OOPHORECTOMY  2010   w/hyst--Dr. Quincy Simmonds  . TUBAL LIGATION  2004   w/ C-section Dr. Quincy Simmonds  . TYMPANOPLASTY     right ear  . VAGINAL HYSTERECTOMY  05/21/2009   lap  assisted /partial   Family History  Problem Relation Age of Onset  . Kidney disease Mother        renal failure  . Stroke Mother   . Diabetes Mother   . Cancer Mother        colon  . Colon cancer Mother   . Cancer - Colon Mother        deceased age 77  . Hypertension Mother   . Heart failure Mother   . Stroke Father   . Cancer Maternal Aunt        colon  . Colon cancer Maternal Aunt   . Hypertension Sister   . Rheum arthritis Sister   . Hypertension Brother   . Cancer - Colon Paternal Aunt        deceased  . Hypertension Sister   . Hypertension Sister   . Hypertension Sister   . Esophageal cancer Neg Hx   . Rectal cancer Neg Hx   . Stomach cancer Neg Hx    Social History   Tobacco Use  . Smoking status: Former Smoker    Types: Cigarettes    Last attempt to quit: 01/06/1999    Years since quitting: 19.2  . Smokeless tobacco: Never Used  Substance Use Topics  .  Alcohol use: No    Alcohol/week: 0.0 oz  . Drug use: No     Review of Systems Per HPI    Objective:   Physical Exam  Constitutional: She is oriented to person, place, and time. She appears well-developed and well-nourished.  HENT:  Head: Normocephalic and atraumatic.  Eyes: Conjunctivae are normal.  Cardiovascular: Normal rate.  Pulmonary/Chest: Effort normal.  Neurological: She is alert and oriented to person, place, and time.  Skin: Skin is warm and dry. Rash (erythematous macular/papular lesions scattered on right hand. ) noted.  Psychiatric: She has a normal mood and affect. Her behavior is normal. Judgment and thought content normal.  Vitals reviewed.     BP (!) 148/84 (BP Location: Right Arm, Patient Position: Sitting, Cuff Size: Large)   Pulse 75   Temp 98.1 F (36.7 C) (Oral)   Ht 5\' 7"  (1.702 m)   Wt 240 lb (108.9 kg)   SpO2 98%   BMI 37.59 kg/m  Wt Readings from Last 3 Encounters:  03/16/18 240 lb (108.9 kg)  02/24/17 253 lb (114.8 kg)  08/21/16 250 lb (113.4 kg)   BP  Readings from Last 3 Encounters:  03/16/18 (!) 148/84  02/24/17 (!) 142/82  08/21/16 138/84       Assessment & Plan:  1. Encounter to establish care - Discussed and encouraged healthy lifestyle choices- adequate sleep, regular exercise, stress management and healthy food choices.  - follow up in 1 year, suggested she alternate visits with me and gyn  2. Fatigue, unspecified type - CBC with Differential - Comprehensive metabolic panel  3. BMI 37.0-37.9, adult - Hemoglobin A1c - Lipid Panel  4. Urinary frequency - Hemoglobin A1c - POCT Urinalysis Dipstick (Automated)  5. Vitamin B12 deficiency - Vitamin B12  6. Eczema, unspecified type - betamethasone dipropionate (DIPROLENE) 0.05 % cream; Apply topically 2 (two) times daily. For no more than 10 days.  Dispense: 45 g; Refill: 0  7. Generalized anxiety disorder - discussed using lorazepam sparingly if increased need can consider daily medication. Discussed increasing exercise, eating healthy foods, getting adequate sleep, yoga/metitation.  - LORazepam (ATIVAN) 0.5 MG tablet; Take 1 tablet (0.5 mg total) by mouth every 8 (eight) hours as needed for anxiety.  Dispense: 30 tablet; Refill: 0   Clarene Reamer, FNP-BC   Primary Care at [facility], [facility]  03/20/2018 11:05 AM

## 2018-03-16 NOTE — Patient Instructions (Addendum)
Good to see you today  Try to walk every day- 15-20 minutes makes a huge difference    Mediterranean Diet A Mediterranean diet refers to food and lifestyle choices that are based on the traditions of countries located on the [employer]. This way of eating has been shown to help prevent certain conditions and improve outcomes for people who have chronic diseases, like kidney disease and heart disease. What are tips for following this plan? Lifestyle  Cook and eat meals together with your family, when possible.  Drink enough fluid to keep your urine clear or pale yellow.  Be physically active every day. This includes: ? Aerobic exercise like running or swimming. ? Leisure activities like gardening, walking, or housework.  Get 7-8 hours of sleep each night.  If recommended by your health care provider, drink red wine in moderation. This means 1 glass a day for nonpregnant women and 2 glasses a day for men. A glass of wine equals 5 oz (150 mL). Reading food labels  Check the serving size of packaged foods. For foods such as rice and pasta, the serving size refers to the amount of cooked product, not dry.  Check the total fat in packaged foods. Avoid foods that have saturated fat or trans fats.  Check the ingredients list for added sugars, such as corn syrup. Shopping  At the grocery store, buy most of your food from the areas near the walls of the store. This includes: ? Fresh fruits and vegetables (produce). ? Grains, beans, nuts, and seeds. Some of these may be available in unpackaged forms or large amounts (in bulk). ? Fresh seafood. ? Poultry and eggs. ? Low-fat dairy products.  Buy whole ingredients instead of prepackaged foods.  Buy fresh fruits and vegetables in-season from local farmers markets.  Buy frozen fruits and vegetables in resealable bags.  If you do not have access to quality fresh seafood, buy precooked frozen shrimp or canned fish, such as tuna,  salmon, or sardines.  Buy small amounts of raw or cooked vegetables, salads, or olives from the deli or salad bar at your store.  Stock your pantry so you always have certain foods on hand, such as olive oil, canned tuna, canned tomatoes, rice, pasta, and beans. Cooking  Cook foods with extra-virgin olive oil instead of using butter or other vegetable oils.  Have meat as a side dish, and have vegetables or grains as your main dish. This means having meat in small portions or adding small amounts of meat to foods like pasta or stew.  Use beans or vegetables instead of meat in common dishes like chili or lasagna.  Experiment with different cooking methods. Try roasting or broiling vegetables instead of steaming or sauteing them.  Add frozen vegetables to soups, stews, pasta, or rice.  Add nuts or seeds for added healthy fat at each meal. You can add these to yogurt, salads, or vegetable dishes.  Marinate fish or vegetables using olive oil, lemon juice, garlic, and fresh herbs. Meal planning  Plan to eat 1 vegetarian meal one day each week. Try to work up to 2 vegetarian meals, if possible.  Eat seafood 2 or more times a week.  Have healthy snacks readily available, such as: ? Vegetable sticks with hummus. ? [place] yogurt. ? Fruit and nut trail mix.  Eat balanced meals throughout the week. This includes: ? Fruit: 2-3 servings a day ? Vegetables: 4-5 servings a day ? Low-fat dairy: 2 servings a day ? Fish,  poultry, or lean meat: 1 serving a day ? Beans and legumes: 2 or more servings a week ? Nuts and seeds: 1-2 servings a day ? Whole grains: 6-8 servings a day ? Extra-virgin olive oil: 3-4 servings a day  Limit red meat and sweets to only a few servings a month What are my food choices?  Mediterranean diet ? Recommended ? Grains: Whole-grain pasta. Brown rice. Bulgar wheat. Polenta. Couscous. Whole-wheat bread. [place]. ? Vegetables: Artichokes. Beets. Broccoli.  Cabbage. Carrots. Eggplant. Green beans. Chard. Kale. Spinach. Onions. Leeks. Peas. Squash. Tomatoes. Peppers. Radishes. ? Fruits: Apples. Apricots. Avocado. Berries. Bananas. Cherries. Dates. Figs. Grapes. Lemons. Melon. Oranges. Peaches. Plums. Pomegranate. ? Meats and other protein foods: Beans. Almonds. Sunflower seeds. Pine nuts. Peanuts. [place]. Salmon. Scall[place]. Shrimp. [place]. Tilapia. Clams. Oysters. Eggs. ? Dairy: Low-fat milk. Cheese. Greek yogurt. ? Beverages: Water. Red wine. Herbal tea. ? Fats and oils: Extra virgin olive oil. Avocado oil. Grape seed oil. ? Sweets and desserts: [place] yogurt with honey. Baked apples. Poached pears. Trail mix. ? Seasoning and other foods: Basil. Cilantro. Coriander. Cumin. Mint. Parsley. Sage. Rosemary. Tarragon. Garlic. Oregano. Thyme. Pepper. Balsalmic vinegar. Tahini. Hummus. Tomato sauce. Olives. Mushrooms. ? Limit these ? Grains: Prepackaged pasta or rice dishes. Prepackaged cereal with added sugar. ? Vegetables: Deep fried potatoes (french fries). ? Fruits: Fruit canned in syrup. ? Meats and other protein foods: Beef. Pork. Lamb. Poultry with skin. Hot dogs. [place] Salines. ? Dairy: Ice cream. Sour cream. Whole milk. ? Beverages: Juice. Sugar-sweetened soft drinks. Beer. Liquor and spirits. ? Fats and oils: Butter. Canola oil. Vegetable oil. Beef fat (tallow). Lard. ? Sweets and desserts: Cookies. Cakes. Pies. Candy. ? Seasoning and other foods: Mayonnaise. Premade sauces and marinades. ? The items listed may not be a complete list. Talk with your dietitian about what dietary choices are right for you. Summary  The Mediterranean diet includes both food and lifestyle choices.  Eat a variety of fresh fruits and vegetables, beans, nuts, seeds, and whole grains.  Limit the amount of red meat and sweets that you eat.  Talk with your health care provider about whether it is safe for you to drink red wine in moderation. This means 1 glass a day for  nonpregnant women and 2 glasses a day for men. A glass of wine equals 5 oz (150 mL). This information is not intended to replace advice given to you by your health care provider. Make sure you discuss any questions you have with your health care provider. Document Released: 05/21/2016 Document Revised: 06/23/2016 Document Reviewed: 05/21/2016 Elsevier Interactive Patient Education  [employer].

## 2018-03-20 ENCOUNTER — Encounter: Payer: Self-pay | Admitting: Family Medicine

## 2018-04-13 ENCOUNTER — Ambulatory Visit
Admission: RE | Admit: 2018-04-13 | Discharge: 2018-04-13 | Disposition: A | Discharge status: Home / Self Care | Payer: 59 | Source: Ambulatory Visit | Attending: Obstetrics and Gynecology | Admitting: Obstetrics and Gynecology

## 2018-04-13 DIAGNOSIS — Z1231 Encounter for screening mammogram for malignant neoplasm of breast: Secondary | ICD-10-CM

## 2018-04-18 ENCOUNTER — Ambulatory Visit: Payer: 59 | Admitting: Family Medicine

## 2018-04-18 ENCOUNTER — Encounter: Payer: Self-pay | Admitting: Family Medicine

## 2018-04-18 VITALS — BP 150/74 | HR 80 | Temp 98.3°F | Ht 67.0 in | Wt 242.0 lb

## 2018-04-18 DIAGNOSIS — J029 Acute pharyngitis, unspecified: Secondary | ICD-10-CM | POA: Diagnosis not present

## 2018-04-18 DIAGNOSIS — J069 Acute upper respiratory infection, unspecified: Secondary | ICD-10-CM | POA: Diagnosis not present

## 2018-04-18 LAB — POCT RAPID STREP A (OFFICE): Rapid Strep A Screen: NEGATIVE

## 2018-04-18 NOTE — Progress Notes (Signed)
Dr. Frederico Hamman T. Cyniah Gossard, MD, [facility] [place], [address] Phone: [phone] Fax: [phone]  04/18/2018  Patient: Virginia Oneal, MRN: [id number], DOB: [birth year], 52 y.o.  Primary Physician:  Elby Beck, FNP   Chief Complaint  Patient presents with  . Sore Throat  . Ear Pain  . Shortness of Breath  . Nasal Congestion  . Cough   Subjective:   This 52 y.o. female patient presents with runny nose, sneezing, cough, sore throat, malaise and minimal / low-grade fever .  ST over the weekend, runny nose, cough, no fever. No muscle aches, joints, ears are also hurting.  ? recent exposure to others with similar symptoms.   The patent denies sore throat as the primary complaint. Denies sthortness of breath/wheezing, high fever, chest pain, rhinits for more than 14 days, significant myalgia, otalgia, facial pain, abdominal pain, changes in bowel or bladder.  PMH, PHS, Allergies, Problem List, Medications, Family History, and Social History have all been reviewed.  Patient Active Problem List   Diagnosis Date Noted  . Abdominal pain 02/24/2017  . Mediastinal cyst 02/24/2017  . Chest mass 10/08/2015  . Lipoma 08/13/2015  . Pedal edema 07/26/2014  . Chest pain 07/26/2014  . DOE (dyspnea on exertion) 07/26/2014  . Personal history of colonic polyps 01/18/2013  . Rotator cuff tendinitis 06/29/2011  . Anxiety 06/29/2011  . DIABETES MELLITUS, GESTATIONAL, HX OF 08/30/2008  . PREMATURE ATRIAL CONTRACTIONS 08/15/2008  . RENAL CALCULUS, HX OF 08/15/2008    Past Medical History:  Diagnosis Date  . Abnormal uterine bleeding   . Anxiety    postpartum and hx panic attacks  . Arrhythmia   . Chest mass    by CT scanning  . Depression   . Hx of migraines   . Kidney stones   . Personal history of colonic polyps   . Urinary incontinence 05/2009   hx bladder sling--Dr. Quincy Simmonds    Past Surgical History:  Procedure  Laterality Date  . ABDOMINAL HYSTERECTOMY  05/2009   TAH/LSO  . BLADDER SUSPENSION  2010   Dr. Quincy Simmonds  . BREAST BIOPSY     left stereo  . CESAREAN SECTION  03/2003  . CESAREAN SECTION  2004  . COLONOSCOPY  09/08  . COLPORRHAPHY  05/2009   Dr. Quincy Simmonds  . ENDOMETRIAL ABLATION  2006   Dr. Quincy Simmonds  . INCONTINENCE SURGERY     sling for urinary incontinence  . LAPAROSCOPIC ENDOMETRIOSIS FULGURATION     ablation  . OOPHORECTOMY  2010   w/hyst--Dr. Quincy Simmonds  . TUBAL LIGATION  2004   w/ C-section Dr. Quincy Simmonds  . TYMPANOPLASTY     right ear  . VAGINAL HYSTERECTOMY  05/21/2009   lap assisted /partial    Social History   Socioeconomic History  . Marital status: Married    Spouse name: Not on file  . Number of children: 2  . Years of education: Not on file  . Highest education level: Not on file  Occupational History  . Occupation: [employer]  Social Needs  . Financial resource strain: Not on file  . Food insecurity:    Worry: Not on file    Inability: Not on file  . Transportation needs:    Medical: Not on file    Non-medical: Not on file  Tobacco Use  . Smoking status: Former Smoker    Types: Cigarettes    Last attempt to quit: 01/06/1999  Years since quitting: 19.2  . Smokeless tobacco: Never Used  Substance and Sexual Activity  . Alcohol use: No    Alcohol/week: 0.0 oz  . Drug use: No  . Sexual activity: Yes    Partners: Male    Birth control/protection: Other-see comments, Surgical    Comment: TAH/LSO  Lifestyle  . Physical activity:    Days per week: Not on file    Minutes per session: Not on file  . Stress: Not on file  Relationships  . Social connections:    Talks on phone: Not on file    Gets together: Not on file    Attends religious service: Not on file    Active member of club or organization: Not on file    Attends meetings of clubs or organizations: Not on file    Relationship status: Not on file  . Intimate partner violence:    Fear of current or  ex partner: Not on file    Emotionally abused: Not on file    Physically abused: Not on file    Forced sexual activity: Not on file  Other Topics Concern  . Not on file  Social History Narrative   ** Merged History Encounter **        Family History  Problem Relation Age of Onset  . Kidney disease Mother        renal failure  . Stroke Mother   . Diabetes Mother   . Cancer Mother        colon  . Colon cancer Mother   . Cancer - Colon Mother        deceased age 32  . Hypertension Mother   . Heart failure Mother   . Stroke Father   . Cancer Maternal Aunt        colon  . Colon cancer Maternal Aunt   . Hypertension Sister   . Rheum arthritis Sister   . Hypertension Brother   . Cancer - Colon Paternal Aunt        deceased  . Hypertension Sister   . Hypertension Sister   . Hypertension Sister   . Esophageal cancer Neg Hx   . Rectal cancer Neg Hx   . Stomach cancer Neg Hx   . Breast cancer Neg Hx     Allergies  Allergen Reactions  . Buspar [Buspirone Hcl]     Worsened her panic attacks    Medication list reviewed and updated in full in [facility].  ROS as above, eating and drinking - tolerating PO. Urinating normally. No excessive vomitting or diarrhea. O/w as above.  Objective:   Blood pressure (!) 150/74, pulse 80, temperature 98.3 F (36.8 C), temperature source Oral, height 5\' 7"  (1.702 m), weight 242 lb (109.8 kg), SpO2 97 %.  GEN: WDWN, Non-toxic, Atraumatic, normocephalic. A and O x 3. HEENT: Oropharynx clear without exudate, MMM, no significant LAD, mild rhinnorhea Ears: TM clear, COL visualized with good landmarks CV: RRR, no m/g/r. Pulm: CTA B, no wheezes, rhonchi, or crackles, normal respiratory effort. EXT: no c/c/e Psych: well oriented, neither depressed nor anxious in appearance  Objective Data: Results for orders placed or performed in visit on 04/18/18  POCT rapid strep A  Result Value Ref Range   Rapid Strep A Screen Negative  Negative    Assessment and Plan:   URI, acute  Sore throat - Plan: POCT rapid strep A  Supportive care reviewed with patient. See patient instruction section.  Follow-up: No  follow-ups on file.  No orders of the defined types were placed in this encounter.  Orders Placed This Encounter  Procedures  . POCT rapid strep A    Signed,  Tenicia Gural T. Goddess Gebbia, MD   Patient's Medications  New Prescriptions   No medications on file  Previous Medications   BETAMETHASONE DIPROPIONATE (DIPROLENE) 0.05 % CREAM    Apply topically 2 (two) times daily. For no more than 10 days.   CETIRIZINE (ZYRTEC ALLERGY) 10 MG TABLET       CYANOCOBALAMIN (B-12) 100 MCG TABS       LORAZEPAM (ATIVAN) 0.5 MG TABLET    Take 1 tablet (0.5 mg total) by mouth every 8 (eight) hours as needed for anxiety.  Modified Medications   No medications on file  Discontinued Medications   No medications on file

## 2018-04-19 ENCOUNTER — Encounter: Payer: Self-pay | Admitting: Family Medicine

## 2018-05-20 ENCOUNTER — Ambulatory Visit (AMBULATORY_SURGERY_CENTER): Payer: Self-pay | Admitting: *Deleted

## 2018-05-20 VITALS — Ht 65.0 in | Wt 243.2 lb

## 2018-05-20 DIAGNOSIS — Z86010 Personal history of colonic polyps: Secondary | ICD-10-CM

## 2018-05-20 DIAGNOSIS — Z800 Family history of malignant neoplasm of digestive organs: Secondary | ICD-10-CM

## 2018-05-20 NOTE — Progress Notes (Signed)
Denies allergies to eggs or soy products. Denies complications with sedation or anesthesia. Denies O2 use. Denies use of diet or weight loss medications.  Emmi instructions given for colonoscopy.  

## 2018-05-23 ENCOUNTER — Encounter: Payer: Self-pay | Admitting: Internal Medicine

## 2018-06-03 ENCOUNTER — Ambulatory Visit (AMBULATORY_SURGERY_CENTER): Payer: 59 | Admitting: Internal Medicine

## 2018-06-03 ENCOUNTER — Encounter: Payer: Self-pay | Admitting: Internal Medicine

## 2018-06-03 VITALS — BP 157/79 | HR 80 | Temp 97.8°F | Resp 14 | Ht 67.0 in | Wt 242.0 lb

## 2018-06-03 DIAGNOSIS — Z800 Family history of malignant neoplasm of digestive organs: Secondary | ICD-10-CM | POA: Diagnosis not present

## 2018-06-03 DIAGNOSIS — Z1211 Encounter for screening for malignant neoplasm of colon: Secondary | ICD-10-CM

## 2018-06-03 MED ORDER — SODIUM CHLORIDE 0.9 % IV SOLN: 500.0000 mL | Freq: Once | INTRAVENOUS | Status: DC

## 2018-06-03 NOTE — Patient Instructions (Signed)
Impression/Recommendations:  Resume previous diet. Continue present medications.  Repeat colonoscopy in 5 years for screening.  YOU HAD AN ENDOSCOPIC PROCEDURE TODAY AT [facility]:   Refer to the procedure report that was given to you for any specific questions about what was found during the examination.  If the procedure report does not answer your questions, please call your gastroenterologist to clarify.  If you requested that your care partner not be given the details of your procedure findings, then the procedure report has been included in a sealed envelope for you to review at your convenience later.  YOU SHOULD EXPECT: Some feelings of bloating in the abdomen. Passage of more gas than usual.  Walking can help get rid of the air that was put into your GI tract during the procedure and reduce the bloating. If you had a lower endoscopy (such as a colonoscopy or flexible sigmoidoscopy) you may notice spotting of blood in your stool or on the toilet paper. If you underwent a bowel prep for your procedure, you may not have a normal bowel movement for a few days.  Please Note:  You might notice some irritation and congestion in your nose or some drainage.  This is from the oxygen used during your procedure.  There is no need for concern and it should clear up in a day or so.  SYMPTOMS TO REPORT IMMEDIATELY:   Following lower endoscopy (colonoscopy or flexible sigmoidoscopy):  Excessive amounts of blood in the stool  Significant tenderness or worsening of abdominal pains  Swelling of the abdomen that is new, acute  Fever of 100F or higher For urgent or emergent issues, a gastroenterologist can be reached at any hour by calling [phone].   DIET:  We do recommend a small meal at first, but then you may proceed to your regular diet.  Drink plenty of fluids but you should avoid alcoholic beverages for 24 hours.  ACTIVITY:  You should plan to take it easy for the rest  of today and you should NOT DRIVE or use heavy machinery until tomorrow (because of the sedation medicines used during the test).    FOLLOW UP: Our staff will call the number listed on your records the next business day following your procedure to check on you and address any questions or concerns that you may have regarding the information given to you following your procedure. If we do not reach you, we will leave a message.  However, if you are feeling well and you are not experiencing any problems, there is no need to return our call.  We will assume that you have returned to your regular daily activities without incident.  If any biopsies were taken you will be contacted by phone or by letter within the next 1-3 weeks.  Please call us at [phone] if you have not heard about the biopsies in 3 weeks.    SIGNATURES/CONFIDENTIALITY: You and/or your care partner have signed paperwork which will be entered into your electronic medical record.  These signatures attest to the fact that that the information above on your After Visit Summary has been reviewed and is understood.  Full responsibility of the confidentiality of this discharge information lies with you and/or your care-partner.

## 2018-06-03 NOTE — Progress Notes (Signed)
A/ox3 pleased with MAC, report to RN 

## 2018-06-03 NOTE — Op Note (Signed)
[place] Patient Name: Virginia Oneal Procedure Date: 06/03/2018 10:02 AM MRN: [id number] Endoscopist: Gatha Mayer , MD Age: 52 Referring MD:  Date of Birth: [birth year] Gender: Female Account #: [id number] Procedure:                Colonoscopy Indications:              Screening in patient at increased risk: Colorectal                            cancer in mother 27 or older, Colon cancer                            screening in patient at increased risk: Family                            history of colorectal cancer in multiple 2nd degree                            relatives Medicines:                Propofol per Anesthesia, Monitored Anesthesia Care Procedure:                Pre-Anesthesia Assessment:                           - Prior to the procedure, a History and Physical                            was performed, and patient medications and                            allergies were reviewed. The patient's tolerance of                            previous anesthesia was also reviewed. The risks                            and benefits of the procedure and the sedation                            options and risks were discussed with the patient.                            All questions were answered, and informed consent                            was obtained. Prior Anticoagulants: The patient has                            taken no previous anticoagulant or antiplatelet                            agents. ASA Grade Assessment: III - A patient with  severe systemic disease. After reviewing the risks                            and benefits, the patient was deemed in                            satisfactory condition to undergo the procedure.                           After obtaining informed consent, the colonoscope                            was passed under direct vision. Throughout the                            procedure, the patient's blood  pressure, pulse, and                            oxygen saturations were monitored continuously. The                            Model CF-HQ190L [id number]) scope was introduced                            through the anus and advanced to the the cecum,                            identified by appendiceal orifice and ileocecal                            valve. The colonoscopy was performed with                            difficulty due to a redundant colon, significant                            looping and the patient's body habitus. Successful                            completion of the procedure was aided by applying                            abdominal pressure. The patient tolerated the                            procedure well. The quality of the bowel                            preparation was excellent. The bowel preparation                            used was Miralax. The ileocecal valve, appendiceal  orifice, and rectum were photographed. Scope In: 10:08:53 AM Scope Out: 10:26:38 AM Scope Withdrawal Time: 0 hours 8 minutes 49 seconds  Total Procedure Duration: 0 hours 17 minutes 45 seconds  Findings:                 The perianal and digital rectal examinations were                            normal.                           The colon (entire examined portion) was                            significantly redundant. Advancing the scope                            required using manual pressure.                           The exam was otherwise without abnormality on                            direct and retroflexion views. Complications:            No immediate complications. Estimated Blood Loss:     Estimated blood loss: none. Impression:               - Redundant colon.                           - The examination was otherwise normal on direct                            and retroflexion views.                           - No specimens  collected. Recommendation:           - Patient has a contact number available for                            emergencies. The signs and symptoms of potential                            delayed complications were discussed with the                            patient. Return to normal activities tomorrow.                            Written discharge instructions were provided to the                            patient.                           - Resume previous diet.                           -  Continue present medications.                           - Repeat colonoscopy in 5 years for screening                            purposes. Gatha Mayer, MD 06/03/2018 10:35:04 AM This report has been signed electronically.

## 2018-06-03 NOTE — Progress Notes (Signed)
Pt's states no medical or surgical changes since previsit or office visit. 

## 2018-06-06 ENCOUNTER — Telehealth: Payer: Self-pay

## 2018-06-06 NOTE — Telephone Encounter (Signed)
  Follow up Call-  Call back number 06/03/2018  Post procedure Call Back phone  # [phone]  Permission to leave phone message Yes  Some recent data might be hidden     Patient questions:  Do you have a fever, pain , or abdominal swelling? No. Pain Score  0 *  Have you tolerated food without any problems? Yes.    Have you been able to return to your normal activities? Yes.    Do you have any questions about your discharge instructions: Diet   No. Medications  No. Follow up visit  No.  Do you have questions or concerns about your Care? No.  Actions: * If pain score is 4 or above: No action needed, pain <4.

## 2018-07-21 ENCOUNTER — Ambulatory Visit (INDEPENDENT_AMBULATORY_CARE_PROVIDER_SITE_OTHER): Payer: 59

## 2018-07-21 DIAGNOSIS — Z23 Encounter for immunization: Secondary | ICD-10-CM

## 2018-07-28 DIAGNOSIS — X32XXXA Exposure to sunlight, initial encounter: Secondary | ICD-10-CM | POA: Diagnosis not present

## 2018-07-28 DIAGNOSIS — R238 Other skin changes: Secondary | ICD-10-CM | POA: Diagnosis not present

## 2018-07-28 DIAGNOSIS — R208 Other disturbances of skin sensation: Secondary | ICD-10-CM | POA: Diagnosis not present

## 2018-07-28 DIAGNOSIS — D485 Neoplasm of uncertain behavior of skin: Secondary | ICD-10-CM | POA: Diagnosis not present

## 2018-07-28 DIAGNOSIS — D2262 Melanocytic nevi of left upper limb, including shoulder: Secondary | ICD-10-CM | POA: Diagnosis not present

## 2018-07-28 DIAGNOSIS — L570 Actinic keratosis: Secondary | ICD-10-CM | POA: Diagnosis not present

## 2018-07-28 DIAGNOSIS — D225 Melanocytic nevi of trunk: Secondary | ICD-10-CM | POA: Diagnosis not present

## 2018-07-28 DIAGNOSIS — B078 Other viral warts: Secondary | ICD-10-CM | POA: Diagnosis not present

## 2018-07-28 DIAGNOSIS — D2261 Melanocytic nevi of right upper limb, including shoulder: Secondary | ICD-10-CM | POA: Diagnosis not present

## 2018-07-28 DIAGNOSIS — D0439 Carcinoma in situ of skin of other parts of face: Secondary | ICD-10-CM | POA: Diagnosis not present

## 2018-08-31 ENCOUNTER — Ambulatory Visit: Payer: 59 | Admitting: Family Medicine

## 2018-08-31 ENCOUNTER — Encounter: Payer: Self-pay | Admitting: Family Medicine

## 2018-08-31 VITALS — BP 146/92 | HR 80 | Temp 98.0°F | Wt 247.0 lb

## 2018-08-31 DIAGNOSIS — R590 Localized enlarged lymph nodes: Secondary | ICD-10-CM

## 2018-08-31 DIAGNOSIS — H00015 Hordeolum externum left lower eyelid: Secondary | ICD-10-CM

## 2018-08-31 NOTE — Progress Notes (Signed)
Subjective:    Patient ID: Virginia Oneal, female    DOB: 05-09-1966, 52 y.o.   MRN: [id number]  HPI This is a 52 yo female who presents today with left lower eyelid swelling x 3 days, has tried warm compresses. Yesterday noticed knot in front of left ear. No upper respiratory symptoms. No fever, no unusual ear pain, no mouth or tooth pain.  Has been using topical chemo therapy on lower cheek for skin cancer. Stopped several days ago, but had irritated, red area during use.   Past Medical History:  Diagnosis Date  . Abnormal uterine bleeding   . Anxiety    postpartum and hx panic attacks  . Arrhythmia   . Chest mass    by CT scanning  . Depression   . Gestational diabetes   . Hx of migraines   . Kidney stones   . Personal history of colonic polyps   . Seasonal allergies   . Urinary incontinence 05/2009   hx bladder sling--Dr. Quincy Simmonds   Past Surgical History:  Procedure Laterality Date  . ABDOMINAL HYSTERECTOMY  05/2009   TAH/LSO  . BLADDER SUSPENSION  2010   Dr. Quincy Simmonds  . BREAST BIOPSY     left stereo  . CESAREAN SECTION  03/2003  . CESAREAN SECTION  2004  . COLONOSCOPY  09/08  . COLPORRHAPHY  05/2009   Dr. Quincy Simmonds  . ENDOMETRIAL ABLATION  2006   Dr. Quincy Simmonds  . INCONTINENCE SURGERY     sling for urinary incontinence  . LAPAROSCOPIC ENDOMETRIOSIS FULGURATION     ablation  . OOPHORECTOMY  2010   w/hyst--Dr. Quincy Simmonds  . TUBAL LIGATION  2004   w/ C-section Dr. Quincy Simmonds  . TYMPANOPLASTY     right ear  . VAGINAL HYSTERECTOMY  05/21/2009   lap assisted /partial   Family History  Problem Relation Age of Onset  . Kidney disease Mother        renal failure  . Stroke Mother   . Diabetes Mother   . Cancer - Colon Mother        deceased age 1  . Hypertension Mother   . Heart failure Mother   . Stroke Father   . Cancer Maternal Aunt        colon  . Colon cancer Maternal Aunt   . Hypertension Sister   . Rheum arthritis Sister   . Hypertension Brother   . Cancer - Colon  Paternal Aunt        deceased  . Hypertension Sister   . Hypertension Sister   . Hypertension Sister   . Esophageal cancer Neg Hx   . Rectal cancer Neg Hx   . Stomach cancer Neg Hx   . Breast cancer Neg Hx    Social History   Tobacco Use  . Smoking status: Former Smoker    Types: Cigarettes    Last attempt to quit: 01/06/1999    Years since quitting: 19.6  . Smokeless tobacco: Never Used  Substance Use Topics  . Alcohol use: No    Alcohol/week: 0.0 standard drinks  . Drug use: No      Review of Systems Per HPI    Objective:   Physical Exam  Constitutional: She is oriented to person, place, and time. She appears well-developed and well-nourished. No distress.  HENT:  Head: Normocephalic and atraumatic.  Right Ear: Tympanic membrane, external ear and ear canal normal.  Left Ear: Tympanic membrane, external ear and ear canal normal.  Nose:  Nose normal.  Mouth/Throat: Oropharynx is clear and moist.  Eyes: Conjunctivae and EOM are normal. Left eye exhibits hordeolum.    Cardiovascular: Normal rate.  Pulmonary/Chest: Effort normal.  Lymphadenopathy:       Head (right side): No submental, no submandibular, no tonsillar, no preauricular, no posterior auricular and no occipital adenopathy present.       Head (left side): Preauricular adenopathy present. No submental, no submandibular, no tonsillar, no posterior auricular and no occipital adenopathy present.    She has no cervical adenopathy.  1 cm  Neurological: She is alert and oriented to person, place, and time.  Skin: Skin is warm and dry. She is not diaphoretic.  Psychiatric: She has a normal mood and affect. Her behavior is normal. Judgment and thought content normal.  Vitals reviewed.     BP (!) 146/92   Pulse 80   Temp 98 F (36.7 C) (Oral)   Wt 247 lb (112 kg)   SpO2 98%   BMI 38.69 kg/m  Wt Readings from Last 3 Encounters:  08/31/18 247 lb (112 kg)  06/03/18 242 lb (109.8 kg)  05/20/18 243 lb 3.2 oz  (110.3 kg)       Assessment & Plan:  1. Hordeolum externum of left lower eyelid - Provided written and verbal information regarding diagnosis and treatment. - continue warm compresses 4-5 times a day, she is to notify me if not better in 24 hours and will refer to opthalomology  2. Preauricular adenopathy - not sure if related to stye or recent topical chemo on cheek. Discussed possible causes, will monitor for fever/chills, any additional symptoms, if not improved in a couple of weeks, will check labs   Clarene Reamer, FNP-BC  [facility], [place]  08/31/2018 9:23 PM

## 2018-08-31 NOTE — Patient Instructions (Signed)
Good to see you today  If not better in 24 hours, please call and let me know  Take 3 ibuprofen every 8 hours as needed for pain, swelling    Stye A stye is a bump on your eyelid caused by a bacterial infection. A stye can form inside the eyelid (internal stye) or outside the eyelid (external stye). An internal stye may be caused by an infected oil-producing gland inside your eyelid. An external stye may be caused by an infection at the base of your eyelash (hair follicle). Styes are very common. Anyone can get them at any age. They usually occur in just one eye, but you may have more than one in either eye. What are the causes? The infection is almost always caused by bacteria called Staphylococcus aureus. This is a common type of bacteria that lives on your skin. What increases the risk? You may be at higher risk for a stye if you have had one before. You may also be at higher risk if you have:  Diabetes.  Long-term illness.  Long-term eye redness.  A skin condition called seborrhea.  High fat levels in your blood (lipids).  What are the signs or symptoms? Eyelid pain is the most common symptom of a stye. Internal styes are more painful than external styes. Other signs and symptoms may include:  Painful swelling of your eyelid.  A scratchy feeling in your eye.  Tearing and redness of your eye.  Pus draining from the stye.  How is this diagnosed? Your health care provider may be able to diagnose a stye just by examining your eye. The health care provider may also check to make sure:  You do not have a fever or other signs of a more serious infection.  The infection has not spread to other parts of your eye or areas around your eye.  How is this treated? Most styes will clear up in a few days without treatment. In some cases, you may need to use antibiotic drops or ointment to prevent infection. Your health care provider may have to drain the stye surgically if your stye  is:  Large.  Causing a lot of pain.  Interfering with your vision.  This can be done using a thin blade or a needle. Follow these instructions at home:  Take medicines only as directed by your health care provider.  Apply a clean, warm compress to your eye for 10 minutes, 4 times a day.  Do not wear contact lenses or eye makeup until your stye has healed.  Do not try to pop or drain the stye. Contact a health care provider if:  You have chills or a fever.  Your stye does not go away after several days.  Your stye affects your vision.  Your eyeball becomes swollen, red, or painful. This information is not intended to replace advice given to you by your health care provider. Make sure you discuss any questions you have with your health care provider. Document Released: 07/08/2005 Document Revised: 05/24/2016 Document Reviewed: 01/12/2014 Elsevier Interactive Patient Education  [employer].

## 2018-09-05 ENCOUNTER — Ambulatory Visit: Payer: 59 | Admitting: Family Medicine

## 2018-09-05 ENCOUNTER — Encounter: Payer: Self-pay | Admitting: Family Medicine

## 2018-09-05 VITALS — BP 160/90 | HR 95 | Temp 97.6°F | Ht 67.0 in | Wt 249.0 lb

## 2018-09-05 DIAGNOSIS — B0239 Other herpes zoster eye disease: Secondary | ICD-10-CM | POA: Diagnosis not present

## 2018-09-05 DIAGNOSIS — B0230 Zoster ocular disease, unspecified: Secondary | ICD-10-CM

## 2018-09-05 MED ORDER — VALACYCLOVIR HCL 1 G PO TABS: 1000.0000 mg | ORAL_TABLET | Freq: Three times a day (TID) | ORAL | 0 refills | Status: AC

## 2018-09-05 NOTE — Progress Notes (Signed)
Subjective:    Patient ID: Virginia Oneal, female    DOB: 01-May-1966, 52 y.o.   MRN: [id number]  HPI This is a 52 yo female who presents today with continued problems with left eye. Was seen 08/31/18 with hordeolum externum of left lower lid and swollen preauricular lymph node. She was instructed on treatment and though it was getting better over next 2 days. Approximately 3 days ago, began noticing burning, numbness and itching of eye and left cheek. Has noticed several bumps. Does not think they were fluid filled. She is not sure if she every had chickenpox as a child. Has not had shingle vaccine.   Past Medical History:  Diagnosis Date  . Abnormal uterine bleeding   . Anxiety    postpartum and hx panic attacks  . Arrhythmia   . Chest mass    by CT scanning  . Depression   . Gestational diabetes   . Hx of migraines   . Kidney stones   . Personal history of colonic polyps   . Seasonal allergies   . Urinary incontinence 05/2009   hx bladder sling--Dr. Quincy Simmonds   Past Surgical History:  Procedure Laterality Date  . ABDOMINAL HYSTERECTOMY  05/2009   TAH/LSO  . BLADDER SUSPENSION  2010   Dr. Quincy Simmonds  . BREAST BIOPSY     left stereo  . CESAREAN SECTION  03/2003  . CESAREAN SECTION  2004  . COLONOSCOPY  09/08  . COLPORRHAPHY  05/2009   Dr. Quincy Simmonds  . ENDOMETRIAL ABLATION  2006   Dr. Quincy Simmonds  . INCONTINENCE SURGERY     sling for urinary incontinence  . LAPAROSCOPIC ENDOMETRIOSIS FULGURATION     ablation  . OOPHORECTOMY  2010   w/hyst--Dr. Quincy Simmonds  . TUBAL LIGATION  2004   w/ C-section Dr. Quincy Simmonds  . TYMPANOPLASTY     right ear  . VAGINAL HYSTERECTOMY  05/21/2009   lap assisted /partial   Family History  Problem Relation Age of Onset  . Kidney disease Mother        renal failure  . Stroke Mother   . Diabetes Mother   . Cancer - Colon Mother        deceased age 105  . Hypertension Mother   . Heart failure Mother   . Stroke Father   . Cancer Maternal Aunt        colon  . Colon  cancer Maternal Aunt   . Hypertension Sister   . Rheum arthritis Sister   . Hypertension Brother   . Cancer - Colon Paternal Aunt        deceased  . Hypertension Sister   . Hypertension Sister   . Hypertension Sister   . Esophageal cancer Neg Hx   . Rectal cancer Neg Hx   . Stomach cancer Neg Hx   . Breast cancer Neg Hx    Social History   Tobacco Use  . Smoking status: Former Smoker    Types: Cigarettes    Last attempt to quit: 01/06/1999    Years since quitting: 19.6  . Smokeless tobacco: Never Used  Substance Use Topics  . Alcohol use: No    Alcohol/week: 0.0 standard drinks  . Drug use: No      Review of Systems Per HPI    Objective:   Physical Exam  Constitutional: She is oriented to person, place, and time. She appears well-developed and well-nourished.  HENT:  Head: Normocephalic and atraumatic.  Eyes: EOM are normal. Left  conjunctiva is injected (mildly).  Left lower eyelid with several new (from last week), lesions. Appear excoriated. No drainage. Hordeolum improved.   Cardiovascular: Normal rate.  Pulmonary/Chest: Effort normal.  Neurological: She is alert and oriented to person, place, and time.  Skin: Skin is warm and dry.  Psychiatric: She has a normal mood and affect. Her behavior is normal. Judgment and thought content normal.  Vitals reviewed.     BP (!) 160/90 (BP Location: Left Arm, Patient Position: Sitting, Cuff Size: Large)   Pulse 95   Temp 97.6 F (36.4 C) (Oral)   Ht 5\' 7"  (1.702 m)   Wt 249 lb (112.9 kg)   SpO2 98%   BMI 39.00 kg/m  Wt Readings from Last 3 Encounters:  09/05/18 249 lb (112.9 kg)  08/31/18 247 lb (112 kg)  06/03/18 242 lb (109.8 kg)       Assessment & Plan:  1. Herpes zoster with ophthalmic complication, unspecified herpes zoster eye disease - high suspicion for herpetic lesions around eye, have provided her a printed prescription for anti-viral and will get her in to ophthalmology today for confirmation and  exam - Ambulatory referral to Ophthalmology - valACYclovir (VALTREX) 1000 MG tablet; Take 1 tablet (1,000 mg total) by mouth 3 (three) times daily for 7 days.  Dispense: 21 tablet; Refill: 0   Clarene Reamer, FNP-BC  [facility]  09/05/2018 10:29 AM

## 2018-09-05 NOTE — Patient Instructions (Signed)
I suspect you have shingles on your face. Please stop at the front desk to see the referrals coordinator for an appointment today  I am providing you with a prescription for an anti viral medication if the eye doctor thinks you have shingles- please start right away     Shingles Shingles, which is also known as herpes zoster, is an infection that causes a painful skin rash and fluid-filled blisters. Shingles is not related to genital herpes, which is a sexually transmitted infection. Shingles only develops in people who:  Have had chickenpox.  Have received the chickenpox vaccine. (This is rare.)  What are the causes? Shingles is caused by varicella-zoster virus (VZV). This is the same virus that causes chickenpox. After exposure to VZV, the virus stays in the body in an inactive (dormant) state. Shingles develops if the virus reactivates. This can happen many years after the initial exposure to VZV. It is not known what causes this virus to reactivate. What increases the risk? People who have had chickenpox or received the chickenpox vaccine are at risk for shingles. Infection is more common in people who:  Are older than age 75.  Have a weakened defense (immune) system, such as those with HIV, AIDS, or cancer.  Are taking medicines that weaken the immune system, such as transplant medicines.  Are under great stress.  What are the signs or symptoms? Early symptoms of this condition include itching, tingling, and pain in an area on your skin. Pain may be described as burning, stabbing, or throbbing. A few days or weeks after symptoms start, a painful red rash appears, usually on one side of the body in a bandlike or beltlike pattern. The rash eventually turns into fluid-filled blisters that break open, scab over, and dry up in about 2-3 weeks. At any time during the infection, you may also develop:  A fever.  Chills.  A headache.  An upset stomach.  How is this  diagnosed? This condition is diagnosed with a skin exam. Sometimes, skin or fluid samples are taken from the blisters before a diagnosis is made. These samples are examined under a microscope or sent to a lab for testing. How is this treated? There is no specific cure for this condition. Your health care provider will probably prescribe medicines to help you manage pain, recover more quickly, and avoid long-term problems. Medicines may include:  Antiviral drugs.  Anti-inflammatory drugs.  Pain medicines.  If the area involved is on your face, you may be referred to a specialist, such as an eye doctor (ophthalmologist) or an ear, nose, and throat (ENT) doctor to help you avoid eye problems, chronic pain, or disability. Follow these instructions at home: Medicines  Take medicines only as directed by your health care provider.  Apply an anti-itch or numbing cream to the affected area as directed by your health care provider. Blister and Rash Care  Take a cool bath or apply cool compresses to the area of the rash or blisters as directed by your health care provider. This may help with pain and itching.  Keep your rash covered with a loose bandage (dressing). Wear loose-fitting clothing to help ease the pain of material rubbing against the rash.  Keep your rash and blisters clean with mild soap and cool water or as directed by your health care provider.  Check your rash every day for signs of infection. These include redness, swelling, and pain that lasts or increases.  Do not pick your blisters.  Do not scratch your rash. General instructions  Rest as directed by your health care provider.  Keep all follow-up visits as directed by your health care provider. This is important.  Until your blisters scab over, your infection can cause chickenpox in people who have never had it or been vaccinated against it. To prevent this from happening, avoid contact with other people,  especially: ? Babies. ? Pregnant women. ? Children who have eczema. ? Elderly people who have transplants. ? People who have chronic illnesses, such as leukemia or AIDS. Contact a health care provider if:  Your pain is not relieved with prescribed medicines.  Your pain does not get better after the rash heals.  Your rash looks infected. Signs of infection include redness, swelling, and pain that lasts or increases. Get help right away if:  The rash is on your face or nose.  You have facial pain, pain around your eye area, or loss of feeling on one side of your face.  You have ear pain or you have ringing in your ear.  You have loss of taste.  Your condition gets worse. This information is not intended to replace advice given to you by your health care provider. Make sure you discuss any questions you have with your health care provider. Document Released: 09/28/2005 Document Revised: 05/24/2016 Document Reviewed: 08/09/2014 Elsevier Interactive Patient Education  2018 [employer].

## 2018-11-09 ENCOUNTER — Encounter: Payer: Self-pay | Admitting: Family Medicine

## 2018-11-09 ENCOUNTER — Ambulatory Visit (INDEPENDENT_AMBULATORY_CARE_PROVIDER_SITE_OTHER): Payer: 59 | Admitting: Family Medicine

## 2018-11-09 VITALS — BP 154/92 | HR 86 | Temp 97.7°F | Ht 67.0 in | Wt 249.5 lb

## 2018-11-09 DIAGNOSIS — S39012A Strain of muscle, fascia and tendon of lower back, initial encounter: Secondary | ICD-10-CM | POA: Diagnosis not present

## 2018-11-09 DIAGNOSIS — I10 Essential (primary) hypertension: Secondary | ICD-10-CM | POA: Diagnosis not present

## 2018-11-09 NOTE — Progress Notes (Signed)
Subjective:     Virginia Oneal is a 53 y.o. female presenting for Back Pain (Right side lower back and radiating down to right leg. Started about 3 weeks ago. has applied patches for back pain OTC. Gets worse mid day to evening time. Difficult with walking and bending over due to the pain. No trauma/injury to the affected area.)     Back Pain  This is a new problem. The current episode started 1 to 4 weeks ago. The problem occurs daily. The problem has been gradually worsening since onset. The pain is present in the lumbar spine. The quality of the pain is described as stabbing. The pain radiates to the right thigh. The pain is at a severity of 9/10. The pain is worse during the night. The symptoms are aggravated by bending, standing and coughing. Associated symptoms include leg pain, numbness and tingling. Pertinent negatives include no abdominal pain, bladder incontinence, bowel incontinence, chest pain, dysuria, fever, headaches, weakness or weight loss. Risk factors include lack of exercise. She has tried NSAIDs and heat (pain patches - capsaicin) for the symptoms. The treatment provided mild relief.    #HTN - has been told it has been high in the past - but has never been medication - has not check her bp at home  Review of Systems  Constitutional: Negative for fever and weight loss.  Eyes: Negative for visual disturbance.  Respiratory: Negative for shortness of breath.   Cardiovascular: Negative for chest pain, palpitations and leg swelling.  Gastrointestinal: Negative for abdominal pain and bowel incontinence.  Genitourinary: Negative for bladder incontinence and dysuria.  Musculoskeletal: Positive for back pain.  Neurological: Positive for tingling and numbness. Negative for weakness and headaches.   Family hx of HTN  Social History   Tobacco Use  Smoking Status Former Smoker  . Types: Cigarettes  . Last attempt to quit: 01/06/1999  . Years since quitting: 19.8  Smokeless  Tobacco Never Used        Objective:    BP Readings from Last 3 Encounters:  11/09/18 (!) 154/92  09/05/18 (!) 160/90  08/31/18 (!) 146/92     Wt Readings from Last 3 Encounters:  11/09/18 249 lb 8 oz (113.2 kg)  09/05/18 249 lb (112.9 kg)  08/31/18 247 lb (112 kg)    BP (!) 154/92   Pulse 86   Temp 97.7 F (36.5 C)   Ht 5\' 7"  (1.702 m)   Wt 249 lb 8 oz (113.2 kg)   SpO2 98%   BMI 39.08 kg/m    Physical Exam Constitutional:      General: She is not in acute distress.    Appearance: She is well-developed. She is not diaphoretic.  HENT:     Right Ear: External ear normal.     Left Ear: External ear normal.     Nose: Nose normal.  Eyes:     Conjunctiva/sclera: Conjunctivae normal.  Neck:     Musculoskeletal: Neck supple.  Cardiovascular:     Rate and Rhythm: Normal rate.  Pulmonary:     Effort: Pulmonary effort is normal.  Musculoskeletal:     Comments: Back:  Inspection: no acute abnormalities Palpation: No spinous process TTP, Right lumbar paraspinous TTP ROM: pain with forward flexion and left rotation/flexion, otherwise normal ROM Strength: normal LE strength Straight leg raise with some radiating symptoms to the knee.   Skin:    General: Skin is warm and dry.     Capillary Refill: Capillary refill  takes less than 2 seconds.  Neurological:     Mental Status: She is alert. Mental status is at baseline.     Sensory: Sensation is intact.     Motor: Motor function is intact.     Deep Tendon Reflexes:     Reflex Scores:      Patellar reflexes are 2+ on the right side and 2+ on the left side.      Achilles reflexes are 2+ on the right side and 2+ on the left side. Psychiatric:        Mood and Affect: Mood normal.        Behavior: Behavior normal.           Assessment & Plan:   Problem List Items Addressed This Visit      Cardiovascular and Mediastinum   Essential hypertension    BP appears to be intermittently elevated as far back as 2018.  Discussed importance of treating high blood pressure to prevent Heart attack and stroke. She is hesitant to start medication so discussed 3 month trial of diet/exercise which she plans to do. Labs in June fairly normal so did not feel the need to repeat labs today.         Musculoskeletal and Integument   Strain of lumbar region - Primary    Some signs of disc pathology with radiating symptoms. Discussed that symptoms generally resolved within 6 weeks. Offered PT, but patient preferred home exercises which were provided. Recommend follow-up in 3 weeks if not improving to considering imagine. No weakness or atrophy, normal sensation          Return in about 3 weeks (around 11/30/2018) for back pain if not improved, 3 months for blood pressure.  Lesleigh Noe, MD

## 2018-11-09 NOTE — Patient Instructions (Addendum)
Elevated blood pressure 1) Monitor your blood pressure at home. Normal is <140/90 (ideally around 120/80) 2) Start an exercise routine and try to get 150 minutes of moderate exercise per week (walking uphill, swimming, jogging) -- 30 minutes/5 times a day 3) Start the DASH diet 4) Consider trying to loss 5-10% of your body weight 5) Return in 3 months for BP check   DASH Eating Plan DASH stands for "Dietary Approaches to Stop Hypertension." The DASH eating plan is a healthy eating plan that has been shown to reduce high blood pressure (hypertension). It may also reduce your risk for type 2 diabetes, heart disease, and stroke. The DASH eating plan may also help with weight loss. What are tips for following this plan?  General guidelines  Avoid eating more than 2,300 mg (milligrams) of salt (sodium) a day. If you have hypertension, you may need to reduce your sodium intake to 1,500 mg a day.  Limit alcohol intake to no more than 1 drink a day for nonpregnant women and 2 drinks a day for men. One drink equals 12 oz of beer, 5 oz of wine, or 1 oz of hard liquor.  Work with your health care provider to maintain a healthy body weight or to lose weight. Ask what an ideal weight is for you.  Get at least 30 minutes of exercise that causes your heart to beat faster (aerobic exercise) most days of the week. Activities may include walking, swimming, or biking.  Work with your health care provider or diet and nutrition specialist (dietitian) to adjust your eating plan to your individual calorie needs. Reading food labels   Check food labels for the amount of sodium per serving. Choose foods with less than 5 percent of the Daily Value of sodium. Generally, foods with less than 300 mg of sodium per serving fit into this eating plan.  To find whole grains, look for the word "whole" as the first word in the ingredient list. Shopping  Buy products labeled as "low-sodium" or "no salt added."  Buy  fresh foods. Avoid canned foods and premade or frozen meals. Cooking  Avoid adding salt when cooking. Use salt-free seasonings or herbs instead of table salt or sea salt. Check with your health care provider or pharmacist before using salt substitutes.  Do not fry foods. Cook foods using healthy methods such as baking, boiling, grilling, and broiling instead.  Cook with heart-healthy oils, such as olive, canola, soybean, or sunflower oil. Meal planning  Eat a balanced diet that includes: ? 5 or more servings of fruits and vegetables each day. At each meal, try to fill half of your plate with fruits and vegetables. ? Up to 6-8 servings of whole grains each day. ? Less than 6 oz of lean meat, poultry, or fish each day. A 3-oz serving of meat is about the same size as a deck of cards. One egg equals 1 oz. ? 2 servings of low-fat dairy each day. ? A serving of nuts, seeds, or beans 5 times each week. ? Heart-healthy fats. Healthy fats called Omega-3 fatty acids are found in foods such as flaxseeds and coldwater fish, like sardines, salmon, and mackerel.  Limit how much you eat of the following: ? Canned or prepackaged foods. ? Food that is high in trans fat, such as fried foods. ? Food that is high in saturated fat, such as fatty meat. ? Sweets, desserts, sugary drinks, and other foods with added sugar. ? Full-fat dairy products.  Do not salt foods before eating.  Try to eat at least 2 vegetarian meals each week.  Eat more home-cooked food and less restaurant, buffet, and fast food.  When eating at a restaurant, ask that your food be prepared with less salt or no salt, if possible. What foods are recommended? The items listed may not be a complete list. Talk with your dietitian about what dietary choices are best for you. Grains Whole-grain or whole-wheat bread. Whole-grain or whole-wheat pasta. Brown rice. [place]. Bulgur. Whole-grain and low-sodium cereals. Pita bread.  Low-fat, low-sodium crackers. Whole-wheat flour tortillas. Vegetables Fresh or frozen vegetables (raw, steamed, roasted, or grilled). Low-sodium or reduced-sodium tomato and vegetable juice. Low-sodium or reduced-sodium tomato sauce and tomato paste. Low-sodium or reduced-sodium canned vegetables. Fruits All fresh, dried, or frozen fruit. Canned fruit in natural juice (without added sugar). Meat and other protein foods Skinless chicken or [place]. Ground chicken or [place]. Pork with fat trimmed off. Fish and seafood. Egg whites. Dried beans, peas, or lentils. Unsalted nuts, nut butters, and seeds. Unsalted canned beans. Lean cuts of beef with fat trimmed off. Low-sodium, lean deli meat. Dairy Low-fat (1%) or fat-free (skim) milk. Fat-free, low-fat, or reduced-fat cheeses. Nonfat, low-sodium ricotta or cottage cheese. Low-fat or nonfat yogurt. Low-fat, low-sodium cheese. Fats and oils Soft margarine without trans fats. Vegetable oil. Low-fat, reduced-fat, or light mayonnaise and salad dressings (reduced-sodium). Canola, safflower, olive, soybean, and sunflower oils. Avocado. Seasoning and other foods Herbs. Spices. Seasoning mixes without salt. Unsalted popcorn and pretzels. Fat-free sweets. What foods are not recommended? The items listed may not be a complete list. Talk with your dietitian about what dietary choices are best for you. Grains Baked goods made with fat, such as croissants, muffins, or some breads. Dry pasta or rice meal packs. Vegetables Creamed or fried vegetables. Vegetables in a cheese sauce. Regular canned vegetables (not low-sodium or reduced-sodium). Regular canned tomato sauce and paste (not low-sodium or reduced-sodium). Regular tomato and vegetable juice (not low-sodium or reduced-sodium). Angie Fava. Olives. Fruits Canned fruit in a light or heavy syrup. Fried fruit. Fruit in cream or butter sauce. Meat and other protein foods Fatty cuts of meat. Ribs. Fried meat. Berniece Salines.  Sausage. Bologna and other processed lunch meats. Salami. Fatback. Hotdogs. Bratwurst. Salted nuts and seeds. Canned beans with added salt. Canned or smoked fish. Whole eggs or egg yolks. Chicken or [place] with skin. Dairy Whole or 2% milk, cream, and half-and-half. Whole or full-fat cream cheese. Whole-fat or sweetened yogurt. Full-fat cheese. Nondairy creamers. Whipped toppings. Processed cheese and cheese spreads. Fats and oils Butter. Stick margarine. Lard. Shortening. Ghee. Bacon fat. Tropical oils, such as coconut, palm kernel, or palm oil. Seasoning and other foods Salted popcorn and pretzels. Onion salt, garlic salt, seasoned salt, table salt, and sea salt. Worcestershire sauce. Tartar sauce. Barbecue sauce. Teriyaki sauce. Soy sauce, including reduced-sodium. Steak sauce. Canned and packaged gravies. Fish sauce. Oyster sauce. Cocktail sauce. Horseradish that you find on the shelf. Ketchup. Mustard. Meat flavorings and tenderizers. Bouillon cubes. Hot sauce and Tabasco sauce. Premade or packaged marinades. Premade or packaged taco seasonings. Relishes. Regular salad dressings. Where to find more information:  National Heart, Lung, and [place]: https://wilson-eaton.com/  American Heart Association: www.heart.org Summary  The DASH eating plan is a healthy eating plan that has been shown to reduce high blood pressure (hypertension). It may also reduce your risk for type 2 diabetes, heart disease, and stroke.  With the DASH eating plan, you should limit salt (sodium)  intake to 2,300 mg a day. If you have hypertension, you may need to reduce your sodium intake to 1,500 mg a day.  When on the DASH eating plan, aim to eat more fresh fruits and vegetables, whole grains, lean proteins, low-fat dairy, and heart-healthy fats.  Work with your health care provider or diet and nutrition specialist (dietitian) to adjust your eating plan to your individual calorie needs. This information is not intended  to replace advice given to you by your health care provider. Make sure you discuss any questions you have with your health care provider. Document Released: 09/17/2011 Document Revised: 09/21/2016 Document Reviewed: 09/21/2016 Elsevier Interactive Patient Education  2019 [employer] Ask your health care provider which exercises are safe for you. Do exercises exactly as told by your health care provider and adjust them as directed. It is normal to feel mild stretching, pulling, tightness, or discomfort as you do these exercises, but you should stop right away if you feel sudden pain or your pain gets worse. Do not begin these exercises until told by your health care provider. Stretching and range of motion exercises These exercises warm up your muscles and joints and improve the movement and flexibility of your back. These exercises also help to relieve pain, numbness, and tingling. Exercise A: Lumbar rotation  1. Lie on your back on a firm surface and bend your knees. 2. Straighten your arms out to your sides so each arm forms an "L" shape with a side of your body (a 90 degree angle). 3. Slowly move both of your knees to one side of your body until you feel a stretch in your lower back. Try not to let your shoulders move off of the floor. 4. Hold for ____15______ seconds. 5. Tense your abdominal muscles and slowly move your knees back to the starting position. 6. Repeat this exercise on the other side of your body. Repeat ____10______ times. Complete this exercise _____2_____ times a day. Exercise B: Prone extension on elbows  1. Lie on your abdomen on a firm surface. 2. Prop yourself up on your elbows. 3. Use your arms to help lift your chest up until you feel a gentle stretch in your abdomen and your lower back. ? This will place some of your body weight on your elbows. If this is uncomfortable, try stacking pillows under your chest. ? Your hips should stay  down, against the surface that you are lying on. Keep your hip and back muscles relaxed. 4. Hold for _____15_____ seconds. 5. Slowly relax your upper body and return to the starting position. Repeat ___10_______ times. Complete this exercise ______2____ times a day. Strengthening exercises These exercises build strength and endurance in your back. Endurance is the ability to use your muscles for a long time, even after they get tired. Exercise C: Pelvic tilt 1. Lie on your back on a firm surface. Bend your knees and keep your feet flat. 2. Tense your abdominal muscles. Tip your pelvis up toward the ceiling and flatten your lower back into the floor. ? To help with this exercise, you may place a small towel under your lower back and try to push your back into the towel. 3. Hold for ___5_______ seconds. 4. Let your muscles relax completely before you repeat this exercise. Repeat ____30______ times. Complete this exercise ___2_______ times a day. Exercise D: Alternating arm and leg raises  1. Get on your hands and knees on a firm surface.  If you are on a hard floor, you may want to use padding to cushion your knees, such as an exercise mat. 2. Line up your arms and legs. Your hands should be below your shoulders, and your knees should be below your hips. 3. Lift your left leg behind you. At the same time, raise your right arm and straighten it in front of you. ? Do not lift your leg higher than your hip. ? Do not lift your arm higher than your shoulder. ? Keep your abdominal and back muscles tight. ? Keep your hips facing the ground. ? Do not arch your back. ? Keep your balance carefully, and do not hold your breath. 4. Hold for ____5______ seconds. 5. Slowly return to the starting position and repeat with your right leg and your left arm. Repeat __15________ times. Complete this exercise _______2___ times a day. Exercise E: Abdominal set with straight leg raise  1. Lie on your back on a  firm surface. 2. Bend one of your knees and keep your other leg straight. 3. Tense your abdominal muscles and lift your straight leg up, 4-6 inches (10-15 cm) off the ground. 4. Keep your abdominal muscles tight and hold for ____5-10______ seconds. ? Do not hold your breath. ? Do not arch your back. Keep it flat against the ground. 5. Keep your abdominal muscles tense as you slowly lower your leg back to the starting position. 6. Repeat with your other leg. Repeat ____20______ times. Complete this exercise ___2_______ times a day. Posture and body mechanics  Body mechanics refers to the movements and positions of your body while you do your daily activities. Posture is part of body mechanics. Good posture and healthy body mechanics can help to relieve stress in your body's tissues and joints. Good posture means that your spine is in its natural S-curve position (your spine is neutral), your shoulders are pulled back slightly, and your head is not tipped forward. The following are general guidelines for applying improved posture and body mechanics to your everyday activities. Standing   When standing, keep your spine neutral and your feet about hip-width apart. Keep a slight bend in your knees. Your ears, shoulders, and hips should line up.  When you do a task in which you stand in one place for a long time, place one foot up on a stable object that is 2-4 inches (5-10 cm) high, such as a footstool. This helps keep your spine neutral. Sitting   When sitting, keep your spine neutral and keep your feet flat on the floor. Use a footrest, if necessary, and keep your thighs parallel to the floor. Avoid rounding your shoulders, and avoid tilting your head forward.  When working at a desk or a computer, keep your desk at a height where your hands are slightly lower than your elbows. Slide your chair under your desk so you are close enough to maintain good posture.  When working at a computer, place  your monitor at a height where you are looking straight ahead and you do not have to tilt your head forward or downward to look at the screen. Resting   When lying down and resting, avoid positions that are most painful for you.  If you have pain with activities such as sitting, bending, stooping, or squatting (flexion-based activities), lie in a position in which your body does not bend very much. For example, avoid curling up on your side with your arms and knees near your chest (fetal  position).  If you have pain with activities such as standing for a long time or reaching with your arms (extension-based activities), lie with your spine in a neutral position and bend your knees slightly. Try the following positions:  Lying on your side with a pillow between your knees.  Lying on your back with a pillow under your knees. Lifting   When lifting objects, keep your feet at least shoulder-width apart and tighten your abdominal muscles.  Bend your knees and hips and keep your spine neutral. It is important to lift using the strength of your legs, not your back. Do not lock your knees straight out.  Always ask for help to lift heavy or awkward objects. This information is not intended to replace advice given to you by your health care provider. Make sure you discuss any questions you have with your health care provider. Document Released: 09/28/2005 Document Revised: 06/04/2016 Document Reviewed: 07/10/2015 Elsevier Interactive Patient Education  2019 [employer].

## 2018-11-09 NOTE — Assessment & Plan Note (Signed)
Some signs of disc pathology with radiating symptoms. Discussed that symptoms generally resolved within 6 weeks. Offered PT, but patient preferred home exercises which were provided. Recommend follow-up in 3 weeks if not improving to considering imagine. No weakness or atrophy, normal sensation

## 2018-11-09 NOTE — Assessment & Plan Note (Signed)
BP appears to be intermittently elevated as far back as 2018. Discussed importance of treating high blood pressure to prevent Heart attack and stroke. She is hesitant to start medication so discussed 3 month trial of diet/exercise which she plans to do. Labs in June fairly normal so did not feel the need to repeat labs today.

## 2018-11-30 ENCOUNTER — Other Ambulatory Visit: Payer: Self-pay

## 2018-11-30 ENCOUNTER — Encounter (HOSPITAL_COMMUNITY): Payer: Self-pay

## 2018-11-30 ENCOUNTER — Emergency Department (HOSPITAL_COMMUNITY): Payer: 59

## 2018-11-30 ENCOUNTER — Emergency Department (HOSPITAL_COMMUNITY)
Admission: EM | Admit: 2018-11-30 | Discharge: 2018-11-30 | Disposition: A | Discharge status: Home / Self Care | Payer: 59 | Attending: Emergency Medicine | Admitting: Emergency Medicine

## 2018-11-30 DIAGNOSIS — Z87891 Personal history of nicotine dependence: Secondary | ICD-10-CM | POA: Diagnosis not present

## 2018-11-30 DIAGNOSIS — R42 Dizziness and giddiness: Secondary | ICD-10-CM | POA: Diagnosis not present

## 2018-11-30 DIAGNOSIS — R0902 Hypoxemia: Secondary | ICD-10-CM | POA: Diagnosis not present

## 2018-11-30 DIAGNOSIS — Z79899 Other long term (current) drug therapy: Secondary | ICD-10-CM | POA: Insufficient documentation

## 2018-11-30 DIAGNOSIS — I10 Essential (primary) hypertension: Secondary | ICD-10-CM | POA: Insufficient documentation

## 2018-11-30 DIAGNOSIS — L720 Epidermal cyst: Secondary | ICD-10-CM | POA: Diagnosis not present

## 2018-11-30 LAB — CBC WITH DIFFERENTIAL/PLATELET
ABS IMMATURE GRANULOCYTES: 0.03 10*3/uL (ref 0.00–0.07)
BASOS ABS: 0 10*3/uL (ref 0.0–0.1)
BASOS PCT: 0 %
EOS ABS: 0.2 10*3/uL (ref 0.0–0.5)
Eosinophils Relative: 2 %
HCT: 42.7 % (ref 36.0–46.0)
Hemoglobin: 13.6 g/dL (ref 12.0–15.0)
IMMATURE GRANULOCYTES: 0 %
Lymphocytes Relative: 30 %
Lymphs Abs: 2.4 10*3/uL (ref 0.7–4.0)
MCH: 29 pg (ref 26.0–34.0)
MCHC: 31.9 g/dL (ref 30.0–36.0)
MCV: 91 fL (ref 80.0–100.0)
MONOS PCT: 7 %
Monocytes Absolute: 0.6 10*3/uL (ref 0.1–1.0)
NEUTROS ABS: 4.9 10*3/uL (ref 1.7–7.7)
NEUTROS PCT: 61 %
PLATELETS: 152 10*3/uL (ref 150–400)
RBC: 4.69 MIL/uL (ref 3.87–5.11)
RDW: 12.4 % (ref 11.5–15.5)
WBC: 8.1 10*3/uL (ref 4.0–10.5)
nRBC: 0 % (ref 0.0–0.2)

## 2018-11-30 LAB — BASIC METABOLIC PANEL
ANION GAP: 10 (ref 5–15)
BUN: 13 mg/dL (ref 6–20)
CALCIUM: 8.7 mg/dL — AB (ref 8.9–10.3)
CO2: 22 mmol/L (ref 22–32)
Chloride: 105 mmol/L (ref 98–111)
Creatinine, Ser: 0.73 mg/dL (ref 0.44–1.00)
GLUCOSE: 110 mg/dL — AB (ref 70–99)
Potassium: 3.8 mmol/L (ref 3.5–5.1)
SODIUM: 137 mmol/L (ref 135–145)

## 2018-11-30 MED ORDER — MECLIZINE HCL 25 MG PO TABS
50.0000 mg | ORAL_TABLET | Freq: Once | ORAL | Status: AC
  Administered 2018-11-30: 50 mg via ORAL
  Filled 2018-11-30: qty 2

## 2018-11-30 MED ORDER — MECLIZINE HCL 25 MG PO TABS: 25.0000 mg | ORAL_TABLET | Freq: Three times a day (TID) | ORAL | 0 refills | Status: DC | PRN

## 2018-11-30 MED ORDER — ONDANSETRON 4 MG PO TBDP: 4.0000 mg | ORAL_TABLET | Freq: Three times a day (TID) | ORAL | 0 refills | Status: DC | PRN

## 2018-11-30 MED ORDER — ONDANSETRON HCL 4 MG/2ML IJ SOLN
4.0000 mg | Freq: Once | INTRAMUSCULAR | Status: AC
  Administered 2018-11-30: 4 mg via INTRAVENOUS
  Filled 2018-11-30: qty 2

## 2018-11-30 NOTE — ED Provider Notes (Signed)
[facility] EMERGENCY DEPARTMENT Provider Note   CSN: [id number] Arrival date & time: 11/30/18  1953    History   Chief Complaint Chief Complaint  Patient presents with  . Dizziness    HPI Virginia Oneal is a 53 y.o. female.     53 year old female with prior medical history as detailed below presents for evaluation of acute onset vertiginous symptoms.  Patient reports that she was at home in her kitchen.  She had sudden onset of "feeling like the room was spinning."  Symptoms were bad enough that the patient had to lie down on her couch.  She reports that her dizziness was associated with nausea without vomiting.  She denies syncopal event.  She denies associated chest pain or shortness of breath.  She denies diaphoresis, fever, or other complaint.    She denies prior episodes or history of vertigo.  Upon arrival to the ED she feels significantly better.  She was given Zofran  enroute by EMS.  The history is provided by the patient and medical records.  Dizziness  Quality:  Vertigo Severity:  Moderate Onset quality:  Gradual Duration:  4 hours Timing:  Rare Progression:  Improving Chronicity:  New Context: standing up   Relieved by:  Nothing Worsened by:  Nothing Ineffective treatments:  None tried Associated symptoms: no chest pain, no shortness of breath, no syncope, no tinnitus, no vision changes, no vomiting and no weakness     Past Medical History:  Diagnosis Date  . Abnormal uterine bleeding   . Anxiety    postpartum and hx panic attacks  . Arrhythmia   . Chest mass    by CT scanning  . Depression   . Gestational diabetes   . Hx of migraines   . Kidney stones   . Personal history of colonic polyps   . Seasonal allergies   . Urinary incontinence 05/2009   hx bladder sling--Dr. Quincy Simmonds    Patient Active Problem List   Diagnosis Date Noted  . Essential hypertension 11/09/2018  . Strain of lumbar region 11/09/2018  . Family history of  colon cancer in mother and 2 aunts 06/03/2018  . Abdominal pain 02/24/2017  . Mediastinal cyst 02/24/2017  . Chest mass 10/08/2015  . Lipoma 08/13/2015  . Pedal edema 07/26/2014  . Chest pain 07/26/2014  . DOE (dyspnea on exertion) 07/26/2014  . Personal history of colonic polyps 01/18/2013  . Rotator cuff tendinitis 06/29/2011  . Anxiety 06/29/2011  . DIABETES MELLITUS, GESTATIONAL, HX OF 08/30/2008  . PREMATURE ATRIAL CONTRACTIONS 08/15/2008  . RENAL CALCULUS, HX OF 08/15/2008    Past Surgical History:  Procedure Laterality Date  . ABDOMINAL HYSTERECTOMY  05/2009   TAH/LSO  . BLADDER SUSPENSION  2010   Dr. Quincy Simmonds  . BREAST BIOPSY     left stereo  . CESAREAN SECTION  03/2003  . CESAREAN SECTION  2004  . COLONOSCOPY  09/08  . COLPORRHAPHY  05/2009   Dr. Quincy Simmonds  . ENDOMETRIAL ABLATION  2006   Dr. Quincy Simmonds  . INCONTINENCE SURGERY     sling for urinary incontinence  . LAPAROSCOPIC ENDOMETRIOSIS FULGURATION     ablation  . OOPHORECTOMY  2010   w/hyst--Dr. Quincy Simmonds  . TUBAL LIGATION  2004   w/ C-section Dr. Quincy Simmonds  . TYMPANOPLASTY     right ear  . VAGINAL HYSTERECTOMY  05/21/2009   lap assisted /partial     OB History    Gravida  2   Para  2   Term  2   Preterm      AB      Living  2     SAB      TAB      Ectopic      Multiple      Live Births  2            Home Medications    Prior to Admission medications   Medication Sig Start Date End Date Taking? Authorizing Provider  betamethasone dipropionate (DIPROLENE) 0.05 % cream Apply topically 2 (two) times daily. For no more than 10 days. 03/16/18  Yes Elby Beck, FNP  cetirizine (ZYRTEC ALLERGY) 10 MG tablet Take 10 mg by mouth daily.  01/10/18  Yes [provider]  Cyanocobalamin (B-12) 100 MCG TABS Take 1 tablet by mouth daily.  01/10/18  Yes [provider]  LORazepam (ATIVAN) 0.5 MG tablet Take 1 tablet (0.5 mg total) by mouth every 8 (eight) hours as needed for anxiety. 03/16/18   Yes Elby Beck, FNP  meclizine (ANTIVERT) 25 MG tablet Take 1 tablet (25 mg total) by mouth 3 (three) times daily as needed for dizziness. 11/30/18   Valarie Merino, MD  ondansetron (ZOFRAN ODT) 4 MG disintegrating tablet Take 1 tablet (4 mg total) by mouth every 8 (eight) hours as needed for nausea or vomiting. 11/30/18   Valarie Merino, MD    Family History Family History  Problem Relation Age of Onset  . Kidney disease Mother        renal failure  . Stroke Mother   . Diabetes Mother   . Cancer - Colon Mother        deceased age 8  . Hypertension Mother   . Heart failure Mother   . Stroke Father   . Cancer Maternal Aunt        colon  . Colon cancer Maternal Aunt   . Hypertension Sister   . Rheum arthritis Sister   . Hypertension Brother   . Cancer - Colon Paternal Aunt        deceased  . Hypertension Sister   . Hypertension Sister   . Hypertension Sister   . Esophageal cancer Neg Hx   . Rectal cancer Neg Hx   . Stomach cancer Neg Hx   . Breast cancer Neg Hx     Social History Social History   Tobacco Use  . Smoking status: Former Smoker    Types: Cigarettes    Last attempt to quit: 01/06/1999    Years since quitting: 19.9  . Smokeless tobacco: Never Used  Substance Use Topics  . Alcohol use: No    Alcohol/week: 0.0 standard drinks  . Drug use: No     Allergies   Buspar [buspirone hcl]   Review of Systems Review of Systems  HENT: Negative for tinnitus.   Respiratory: Negative for shortness of breath.   Cardiovascular: Negative for chest pain and syncope.  Gastrointestinal: Negative for vomiting.  Neurological: Positive for dizziness. Negative for weakness.  All other systems reviewed and are negative.    Physical Exam Updated Vital Signs BP (!) 176/100 (BP Location: Right Arm)   Pulse 100   Temp 98 F (36.7 C) (Oral)   Resp 18   Ht 5\' 6"  (1.676 m)   Wt 108.9 kg   SpO2 95%   BMI 38.74 kg/m   Physical Exam Vitals signs and  nursing note reviewed.  Constitutional:  General: She is not in acute distress.    Appearance: She is well-developed.  HENT:     Head: Normocephalic and atraumatic.  Eyes:     Conjunctiva/sclera: Conjunctivae normal.     Pupils: Pupils are equal, round, and reactive to light.  Neck:     Musculoskeletal: Normal range of motion and neck supple.  Cardiovascular:     Rate and Rhythm: Normal rate and regular rhythm.     Heart sounds: Normal heart sounds.  Pulmonary:     Effort: Pulmonary effort is normal. No respiratory distress.     Breath sounds: Normal breath sounds.  Abdominal:     General: There is no distension.     Palpations: Abdomen is soft.     Tenderness: There is no abdominal tenderness.  Musculoskeletal: Normal range of motion.        General: No deformity.  Skin:    General: Skin is warm and dry.  Neurological:     General: No focal deficit present.     Mental Status: She is alert and oriented to person, place, and time. Mental status is at baseline.     Cranial Nerves: No cranial nerve deficit.     Sensory: No sensory deficit.     Motor: No weakness.     Coordination: Coordination normal.     Gait: Gait normal.     Comments: Mild fatigable bilateral horizontal nystagmus      ED Treatments / Results  Labs (all labs ordered are listed, but only abnormal results are displayed) Labs Reviewed  BASIC METABOLIC PANEL - Abnormal; Notable for the following components:      Result Value   Glucose, Bld 110 (*)    Calcium 8.7 (*)    All other components within normal limits  CBC WITH DIFFERENTIAL/PLATELET    EKG EKG Interpretation  Date/Time:  Wednesday November 30 2018 19:53:21 EST Ventricular Rate:  91 PR Interval:    QRS Duration: 90 QT Interval:  369 QTC Calculation: 454 R Axis:   71 Text Interpretation:  Sinus rhythm Multiple ventricular premature complexes Left atrial enlargement Anteroseptal infarct, age indeterminate Confirmed by Dene Gentry  [phone]) on 11/30/2018 8:02:27 PM   Radiology Ct Head Wo Contrast  Result Date: 11/30/2018 CLINICAL DATA:  Dizzy and nauseous EXAM: CT HEAD WITHOUT CONTRAST TECHNIQUE: Contiguous axial images were obtained from the base of the skull through the vertex without intravenous contrast. COMPARISON:  None. FINDINGS: Brain: No acute territorial infarction, hemorrhage or intracranial mass. The ventricles are nonenlarged. Vascular: No hyperdense vessels. Scattered carotid vascular calcification Skull: Normal. Negative for fracture or focal lesion. Sinuses/Orbits: No acute finding. Other: None IMPRESSION: Negative.  No CT evidence for acute intracranial abnormality. Electronically Signed   By: Donavan Foil M.D.   On: 11/30/2018 20:44    Procedures Procedures (including critical care time)  Medications Ordered in ED Medications  meclizine (ANTIVERT) tablet 50 mg (50 mg Oral Given 11/30/18 2022)  ondansetron (ZOFRAN) injection 4 mg (4 mg Intravenous Given 11/30/18 2022)     Initial Impression / Assessment and Plan / ED Course  I have reviewed the triage vital signs and the nursing notes.  Pertinent labs & imaging results that were available during my care of the patient were reviewed by me and considered in my medical decision making (see chart for details).        MDM  Screen complete  Patient is presenting for evaluation of reported vertiginous symptoms.  History and exam are consistent with  likely benign positional vertigo.  Work-up does not demonstrate other evidence of significant pathology.  Following administration of meclizine and Zofran in the ED patient feels significantly improved.  She is ambulatory around the department prior to discharge.  She is without evidence of significant neurologic deficit.  Patient does understand the need for close follow-up.  Strict return precautions given and understood.  Of note, patient is currently being worked up and observed by her primary care  provider for hypertension.  Blood pressures observed during this visit are consistent with her typical reads.  She is not currently taking antihypertensives.  She is precautioned that close follow-up with her primary care provider is important given that she may require antihypertensive medications in the near future.   Final Clinical Impressions(s) / ED Diagnoses   Final diagnoses:  Vertigo    ED Discharge Orders         Ordered    ondansetron (ZOFRAN ODT) 4 MG disintegrating tablet  Every 8 hours PRN     11/30/18 2140    meclizine (ANTIVERT) 25 MG tablet  3 times daily PRN     11/30/18 2140           Valarie Merino, MD 11/30/18 2146

## 2018-11-30 NOTE — Discharge Instructions (Signed)
Return for any problem.  Follow-up with your regular care provider as instructed. °

## 2018-11-30 NOTE — ED Notes (Signed)
Patient verbalizes understanding of discharge instructions. Opportunity for questioning and answers were provided. Armband removed by staff, pt discharged from ED.  

## 2018-11-30 NOTE — ED Triage Notes (Signed)
[facility] reports pt felt fine today until 6-6:30 and began to feel dizzy and nauseous, with a "heavy" feeling all over. Pt given 4mg  of Zofran. cbg 134 190/130.

## 2018-12-05 ENCOUNTER — Ambulatory Visit: Payer: 59 | Admitting: Family Medicine

## 2018-12-05 ENCOUNTER — Encounter: Payer: Self-pay | Admitting: Family Medicine

## 2018-12-05 VITALS — BP 160/84 | HR 90 | Temp 97.7°F | Resp 16 | Ht 67.0 in | Wt 249.5 lb

## 2018-12-05 DIAGNOSIS — I10 Essential (primary) hypertension: Secondary | ICD-10-CM | POA: Diagnosis not present

## 2018-12-05 MED ORDER — HYDROCHLOROTHIAZIDE 25 MG PO TABS: 25.0000 mg | ORAL_TABLET | Freq: Every day | ORAL | 3 refills | Status: DC

## 2018-12-05 NOTE — Patient Instructions (Signed)
Follow up in 2 weeks for labs and blood pressure check   Hypertension Hypertension, commonly called high blood pressure, is when the force of blood pumping through the arteries is too strong. The arteries are the blood vessels that carry blood from the heart throughout the body. Hypertension forces the heart to work harder to pump blood and may cause arteries to become narrow or stiff. Having untreated or uncontrolled hypertension can cause heart attacks, strokes, kidney disease, and other problems. A blood pressure reading consists of a higher number over a lower number. Ideally, your blood pressure should be below 120/80. The first ("top") number is called the systolic pressure. It is a measure of the pressure in your arteries as your heart beats. The second ("bottom") number is called the diastolic pressure. It is a measure of the pressure in your arteries as the heart relaxes. What are the causes? The cause of this condition is not known. What increases the risk? Some risk factors for high blood pressure are under your control. Others are not. Factors you can change  Smoking.  Having type 2 diabetes mellitus, high cholesterol, or both.  Not getting enough exercise or physical activity.  Being overweight.  Having too much fat, sugar, calories, or salt (sodium) in your diet.  Drinking too much alcohol. Factors that are difficult or impossible to change  Having chronic kidney disease.  Having a family history of high blood pressure.  Age. Risk increases with age.  Race. You may be at higher risk if you are African-American.  Gender. Men are at higher risk than women before age 51. After age 43, women are at higher risk than men.  Having obstructive sleep apnea.  Stress. What are the signs or symptoms? Extremely high blood pressure (hypertensive crisis) may cause:  Headache.  Anxiety.  Shortness of breath.  Nosebleed.  Nausea and vomiting.  Severe chest  pain.  Jerky movements you cannot control (seizures). How is this diagnosed? This condition is diagnosed by measuring your blood pressure while you are seated, with your arm resting on a surface. The cuff of the blood pressure monitor will be placed directly against the skin of your upper arm at the level of your heart. It should be measured at least twice using the same arm. Certain conditions can cause a difference in blood pressure between your right and left arms. Certain factors can cause blood pressure readings to be lower or higher than normal (elevated) for a short period of time:  When your blood pressure is higher when you are in a health care provider's office than when you are at home, this is called white coat hypertension. Most people with this condition do not need medicines.  When your blood pressure is higher at home than when you are in a health care provider's office, this is called masked hypertension. Most people with this condition may need medicines to control blood pressure. If you have a high blood pressure reading during one visit or you have normal blood pressure with other risk factors:  You may be asked to return on a different day to have your blood pressure checked again.  You may be asked to monitor your blood pressure at home for 1 week or longer. If you are diagnosed with hypertension, you may have other blood or imaging tests to help your health care provider understand your overall risk for other conditions. How is this treated? This condition is treated by making healthy lifestyle changes, such as eating  healthy foods, exercising more, and reducing your alcohol intake. Your health care provider may prescribe medicine if lifestyle changes are not enough to get your blood pressure under control, and if:  Your systolic blood pressure is above 130.  Your diastolic blood pressure is above 80. Your personal target blood pressure may vary depending on your medical  conditions, your age, and other factors. Follow these instructions at home: Eating and drinking   Eat a diet that is high in fiber and potassium, and low in sodium, added sugar, and fat. An example eating plan is called the DASH (Dietary Approaches to Stop Hypertension) diet. To eat this way: ? Eat plenty of fresh fruits and vegetables. Try to fill half of your plate at each meal with fruits and vegetables. ? Eat whole grains, such as whole wheat pasta, brown rice, or whole grain bread. Fill about one quarter of your plate with whole grains. ? Eat or drink low-fat dairy products, such as skim milk or low-fat yogurt. ? Avoid fatty cuts of meat, processed or cured meats, and poultry with skin. Fill about one quarter of your plate with lean proteins, such as fish, chicken without skin, beans, eggs, and tofu. ? Avoid premade and processed foods. These tend to be higher in sodium, added sugar, and fat.  Reduce your daily sodium intake. Most people with hypertension should eat less than 1,500 mg of sodium a day.  Limit alcohol intake to no more than 1 drink a day for nonpregnant women and 2 drinks a day for men. One drink equals 12 oz of beer, 5 oz of wine, or 1 oz of hard liquor. Lifestyle   Work with your health care provider to maintain a healthy body weight or to lose weight. Ask what an ideal weight is for you.  Get at least 30 minutes of exercise that causes your heart to beat faster (aerobic exercise) most days of the week. Activities may include walking, swimming, or biking.  Include exercise to strengthen your muscles (resistance exercise), such as pilates or lifting weights, as part of your weekly exercise routine. Try to do these types of exercises for 30 minutes at least 3 days a week.  Do not use any products that contain nicotine or tobacco, such as cigarettes and e-cigarettes. If you need help quitting, ask your health care provider.  Monitor your blood pressure at home as told by  your health care provider.  Keep all follow-up visits as told by your health care provider. This is important. Medicines  Take over-the-counter and prescription medicines only as told by your health care provider. Follow directions carefully. Blood pressure medicines must be taken as prescribed.  Do not skip doses of blood pressure medicine. Doing this puts you at risk for problems and can make the medicine less effective.  Ask your health care provider about side effects or reactions to medicines that you should watch for. Contact a health care provider if:  You think you are having a reaction to a medicine you are taking.  You have headaches that keep coming back (recurring).  You feel dizzy.  You have swelling in your ankles.  You have trouble with your vision. Get help right away if:  You develop a severe headache or confusion.  You have unusual weakness or numbness.  You feel faint.  You have severe pain in your chest or abdomen.  You vomit repeatedly.  You have trouble breathing. Summary  Hypertension is when the force of blood pumping  through your arteries is too strong. If this condition is not controlled, it may put you at risk for serious complications.  Your personal target blood pressure may vary depending on your medical conditions, your age, and other factors. For most people, a normal blood pressure is less than 120/80.  Hypertension is treated with lifestyle changes, medicines, or a combination of both. Lifestyle changes include weight loss, eating a healthy, low-sodium diet, exercising more, and limiting alcohol. This information is not intended to replace advice given to you by your health care provider. Make sure you discuss any questions you have with your health care provider. Document Released: 09/28/2005 Document Revised: 08/26/2016 Document Reviewed: 08/26/2016 Elsevier Interactive Patient Education  2019 [employer].

## 2018-12-05 NOTE — Progress Notes (Signed)
Subjective:    Patient ID: Virginia Oneal, female    DOB: November 06, 1965, 53 y.o.   MRN: [id number]  HPI This is a 53 yo female who presents today for follow up of vertigo and elevated blood pressure. She was seen in ER 11/30/18 for sudden onset vertigo. Blood pressure was very elevated. She had negative head CT. Vertigo has since resolved.  She has been taking her blood pressure at home, readings reviewed today, range from 150-180s/80-90s. Comparison of blood pressures on manual cuff in office today show wide disparity in readings with patient's home cuff reading 20-30 points higher.  She denies chest pain, palpitations, always has some mild ankle swelling. Last eye exam 11/19.   Past Medical History:  Diagnosis Date  . Abnormal uterine bleeding   . Anxiety    postpartum and hx panic attacks  . Arrhythmia   . Chest mass    by CT scanning  . Depression   . Gestational diabetes   . Hx of migraines   . Kidney stones   . Personal history of colonic polyps   . Seasonal allergies   . Urinary incontinence 05/2009   hx bladder sling--Dr. Quincy Simmonds   Past Surgical History:  Procedure Laterality Date  . ABDOMINAL HYSTERECTOMY  05/2009   TAH/LSO  . BLADDER SUSPENSION  2010   Dr. Quincy Simmonds  . BREAST BIOPSY     left stereo  . CESAREAN SECTION  03/2003  . CESAREAN SECTION  2004  . COLONOSCOPY  09/08  . COLPORRHAPHY  05/2009   Dr. Quincy Simmonds  . ENDOMETRIAL ABLATION  2006   Dr. Quincy Simmonds  . INCONTINENCE SURGERY     sling for urinary incontinence  . LAPAROSCOPIC ENDOMETRIOSIS FULGURATION     ablation  . OOPHORECTOMY  2010   w/hyst--Dr. Quincy Simmonds  . TUBAL LIGATION  2004   w/ C-section Dr. Quincy Simmonds  . TYMPANOPLASTY     right ear  . VAGINAL HYSTERECTOMY  05/21/2009   lap assisted /partial   Family History  Problem Relation Age of Onset  . Kidney disease Mother        renal failure  . Stroke Mother   . Diabetes Mother   . Cancer - Colon Mother        deceased age 39  . Hypertension Mother   . Heart  failure Mother   . Stroke Father   . Cancer Maternal Aunt        colon  . Colon cancer Maternal Aunt   . Hypertension Sister   . Rheum arthritis Sister   . Hypertension Brother   . Cancer - Colon Paternal Aunt        deceased  . Hypertension Sister   . Hypertension Sister   . Hypertension Sister   . Esophageal cancer Neg Hx   . Rectal cancer Neg Hx   . Stomach cancer Neg Hx   . Breast cancer Neg Hx    Social History   Tobacco Use  . Smoking status: Former Smoker    Types: Cigarettes    Last attempt to quit: 01/06/1999    Years since quitting: 19.9  . Smokeless tobacco: Never Used  Substance Use Topics  . Alcohol use: No    Alcohol/week: 0.0 standard drinks  . Drug use: No      Review of Systems Per HPI    Objective:   Physical Exam Physical Exam  Constitutional: Oriented to person, place, and time. She appears well-developed and well-nourished.  HENT:  Head:  Normocephalic and atraumatic.  Eyes: Conjunctivae are normal.  Neck: Normal range of motion. Neck supple.  Cardiovascular: Normal rate, regular rhythm and normal heart sounds.   Pulmonary/Chest: Effort normal and breath sounds normal.  Musculoskeletal: Trace pretibial edema.   Neurological: Alert and oriented to person, place, and time.  Skin: Skin is warm and dry.  Psychiatric: Normal mood and affect. Behavior is normal. Judgment and thought content normal.  Vitals reviewed.   BP (!) 150/96 (BP Location: Left Arm, Patient Position: Sitting)   Pulse 90   Temp 97.7 F (36.5 C) (Oral)   Resp 16   Ht 5\' 7"  (1.702 m)   Wt 249 lb 8 oz (113.2 kg)   SpO2 98%   BMI 39.08 kg/m  Wt Readings from Last 3 Encounters:  12/05/18 249 lb 8 oz (113.2 kg)  11/30/18 240 lb (108.9 kg)  11/09/18 249 lb 8 oz (113.2 kg)   BP Readings from Last 3 Encounters:  12/05/18 (!) 150/96  11/30/18 (!) 161/99  11/09/18 (!) 154/92   Recheck BP- BP: (!) 160/84        Assessment & Plan:  1. Essential hypertension -  Provided written and verbal information regarding diagnosis and treatment. - follow up BP and BMET in 2 weeks, she was advised to stop using her current bp cuff due to inaccuracies in readings - hydrochlorothiazide (HYDRODIURIL) 25 MG tablet; Take 1 tablet (25 mg total) by mouth daily.  Dispense: 90 tablet; Refill: Tucker, FNP-BC  [facility]  12/05/2018 9:04 AM

## 2018-12-05 NOTE — Progress Notes (Signed)
Subjective:    Patient ID: Virginia Oneal, female    DOB: 1966/02/23, 53 y.o.   MRN: [id number]  HPI This is a 53 yo female being seen today for an ER follow-up and blood pressure issues. [name] experienced a hypertensive episode and family called 911, where she was subsequently assessed and treated at the ER. She was going about normal daily activities when she experienced vertigo and blood pressures in the 200's/100's.  Past Medical History:  Diagnosis Date  . Abnormal uterine bleeding   . Anxiety    postpartum and hx panic attacks  . Arrhythmia   . Chest mass    by CT scanning  . Depression   . Gestational diabetes   . Hx of migraines   . Kidney stones   . Personal history of colonic polyps   . Seasonal allergies   . Urinary incontinence 05/2009   hx bladder sling--Dr. Quincy Simmonds   Past Surgical History:  Procedure Laterality Date  . ABDOMINAL HYSTERECTOMY  05/2009   TAH/LSO  . BLADDER SUSPENSION  2010   Dr. Quincy Simmonds  . BREAST BIOPSY     left stereo  . CESAREAN SECTION  03/2003  . CESAREAN SECTION  2004  . COLONOSCOPY  09/08  . COLPORRHAPHY  05/2009   Dr. Quincy Simmonds  . ENDOMETRIAL ABLATION  2006   Dr. Quincy Simmonds  . INCONTINENCE SURGERY     sling for urinary incontinence  . LAPAROSCOPIC ENDOMETRIOSIS FULGURATION     ablation  . OOPHORECTOMY  2010   w/hyst--Dr. Quincy Simmonds  . TUBAL LIGATION  2004   w/ C-section Dr. Quincy Simmonds  . TYMPANOPLASTY     right ear  . VAGINAL HYSTERECTOMY  05/21/2009   lap assisted /partial   Family History  Problem Relation Age of Onset  . Kidney disease Mother        renal failure  . Stroke Mother   . Diabetes Mother   . Cancer - Colon Mother        deceased age 87  . Hypertension Mother   . Heart failure Mother   . Stroke Father   . Cancer Maternal Aunt        colon  . Colon cancer Maternal Aunt   . Hypertension Sister   . Rheum arthritis Sister   . Hypertension Brother   . Cancer - Colon Paternal Aunt        deceased  . Hypertension Sister   .  Hypertension Sister   . Hypertension Sister   . Esophageal cancer Neg Hx   . Rectal cancer Neg Hx   . Stomach cancer Neg Hx   . Breast cancer Neg Hx    Social History   Tobacco Use  . Smoking status: Former Smoker    Types: Cigarettes    Last attempt to quit: 01/06/1999    Years since quitting: 19.9  . Smokeless tobacco: Never Used  Substance Use Topics  . Alcohol use: No    Alcohol/week: 0.0 standard drinks  . Drug use: No      Review of Systems  Respiratory: Negative.   Cardiovascular: Positive for leg swelling.  Gastrointestinal: Negative.   Genitourinary: Negative.   Neurological: Negative.   All other systems reviewed and are negative.  Per HPI     Objective:   Physical Exam Vitals signs reviewed.  Constitutional:      Appearance: Normal appearance.  HENT:     Head: Normocephalic.  Cardiovascular:     Rate and Rhythm: Normal  rate and regular rhythm.     Pulses: Normal pulses.     Heart sounds: Normal heart sounds.  Pulmonary:     Effort: Pulmonary effort is normal.     Breath sounds: Normal breath sounds.  Musculoskeletal:     Right lower leg: 1+ Pitting Edema present.     Left lower leg: 1+ Pitting Edema present.  Skin:    General: Skin is warm and dry.  Neurological:     Mental Status: She is alert and oriented to person, place, and time.  Psychiatric:        Attention and Perception: Attention and perception normal.        Mood and Affect: Mood normal.        Behavior: Behavior normal. Behavior is cooperative.       Assessment & Plan:  Essential hypertension - Plan: hydrochlorothiazide (HYDRODIURIL) 25 MG tablet   Patient Instructions  Follow up in 2 weeks for labs and blood pressure check   Hypertension Hypertension, commonly called high blood pressure, is when the force of blood pumping through the arteries is too strong. The arteries are the blood vessels that carry blood from the heart throughout the body. Hypertension forces the heart  to work harder to pump blood and may cause arteries to become narrow or stiff. Having untreated or uncontrolled hypertension can cause heart attacks, strokes, kidney disease, and other problems. A blood pressure reading consists of a higher number over a lower number. Ideally, your blood pressure should be below 120/80. The first ("top") number is called the systolic pressure. It is a measure of the pressure in your arteries as your heart beats. The second ("bottom") number is called the diastolic pressure. It is a measure of the pressure in your arteries as the heart relaxes. What are the causes? The cause of this condition is not known. What increases the risk? Some risk factors for high blood pressure are under your control. Others are not. Factors you can change  Smoking.  Having type 2 diabetes mellitus, high cholesterol, or both.  Not getting enough exercise or physical activity.  Being overweight.  Having too much fat, sugar, calories, or salt (sodium) in your diet.  Drinking too much alcohol. Factors that are difficult or impossible to change  Having chronic kidney disease.  Having a family history of high blood pressure.  Age. Risk increases with age.  Race. You may be at higher risk if you are African-American.  Gender. Men are at higher risk than women before age 72. After age 70, women are at higher risk than men.  Having obstructive sleep apnea.  Stress. What are the signs or symptoms? Extremely high blood pressure (hypertensive crisis) may cause:  Headache.  Anxiety.  Shortness of breath.  Nosebleed.  Nausea and vomiting.  Severe chest pain.  Jerky movements you cannot control (seizures). How is this diagnosed? This condition is diagnosed by measuring your blood pressure while you are seated, with your arm resting on a surface. The cuff of the blood pressure monitor will be placed directly against the skin of your upper arm at the level of your heart.  It should be measured at least twice using the same arm. Certain conditions can cause a difference in blood pressure between your right and left arms. Certain factors can cause blood pressure readings to be lower or higher than normal (elevated) for a short period of time:  When your blood pressure is higher when you are in a health  care provider's office than when you are at home, this is called white coat hypertension. Most people with this condition do not need medicines.  When your blood pressure is higher at home than when you are in a health care provider's office, this is called masked hypertension. Most people with this condition may need medicines to control blood pressure. If you have a high blood pressure reading during one visit or you have normal blood pressure with other risk factors:  You may be asked to return on a different day to have your blood pressure checked again.  You may be asked to monitor your blood pressure at home for 1 week or longer. If you are diagnosed with hypertension, you may have other blood or imaging tests to help your health care provider understand your overall risk for other conditions. How is this treated? This condition is treated by making healthy lifestyle changes, such as eating healthy foods, exercising more, and reducing your alcohol intake. Your health care provider may prescribe medicine if lifestyle changes are not enough to get your blood pressure under control, and if:  Your systolic blood pressure is above 130.  Your diastolic blood pressure is above 80. Your personal target blood pressure may vary depending on your medical conditions, your age, and other factors. Follow these instructions at home: Eating and drinking   Eat a diet that is high in fiber and potassium, and low in sodium, added sugar, and fat. An example eating plan is called the DASH (Dietary Approaches to Stop Hypertension) diet. To eat this way: ? Eat plenty of fresh  fruits and vegetables. Try to fill half of your plate at each meal with fruits and vegetables. ? Eat whole grains, such as whole wheat pasta, brown rice, or whole grain bread. Fill about one quarter of your plate with whole grains. ? Eat or drink low-fat dairy products, such as skim milk or low-fat yogurt. ? Avoid fatty cuts of meat, processed or cured meats, and poultry with skin. Fill about one quarter of your plate with lean proteins, such as fish, chicken without skin, beans, eggs, and tofu. ? Avoid premade and processed foods. These tend to be higher in sodium, added sugar, and fat.  Reduce your daily sodium intake. Most people with hypertension should eat less than 1,500 mg of sodium a day.  Limit alcohol intake to no more than 1 drink a day for nonpregnant women and 2 drinks a day for men. One drink equals 12 oz of beer, 5 oz of wine, or 1 oz of hard liquor. Lifestyle   Work with your health care provider to maintain a healthy body weight or to lose weight. Ask what an ideal weight is for you.  Get at least 30 minutes of exercise that causes your heart to beat faster (aerobic exercise) most days of the week. Activities may include walking, swimming, or biking.  Include exercise to strengthen your muscles (resistance exercise), such as pilates or lifting weights, as part of your weekly exercise routine. Try to do these types of exercises for 30 minutes at least 3 days a week.  Do not use any products that contain nicotine or tobacco, such as cigarettes and e-cigarettes. If you need help quitting, ask your health care provider.  Monitor your blood pressure at home as told by your health care provider.  Keep all follow-up visits as told by your health care provider. This is important. Medicines  Take over-the-counter and prescription medicines only as told  by your health care provider. Follow directions carefully. Blood pressure medicines must be taken as prescribed.  Do not skip  doses of blood pressure medicine. Doing this puts you at risk for problems and can make the medicine less effective.  Ask your health care provider about side effects or reactions to medicines that you should watch for. Contact a health care provider if:  You think you are having a reaction to a medicine you are taking.  You have headaches that keep coming back (recurring).  You feel dizzy.  You have swelling in your ankles.  You have trouble with your vision. Get help right away if:  You develop a severe headache or confusion.  You have unusual weakness or numbness.  You feel faint.  You have severe pain in your chest or abdomen.  You vomit repeatedly.  You have trouble breathing. Summary  Hypertension is when the force of blood pumping through your arteries is too strong. If this condition is not controlled, it may put you at risk for serious complications.  Your personal target blood pressure may vary depending on your medical conditions, your age, and other factors. For most people, a normal blood pressure is less than 120/80.  Hypertension is treated with lifestyle changes, medicines, or a combination of both. Lifestyle changes include weight loss, eating a healthy, low-sodium diet, exercising more, and limiting alcohol. This information is not intended to replace advice given to you by your health care provider. Make sure you discuss any questions you have with your health care provider. Document Released: 09/28/2005 Document Revised: 08/26/2016 Document Reviewed: 08/26/2016 Elsevier Interactive Patient Education  2019 [employer].

## 2018-12-19 ENCOUNTER — Ambulatory Visit: Payer: 59 | Admitting: Family Medicine

## 2018-12-19 ENCOUNTER — Encounter: Payer: Self-pay | Admitting: Family Medicine

## 2018-12-19 VITALS — BP 144/86 | HR 77 | Temp 97.5°F | Ht 67.0 in | Wt 249.5 lb

## 2018-12-19 DIAGNOSIS — I10 Essential (primary) hypertension: Secondary | ICD-10-CM

## 2018-12-19 LAB — BASIC METABOLIC PANEL
BUN: 15 mg/dL (ref 6–23)
CO2: 32 mEq/L (ref 19–32)
Calcium: 9.3 mg/dL (ref 8.4–10.5)
Chloride: 100 mEq/L (ref 96–112)
Creatinine, Ser: 0.76 mg/dL (ref 0.40–1.20)
GFR: 79.13 mL/min (ref >60.00)
Glucose, Bld: 108 mg/dL — ABNORMAL HIGH (ref 70–99)
Potassium: 4.1 mEq/L (ref 3.5–5.1)
Sodium: 140 mEq/L (ref 135–145)

## 2018-12-19 NOTE — Patient Instructions (Signed)
Good to see you today  Continue to watch your salt intake  Follow up in 3 months for your complete physical

## 2018-12-19 NOTE — Progress Notes (Signed)
Subjective:    Patient ID: Virginia Oneal, female    DOB: [birth year], 53 y.o.   MRN: [id number]  HPI This is a 53 yo female who presents today for follow up of HTN. She was seen 12/05/18 and started on HCTZ 25 mg. She is here today to recheck blood pressure, assess tolerance of medication and check BMET. Has had intermittent mild headache, not requiring medication. Last headache 3 days ago.  She has been checking readings at home and getting 130-140s/80s. Did not bring log with her today. No lightheadedness, dizziness or increased urination noted with HCTZ. Watching sodium content of foods. Has switched from canned to frozen when possible, doesn't add salt with cooking.    Past Medical History:  Diagnosis Date  . Abnormal uterine bleeding   . Anxiety    postpartum and hx panic attacks  . Arrhythmia   . Chest mass    by CT scanning  . Depression   . Gestational diabetes   . Hx of migraines   . Kidney stones   . Personal history of colonic polyps   . Seasonal allergies   . Urinary incontinence 05/2009   hx bladder sling--Dr. Quincy Simmonds   Past Surgical History:  Procedure Laterality Date  . ABDOMINAL HYSTERECTOMY  05/2009   TAH/LSO  . BLADDER SUSPENSION  2010   Dr. Quincy Simmonds  . BREAST BIOPSY     left stereo  . CESAREAN SECTION  03/2003  . CESAREAN SECTION  2004  . COLONOSCOPY  09/08  . COLPORRHAPHY  05/2009   Dr. Quincy Simmonds  . ENDOMETRIAL ABLATION  2006   Dr. Quincy Simmonds  . INCONTINENCE SURGERY     sling for urinary incontinence  . LAPAROSCOPIC ENDOMETRIOSIS FULGURATION     ablation  . OOPHORECTOMY  2010   w/hyst--Dr. Quincy Simmonds  . TUBAL LIGATION  2004   w/ C-section Dr. Quincy Simmonds  . TYMPANOPLASTY     right ear  . VAGINAL HYSTERECTOMY  05/21/2009   lap assisted /partial   Family History  Problem Relation Age of Onset  . Kidney disease Mother        renal failure  . Stroke Mother   . Diabetes Mother   . Cancer - Colon Mother        deceased age 43  . Hypertension Mother   . Heart failure  Mother   . Stroke Father   . Cancer Maternal Aunt        colon  . Colon cancer Maternal Aunt   . Hypertension Sister   . Rheum arthritis Sister   . Hypertension Brother   . Cancer - Colon Paternal Aunt        deceased  . Hypertension Sister   . Hypertension Sister   . Hypertension Sister   . Esophageal cancer Neg Hx   . Rectal cancer Neg Hx   . Stomach cancer Neg Hx   . Breast cancer Neg Hx    Social History   Tobacco Use  . Smoking status: Former Smoker    Types: Cigarettes    Last attempt to quit: 01/06/1999    Years since quitting: 19.9  . Smokeless tobacco: Never Used  Substance Use Topics  . Alcohol use: No    Alcohol/week: 0.0 standard drinks  . Drug use: No      Review of Systems Per HPI    Objective:   Physical Exam Vitals signs reviewed.  Constitutional:      General: She is not in acute distress.  Appearance: Normal appearance. She is obese. She is not ill-appearing, toxic-appearing or diaphoretic.  HENT:     Head: Normocephalic and atraumatic.  Cardiovascular:     Rate and Rhythm: Normal rate.  Pulmonary:     Effort: Pulmonary effort is normal.  Neurological:     Mental Status: She is alert and oriented to person, place, and time.  Psychiatric:        Mood and Affect: Mood normal.        Behavior: Behavior normal.        Thought Content: Thought content normal.        Judgment: Judgment normal.      BP (!) 144/86   Pulse 77   Temp (!) 97.5 F (36.4 C)   Ht 5\' 7"  (1.702 m)   Wt 249 lb 8 oz (113.2 kg)   BMI 39.08 kg/m  Wt Readings from Last 3 Encounters:  12/19/18 249 lb 8 oz (113.2 kg)  12/05/18 249 lb 8 oz (113.2 kg)  11/30/18 240 lb (108.9 kg)   BP Readings from Last 3 Encounters:  12/19/18 (!) 144/86  12/05/18 (!) 160/84  11/30/18 (!) 161/99        Assessment & Plan:  1. Essential hypertension - improved - follow up in 3 months for CPE - continue to watch sodium intake - Basic Metabolic Panel   Clarene Reamer,  FNP-BC  [facility] at [facility], [place]  12/19/2018 9:14 AM

## 2018-12-19 NOTE — Progress Notes (Addendum)
Subjective:    Patient ID: Virginia Oneal, female    DOB: 03/03/1966, 53 y.o.   MRN: [id number]  HPI  This is a 53 yo female being seen today for a follow up on BP and new medication.  Wakes up with a headache most days but not enough to take medications for them. She does limit salts by reading more labels and not eating canned foods.  Patient denies CP/SOB, stomach bothered 3 nights ago (12/16/18), thought vertigo was coming back, got a little dizzy but laid down and was gone by next morning. Did not take any medications to relieve symptoms.  Past Medical History:  Diagnosis Date  . Abnormal uterine bleeding   . Anxiety    postpartum and hx panic attacks  . Arrhythmia   . Chest mass    by CT scanning  . Depression   . Gestational diabetes   . Hx of migraines   . Kidney stones   . Personal history of colonic polyps   . Seasonal allergies   . Urinary incontinence 05/2009   hx bladder sling--Dr. Quincy Simmonds    Past Surgical History:  Procedure Laterality Date  . ABDOMINAL HYSTERECTOMY  05/2009   TAH/LSO  . BLADDER SUSPENSION  2010   Dr. Quincy Simmonds  . BREAST BIOPSY     left stereo  . CESAREAN SECTION  03/2003  . CESAREAN SECTION  2004  . COLONOSCOPY  09/08  . COLPORRHAPHY  05/2009   Dr. Quincy Simmonds  . ENDOMETRIAL ABLATION  2006   Dr. Quincy Simmonds  . INCONTINENCE SURGERY     sling for urinary incontinence  . LAPAROSCOPIC ENDOMETRIOSIS FULGURATION     ablation  . OOPHORECTOMY  2010   w/hyst--Dr. Quincy Simmonds  . TUBAL LIGATION  2004   w/ C-section Dr. Quincy Simmonds  . TYMPANOPLASTY     right ear  . VAGINAL HYSTERECTOMY  05/21/2009   lap assisted /partial    Family History  Problem Relation Age of Onset  . Kidney disease Mother        renal failure  . Stroke Mother   . Diabetes Mother   . Cancer - Colon Mother        deceased age 30  . Hypertension Mother   . Heart failure Mother   . Stroke Father   . Cancer Maternal Aunt        colon  . Colon cancer Maternal Aunt   . Hypertension Sister   .  Rheum arthritis Sister   . Hypertension Brother   . Cancer - Colon Paternal Aunt        deceased  . Hypertension Sister   . Hypertension Sister   . Hypertension Sister   . Esophageal cancer Neg Hx   . Rectal cancer Neg Hx   . Stomach cancer Neg Hx   . Breast cancer Neg Hx    Social History   Tobacco Use  . Smoking status: Former Smoker    Types: Cigarettes    Last attempt to quit: 01/06/1999    Years since quitting: 19.9  . Smokeless tobacco: Never Used  Substance Use Topics  . Alcohol use: No    Alcohol/week: 0.0 standard drinks  . Drug use: No     Review of Systems Per HPI    Objective:   Physical Exam Vitals signs reviewed.  Constitutional:      Appearance: Normal appearance.  HENT:     Head: Normocephalic.  Neurological:     Mental Status: She is alert  and oriented to person, place, and time.  Psychiatric:        Attention and Perception: Attention normal.        Mood and Affect: Mood normal.        Cognition and Memory: Cognition normal.     BP (!) 144/86   Pulse 77   Temp (!) 97.5 F (36.4 C)   Ht 5\' 7"  (1.702 m)   Wt 249 lb 8 oz (113.2 kg)   BMI 39.08 kg/m   BP Readings from Last 3 Encounters:  12/19/18 (!) 144/86  12/05/18 (!) 160/84  11/30/18 (!) 161/99    Wt Readings from Last 3 Encounters:  12/19/18 249 lb 8 oz (113.2 kg)  12/05/18 249 lb 8 oz (113.2 kg)  11/30/18 240 lb (108.9 kg)         Assessment & Plan:  Essential hypertension - Plan: Basic Metabolic Panel  Patient Instructions  Good to see you today  Continue to watch your salt intake  Follow up in 3 months for your complete physical

## 2019-01-06 ENCOUNTER — Other Ambulatory Visit: Payer: Self-pay

## 2019-01-06 ENCOUNTER — Ambulatory Visit (INDEPENDENT_AMBULATORY_CARE_PROVIDER_SITE_OTHER): Payer: 59 | Admitting: Family Medicine

## 2019-01-06 ENCOUNTER — Encounter: Payer: Self-pay | Admitting: Family Medicine

## 2019-01-06 DIAGNOSIS — B9789 Other viral agents as the cause of diseases classified elsewhere: Secondary | ICD-10-CM

## 2019-01-06 DIAGNOSIS — J069 Acute upper respiratory infection, unspecified: Secondary | ICD-10-CM

## 2019-01-06 NOTE — Progress Notes (Signed)
Virtual Visit via Telephone Note  I connected with Virginia Oneal on 01/06/19 at  9:00 AM EDT by telephone and verified that I am speaking with the correct person using two identifiers. Patient was at her home. I was at my office.    I discussed the limitations, risks, security and privacy concerns of performing an evaluation and management service by telephone and the availability of in person appointments. I also discussed with the patient that there may be a patient responsible charge related to this service. The patient expressed understanding and agreed to proceed.   History of Present Illness: This is a 53 yo female who reports headache, body aches, nasal congestion x 3 days. Awoke 2 days ago with symptoms, felt a little better yesterday am, then worse in the afternoon. Feels flu like. No fever (has checked daily with thermometer). Dry cough. No nasal drainage, some post nasal drainage. Bilateral ear pain yesterday. Sometimes feels like pressure in chest with cough. No wheeze or SOB. Taking Tylenol Sinus with a little relief. Taking Zyrtec daily. Blood pressures on new home machine running 140-150/80-90.    Observations/Objective: Patient answers questions appropriately. Able to talk in complete sentences. No cough witnessed.   Assessment and Plan: 1. Viral URI with cough - Provided written and verbal information regarding diagnosis and treatment. Sent patient a Pharmacist, community message with summary and recommendations as well as follow up guidelines - symptomatic treatment  Clarene Reamer, FNP-BC  [facility] at [facility], [facility]  01/06/2019 9:55 AM   Follow Up Instructions:    I discussed the assessment and treatment plan with the patient. The patient was provided an opportunity to ask questions and all were answered. The patient agreed with the plan and demonstrated an understanding of the instructions.   The patient was advised to call back or seek an  in-person evaluation if the symptoms worsen or if the condition fails to improve as anticipated.  I provided 9 minutes of non-face-to-face time during this encounter.   Elby Beck, FNP

## 2019-02-08 ENCOUNTER — Ambulatory Visit: Payer: 59 | Admitting: Family Medicine

## 2019-03-29 ENCOUNTER — Other Ambulatory Visit: Payer: Self-pay

## 2019-03-29 ENCOUNTER — Ambulatory Visit
Admission: RE | Admit: 2019-03-29 | Discharge: 2019-03-29 | Disposition: A | Discharge status: Home / Self Care | Payer: 59 | Source: Ambulatory Visit | Attending: Family Medicine | Admitting: Family Medicine

## 2019-03-29 ENCOUNTER — Ambulatory Visit: Payer: 59 | Admitting: Family Medicine

## 2019-03-29 ENCOUNTER — Encounter: Payer: Self-pay | Admitting: Family Medicine

## 2019-03-29 VITALS — BP 158/76 | HR 76 | Temp 97.6°F | Resp 20 | Wt 243.2 lb

## 2019-03-29 DIAGNOSIS — R109 Unspecified abdominal pain: Secondary | ICD-10-CM

## 2019-03-29 DIAGNOSIS — R310 Gross hematuria: Secondary | ICD-10-CM

## 2019-03-29 DIAGNOSIS — N132 Hydronephrosis with renal and ureteral calculous obstruction: Secondary | ICD-10-CM

## 2019-03-29 DIAGNOSIS — N200 Calculus of kidney: Secondary | ICD-10-CM | POA: Diagnosis not present

## 2019-03-29 DIAGNOSIS — R110 Nausea: Secondary | ICD-10-CM | POA: Diagnosis not present

## 2019-03-29 LAB — POCT URINALYSIS DIPSTICK
Bilirubin, UA: POSITIVE
Blood, UA: POSITIVE
Glucose, UA: NEGATIVE
Ketones, UA: POSITIVE
Nitrite, UA: NEGATIVE
Protein, UA: POSITIVE — AB
Spec Grav, UA: >=1.03 — AB (ref 1.010–1.025)
Urobilinogen, UA: 1 E.U./dL
pH, UA: 5 (ref 5.0–8.0)

## 2019-03-29 MED ORDER — ONDANSETRON 4 MG PO TBDP
4.0000 mg | ORAL_TABLET | Freq: Once | ORAL | Status: AC
  Administered 2019-03-29: 4 mg via ORAL

## 2019-03-29 MED ORDER — KETOROLAC TROMETHAMINE 60 MG/2ML IM SOLN
60.0000 mg | Freq: Once | INTRAMUSCULAR | Status: AC
  Administered 2019-03-29: 11:00:00 60 mg via INTRAMUSCULAR

## 2019-03-29 MED ORDER — TAMSULOSIN HCL 0.4 MG PO CAPS: 0.4000 mg | ORAL_CAPSULE | Freq: Every day | ORAL | 0 refills | Status: DC

## 2019-03-29 NOTE — Patient Instructions (Signed)
Good to see you  I will notify you of CT results  You can take tylenol- 2 tablets every 8 to 12 hours  You can take ibuprofen 3-4 tablets every 8 to 12 hours- first dose after 10 pm tonight due to injection of Toradol

## 2019-03-29 NOTE — Addendum Note (Signed)
Addended by: Kris Mouton on: 03/29/2019 02:30 PM   Modules accepted: Orders

## 2019-03-29 NOTE — Progress Notes (Signed)
Subjective:    Patient ID: Virginia Oneal, female    DOB: Mar 26, 1966, 53 y.o.   MRN: [id number]  HPI This is a 53 yo female who presents today with hematuria and right flank pain, started this am when she awoke. Took ibuprofen 400 mg about 3 hours ago without relief. Has distant history of kidney stones. No fever. Pain in right flank, radiates to abdomen. Nausea, no vomiting. Little fluid intake since she awoke due to nausea.   Past Medical History:  Diagnosis Date  . Abnormal uterine bleeding   . Anxiety    postpartum and hx panic attacks  . Arrhythmia   . Chest mass    by CT scanning  . Depression   . Gestational diabetes   . Hx of migraines   . Kidney stones   . Personal history of colonic polyps   . Seasonal allergies   . Urinary incontinence 05/2009   hx bladder sling--Dr. Quincy Simmonds   Past Surgical History:  Procedure Laterality Date  . ABDOMINAL HYSTERECTOMY  05/2009   TAH/LSO  . BLADDER SUSPENSION  2010   Dr. Quincy Simmonds  . BREAST BIOPSY     left stereo  . CESAREAN SECTION  03/2003  . CESAREAN SECTION  2004  . COLONOSCOPY  09/08  . COLPORRHAPHY  05/2009   Dr. Quincy Simmonds  . ENDOMETRIAL ABLATION  2006   Dr. Quincy Simmonds  . INCONTINENCE SURGERY     sling for urinary incontinence  . LAPAROSCOPIC ENDOMETRIOSIS FULGURATION     ablation  . OOPHORECTOMY  2010   w/hyst--Dr. Quincy Simmonds  . TUBAL LIGATION  2004   w/ C-section Dr. Quincy Simmonds  . TYMPANOPLASTY     right ear  . VAGINAL HYSTERECTOMY  05/21/2009   lap assisted /partial   Family History  Problem Relation Age of Onset  . Kidney disease Mother        renal failure  . Stroke Mother   . Diabetes Mother   . Cancer - Colon Mother        deceased age 17  . Hypertension Mother   . Heart failure Mother   . Stroke Father   . Cancer Maternal Aunt        colon  . Colon cancer Maternal Aunt   . Hypertension Sister   . Rheum arthritis Sister   . Hypertension Brother   . Cancer - Colon Paternal Aunt        deceased  . Hypertension Sister    . Hypertension Sister   . Hypertension Sister   . Esophageal cancer Neg Hx   . Rectal cancer Neg Hx   . Stomach cancer Neg Hx   . Breast cancer Neg Hx    Social History   Tobacco Use  . Smoking status: Former Smoker    Types: Cigarettes    Quit date: 01/06/1999    Years since quitting: 20.2  . Smokeless tobacco: Never Used  Substance Use Topics  . Alcohol use: No    Alcohol/week: 0.0 standard drinks  . Drug use: No      Review of Systems Per HPI    Objective:   Physical Exam Vitals signs reviewed.  Constitutional:      Appearance: She is obese. She is ill-appearing. She is not diaphoretic.  HENT:     Head: Normocephalic and atraumatic.  Eyes:     Conjunctiva/sclera: Conjunctivae normal.  Cardiovascular:     Rate and Rhythm: Normal rate and regular rhythm.     Heart sounds:  Normal heart sounds.  Pulmonary:     Effort: Pulmonary effort is normal.     Breath sounds: Normal breath sounds.  Abdominal:     Tenderness: There is abdominal tenderness. There is right CVA tenderness. There is no left CVA tenderness.  Skin:    General: Skin is warm and dry.  Neurological:     Mental Status: She is alert and oriented to person, place, and time.  Psychiatric:        Mood and Affect: Mood normal.        Behavior: Behavior normal.        Thought Content: Thought content normal.        Judgment: Judgment normal.       BP (!) 158/76   Pulse 76   Temp 97.6 F (36.4 C)   Resp 20   Wt 243 lb 4 oz (110.3 kg)   SpO2 97%   BMI 38.10 kg/m  Wt Readings from Last 3 Encounters:  03/29/19 243 lb 4 oz (110.3 kg)  12/19/18 249 lb 8 oz (113.2 kg)  12/05/18 249 lb 8 oz (113.2 kg)   Results for orders placed or performed in visit on 03/29/19  POCT urinalysis dipstick  Result Value Ref Range   Color, UA brown color    Clarity, UA cloudy    Glucose, UA Negative Negative   Bilirubin, UA positive-1+    Ketones, UA positive    Spec Grav, UA >=1.030 (A) 1.010 - 1.025   Blood,  UA 3+-positive    pH, UA 5.0 5.0 - 8.0   Protein, UA Positive (A) Negative   Urobilinogen, UA 1.0 0.2 or 1.0 E.U./dL   Nitrite, UA negative    Leukocytes, UA Trace (A) Negative   Appearance     Odor         Assessment & Plan:  1. Gross hematuria - POCT urinalysis dipstick - CT RENAL STONE STUDY; Future  2. Right flank pain - ketorolac (TORADOL) injection 60 mg - CT RENAL STONE STUDY; Future  3. Nausea - ondansetron (ZOFRAN-ODT) disintegrating tablet 4 mg  4. Right nephrolithiasis - tamsulosin (FLOMAX) 0.4 MG CAPS capsule; Take 1 capsule (0.4 mg total) by mouth daily.  Dispense: 30 capsule; Refill: 0 - Ambulatory referral to Urology  5. Hydronephrosis with urinary obstruction due to ureteral calculus - tamsulosin (FLOMAX) 0.4 MG CAPS capsule; Take 1 capsule (0.4 mg total) by mouth daily.  Dispense: 30 capsule; Refill: 0 - Ambulatory referral to Urology   Clarene Reamer, FNP-BC  [facility]  03/29/2019 1:14 PM

## 2019-03-31 LAB — URINE CULTURE
MICRO NUMBER:: 579286
SPECIMEN QUALITY:: ADEQUATE

## 2019-04-03 ENCOUNTER — Telehealth: Payer: Self-pay

## 2019-04-03 NOTE — Telephone Encounter (Signed)
Noted  

## 2019-04-03 NOTE — Telephone Encounter (Signed)
Spoke w/pt and she doesn't feel she had passed stone.  She has an appt today at 2 pm elsewhere and her daughters have dental appts at 4:15.  Pt states she would be ok if can't be seen until tomorrow.  I called [facility] and spoke w/triage nurse.  They will see where they can fit her in and call her.

## 2019-04-03 NOTE — Telephone Encounter (Signed)
Pt left v/m was seen on 03/29/19 and urine culture done. Now pt having burning and pain upon urination and voiding smaller amts. Pt wants to know what to do. Can you ck on urology referral and if cannot get pt seen today please forward note to Glenda Chroman FNP. Urology referral is in pts chart.pt dx rt nephrolithiasis and hydronephrosis with urinary obstruction due to ureteral calculus.Please advise. Pt request cb.

## 2019-04-10 ENCOUNTER — Ambulatory Visit (INDEPENDENT_AMBULATORY_CARE_PROVIDER_SITE_OTHER): Payer: 59 | Admitting: Family Medicine

## 2019-04-10 ENCOUNTER — Other Ambulatory Visit: Payer: Self-pay

## 2019-04-10 ENCOUNTER — Encounter: Payer: Self-pay | Admitting: Family Medicine

## 2019-04-10 VITALS — BP 138/78 | HR 84 | Temp 98.2°F | Ht 64.5 in | Wt 242.0 lb

## 2019-04-10 DIAGNOSIS — Z0000 Encounter for general adult medical examination without abnormal findings: Secondary | ICD-10-CM

## 2019-04-10 DIAGNOSIS — I10 Essential (primary) hypertension: Secondary | ICD-10-CM | POA: Diagnosis not present

## 2019-04-10 DIAGNOSIS — Z6841 Body Mass Index (BMI) 40.0 and over, adult: Secondary | ICD-10-CM | POA: Diagnosis not present

## 2019-04-10 DIAGNOSIS — L309 Dermatitis, unspecified: Secondary | ICD-10-CM | POA: Diagnosis not present

## 2019-04-10 DIAGNOSIS — F411 Generalized anxiety disorder: Secondary | ICD-10-CM | POA: Diagnosis not present

## 2019-04-10 DIAGNOSIS — Z23 Encounter for immunization: Secondary | ICD-10-CM

## 2019-04-10 LAB — LIPID PANEL
Cholesterol: 196 mg/dL (ref 0–200)
HDL: 57.1 mg/dL (ref >39.00)
LDL Cholesterol: 118 mg/dL — ABNORMAL HIGH (ref 0–99)
NonHDL: 138.77
Total CHOL/HDL Ratio: 3
Triglycerides: 102 mg/dL (ref 0.0–149.0)
VLDL: 20.4 mg/dL (ref 0.0–40.0)

## 2019-04-10 LAB — HEMOGLOBIN A1C: Hgb A1c MFr Bld: 5.8 % (ref 4.6–6.5)

## 2019-04-10 MED ORDER — LORAZEPAM 0.5 MG PO TABS: 0.5000 mg | ORAL_TABLET | Freq: Two times a day (BID) | ORAL | 1 refills | Status: DC | PRN

## 2019-04-10 MED ORDER — BETAMETHASONE DIPROPIONATE 0.05 % EX CREA: TOPICAL_CREAM | Freq: Two times a day (BID) | CUTANEOUS | 0 refills | Status: DC

## 2019-04-10 NOTE — Progress Notes (Signed)
Subjective:    Patient ID: Virginia Oneal, female    DOB: [birth year], 53 y.o.   MRN: [id number]  HPI This is a 53 yo female who presents today for CPE.   Last CPE- last year Mammo- 04/13/18 Pap- N/A- vaginal hysterectomy Colonoscopy- 06/03/2018 Tdap- 09/26/08, will have today Flu- annual Eye- regular Dental- regular Exercise- has fallen off of walking, plans to resume Sleep- poor, she has been having more difficulty sleeping with pandemic  Recent kidney stone, had urology appointment, has some hydronephrosis, has follow up scheduled. No current pain or hematuria.   Weight loss- difficulty losing weight. Has not been making healthy food choices recently with increased stress.   Anxiety with panic attacks- more frequent lately with daughter getting ready to go to college and pandemic concerns. She is taking lorazepam  several times a week with good relief  Eczema- occasional flares on hand, requests refill of steroid cream.   Past Medical History:  Diagnosis Date  . Abnormal uterine bleeding   . Anxiety    postpartum and hx panic attacks  . Arrhythmia   . Chest mass    by CT scanning  . Depression   . Gestational diabetes   . Hx of migraines   . Kidney stones   . Personal history of colonic polyps   . Seasonal allergies   . Urinary incontinence 05/2009   hx bladder sling--Dr. Quincy Simmonds   Past Surgical History:  Procedure Laterality Date  . ABDOMINAL HYSTERECTOMY  05/2009   TAH/LSO  . BLADDER SUSPENSION  2010   Dr. Quincy Simmonds  . BREAST BIOPSY     left stereo  . CESAREAN SECTION  03/2003  . CESAREAN SECTION  2004  . COLONOSCOPY  09/08  . COLPORRHAPHY  05/2009   Dr. Quincy Simmonds  . ENDOMETRIAL ABLATION  2006   Dr. Quincy Simmonds  . INCONTINENCE SURGERY     sling for urinary incontinence  . LAPAROSCOPIC ENDOMETRIOSIS FULGURATION     ablation  . OOPHORECTOMY  2010   w/hyst--Dr. Quincy Simmonds  . TUBAL LIGATION  2004   w/ C-section Dr. Quincy Simmonds  . TYMPANOPLASTY     right ear  . VAGINAL HYSTERECTOMY   05/21/2009   lap assisted /partial   Family History  Problem Relation Age of Onset  . Kidney disease Mother        renal failure  . Stroke Mother   . Diabetes Mother   . Cancer - Colon Mother        deceased age 70  . Hypertension Mother   . Heart failure Mother   . Stroke Father   . Cancer Maternal Aunt        colon  . Colon cancer Maternal Aunt   . Hypertension Sister   . Rheum arthritis Sister   . Hypertension Brother   . Cancer - Colon Paternal Aunt        deceased  . Hypertension Sister   . Hypertension Sister   . Hypertension Sister   . Esophageal cancer Neg Hx   . Rectal cancer Neg Hx   . Stomach cancer Neg Hx   . Breast cancer Neg Hx    Social History   Tobacco Use  . Smoking status: Former Smoker    Types: Cigarettes    Quit date: 01/06/1999    Years since quitting: 20.2  . Smokeless tobacco: Never Used  Substance Use Topics  . Alcohol use: No    Alcohol/week: 0.0 standard drinks  . Drug use: No  Review of Systems  Constitutional: Positive for fatigue (with poor sleep). Negative for unexpected weight change.  HENT: Negative.   Eyes: Negative.   Respiratory: Negative.   Cardiovascular: Negative.   Gastrointestinal: Negative.   Endocrine: Negative.   Genitourinary: Negative.   Musculoskeletal: Negative.   Skin: Negative.   Allergic/Immunologic: Negative.   Neurological: Negative.   Psychiatric/Behavioral: Positive for sleep disturbance. Negative for dysphoric mood. The patient is nervous/anxious (about daughter going to college and pandemic).        Objective:   Physical Exam Physical Exam  Constitutional: She is oriented to person, place, and time. She appears well-developed and well-nourished. No distress.  HENT:  Head: Normocephalic and atraumatic.  Right Ear: External ear normal.  Left Ear: External ear normal.  Nose: Nose normal.  Mouth/Throat: Oropharynx is clear and moist. No oropharyngeal exudate.  Eyes: Conjunctivae are  normal. Pupils are equal, round, and reactive to light.  Neck: Normal range of motion. Neck supple. No JVD present. No thyromegaly present.  Cardiovascular: Normal rate, regular rhythm, normal heart sounds and intact distal pulses.   Pulmonary/Chest: Effort normal and breath sounds normal. Right breast exhibits no inverted nipple, no mass, no nipple discharge, no skin change and no tenderness. Left breast exhibits no inverted nipple, no mass, no nipple discharge, no skin change and no tenderness. Breasts are symmetrical.  Abdominal: Soft. Bowel sounds are normal. She exhibits no distension and no mass. There is no tenderness. There is no rebound and no guarding.  Musculoskeletal: Normal range of motion. She exhibits no edema or tenderness.  Lymphadenopathy:    She has no cervical adenopathy.  Neurological: She is alert and oriented to person, place, and time. She has normal reflexes.  Skin: Skin is warm and dry. She is not diaphoretic.  Psychiatric: She has a normal mood and affect. Her behavior is normal. Judgment and thought content normal.  Vitals reviewed.  BP (!) 148/96   Pulse 84   Temp 98.2 F (36.8 C) (Oral)   Ht 5' 4.5" (1.638 m)   Wt 242 lb (109.8 kg)   SpO2 98%   BMI 40.90 kg/m  Wt Readings from Last 3 Encounters:  04/10/19 242 lb (109.8 kg)  03/29/19 243 lb 4 oz (110.3 kg)  12/19/18 249 lb 8 oz (113.2 kg)  BP recheck- BP: 138/78  Depression screen PHQ 2/9 04/10/2019  Decreased Interest 0  Down, Depressed, Hopeless 0  PHQ - 2 Score 0  Altered sleeping 0  Tired, decreased energy 0  Change in appetite 0  Feeling bad or failure about yourself  0  Trouble concentrating 0  Moving slowly or fidgety/restless 0  Suicidal thoughts 0  PHQ-9 Score 0  Difficult doing work/chores Not difficult at all       Assessment & Plan:  1. Annual physical exam - Discussed and encouraged healthy lifestyle choices- adequate sleep, regular exercise, stress management and healthy food  choices.   2. Essential hypertension - well controlled on current medication - Lipid Panel - Hemoglobin A1c  3. Eczema, unspecified type - betamethasone dipropionate (DIPROLENE) 0.05 % cream; Apply topically 2 (two) times daily. For no more than 10 days.  Dispense: 45 g; Refill: 0  4. Generalized anxiety disorder - discussed taking daily medication to improve anxiety and minimize need for lorazepam. Patient not interested at this time. Reviewed potential side effects of benzodiazepines including tolerance, dependence, somnolence, mood disturbance. Encouraged her to not use more than a couple of times a week. Also discussed  non medication treatments for anxiety including healthy food choices, meditation/yoga, regular walking - LORazepam (ATIVAN) 0.5 MG tablet; Take 1 tablet (0.5 mg total) by mouth every 12 (twelve) hours as needed for anxiety.  Dispense: 30 tablet; Refill: 1  5. Class 3 severe obesity due to excess calories without serious comorbidity with body mass index (BMI) of 40.0 to 44.9 in adult [facility]) - Lipid Panel - Hemoglobin A1c - encouraged her to prioritize protein and fiber, minimizing sugar sweetened beverages, processed and packaged foods.   6. Need for Tdap vaccination - Tdap vaccine greater than or equal to 7yo IM  - follow up in 3 months  Clarene Reamer, FNP-BC  [facility], [place]  04/11/2019 8:28 PM

## 2019-04-10 NOTE — Patient Instructions (Addendum)
Good to see you today  Please follow up in 6 months   Food plan- protein 20 gms every meal x 4 a day, veggies, water, walk every day   Health Maintenance, Female Adopting a healthy lifestyle and getting preventive care are important in promoting health and wellness. Ask your health care provider about:  The right schedule for you to have regular tests and exams.  Things you can do on your own to prevent diseases and keep yourself healthy. What should I know about diet, weight, and exercise? Eat a healthy diet   Eat a diet that includes plenty of vegetables, fruits, low-fat dairy products, and lean protein.  Do not eat a lot of foods that are high in solid fats, added sugars, or sodium. Maintain a healthy weight Body mass index (BMI) is used to identify weight problems. It estimates body fat based on height and weight. Your health care provider can help determine your BMI and help you achieve or maintain a healthy weight. Get regular exercise Get regular exercise. This is one of the most important things you can do for your health. Most adults should:  Exercise for at least 150 minutes each week. The exercise should increase your heart rate and make you sweat (moderate-intensity exercise).  Do strengthening exercises at least twice a week. This is in addition to the moderate-intensity exercise.  Spend less time sitting. Even light physical activity can be beneficial. Watch cholesterol and blood lipids Have your blood tested for lipids and cholesterol at 55 years of age, then have this test every 5 years. Have your cholesterol levels checked more often if:  Your lipid or cholesterol levels are high.  You are older than 55 years of age.  You are at high risk for heart disease. What should I know about cancer screening? Depending on your health history and family history, you may need to have cancer screening at various ages. This may include screening for:  Breast  cancer.  Cervical cancer.  Colorectal cancer.  Skin cancer.  Lung cancer. What should I know about heart disease, diabetes, and high blood pressure? Blood pressure and heart disease  High blood pressure causes heart disease and increases the risk of stroke. This is more likely to develop in people who have high blood pressure readings, are of African descent, or are overweight.  Have your blood pressure checked: ? Every 3-5 years if you are 79-7 years of age. ? Every year if you are 51 years old or older. Diabetes Have regular diabetes screenings. This checks your fasting blood sugar level. Have the screening done:  Once every three years after age 40 if you are at a normal weight and have a low risk for diabetes.  More often and at a younger age if you are overweight or have a high risk for diabetes. What should I know about preventing infection? Hepatitis B If you have a higher risk for hepatitis B, you should be screened for this virus. Talk with your health care provider to find out if you are at risk for hepatitis B infection. Hepatitis C Testing is recommended for:  Everyone born from [birth year] through 1967.  Anyone with known risk factors for hepatitis C. Sexually transmitted infections (STIs)  Get screened for STIs, including gonorrhea and chlamydia, if: ? You are sexually active and are younger than 55 years of age. ? You are older than 55 years of age and your health care provider tells you that you are at risk  for this type of infection. ? Your sexual activity has changed since you were last screened, and you are at increased risk for chlamydia or gonorrhea. Ask your health care provider if you are at risk.  Ask your health care provider about whether you are at high risk for HIV. Your health care provider may recommend a prescription medicine to help prevent HIV infection. If you choose to take medicine to prevent HIV, you should first get tested for HIV. You should  then be tested every 3 months for as long as you are taking the medicine. Pregnancy  If you are about to stop having your period (premenopausal) and you may become pregnant, seek counseling before you get pregnant.  Take 400 to 800 micrograms (mcg) of folic acid every day if you become pregnant.  Ask for birth control (contraception) if you want to prevent pregnancy. Osteoporosis and menopause Osteoporosis is a disease in which the bones lose minerals and strength with aging. This can result in bone fractures. If you are 59 years old or older, or if you are at risk for osteoporosis and fractures, ask your health care provider if you should:  Be screened for bone loss.  Take a calcium or vitamin D supplement to lower your risk of fractures.  Be given hormone replacement therapy (HRT) to treat symptoms of menopause. Follow these instructions at home: Lifestyle  Do not use any products that contain nicotine or tobacco, such as cigarettes, e-cigarettes, and chewing tobacco. If you need help quitting, ask your health care provider.  Do not use street drugs.  Do not share needles.  Ask your health care provider for help if you need support or information about quitting drugs. Alcohol use  Do not drink alcohol if: ? Your health care provider tells you not to drink. ? You are pregnant, may be pregnant, or are planning to become pregnant.  If you drink alcohol: ? Limit how much you use to 0-1 drink a day. ? Limit intake if you are breastfeeding.  Be aware of how much alcohol is in your drink. In the [place] one drink equals one 12 oz bottle of beer (355 mL), one 5 oz glass of wine (148 mL), or one 1 oz glass of hard liquor (44 mL). General instructions  Schedule regular health, dental, and eye exams.  Stay current with your vaccines.  Tell your health care provider if: ? You often feel depressed. ? You have ever been abused or do not feel safe at home. Summary  Adopting a  healthy lifestyle and getting preventive care are important in promoting health and wellness.  Follow your health care provider's instructions about healthy diet, exercising, and getting tested or screened for diseases.  Follow your health care provider's instructions on monitoring your cholesterol and blood pressure. This information is not intended to replace advice given to you by your health care provider. Make sure you discuss any questions you have with your health care provider. Document Released: 04/13/2011 Document Revised: 09/21/2018 Document Reviewed: 09/21/2018 Elsevier Patient Education  2020 [employer].

## 2019-04-21 ENCOUNTER — Other Ambulatory Visit: Payer: Self-pay | Admitting: Family Medicine

## 2019-04-21 DIAGNOSIS — N200 Calculus of kidney: Secondary | ICD-10-CM

## 2019-04-21 DIAGNOSIS — N132 Hydronephrosis with renal and ureteral calculous obstruction: Secondary | ICD-10-CM

## 2019-04-21 NOTE — Telephone Encounter (Signed)
Spoke with patient in regards to Flomax refill. Patient states she just took it for 30 days and was told to stop. Removing this medication from her list and letting pharmacy know.

## 2019-06-15 ENCOUNTER — Telehealth: Payer: Self-pay

## 2019-06-15 ENCOUNTER — Other Ambulatory Visit: Payer: Self-pay

## 2019-06-15 ENCOUNTER — Ambulatory Visit: Payer: 59 | Admitting: Family Medicine

## 2019-06-15 ENCOUNTER — Encounter: Payer: Self-pay | Admitting: Family Medicine

## 2019-06-15 VITALS — BP 158/90 | HR 94 | Temp 98.2°F | Ht 64.5 in | Wt 245.8 lb

## 2019-06-15 DIAGNOSIS — Z23 Encounter for immunization: Secondary | ICD-10-CM

## 2019-06-15 DIAGNOSIS — M545 Low back pain, unspecified: Secondary | ICD-10-CM | POA: Insufficient documentation

## 2019-06-15 MED ORDER — CYCLOBENZAPRINE HCL 10 MG PO TABS: 10.0000 mg | ORAL_TABLET | Freq: Every evening | ORAL | 0 refills | Status: DC | PRN

## 2019-06-15 MED ORDER — DICLOFENAC SODIUM 75 MG PO TBEC: 75.0000 mg | DELAYED_RELEASE_TABLET | Freq: Two times a day (BID) | ORAL | 0 refills | Status: DC

## 2019-06-15 NOTE — Assessment & Plan Note (Signed)
No red flags. No sign of sciatica. No indicaiton for X-ray. NSAIDs, muscle relaxant, home PT and heat.  Return if not better in 2 weeks.

## 2019-06-15 NOTE — Telephone Encounter (Signed)
2 wks ago pt started beginners yoga; last yoga done on 05/29/19. Pt has severe lower back pain on both sides; at times pain radiates into rt hip. Pt has no known injury. Pt hurts all the time but pain is worse when moves especially when trying to get up from seated or lying position. No UTI complaints. Pt alternating tylenol, ibuprofen and using capsicin patches with minimal relief.pain level now is 7. Pt scheduled in office visit with Dr Diona Browner today at 3:20. ED precautions given and pt voiced understanding. Pt has no covid symptoms, no travel and no known exposure to + covid.

## 2019-06-15 NOTE — Patient Instructions (Addendum)
Stop ibuprofen and tylenol.  Start diclofenac twice daily.  Can use muscle relaxant at night.  Start heat on low back. Start home physical therapy.  Follow up if not improving in 2 weeks.

## 2019-06-15 NOTE — Telephone Encounter (Signed)
Agree with plan 

## 2019-06-15 NOTE — Progress Notes (Signed)
Chief Complaint  Patient presents with  . Back Pain    x 2 weeks-hurt doing Yoga    History of Present Illness: HPI   53 year old female patient of Tor Netters presents with new onset bilateral low back pain following new start beginners Yoga. Woke up day later in AM. Bilateral low back 7/10 pain scale   No radiation of pain.  Ongoing almost 2 weeks. Worse with moving from seated or lying to standing. Pain with walking or lying down.  Trouble sleeping.  Pt alternating tylenol, ibuprofen 400mg  and using capsicin patches with minimal relief  No dysuria, no CVA tenderness, no fever.  No new numbness, no weakness, no new neuro changes  No history of back issues, no past back surgeries.  COVID 19 screen No recent travel or known exposure to COVID19 The patient denies respiratory symptoms of COVID 19 at this time.  The importance of social distancing was discussed today.   Review of Systems  Constitutional: Negative for chills and fever.  HENT: Negative for congestion and ear pain.   Eyes: Negative for pain and redness.  Respiratory: Negative for cough and shortness of breath.   Cardiovascular: Negative for chest pain, palpitations and leg swelling.  Gastrointestinal: Negative for abdominal pain, blood in stool, constipation, diarrhea, nausea and vomiting.  Genitourinary: Negative for dysuria.  Musculoskeletal: Negative for falls and myalgias.  Skin: Negative for rash.  Neurological: Negative for dizziness.  Psychiatric/Behavioral: Negative for depression. The patient is not nervous/anxious.       Past Medical History:  Diagnosis Date  . Abnormal uterine bleeding   . Anxiety    postpartum and hx panic attacks  . Arrhythmia   . Chest mass    by CT scanning  . Depression   . Gestational diabetes   . Hx of migraines   . Kidney stones   . Personal history of colonic polyps   . Seasonal allergies   . Urinary incontinence 05/2009   hx bladder sling--Dr. Quincy Simmonds    reports that she quit smoking about 20 years ago. Her smoking use included cigarettes. She has never used smokeless tobacco. She reports that she does not drink alcohol or use drugs.   Current Outpatient Medications:  .  betamethasone dipropionate (DIPROLENE) 0.05 % cream, Apply topically 2 (two) times daily. For no more than 10 days., Disp: 45 g, Rfl: 0 .  cetirizine (ZYRTEC ALLERGY) 10 MG tablet, Take 10 mg by mouth daily. , Disp: , Rfl:  .  Cyanocobalamin (B-12) 100 MCG TABS, Take 1 tablet by mouth daily. , Disp: , Rfl:  .  hydrochlorothiazide (HYDRODIURIL) 25 MG tablet, Take 1 tablet (25 mg total) by mouth daily., Disp: 90 tablet, Rfl: 3 .  LORazepam (ATIVAN) 0.5 MG tablet, Take 1 tablet (0.5 mg total) by mouth every 12 (twelve) hours as needed for anxiety., Disp: 30 tablet, Rfl: 1   Observations/Objective: Blood pressure (!) 158/90, pulse 94, temperature 98.2 F (36.8 C), temperature source Temporal, height 5' 4.5" (1.638 m), weight 245 lb 12 oz (111.5 kg), SpO2 94 %.  Physical Exam Constitutional:      General: She is not in acute distress.    Appearance: Normal appearance. She is well-developed. She is not ill-appearing or toxic-appearing.  HENT:     Head: Normocephalic.     Right Ear: Hearing, tympanic membrane, ear canal and external ear normal. Tympanic membrane is not erythematous, retracted or bulging.     Left Ear: Hearing, tympanic membrane, ear canal  and external ear normal. Tympanic membrane is not erythematous, retracted or bulging.     Nose: No mucosal edema or rhinorrhea.     Right Sinus: No maxillary sinus tenderness or frontal sinus tenderness.     Left Sinus: No maxillary sinus tenderness or frontal sinus tenderness.     Mouth/Throat:     Pharynx: Uvula midline.  Eyes:     General: Lids are normal. Lids are everted, no foreign bodies appreciated.     Conjunctiva/sclera: Conjunctivae normal.     Pupils: Pupils are equal, round, and reactive to light.  Neck:      Musculoskeletal: Normal range of motion and neck supple.     Thyroid: No thyroid mass or thyromegaly.     Vascular: No carotid bruit.     Trachea: Trachea normal.  Cardiovascular:     Rate and Rhythm: Normal rate and regular rhythm.     Pulses: Normal pulses.     Heart sounds: Normal heart sounds, S1 normal and S2 normal. No murmur. No friction rub. No gallop.   Pulmonary:     Effort: Pulmonary effort is normal. No tachypnea or respiratory distress.     Breath sounds: Normal breath sounds. No decreased breath sounds, wheezing, rhonchi or rales.  Abdominal:     General: Bowel sounds are normal.     Palpations: Abdomen is soft.     Tenderness: There is no abdominal tenderness.  Musculoskeletal:     Lumbar back: She exhibits decreased range of motion and tenderness. She exhibits no bony tenderness and no swelling.     Comments: ttp bilateral paraspinous muscles Neg SLR, neg faber's  Skin:    General: Skin is warm and dry.     Findings: No rash.  Neurological:     Mental Status: She is alert.  Psychiatric:        Mood and Affect: Mood is not anxious or depressed.        Speech: Speech normal.        Behavior: Behavior normal. Behavior is cooperative.        Thought Content: Thought content normal.        Judgment: Judgment normal.      Assessment and Plan  Acute bilateral low back pain without sciatica No red flags. No sign of sciatica. No indicaiton for X-ray. NSAIDs, muscle relaxant, home PT and heat.  Return if not better in 2 weeks.    Eliezer Lofts, MD

## 2019-07-11 ENCOUNTER — Other Ambulatory Visit: Payer: Self-pay

## 2019-07-11 ENCOUNTER — Encounter: Payer: Self-pay | Admitting: Family Medicine

## 2019-07-11 ENCOUNTER — Ambulatory Visit (INDEPENDENT_AMBULATORY_CARE_PROVIDER_SITE_OTHER)
Admission: RE | Admit: 2019-07-11 | Discharge: 2019-07-11 | Disposition: A | Discharge status: Home / Self Care | Payer: 59 | Source: Ambulatory Visit | Attending: Family Medicine | Admitting: Family Medicine

## 2019-07-11 ENCOUNTER — Ambulatory Visit: Payer: 59 | Admitting: Family Medicine

## 2019-07-11 VITALS — BP 140/80 | HR 91 | Temp 98.1°F | Ht 64.5 in | Wt 247.2 lb

## 2019-07-11 DIAGNOSIS — M545 Low back pain, unspecified: Secondary | ICD-10-CM

## 2019-07-11 LAB — POCT URINALYSIS DIPSTICK
Bilirubin, UA: NEGATIVE
Blood, UA: NEGATIVE
Glucose, UA: NEGATIVE
Ketones, UA: NEGATIVE
Leukocytes, UA: NEGATIVE
Nitrite, UA: NEGATIVE
Protein, UA: NEGATIVE
Spec Grav, UA: 1.02 (ref 1.010–1.025)
Urobilinogen, UA: 0.2 E.U./dL
pH, UA: 6 (ref 5.0–8.0)

## 2019-07-11 MED ORDER — PREDNISONE 20 MG PO TABS: ORAL_TABLET | ORAL | 0 refills | Status: DC

## 2019-07-11 MED ORDER — TRAMADOL HCL 50 MG PO TABS: 25.0000 mg | ORAL_TABLET | Freq: Three times a day (TID) | ORAL | 0 refills | Status: AC | PRN

## 2019-07-11 NOTE — Addendum Note (Signed)
Addended by: Kris Mouton on: 07/11/2019 11:42 AM   Modules accepted: Orders

## 2019-07-11 NOTE — Patient Instructions (Signed)
Start prednisone taper.  Can use tramadol for breakthrough pain.  We will call with X-ray results.,  Continue heat and home stretching.

## 2019-07-11 NOTE — Progress Notes (Signed)
Chief Complaint  Patient presents with  . Back Pain    History of Present Illness: HPI   53 year old female patient of Tor Netters presents with continued low back pain.   She was seen on 06/15/2019 with similar pain. Treated with diclofenac, muscle relaxant, home PT and heat.   She reports pain improved but then came back in last week. Constant.  She has had more severe pain in left low back, occ on right side. No radiation of pain. No numbness, no weakness in lags.  Nothing makes it worse or better.   Able to sleep at night.  No dysuria  Does have history of kidney stones.  No history of back problems and surgery  No known falls/injuries. COVID 19 screen No recent travel or known exposure to COVID19 The patient denies respiratory symptoms of COVID 19 at this time.  The importance of social distancing was discussed today.   Review of Systems  Constitutional: Negative for chills and fever.  HENT: Negative for congestion and ear pain.   Eyes: Negative for pain and redness.  Respiratory: Negative for cough and shortness of breath.   Cardiovascular: Negative for chest pain, palpitations and leg swelling.  Gastrointestinal: Negative for abdominal pain, blood in stool, constipation, diarrhea, nausea and vomiting.  Genitourinary: Negative for dysuria.  Musculoskeletal: Negative for falls and myalgias.  Skin: Negative for rash.  Neurological: Negative for dizziness.  Psychiatric/Behavioral: Negative for depression. The patient is not nervous/anxious.       Past Medical History:  Diagnosis Date  . Abnormal uterine bleeding   . Anxiety    postpartum and hx panic attacks  . Arrhythmia   . Chest mass    by CT scanning  . Depression   . Gestational diabetes   . Hx of migraines   . Kidney stones   . Personal history of colonic polyps   . Seasonal allergies   . Urinary incontinence 05/2009   hx bladder sling--Dr. Quincy Simmonds    reports that she quit smoking about 20 years  ago. Her smoking use included cigarettes. She has never used smokeless tobacco. She reports that she does not drink alcohol or use drugs.   Current Outpatient Medications:  .  betamethasone dipropionate (DIPROLENE) 0.05 % cream, Apply topically 2 (two) times daily. For no more than 10 days., Disp: 45 g, Rfl: 0 .  cetirizine (ZYRTEC ALLERGY) 10 MG tablet, Take 10 mg by mouth daily. , Disp: , Rfl:  .  Cyanocobalamin (B-12) 100 MCG TABS, Take 1 tablet by mouth daily. , Disp: , Rfl:  .  hydrochlorothiazide (HYDRODIURIL) 25 MG tablet, Take 1 tablet (25 mg total) by mouth daily., Disp: 90 tablet, Rfl: 3 .  LORazepam (ATIVAN) 0.5 MG tablet, Take 1 tablet (0.5 mg total) by mouth every 12 (twelve) hours as needed for anxiety., Disp: 30 tablet, Rfl: 1 .  cyclobenzaprine (FLEXERIL) 10 MG tablet, Take 1 tablet (10 mg total) by mouth at bedtime as needed for muscle spasms. (Patient not taking: Reported on 07/11/2019), Disp: 15 tablet, Rfl: 0 .  diclofenac (VOLTAREN) 75 MG EC tablet, Take 1 tablet (75 mg total) by mouth 2 (two) times daily. (Patient not taking: Reported on 07/11/2019), Disp: 30 tablet, Rfl: 0   Observations/Objective: Blood pressure 140/80, pulse 91, temperature 98.1 F (36.7 C), temperature source Temporal, height 5' 4.5" (1.638 m), weight 247 lb 4 oz (112.2 kg), SpO2 94 %.  Physical Exam Constitutional:      General: She is not  in acute distress.    Appearance: Normal appearance. She is well-developed. She is not ill-appearing or toxic-appearing.  HENT:     Head: Normocephalic.     Right Ear: Hearing, tympanic membrane, ear canal and external ear normal. Tympanic membrane is not erythematous, retracted or bulging.     Left Ear: Hearing, tympanic membrane, ear canal and external ear normal. Tympanic membrane is not erythematous, retracted or bulging.     Nose: No mucosal edema or rhinorrhea.     Right Sinus: No maxillary sinus tenderness or frontal sinus tenderness.     Left Sinus: No  maxillary sinus tenderness or frontal sinus tenderness.     Mouth/Throat:     Pharynx: Uvula midline.  Eyes:     General: Lids are normal. Lids are everted, no foreign bodies appreciated.     Conjunctiva/sclera: Conjunctivae normal.     Pupils: Pupils are equal, round, and reactive to light.  Neck:     Musculoskeletal: Normal range of motion and neck supple.     Thyroid: No thyroid mass or thyromegaly.     Vascular: No carotid bruit.     Trachea: Trachea normal.  Cardiovascular:     Rate and Rhythm: Normal rate and regular rhythm.     Pulses: Normal pulses.     Heart sounds: Normal heart sounds, S1 normal and S2 normal. No murmur. No friction rub. No gallop.   Pulmonary:     Effort: Pulmonary effort is normal. No tachypnea or respiratory distress.     Breath sounds: Normal breath sounds. No decreased breath sounds, wheezing, rhonchi or rales.  Abdominal:     General: Bowel sounds are normal.     Palpations: Abdomen is soft.     Tenderness: There is no abdominal tenderness.  Musculoskeletal:     Thoracic back: She exhibits tenderness. She exhibits normal range of motion and no bony tenderness.     Lumbar back: She exhibits decreased range of motion, tenderness and bony tenderness.     Comments: Neg SLR, neg faber's   ttp over left CVA  Skin:    General: Skin is warm and dry.     Findings: No rash.  Neurological:     Mental Status: She is alert.     Cranial Nerves: Cranial nerves are intact.     Sensory: Sensation is intact.     Motor: Motor function is intact.     Coordination: Coordination is intact.     Deep Tendon Reflexes: Reflexes are normal and symmetric.  Psychiatric:        Mood and Affect: Mood is not anxious or depressed.        Speech: Speech normal.        Behavior: Behavior normal. Behavior is cooperative.        Thought Content: Thought content normal.        Judgment: Judgment normal.      Assessment and Plan      Eliezer Lofts, MD

## 2019-07-14 ENCOUNTER — Other Ambulatory Visit: Payer: Self-pay | Admitting: Family Medicine

## 2019-07-14 DIAGNOSIS — Z1231 Encounter for screening mammogram for malignant neoplasm of breast: Secondary | ICD-10-CM

## 2019-07-17 ENCOUNTER — Ambulatory Visit
Admission: RE | Admit: 2019-07-17 | Discharge: 2019-07-17 | Disposition: A | Discharge status: Home / Self Care | Payer: 59 | Source: Ambulatory Visit | Attending: Family Medicine | Admitting: Family Medicine

## 2019-07-17 ENCOUNTER — Other Ambulatory Visit: Payer: Self-pay

## 2019-07-17 DIAGNOSIS — Z1231 Encounter for screening mammogram for malignant neoplasm of breast: Secondary | ICD-10-CM

## 2019-07-20 ENCOUNTER — Telehealth: Payer: Self-pay | Admitting: Family Medicine

## 2019-07-20 DIAGNOSIS — M545 Low back pain, unspecified: Secondary | ICD-10-CM

## 2019-07-20 NOTE — Telephone Encounter (Signed)
Referral sent 

## 2019-07-20 NOTE — Telephone Encounter (Signed)
Given changes on X-ray showing degenerative changes/arthritis.. I would recommend seeing a back PMR doctor for possible injection.  Let me know if she is agreeable , preferences?   IF not interested in this instead we could refer to PT.

## 2019-07-20 NOTE — Telephone Encounter (Signed)
Virginia Oneal notified as instructed by telephone.  She is agreeable to referral PMR for possible injection.

## 2019-07-20 NOTE — Telephone Encounter (Signed)
Best number [phone]  Pt saw dr Diona Browner 9/29 and 9/3 for back pain.  She stated she is still having back pain and wants to know what she needs to do next

## 2019-07-24 ENCOUNTER — Ambulatory Visit (INDEPENDENT_AMBULATORY_CARE_PROVIDER_SITE_OTHER): Payer: 59 | Admitting: Family Medicine

## 2019-07-24 ENCOUNTER — Encounter: Payer: Self-pay | Admitting: Family Medicine

## 2019-07-24 ENCOUNTER — Other Ambulatory Visit: Payer: Self-pay

## 2019-07-24 ENCOUNTER — Ambulatory Visit (INDEPENDENT_AMBULATORY_CARE_PROVIDER_SITE_OTHER)
Admission: RE | Admit: 2019-07-24 | Discharge: 2019-07-24 | Disposition: A | Discharge status: Home / Self Care | Payer: 59 | Source: Ambulatory Visit | Attending: Family Medicine | Admitting: Family Medicine

## 2019-07-24 VITALS — BP 144/82 | HR 103 | Temp 97.2°F | Ht 64.5 in | Wt 241.4 lb

## 2019-07-24 DIAGNOSIS — Z87442 Personal history of urinary calculi: Secondary | ICD-10-CM

## 2019-07-24 DIAGNOSIS — R1032 Left lower quadrant pain: Secondary | ICD-10-CM

## 2019-07-24 DIAGNOSIS — M545 Low back pain, unspecified: Secondary | ICD-10-CM

## 2019-07-24 LAB — CBC WITH DIFFERENTIAL/PLATELET
Basophils Absolute: 0 10*3/uL (ref 0.0–0.1)
Basophils Relative: 0.3 % (ref 0.0–3.0)
Eosinophils Absolute: 0.1 10*3/uL (ref 0.0–0.7)
Eosinophils Relative: 1.9 % (ref 0.0–5.0)
HCT: 44.5 % (ref 36.0–46.0)
Hemoglobin: 14.9 g/dL (ref 12.0–15.0)
Lymphocytes Relative: 29.4 % (ref 12.0–46.0)
Lymphs Abs: 2.2 10*3/uL (ref 0.7–4.0)
MCHC: 33.5 g/dL (ref 30.0–36.0)
MCV: 90.3 fl (ref 78.0–100.0)
Monocytes Absolute: 0.5 10*3/uL (ref 0.1–1.0)
Monocytes Relative: 6.2 % (ref 3.0–12.0)
Neutro Abs: 4.6 10*3/uL (ref 1.4–7.7)
Neutrophils Relative %: 62.2 % (ref 43.0–77.0)
Platelets: 167 10*3/uL (ref 150.0–400.0)
RBC: 4.93 Mil/uL (ref 3.87–5.11)
RDW: 13.2 % (ref 11.5–15.5)
WBC: 7.3 10*3/uL (ref 4.0–10.5)

## 2019-07-24 LAB — COMPREHENSIVE METABOLIC PANEL
ALT: 36 U/L — ABNORMAL HIGH (ref 0–35)
AST: 23 U/L (ref 0–37)
Albumin: 4.4 g/dL (ref 3.5–5.2)
Alkaline Phosphatase: 96 U/L (ref 39–117)
BUN: 17 mg/dL (ref 6–23)
CO2: 32 mEq/L (ref 19–32)
Calcium: 10.2 mg/dL (ref 8.4–10.5)
Chloride: 100 mEq/L (ref 96–112)
Creatinine, Ser: 0.85 mg/dL (ref 0.40–1.20)
GFR: 69.39 mL/min (ref >60.00)
Glucose, Bld: 127 mg/dL — ABNORMAL HIGH (ref 70–99)
Potassium: 3.8 mEq/L (ref 3.5–5.1)
Sodium: 140 mEq/L (ref 135–145)
Total Bilirubin: 0.5 mg/dL (ref 0.2–1.2)
Total Protein: 7.6 g/dL (ref 6.0–8.3)

## 2019-07-24 LAB — POC URINALSYSI DIPSTICK (AUTOMATED)
Bilirubin, UA: NEGATIVE
Blood, UA: NEGATIVE
Glucose, UA: NEGATIVE
Ketones, UA: NEGATIVE
Leukocytes, UA: NEGATIVE
Nitrite, UA: NEGATIVE
Protein, UA: NEGATIVE
Spec Grav, UA: 1.02 (ref 1.010–1.025)
Urobilinogen, UA: 0.2 E.U./dL
pH, UA: 6 (ref 5.0–8.0)

## 2019-07-24 MED ORDER — KETOROLAC TROMETHAMINE 60 MG/2ML IM SOLN
60.0000 mg | Freq: Once | INTRAMUSCULAR | Status: AC
  Administered 2019-07-24: 60 mg via INTRAMUSCULAR

## 2019-07-24 NOTE — Progress Notes (Signed)
Subjective:    Patient ID: Virginia Oneal, female    DOB: 04-28-1966, 53 y.o.   MRN: [id number]  HPI Chief Complaint  Patient presents with   Back Pain    Pt having increased back/hip pain. Pt has not heard from referral placed last week. Pressure feelin gon left side, denies numbness/tingling   This is a 53 yo female who presents today for above cc. Was seen by Dr. Diona Browner on 06/15/2019 and again on 07/11/19 with increased low back and hip pain. She was given prednisone.  Xray revealed mild scoliosis and diffuse degenerative change. Referral was placed to PT. She had about 2 days of relief on prednisone and then pain returned. No significant improvement with Tramadol. Over last 4-5 days, has noticed increased pain on left side, radiating into abdomen. Has had only small BMs last several days. Has taken 2 laxatives x 2 days with minimal relief. Pain constant, worse with some movements. Decreased appetite, feels full, bloated. Difficulty sleeping. No bowel or bladder incontinence. No weakness or falls. No fevers.   Past Medical History:  Diagnosis Date   Abnormal uterine bleeding    Anxiety    postpartum and hx panic attacks   Arrhythmia    Chest mass    by CT scanning   Depression    Gestational diabetes    Hx of migraines    Kidney stones    Personal history of colonic polyps    Seasonal allergies    Urinary incontinence 05/2009   hx bladder sling--Dr. Quincy Simmonds   Past Surgical History:  Procedure Laterality Date   ABDOMINAL HYSTERECTOMY  05/2009   TAH/LSO   BLADDER SUSPENSION  2010   Dr. Quincy Simmonds   BREAST BIOPSY     left stereo   CESAREAN SECTION  03/2003   CESAREAN SECTION  2004   COLONOSCOPY  09/08   COLPORRHAPHY  05/2009   Dr. Quincy Simmonds   ENDOMETRIAL ABLATION  2006   Dr. Quincy Simmonds   INCONTINENCE SURGERY     sling for urinary incontinence   LAPAROSCOPIC ENDOMETRIOSIS FULGURATION     ablation   OOPHORECTOMY  2010   w/hyst--Dr. Quincy Simmonds   TUBAL LIGATION  2004     w/ C-section Dr. Quincy Simmonds   TYMPANOPLASTY     right ear   VAGINAL HYSTERECTOMY  05/21/2009   lap assisted /partial   Family History  Problem Relation Age of Onset   Kidney disease Mother        renal failure   Stroke Mother    Diabetes Mother    Cancer - Colon Mother        deceased age 38   Hypertension Mother    Heart failure Mother    Stroke Father    Cancer Maternal Aunt        colon   Colon cancer Maternal Aunt    Hypertension Sister    Rheum arthritis Sister    Hypertension Brother    Cancer - Colon Paternal Aunt        deceased   Hypertension Sister    Hypertension Sister    Hypertension Sister    Esophageal cancer Neg Hx    Rectal cancer Neg Hx    Stomach cancer Neg Hx    Breast cancer Neg Hx    Social History   Tobacco Use   Smoking status: Former Smoker    Types: Cigarettes    Quit date: 01/06/1999    Years since quitting: 20.5   Smokeless tobacco:  Never Used  Substance Use Topics   Alcohol use: No    Alcohol/week: 0.0 standard drinks   Drug use: No    Review of Systems    per HPI Objective:   Physical Exam Vitals signs reviewed.  Constitutional:      General: She is not in acute distress.    Appearance: She is obese. She is ill-appearing and diaphoretic. She is not toxic-appearing.  HENT:     Head: Normocephalic and atraumatic.  Eyes:     Conjunctiva/sclera: Conjunctivae normal.  Cardiovascular:     Rate and Rhythm: Normal rate.  Pulmonary:     Effort: Pulmonary effort is normal.  Abdominal:     General: Abdomen is flat. There is no distension.     Palpations: Abdomen is soft. There is no mass.     Tenderness: There is abdominal tenderness (LLQ). There is left CVA tenderness. There is no right CVA tenderness, guarding or rebound.     Hernia: No hernia is present.  Musculoskeletal:     Comments: Slightly decreased ROM with twisting to right. Negative straight leg raise. Normal gait. Tender over left, lower back.  No point tenderness.   Neurological:     Mental Status: She is alert and oriented to person, place, and time.  Psychiatric:        Mood and Affect: Mood normal.        Behavior: Behavior normal.        Thought Content: Thought content normal.        Judgment: Judgment normal.       BP (!) 144/82 (BP Location: Left Arm, Patient Position: Sitting, Cuff Size: Normal)    Pulse (!) 103    Temp (!) 97.2 F (36.2 C) (Temporal)    Ht 5' 4.5" (1.638 m)    Wt 241 lb 6.4 oz (109.5 kg)    SpO2 96%    BMI 40.80 kg/m  Wt Readings from Last 3 Encounters:  07/24/19 241 lb 6.4 oz (109.5 kg)  07/11/19 247 lb 4 oz (112.2 kg)  06/15/19 245 lb 12 oz (111.5 kg)   BP Readings from Last 3 Encounters:  07/24/19 (!) 144/82  07/11/19 140/80  06/15/19 (!) 158/90       Results for orders placed or performed in visit on 07/24/19  POCT Urinalysis Dipstick (Automated)  Result Value Ref Range   Color, UA light yellow    Clarity, UA clear    Glucose, UA Negative Negative   Bilirubin, UA neg    Ketones, UA neg    Spec Grav, UA 1.020 1.010 - 1.025   Blood, UA neg    pH, UA 6.0 5.0 - 8.0   Protein, UA Negative Negative   Urobilinogen, UA 0.2 0.2 or 1.0 E.U./dL   Nitrite, UA neg    Leukocytes, UA Negative Negative    Assessment & Plan:  1. Left lower quadrant abdominal pain - unclear etiology, suspect separate from low back pain. Will have her take laxatives/Mira Lax for constipation, check KUB, labs/UA.  - ketorolac (TORADOL) injection 60 mg - DG Abd 1 View; Future - Comprehensive metabolic panel - CBC with Differential - POCT Urinalysis Dipstick (Automated)  2. History of kidney stones - POCT Urinalysis Dipstick (Automated)  3. Acute bilateral low back pain without sciatica - will rule out acute worrisome processes. PT referral already placed.    Clarene Reamer, FNP-BC  [facility]  07/24/2019 11:22 AM

## 2019-07-24 NOTE — Patient Instructions (Signed)
Good to see you today  Take 2 laxatives and then 1 hour later two doses of [place] Oneal (generic is fine)  Lab, xray- I will call you with results

## 2019-08-03 ENCOUNTER — Encounter: Payer: Self-pay | Admitting: Family Medicine

## 2019-10-11 ENCOUNTER — Encounter: Payer: Self-pay | Admitting: Family Medicine

## 2019-10-11 ENCOUNTER — Ambulatory Visit: Payer: 59 | Admitting: Family Medicine

## 2019-10-11 ENCOUNTER — Other Ambulatory Visit: Payer: Self-pay | Admitting: Family Medicine

## 2019-10-11 ENCOUNTER — Other Ambulatory Visit: Payer: Self-pay

## 2019-10-11 VITALS — BP 138/92 | HR 76 | Temp 96.9°F | Wt 248.0 lb

## 2019-10-11 DIAGNOSIS — M545 Low back pain, unspecified: Secondary | ICD-10-CM

## 2019-10-11 DIAGNOSIS — I10 Essential (primary) hypertension: Secondary | ICD-10-CM | POA: Diagnosis not present

## 2019-10-11 DIAGNOSIS — F411 Generalized anxiety disorder: Secondary | ICD-10-CM

## 2019-10-11 DIAGNOSIS — Z6841 Body Mass Index (BMI) 40.0 and over, adult: Secondary | ICD-10-CM

## 2019-10-11 DIAGNOSIS — F419 Anxiety disorder, unspecified: Secondary | ICD-10-CM

## 2019-10-11 MED ORDER — HYDROCHLOROTHIAZIDE 25 MG PO TABS: 25.0000 mg | ORAL_TABLET | Freq: Every day | ORAL | 3 refills | Status: DC

## 2019-10-11 NOTE — Progress Notes (Addendum)
   Subjective:    Patient ID: Virginia Oneal, female    DOB: December 06, 1965, 53 y.o.   MRN: [id number]  HPI Chief Complaint  Patient presents with  . Follow-up    no new concerns   This is a 53 yo female who presents today for follow up of HTN, back pain, leg swelling, obesity, anxiety.   HTN- tolerating medication  Back pain- had injection by Dr. Nelva Bush, good results. Lumbar films show osteoarthritis. Has some stiffness/ pain in hands with repetitive motions.   Leg swelling- resolved with HCTZ 25 mg  Obesity- has not been walking with cold weather, time change.   Anxiety- rare use of alprazolam. Has been doing better with her daughter attending [employer] from home. Had increased anxiety related to her daughter's unhappiness.      Review of Systems Denies chest pain, SOB/DOE    Objective:   Physical Exam Constitutional:      General: She is not in acute distress.    Appearance: Normal appearance. She is obese. She is not ill-appearing, toxic-appearing or diaphoretic.  HENT:     Head: Normocephalic and atraumatic.  Eyes:     Conjunctiva/sclera: Conjunctivae normal.  Cardiovascular:     Rate and Rhythm: Normal rate.  Pulmonary:     Effort: Pulmonary effort is normal.  Neurological:     Mental Status: She is alert and oriented to person, place, and time.  Psychiatric:        Mood and Affect: Mood normal.        Behavior: Behavior normal.        Thought Content: Thought content normal.        Judgment: Judgment normal.       BP (!) 138/92 (BP Location: Left Arm, Patient Position: Sitting, Cuff Size: Normal) Comment: Pt did not take BP meds this morning.  Pulse 76   Temp (!) 96.9 F (36.1 C) (Temporal)   Wt 248 lb (112.5 kg)   SpO2 98%   BMI 41.91 kg/m  Wt Readings from Last 3 Encounters:  10/11/19 248 lb (112.5 kg)  07/24/19 241 lb 6.4 oz (109.5 kg)  07/11/19 247 lb 4 oz (112.2 kg)       Assessment & Plan:  1. Essential hypertension - continue current med,  discussed weight loss - hydrochlorothiazide (HYDRODIURIL) 25 MG tablet; Take 1 tablet (25 mg total) by mouth daily.  Dispense: 90 tablet; Refill: 3  2. Acute bilateral low back pain without sciatica - resolved with injections, follow up with ortho prn - she does have osteoarthritis and discussed benefits of weight loss, anti-inflammatory diet  3. Anxiety - has been doing better, rare alprazolam use  4. Class 3 severe obesity due to excess calories without serious comorbidity with body mass index (BMI) of 40.0 to 44.9 in adult [facility] - encouraged her to decrease fast foods, processed foods, white flour/sugar. Increase protein, good fats, vegs, fruits, complex carbs  - follow up in 6 months for annual exam  This visit occurred during the SARS-CoV-2 public health emergency.  Safety protocols were in place, including screening questions prior to the visit, additional usage of staff PPE, and extensive cleaning of exam room while observing appropriate contact time as indicated for disinfecting solutions.    Clarene Reamer, FNP-BC  [facility] [place] [facility]  10/11/2019 9:28 AM

## 2019-10-11 NOTE — Telephone Encounter (Signed)
Last filled on 04/10/2019 330 with 1 refill. LOV today-10/11/2019 for follow up  No future appointments scheduled

## 2019-10-11 NOTE — Patient Instructions (Addendum)
  Podcast- Nogood news (one word)  Look at [email or web address]- intermittent fasting  Low inflammation diet- high in quality protein, good fats (olive oil, nuts- not peanuts, avocado oil, coconut oil, grass fed butter/ghee), vegetables, fruits. Starches like brown rice, quinoa, potatoes, old fashioned oat meal.  Avoid flour, sugar, fast and processed food.

## 2019-10-16 ENCOUNTER — Encounter: Payer: Self-pay | Admitting: Family Medicine

## 2019-10-16 ENCOUNTER — Other Ambulatory Visit: Payer: Self-pay

## 2019-10-16 ENCOUNTER — Ambulatory Visit: Payer: 59 | Admitting: Family Medicine

## 2019-10-16 ENCOUNTER — Ambulatory Visit (INDEPENDENT_AMBULATORY_CARE_PROVIDER_SITE_OTHER)
Admission: RE | Admit: 2019-10-16 | Discharge: 2019-10-16 | Disposition: A | Discharge status: Home / Self Care | Payer: 59 | Source: Ambulatory Visit | Attending: Family Medicine | Admitting: Family Medicine

## 2019-10-16 VITALS — BP 164/82 | HR 94 | Temp 97.6°F | Ht 64.5 in | Wt 244.8 lb

## 2019-10-16 DIAGNOSIS — Z87442 Personal history of urinary calculi: Secondary | ICD-10-CM

## 2019-10-16 DIAGNOSIS — R109 Unspecified abdominal pain: Secondary | ICD-10-CM

## 2019-10-16 DIAGNOSIS — R350 Frequency of micturition: Secondary | ICD-10-CM

## 2019-10-16 LAB — POCT URINALYSIS DIPSTICK
Bilirubin, UA: NEGATIVE
Blood, UA: NEGATIVE
Glucose, UA: NEGATIVE
Ketones, UA: NEGATIVE
Leukocytes, UA: NEGATIVE
Nitrite, UA: NEGATIVE
Protein, UA: NEGATIVE
Spec Grav, UA: 1.025 (ref 1.010–1.025)
Urobilinogen, UA: 0.2 E.U./dL
pH, UA: 6 (ref 5.0–8.0)

## 2019-10-16 MED ORDER — KETOROLAC TROMETHAMINE 60 MG/2ML IM SOLN
60.0000 mg | Freq: Once | INTRAMUSCULAR | Status: AC
  Administered 2019-10-16: 60 mg via INTRAMUSCULAR

## 2019-10-16 NOTE — Patient Instructions (Addendum)
For pain, can take 2-3 ibuprofen every 8-12 hours and can alternate with 2 extra strength Tylenol

## 2019-10-16 NOTE — Progress Notes (Signed)
Subjective:    Patient ID: Virginia Oneal, female    DOB: [birth year], 54 y.o.   MRN: [id number]  HPI Chief Complaint  Patient presents with  . Urinary Frequency    Pt c/o left lower flank pain and urinary frequency. Denies fever or hematuria. Decreased output. Urgency.    This is a 54 yo female who presents today with above cc. Symptoms started about 4 days ago is intermittent, mild pain in left flank.  Over the last 48 hours, symptoms have worsened with more constant aching pain on the left side.  She is taken ibuprofen 400 mg with little relief.  She has a history of kidney stones on the right but in the past has had some left-sided flank pain associated with this.  She has not had any dysuria or hematuria.  This pain is more constant than her previous kidney stone pain but it does not feel like her typical lower back pain.  Bowel movements have been normal.  She has had some nausea related to pain.  She has had a banana today and has had very little to drink.  Past Medical History:  Diagnosis Date  . Abnormal uterine bleeding   . Anxiety    postpartum and hx panic attacks  . Arrhythmia   . Chest mass    by CT scanning  . Depression   . Gestational diabetes   . Hx of migraines   . Kidney stones   . Personal history of colonic polyps   . Seasonal allergies   . Urinary incontinence 05/2009   hx bladder sling--Dr. Quincy Simmonds   Past Surgical History:  Procedure Laterality Date  . ABDOMINAL HYSTERECTOMY  05/2009   TAH/LSO  . BLADDER SUSPENSION  2010   Dr. Quincy Simmonds  . BREAST BIOPSY     left stereo  . CESAREAN SECTION  03/2003  . CESAREAN SECTION  2004  . COLONOSCOPY  09/08  . COLPORRHAPHY  05/2009   Dr. Quincy Simmonds  . ENDOMETRIAL ABLATION  2006   Dr. Quincy Simmonds  . INCONTINENCE SURGERY     sling for urinary incontinence  . LAPAROSCOPIC ENDOMETRIOSIS FULGURATION     ablation  . OOPHORECTOMY  2010   w/hyst--Dr. Quincy Simmonds  . TUBAL LIGATION  2004   w/ C-section Dr. Quincy Simmonds  . TYMPANOPLASTY     right ear  . VAGINAL HYSTERECTOMY  05/21/2009   lap assisted /partial   Family History  Problem Relation Age of Onset  . Kidney disease Mother        renal failure  . Stroke Mother   . Diabetes Mother   . Cancer - Colon Mother        deceased age 8  . Hypertension Mother   . Heart failure Mother   . Stroke Father   . Cancer Maternal Aunt        colon  . Colon cancer Maternal Aunt   . Hypertension Sister   . Rheum arthritis Sister   . Hypertension Brother   . Cancer - Colon Paternal Aunt        deceased  . Hypertension Sister   . Hypertension Sister   . Hypertension Sister   . Esophageal cancer Neg Hx   . Rectal cancer Neg Hx   . Stomach cancer Neg Hx   . Breast cancer Neg Hx    Social History   Tobacco Use  . Smoking status: Former Smoker    Types: Cigarettes    Quit date: 01/06/1999  Years since quitting: 20.7  . Smokeless tobacco: Never Used  Substance Use Topics  . Alcohol use: No    Alcohol/week: 0.0 standard drinks  . Drug use: No      Review of Systems Per HPI    Objective:   Physical Exam Vitals reviewed.  Constitutional:      General: She is not in acute distress.    Appearance: Normal appearance. She is obese. She is ill-appearing. She is not toxic-appearing or diaphoretic.  HENT:     Head: Normocephalic and atraumatic.  Cardiovascular:     Rate and Rhythm: Normal rate.  Pulmonary:     Effort: Pulmonary effort is normal.  Abdominal:     General: Abdomen is flat. There is no distension.     Palpations: There is no mass.     Tenderness: There is no abdominal tenderness. There is no right CVA tenderness, left CVA tenderness, guarding or rebound.     Hernia: No hernia is present.  Skin:    General: Skin is warm and dry.  Neurological:     Mental Status: She is alert and oriented to person, place, and time.  Psychiatric:        Mood and Affect: Mood normal.        Behavior: Behavior normal.        Thought Content: Thought content normal.         Judgment: Judgment normal.       Blood pressure (!) 164/82, pulse 94, temperature 97.6 F (36.4 C), temperature source Temporal, height 5' 4.5" (1.638 m), weight 244 lb 12.8 oz (111 kg), SpO2 97 %.  Wt Readings from Last 3 Encounters:  10/16/19 244 lb 12.8 oz (111 kg)  10/11/19 248 lb (112.5 kg)  07/24/19 241 lb 6.4 oz (109.5 kg)   BP Readings from Last 3 Encounters:  10/16/19 (!) 164/82  10/11/19 (!) 138/92  07/24/19 (!) 144/82   Results for orders placed or performed in visit on 10/16/19  Urinalysis Dipstick  Result Value Ref Range   Color, UA light yellow    Clarity, UA clear    Glucose, UA Negative Negative   Bilirubin, UA neg    Ketones, UA neg    Spec Grav, UA 1.025 1.010 - 1.025   Blood, UA neg    pH, UA 6.0 5.0 - 8.0   Protein, UA Negative Negative   Urobilinogen, UA 0.2 0.2 or 1.0 E.U./dL   Nitrite, UA neg    Leukocytes, UA Negative Negative   Appearance     Odor         Assessment & Plan:  1. Flank pain -Urinalysis normal.  Not sure if this is related to his stone or back strain.  Will get KUB, treat with NSAIDs. - Urinalysis Dipstick - DG Abd 1 View; Future - ketorolac (TORADOL) injection 60 mg - encouraged increased fluids, she thinks she has ondansetron at home and will let me know if she needs anything for nausea  2. Urinary frequency - Urinalysis Dipstick  3. RENAL CALCULUS, HX OF - DG Abd 1 View; Future - ketorolac (TORADOL) injection 60 mg  This visit occurred during the SARS-CoV-2 public health emergency.  Safety protocols were in place, including screening questions prior to the visit, additional usage of staff PPE, and extensive cleaning of exam room while observing appropriate contact time as indicated for disinfecting solutions.    Clarene Reamer, FNP-BC  [facility]  10/16/2019  3:17 PM

## 2020-04-09 ENCOUNTER — Other Ambulatory Visit: Payer: Self-pay

## 2020-04-10 ENCOUNTER — Other Ambulatory Visit: Payer: Self-pay

## 2020-04-10 ENCOUNTER — Encounter: Payer: Self-pay | Admitting: Family Medicine

## 2020-04-10 ENCOUNTER — Other Ambulatory Visit (HOSPITAL_COMMUNITY)
Admission: RE | Admit: 2020-04-10 | Discharge: 2020-04-10 | Disposition: A | Discharge status: Home / Self Care | Payer: 59 | Source: Ambulatory Visit | Attending: Family Medicine | Admitting: Family Medicine

## 2020-04-10 ENCOUNTER — Ambulatory Visit (INDEPENDENT_AMBULATORY_CARE_PROVIDER_SITE_OTHER): Payer: 59 | Admitting: Family Medicine

## 2020-04-10 VITALS — BP 134/82 | HR 84 | Temp 98.2°F | Ht 65.0 in | Wt 214.8 lb

## 2020-04-10 DIAGNOSIS — L309 Dermatitis, unspecified: Secondary | ICD-10-CM | POA: Diagnosis not present

## 2020-04-10 DIAGNOSIS — R739 Hyperglycemia, unspecified: Secondary | ICD-10-CM

## 2020-04-10 DIAGNOSIS — Z0000 Encounter for general adult medical examination without abnormal findings: Secondary | ICD-10-CM

## 2020-04-10 DIAGNOSIS — Z124 Encounter for screening for malignant neoplasm of cervix: Secondary | ICD-10-CM | POA: Diagnosis present

## 2020-04-10 DIAGNOSIS — Z6835 Body mass index (BMI) 35.0-35.9, adult: Secondary | ICD-10-CM

## 2020-04-10 DIAGNOSIS — E559 Vitamin D deficiency, unspecified: Secondary | ICD-10-CM

## 2020-04-10 DIAGNOSIS — R232 Flushing: Secondary | ICD-10-CM | POA: Diagnosis not present

## 2020-04-10 DIAGNOSIS — E66812 Obesity, class 2: Secondary | ICD-10-CM

## 2020-04-10 DIAGNOSIS — F411 Generalized anxiety disorder: Secondary | ICD-10-CM

## 2020-04-10 DIAGNOSIS — I10 Essential (primary) hypertension: Secondary | ICD-10-CM | POA: Diagnosis not present

## 2020-04-10 LAB — LIPID PANEL
Cholesterol: 184 mg/dL (ref 0–200)
HDL: 48.7 mg/dL (ref >39.00)
LDL Cholesterol: 120 mg/dL — ABNORMAL HIGH (ref 0–99)
NonHDL: 135.33
Total CHOL/HDL Ratio: 4
Triglycerides: 76 mg/dL (ref 0.0–149.0)
VLDL: 15.2 mg/dL (ref 0.0–40.0)

## 2020-04-10 LAB — FOLLICLE STIMULATING HORMONE: FSH: 70.3 m[IU]/mL

## 2020-04-10 LAB — TSH: TSH: 1.67 u[IU]/mL (ref 0.35–4.50)

## 2020-04-10 LAB — HEMOGLOBIN A1C: Hgb A1c MFr Bld: 5.4 % (ref 4.6–6.5)

## 2020-04-10 LAB — VITAMIN D 25 HYDROXY (VIT D DEFICIENCY, FRACTURES): VITD: 59.84 ng/mL (ref 30.00–100.00)

## 2020-04-10 MED ORDER — HYDROCHLOROTHIAZIDE 25 MG PO TABS: 25.0000 mg | ORAL_TABLET | Freq: Every day | ORAL | 3 refills | Status: DC

## 2020-04-10 MED ORDER — LORAZEPAM 0.5 MG PO TABS: 0.5000 mg | ORAL_TABLET | Freq: Two times a day (BID) | ORAL | 1 refills | Status: DC | PRN

## 2020-04-10 MED ORDER — BETAMETHASONE VALERATE 0.1 % EX OINT: 1.0000 "application " | TOPICAL_OINTMENT | Freq: Two times a day (BID) | CUTANEOUS | 1 refills | Status: DC

## 2020-04-10 NOTE — Addendum Note (Signed)
Addended by: Clarene Reamer B on: 04/10/2020 10:44 AM   Modules accepted: Orders

## 2020-04-10 NOTE — Progress Notes (Signed)
Subjective:    Patient ID: Virginia Oneal, female    DOB: 31-Dec-1965, 54 y.o.   MRN: [id number]  HPI Chief Complaint  Patient presents with  . Annual Exam    No concerns   This is a 54 yo female who presents today for annual exam.  She has been doing well.  Last CPE- 04/10/19 Mammo- 07/17/2019 Pap-unsure, she has a history of abdominal hysterectomy with 1 ovary removed.  She is not sure if she still has a cervix.  We will do Pap today Colonoscopy-06/03/2018 Tdap-04/10/2019 Flu- annual Eye-annual Dental-annual Exercise- walking every day with family Diet- has increased vegetables, decreased sweets.  Has lost 30 pounds in the last 6 months.  Vasomotor symptoms-has hot flashes daily.  Has noticed increased since she lost weight.  She does not begin to taking medication and has not tried anything for her symptoms and is not interested in hormones or SSRI-SNRI.  Review of Systems  Constitutional: Positive for diaphoresis (hot flashes).  HENT: Negative.   Eyes: Negative.   Respiratory: Negative.   Cardiovascular: Negative.   Gastrointestinal: Negative.   Endocrine: Negative.   Genitourinary: Negative.   Musculoskeletal: Negative.   Skin:       Dermatology annually, has appointment in September.  Neurological: Negative.   Hematological: Negative.   Psychiatric/Behavioral: Positive for sleep disturbance (hot flashes). The patient is nervous/anxious (worse at beginning of year after her sister died. Uses lorazapam sparingly. Will have sudden anxiety attack out of blue iwth no known trigger. ).        Objective:   Physical Exam Physical Exam  Constitutional: She is oriented to person, place, and time. She appears well-developed and well-nourished. No distress.  HENT:  Head: Normocephalic and atraumatic.  Right Ear: External ear normal.  Left Ear: External ear normal.  Nose: Nose normal.  Mouth/Throat: Oropharynx is clear and moist. No oropharyngeal exudate.  Eyes:  Conjunctivae are normal. Pupils are equal, round, and reactive to light.  Neck: Normal range of motion. Neck supple. No JVD present. No thyromegaly present.  Cardiovascular: Normal rate, regular rhythm, normal heart sounds and intact distal pulses.   Pulmonary/Chest: Effort normal and breath sounds normal. Right breast exhibits no inverted nipple, no mass, no nipple discharge, no skin change and no tenderness. Left breast exhibits no inverted nipple, no mass, no nipple discharge, no skin change and no tenderness. Breasts are symmetrical.  Abdominal: Soft. Bowel sounds are normal. She exhibits no distension and no mass. There is no tenderness. There is no rebound and no guarding.  Genitourinary: Vagina normal. Pelvic exam was performed with patient supine. There is no rash, tenderness, lesion or injury on the right labia. There is no rash, tenderness, lesion or injury on the left labia. Fold at end of vaginal canal, ? Cervix, pap obtained. Scant white discharge.  Musculoskeletal: Normal range of motion. She exhibits no edema or tenderness.  Lymphadenopathy:    She has no cervical adenopathy.  Neurological: She is alert and oriented to person, place, and time.  Skin: Skin is warm and dry. She is not diaphoretic. scattered areas peeling, erythema on fingers, palms of hands Psychiatric: She has a normal mood and affect. Her behavior is normal. Judgment and thought content normal.  Vitals reviewed.     BP 134/82 (BP Location: Left Arm, Patient Position: Sitting, Cuff Size: Normal)   Pulse 84   Temp 98.2 F (36.8 C) (Temporal)   Ht 5\' 5"  (1.651 m)   Wt 214 lb  12.8 oz (97.4 kg)   SpO2 97%   BMI 35.74 kg/m  Wt Readings from Last 3 Encounters:  04/10/20 214 lb 12.8 oz (97.4 kg)  10/16/19 244 lb 12.8 oz (111 kg)  10/11/19 248 lb (112.5 kg)       Assessment & Plan:  1. Annual physical exam - Discussed and encouraged healthy lifestyle choices- adequate sleep, regular exercise, stress  management and healthy food choices.   2. Generalized anxiety disorder - doing well with rare lorazepam use - LORazepam (ATIVAN) 0.5 MG tablet; Take 1 tablet (0.5 mg total) by mouth every 12 (twelve) hours as needed for anxiety.  Dispense: 30 tablet; Refill: 1  3. Essential hypertension - well controlled with current med - hydrochlorothiazide (HYDRODIURIL) 25 MG tablet; Take 1 tablet (25 mg total) by mouth daily.  Dispense: 90 tablet; Refill: 3 - Lipid Panel - TSH  4. Eczema, unspecified type - betamethasone valerate ointment (VALISONE) 0.1 %; Apply 1 application topically 2 (two) times daily.  Dispense: 45 g; Refill: 1  5. Screening for cervical cancer - Cytology - PAP(Gravity)  6. Elevated blood sugar - Lipid Panel - Hemoglobin A1c  7. Vitamin D deficiency - Vitamin D, 25-hydroxy  8. Class 2 severe obesity due to excess calories with serious comorbidity and body mass index (BMI) of 35.0 to 35.9 in adult [facility]) - congratulated patient on 30 pound weight loss and encouraged continued exercise, healthy food choices  - follow up in 6 months  This visit occurred during the SARS-CoV-2 public health emergency.  Safety protocols were in place, including screening questions prior to the visit, additional usage of staff PPE, and extensive cleaning of exam room while observing appropriate contact time as indicated for disinfecting solutions.      Clarene Reamer, FNP-BC  [facility] at [facility]  04/10/2020 10:10 AM

## 2020-04-10 NOTE — Patient Instructions (Signed)
Look into Estroven or black cohash, let me know if you would like to discuss medication. Look into pro- estrogen foods.   Menopause Menopause is the normal time of life when menstrual periods stop completely. It is usually confirmed by 12 months without a menstrual period. The transition to menopause (perimenopause) most often happens between the ages of 56 and 69. During perimenopause, hormone levels change in your body, which can cause symptoms and affect your health. Menopause may increase your risk for:  Loss of bone (osteoporosis), which causes bone breaks (fractures).  Depression.  Hardening and narrowing of the arteries (atherosclerosis), which can cause heart attacks and strokes. What are the causes? This condition is usually caused by a natural change in hormone levels that happens as you get older. The condition may also be caused by surgery to remove both ovaries (bilateral oophorectomy). What increases the risk? This condition is more likely to start at an earlier age if you have certain medical conditions or treatments, including:  A tumor of the pituitary gland in the brain.  A disease that affects the ovaries and hormone production.  Radiation treatment for cancer.  Certain cancer treatments, such as chemotherapy or hormone (anti-estrogen) therapy.  Heavy smoking and excessive alcohol use.  Family history of early menopause. This condition is also more likely to develop earlier in women who are very thin. What are the signs or symptoms? Symptoms of this condition include:  Hot flashes.  Irregular menstrual periods.  Night sweats.  Changes in feelings about sex. This could be a decrease in sex drive or an increased comfort around your sexuality.  Vaginal dryness and thinning of the vaginal walls. This may cause painful intercourse.  Dryness of the skin and development of wrinkles.  Headaches.  Problems sleeping (insomnia).  Mood swings or  irritability.  Memory problems.  Weight gain.  Hair growth on the face and chest.  Bladder infections or problems with urinating. How is this diagnosed? This condition is diagnosed based on your medical history, a physical exam, your age, your menstrual history, and your symptoms. Hormone tests may also be done. How is this treated? In some cases, no treatment is needed. You and your health care provider should make a decision together about whether treatment is necessary. Treatment will be based on your individual condition and preferences. Treatment for this condition focuses on managing symptoms. Treatment may include:  Menopausal hormone therapy (MHT).  Medicines to treat specific symptoms or complications.  Acupuncture.  Vitamin or herbal supplements. Before starting treatment, make sure to let your health care provider know if you have a personal or family history of:  Heart disease.  Breast cancer.  Blood clots.  Diabetes.  Osteoporosis. Follow these instructions at home: Lifestyle  Do not use any products that contain nicotine or tobacco, such as cigarettes and e-cigarettes. If you need help quitting, ask your health care provider.  Get at least 30 minutes of physical activity on 5 or more days each week.  Avoid alcoholic and caffeinated beverages, as well as spicy foods. This may help prevent hot flashes.  Get 7-8 hours of sleep each night.  If you have hot flashes, try: ? Dressing in layers. ? Avoiding things that may trigger hot flashes, such as spicy food, warm places, or stress. ? Taking slow, deep breaths when a hot flash starts. ? Keeping a fan in your home and office.  Find ways to manage stress, such as deep breathing, meditation, or journaling.  Consider going  to group therapy with other women who are having menopause symptoms. Ask your health care provider about recommended group therapy meetings. Eating and drinking  Eat a healthy, balanced  diet that contains whole grains, lean protein, low-fat dairy, and plenty of fruits and vegetables.  Your health care provider may recommend adding more soy to your diet. Foods that contain soy include tofu, tempeh, and soy milk.  Eat plenty of foods that contain calcium and vitamin D for bone health. Items that are rich in calcium include low-fat milk, yogurt, beans, almonds, sardines, broccoli, and kale. Medicines  Take over-the-counter and prescription medicines only as told by your health care provider.  Talk with your health care provider before starting any herbal supplements. If prescribed, take vitamins and supplements as told by your health care provider. These may include: ? Calcium. Women age 56 and older should get 1,200 mg (milligrams) of calcium every day. ? Vitamin D. Women need 600-800 International Units of vitamin D each day. ? Vitamins B12 and B6. Aim for 50 micrograms of B12 and 1.5 mg of B6 each day. General instructions  Keep track of your menstrual periods, including: ? When they occur. ? How heavy they are and how long they last. ? How much time passes between periods.  Keep track of your symptoms, noting when they start, how often you have them, and how long they last.  Use vaginal lubricants or moisturizers to help with vaginal dryness and improve comfort during sex.  Keep all follow-up visits as told by your health care provider. This is important. This includes any group therapy or counseling. Contact a health care provider if:  You are still having menstrual periods after age 85.  You have pain during sex.  You have not had a period for 12 months and you develop vaginal bleeding. Get help right away if:  You have: ? Severe depression. ? Excessive vaginal bleeding. ? Pain when you urinate. ? A fast or irregular heart beat (palpitations). ? Severe headaches. ? Abdomen (abdominal) pain or severe indigestion.  You fell and you think you have a broken  bone.  You develop leg or chest pain.  You develop vision problems.  You feel a lump in your breast. Summary  Menopause is the normal time of life when menstrual periods stop completely. It is usually confirmed by 12 months without a menstrual period.  The transition to menopause (perimenopause) most often happens between the ages of 57 and 33.  Symptoms can be managed through medicines, lifestyle changes, and complementary therapies such as acupuncture.  Eat a balanced diet that is rich in nutrients to promote bone health and heart health and to manage symptoms during menopause. This information is not intended to replace advice given to you by your health care provider. Make sure you discuss any questions you have with your health care provider. Document Revised: 09/10/2017 Document Reviewed: 10/31/2016 Elsevier Patient Education  2020 [employer].

## 2020-04-12 LAB — CYTOLOGY - PAP
Adequacy: ABSENT
Comment: NEGATIVE
Diagnosis: NEGATIVE
High risk HPV: NEGATIVE

## 2020-07-26 ENCOUNTER — Other Ambulatory Visit: Payer: Self-pay | Admitting: Family Medicine

## 2020-07-26 ENCOUNTER — Ambulatory Visit
Admission: RE | Admit: 2020-07-26 | Discharge: 2020-07-26 | Disposition: A | Discharge status: Home / Self Care | Payer: 59 | Source: Ambulatory Visit

## 2020-07-26 ENCOUNTER — Other Ambulatory Visit: Payer: Self-pay

## 2020-07-26 DIAGNOSIS — Z1231 Encounter for screening mammogram for malignant neoplasm of breast: Secondary | ICD-10-CM

## 2020-09-30 ENCOUNTER — Encounter: Payer: Self-pay | Admitting: Family Medicine

## 2020-10-12 DIAGNOSIS — U071 COVID-19: Secondary | ICD-10-CM

## 2020-10-12 HISTORY — DX: COVID-19: U07.1

## 2020-10-16 ENCOUNTER — Ambulatory Visit: Payer: 59

## 2020-10-16 ENCOUNTER — Other Ambulatory Visit: Payer: Self-pay

## 2020-10-16 ENCOUNTER — Encounter: Payer: Self-pay | Admitting: Family Medicine

## 2020-10-16 ENCOUNTER — Ambulatory Visit (INDEPENDENT_AMBULATORY_CARE_PROVIDER_SITE_OTHER): Payer: 59

## 2020-10-16 DIAGNOSIS — Z23 Encounter for immunization: Secondary | ICD-10-CM | POA: Diagnosis not present

## 2020-10-16 NOTE — Progress Notes (Signed)
Per orders of Dr. Sharen Hones in the absence of Deboraha Sprang, NP, injection of Flu Vaccine, given by Selina Cooley, RN. Patient tolerated injection well in L Deltoid.

## 2020-11-08 ENCOUNTER — Other Ambulatory Visit: Payer: Self-pay

## 2020-11-08 ENCOUNTER — Other Ambulatory Visit: Payer: 59

## 2020-11-08 DIAGNOSIS — Z20822 Contact with and (suspected) exposure to covid-19: Secondary | ICD-10-CM

## 2020-11-10 LAB — SARS-COV-2, NAA 2 DAY TAT

## 2020-11-10 LAB — NOVEL CORONAVIRUS, NAA: SARS-CoV-2, NAA: DETECTED — AB

## 2020-11-11 ENCOUNTER — Telehealth: Payer: Self-pay

## 2020-11-11 NOTE — Telephone Encounter (Signed)
Called to discuss with patient about COVID-19 symptoms and the use of one of the available treatments for those with mild to moderate Covid symptoms and at a high risk of hospitalization.  Pt appears to qualify for outpatient treatment due to co-morbid conditions and/or a member of an at-risk group in accordance with the FDA Emergency Use Authorization.    Symptom onset: 11/04/20 Vaccinated: Yes Booster? Yes Immunocompromised? No Qualifiers: Obesity  Pt. Is out of 7 day window for treatment.  Virginia Oneal

## 2020-11-18 ENCOUNTER — Encounter: Payer: Self-pay | Admitting: Emergency Medicine

## 2020-11-18 ENCOUNTER — Telehealth: Payer: Self-pay | Admitting: *Deleted

## 2020-11-18 ENCOUNTER — Other Ambulatory Visit: Payer: Self-pay

## 2020-11-18 ENCOUNTER — Ambulatory Visit
Admission: EM | Admit: 2020-11-18 | Discharge: 2020-11-18 | Disposition: A | Discharge status: Home / Self Care | Payer: 59 | Attending: Family Medicine | Admitting: Family Medicine

## 2020-11-18 DIAGNOSIS — R1013 Epigastric pain: Secondary | ICD-10-CM

## 2020-11-18 LAB — POCT URINALYSIS DIP (MANUAL ENTRY)
Bilirubin, UA: NEGATIVE
Blood, UA: NEGATIVE
Glucose, UA: NEGATIVE mg/dL
Leukocytes, UA: NEGATIVE
Nitrite, UA: NEGATIVE
Protein Ur, POC: NEGATIVE mg/dL
Spec Grav, UA: >=1.03 — AB (ref 1.010–1.025)
Urobilinogen, UA: 0.2 E.U./dL
pH, UA: 5 (ref 5.0–8.0)

## 2020-11-18 MED ORDER — FAMOTIDINE 20 MG PO TABS: 20.0000 mg | ORAL_TABLET | Freq: Two times a day (BID) | ORAL | 0 refills | Status: DC

## 2020-11-18 NOTE — Telephone Encounter (Signed)
Patient called and was transferred to access nurse. Access nurse called back stating that patient needed to be seen today.  No appointments available at the office today. Patient was advised that she should go to an urgent care to be evaluated.  Patient advised the front office that she tested positive for covid 11/08/20 and she could not come into the office with symptoms. Patient was transferred to triage because she had questions about when she can come into the office. Spoke to patient and was advised that she tested positive for covid 11/08/20. Patient stated that she has had some abdominal pain and nausea, but no vomiting. Patient stated that the abdominal pain started she thinks about a month ago before covid. Patient stated that she had an episode of abdominal pain and nausea Friday night that was really bad and another night but can not remember which night. Patient was advised that she should probably go ahead and go to an Urgent Care to be evaluated.  Patient was advised that she should call back when she gets this problem taken care of ad schedule a TOC appointment since Tor Netters NP has left the practice. Patient was given information on the [facility].

## 2020-11-18 NOTE — Telephone Encounter (Signed)
[facility] - Client TELEPHONE ADVICE RECORD Access[facility]urse Patient Name: Virginia Oneal Gender: Female DOB: 12/08/1965 Age: 55 Y 29 M 18 D Return Phone Number: [phone] (Primary), [phone] (Secondary) Address: City/State/Zip: [place] [address] Client [facility] - Client Client Site [place] - Day Physician Tor Netters- NP Contact Type Call Who Is Calling Patient / Member / Family / Caregiver Call Type Triage / Clinical Relationship To Patient Self Return Phone Number [phone] (Primary) Chief Complaint Abdominal Pain Reason for Call Symptomatic / Request for [place] states she is having sharp pain in abdomen pain for 2 weeks . Caller states she has dark urine. Translation No Nurse Assessment Nurse: Zorita Pang, RN, Deborah Date/Time (Eastern Time): 11/18/2020 10:25:32 AM Confirm and document reason for call. If symptomatic, describe symptoms. ---The caller states that she is having sharp pain in her upper abdomen. Gets worse at bedtime. Last week she had blood in her urine. Last meal is usually between 6-7. Has a snack late in the evening of chocolate pudding with peanut butter. Has had nausea. Does the patient have any new or worsening symptoms? ---Yes Will a triage be completed? ---Yes Related visit to physician within the last 2 weeks? ---No Does the PT have any chronic conditions? (i.e. diabetes, asthma, this includes High risk factors for pregnancy, etc.) ---Yes List chronic conditions. ---hypertension Is this a behavioral health or substance abuse call? ---No Guidelines Guideline Title Affirmed Question Affirmed Notes Nurse Date/Time (Eastern Time) Abdominal Pain - Upper [1] MILD-MODERATE pain AND [2] constant AND [3] present > 2 hours Womble, RN, Neoma Laming 11/18/2020 10:28:53 AM Disp. Time Eilene Ghazi Time) Disposition Final User 11/18/2020 10:34:06 AM See  HCP within 4 Hours (or PCP triage) Yes Womble, RN, Neoma Laming PLEASE NOTE: All timestamps contained within this report are represented as [place] Standard Time. CONFIDENTIALTY NOTICE: This fax transmission is intended only for the addressee. It contains information that is legally privileged, confidential or otherwise protected from use or disclosure. If you are not the intended recipient, you are strictly prohibited from reviewing, disclosing, copying using or disseminating any of this information or taking any action in reliance on or regarding this information. If you have received this fax in error, please notify us immediately by telephone so that we can arrange for its return to [name]. Phone: [phone], Toll-Free: [phone], Fax: [phone] Page: 2 of 2 Call Id: [id number] [place] Disagree/Comply Comply Caller Understands Yes PreDisposition Call Doctor Care Advice Given Per Guideline SEE HCP (OR PCP TRIAGE) WITHIN 4 HOURS: * If having pain now, try taking an antacid (e.g., Mylanta, Maalox). * You become worse Comments User: Marquis Buggy, RN Date/Time Eilene Ghazi Time): 11/18/2020 10:42:33 AM The caller states that she is not currently in pain and that the pain is only at night around bedtime. She was given the option of going to UC now or being seen tomorrow as they have no appointments today. This nurse instructed her to call back if the pain begins again or if it occurs tonight that she needs to go to the ED and she verbalized understanding. Referrals Warm transfer to backline

## 2020-11-18 NOTE — Telephone Encounter (Signed)
Agree that she needs evaluation for abdominal pain. If she does not go to urgent care then can someone see her in the office this week for her symptoms? If she was diagnosed with Covid on 11/08/20, then today marks day 10. Someone could see her this week.

## 2020-11-18 NOTE — Discharge Instructions (Addendum)
As we spoke about this issue could be related to your gallbladder, constipation, or acid reflux. I am checking some lab work that will be back in the morning. I would like for you to start Pepcid twice a day as needed. You can start this at bedtime since your symptoms seem to come during the middle of the night.  If your primary care cannot order an ultrasound of the gallbladder I will try too.

## 2020-11-18 NOTE — Telephone Encounter (Signed)
Patient is currently being seen in [place]. Will call and follow-up with patient tomorrow.

## 2020-11-18 NOTE — ED Triage Notes (Signed)
Pt c/o abdominal paid mid that wakes her from sleep (last episode Friday). +nausea. Denies chest pain or SOB.

## 2020-11-19 ENCOUNTER — Telehealth: Payer: Self-pay | Admitting: Family Medicine

## 2020-11-19 LAB — COMPREHENSIVE METABOLIC PANEL
ALT: 756 IU/L (ref 0–32)
AST: 324 IU/L — ABNORMAL HIGH (ref 0–40)
Albumin/Globulin Ratio: 2 (ref 1.2–2.2)
Albumin: 4.4 g/dL (ref 3.8–4.9)
Alkaline Phosphatase: 285 IU/L — ABNORMAL HIGH (ref 44–121)
BUN/Creatinine Ratio: 26 — ABNORMAL HIGH (ref 9–23)
BUN: 15 mg/dL (ref 6–24)
Bilirubin Total: 0.7 mg/dL (ref 0.0–1.2)
CO2: 22 mmol/L (ref 20–29)
Calcium: 9.3 mg/dL (ref 8.7–10.2)
Chloride: 105 mmol/L (ref 96–106)
Creatinine, Ser: 0.57 mg/dL (ref 0.57–1.00)
GFR calc Af Amer: 120 mL/min/{1.73_m2} (ref >59)
GFR calc non Af Amer: 104 mL/min/{1.73_m2} (ref >59)
Globulin, Total: 2.2 g/dL (ref 1.5–4.5)
Glucose: 94 mg/dL (ref 65–99)
Potassium: 4.2 mmol/L (ref 3.5–5.2)
Sodium: 141 mmol/L (ref 134–144)
Total Protein: 6.6 g/dL (ref 6.0–8.5)

## 2020-11-19 LAB — CBC WITH DIFFERENTIAL/PLATELET
Basophils Absolute: 0 10*3/uL (ref 0.0–0.2)
Basos: 0 %
EOS (ABSOLUTE): 0.3 10*3/uL (ref 0.0–0.4)
Eos: 5 %
Hematocrit: 43.3 % (ref 34.0–46.6)
Hemoglobin: 14.2 g/dL (ref 11.1–15.9)
Immature Grans (Abs): 0 10*3/uL (ref 0.0–0.1)
Immature Granulocytes: 1 %
Lymphocytes Absolute: 1.9 10*3/uL (ref 0.7–3.1)
Lymphs: 33 %
MCH: 28.9 pg (ref 26.6–33.0)
MCHC: 32.8 g/dL (ref 31.5–35.7)
MCV: 88 fL (ref 79–97)
Monocytes Absolute: 0.5 10*3/uL (ref 0.1–0.9)
Monocytes: 9 %
Neutrophils Absolute: 3.1 10*3/uL (ref 1.4–7.0)
Neutrophils: 52 %
Platelets: 165 10*3/uL (ref 150–450)
RBC: 4.91 x10E6/uL (ref 3.77–5.28)
RDW: 12.5 % (ref 11.7–15.4)
WBC: 5.9 10*3/uL (ref 3.4–10.8)

## 2020-11-19 LAB — AMYLASE: Amylase: 51 U/L (ref 31–110)

## 2020-11-19 LAB — LIPASE: Lipase: 38 U/L (ref 14–72)

## 2020-11-19 NOTE — Telephone Encounter (Signed)
Yes, am happy to see her as scheduled.

## 2020-11-19 NOTE — ED Provider Notes (Signed)
Virginia Oneal    CSN: [id number] Arrival date & time: 11/18/20  1502      History   Chief Complaint No chief complaint on file.   HPI Virginia Oneal is a 55 y.o. female.   Patient is a 55 year old female who presents today with abdominal pain.  This is located to the upper abdominal area and mid epigastric area.  This is been going on off and on for the past week.  Last episode was this past Friday.  Reporting the pain wakes her up in the night during her sleep and last for hours.  Usually she does not experience any pain during the day.  Has had some associated nausea but no vomiting.  Mild constipation but no diarrhea.  No fevers or chills.  No chest pain or shortness of breath.  Reporting she has been eating a mostly healthy diet.  Does not drink alcohol     Past Medical History:  Diagnosis Date  . Abnormal uterine bleeding   . Anxiety    postpartum and hx panic attacks  . Arrhythmia   . Chest mass    by CT scanning  . COVID-19 10/2020  . Depression   . Gestational diabetes   . Hx of migraines   . Kidney stones   . Personal history of colonic polyps   . Seasonal allergies   . Urinary incontinence 05/2009   hx bladder sling--Dr. Quincy Simmonds    Patient Active Problem List   Diagnosis Date Noted  . Acute bilateral low back pain without sciatica 06/15/2019  . Essential hypertension 11/09/2018  . Family history of colon cancer in mother and 2 aunts 06/03/2018  . Abdominal pain 02/24/2017  . Mediastinal cyst 02/24/2017  . Chest mass 10/08/2015  . Lipoma 08/13/2015  . Pedal edema 07/26/2014  . Chest pain 07/26/2014  . DOE (dyspnea on exertion) 07/26/2014  . Personal history of colonic polyps 01/18/2013  . Rotator cuff tendinitis 06/29/2011  . Anxiety 06/29/2011  . DIABETES MELLITUS, GESTATIONAL, HX OF 08/30/2008  . PREMATURE ATRIAL CONTRACTIONS 08/15/2008  . RENAL CALCULUS, HX OF 08/15/2008    Past Surgical History:  Procedure Laterality Date  . ABDOMINAL  HYSTERECTOMY  05/2009   TAH/LSO  . BLADDER SUSPENSION  2010   Dr. Quincy Simmonds  . BREAST BIOPSY     left stereo  . CESAREAN SECTION  03/2003  . CESAREAN SECTION  2004  . COLONOSCOPY  09/08  . COLPORRHAPHY  05/2009   Dr. Quincy Simmonds  . ENDOMETRIAL ABLATION  2006   Dr. Quincy Simmonds  . INCONTINENCE SURGERY     sling for urinary incontinence  . LAPAROSCOPIC ENDOMETRIOSIS FULGURATION     ablation  . OOPHORECTOMY  2010   w/hyst--Dr. Quincy Simmonds  . TUBAL LIGATION  2004   w/ C-section Dr. Quincy Simmonds  . TYMPANOPLASTY     right ear  . VAGINAL HYSTERECTOMY  05/21/2009   lap assisted /partial    OB History    Gravida  2   Para  2   Term  2   Preterm      AB      Living  2     SAB      IAB      Ectopic      Multiple      Live Births  2            Home Medications    Prior to Admission medications   Medication Sig Start Date End Date Taking?  Authorizing Provider  cetirizine (ZYRTEC) 10 MG tablet Take 10 mg by mouth daily.  01/10/18  Yes [provider]  Cholecalciferol (VITAMIN D3) 125 MCG (5000 UT) CAPS Take 1 capsule by mouth daily.   Yes [provider]  Cyanocobalamin (B-12) 100 MCG TABS Take 1 tablet by mouth daily.  01/10/18  Yes [provider]  famotidine (PEPCID) 20 MG tablet Take 1 tablet (20 mg total) by mouth 2 (two) times daily. 11/18/20  Yes Alexander Mcauley A, NP  hydrochlorothiazide (HYDRODIURIL) 25 MG tablet Take 1 tablet (25 mg total) by mouth daily. 04/10/20  Yes Elby Beck, FNP  LORazepam (ATIVAN) 0.5 MG tablet Take 1 tablet (0.5 mg total) by mouth every 12 (twelve) hours as needed for anxiety. 04/10/20  Yes Elby Beck, FNP  betamethasone valerate ointment (VALISONE) 0.1 % Apply 1 application topically 2 (two) times daily. 04/10/20   Elby Beck, FNP    Family History Family History  Problem Relation Age of Onset  . Kidney disease Mother        renal failure  . Stroke Mother   . Diabetes Mother   . Cancer - Colon Mother         deceased age 105  . Hypertension Mother   . Heart failure Mother   . Stroke Father   . Cancer Maternal Aunt        colon  . Colon cancer Maternal Aunt   . Hypertension Sister   . Rheum arthritis Sister   . Hypertension Brother   . Cancer - Colon Paternal Aunt        deceased  . Hypertension Sister   . Hypertension Sister   . Hypertension Sister   . Esophageal cancer Neg Hx   . Rectal cancer Neg Hx   . Stomach cancer Neg Hx   . Breast cancer Neg Hx     Social History Social History   Tobacco Use  . Smoking status: Former Smoker    Types: Cigarettes    Quit date: 01/06/1999    Years since quitting: 21.8  . Smokeless tobacco: Never Used  Vaping Use  . Vaping Use: Never used  Substance Use Topics  . Alcohol use: No    Alcohol/week: 0.0 standard drinks  . Drug use: No     Allergies   Buspar [buspirone hcl]   Review of Systems Review of Systems   Physical Exam Triage Vital Signs ED Triage Vitals  Enc Vitals Group     BP 11/18/20 1517 (!) 175/100     Pulse Rate 11/18/20 1517 86     Resp 11/18/20 1517 18     Temp 11/18/20 1517 98.3 F (36.8 C)     Temp Source 11/18/20 1517 Oral     SpO2 11/18/20 1517 98 %     Weight 11/18/20 1520 190 lb (86.2 kg)     Height 11/18/20 1520 5' 4.5" (1.638 m)     Head Circumference --      Peak Flow --      Pain Score 11/18/20 1519 0     Pain Loc --      Pain Edu? --      Excl. in [place]? --    No data found.  Updated Vital Signs BP (!) 175/100 (BP Location: Left Arm)   Pulse 86   Temp 98.3 F (36.8 C) (Oral)   Resp 18   Ht 5' 4.5" (1.638 m)   Wt 190 lb (86.2 kg)  SpO2 98%   BMI 32.11 kg/m   Visual Acuity Right Eye Distance:   Left Eye Distance:   Bilateral Distance:    Right Eye Near:   Left Eye Near:    Bilateral Near:     Physical Exam Vitals and nursing note reviewed.  Constitutional:      General: She is not in acute distress.    Appearance: Normal appearance. She is not ill-appearing, toxic-appearing  or diaphoretic.  HENT:     Head: Normocephalic.  Eyes:     Conjunctiva/sclera: Conjunctivae normal.  Cardiovascular:     Rate and Rhythm: Normal rate and regular rhythm.  Pulmonary:     Effort: Pulmonary effort is normal.     Breath sounds: Normal breath sounds.  Abdominal:     General: Bowel sounds are normal.     Palpations: Abdomen is soft.     Tenderness: There is no abdominal tenderness.     Comments: Non tender to palpation of the entire abdomen.   Musculoskeletal:        General: Normal range of motion.     Cervical back: Normal range of motion.  Skin:    General: Skin is warm and dry.     Findings: No rash.  Neurological:     Mental Status: She is alert.  Psychiatric:        Mood and Affect: Mood normal.      UC Treatments / Results  Labs (all labs ordered are listed, but only abnormal results are displayed) Labs Reviewed  COMPREHENSIVE METABOLIC PANEL - Abnormal; Notable for the following components:      Result Value   BUN/Creatinine Ratio 26 (*)    Alkaline Phosphatase 285 (*)    AST 324 (*)    ALT 756 (*)    All other components within normal limits   Narrative:    Performed at:  [address], [place], [place]  [id number] Lab Director: Rush Farmer MD, Phone:  [phone]  POCT URINALYSIS DIP (MANUAL ENTRY) - Abnormal; Notable for the following components:   Ketones, POC UA trace (5) (*)    Spec Grav, UA >=1.030 (*)    All other components within normal limits  CBC WITH DIFFERENTIAL/PLATELET   Narrative:    Performed at:  [facility] [address], [place], [place]  [id number] Lab Director: Rush Farmer MD, Phone:  [phone]  LIPASE   Narrative:    Performed at:  [facility] [address], [place], [place]  [id number] Lab Director: Rush Farmer MD, Phone:  [phone]  AMYLASE   Narrative:    Performed at:  [facility] [address], [place], [place]  [id number] Lab Director:  Rush Farmer MD, Phone:  [phone]    EKG   Radiology No results found.  Procedures Procedures (including critical care time)  Medications Ordered in UC Medications - No data to display  Initial Impression / Assessment and Plan / UC Course  I have reviewed the triage vital signs and the nursing notes.  Pertinent labs & imaging results that were available during my care of the patient were reviewed by me and considered in my medical decision making (see chart for details).  Clinical Course as of 11/19/20 0835  Tue Nov 19, 2020  0825 ALT(!!): 756 [TB]  0825 AST(!): 324 [TB]  0825 Alkaline Phosphatase(!): 285 [TB]    Clinical Course User Index [TB] Loura Halt A, NP    Abdominal pain. No  acute abdomen on exam today.  No pain upon palpation of the entire abdomen.  Patient has had this issue ongoing for approximately week mostly in the melanite waking her from her sleep. Urine here today without any concerns. We will draw some basic abdominal lab work to check a few things Patient may need outpatient ultrasound or CT scan Recommended start Pepcid twice a day for possible acid reflux. We will follow-up with results of lab work and possible outpatient imaging tomorrow if needed.  Final Clinical Impressions(s) / UC Diagnoses   Final diagnoses:  Abdominal pain, epigastric     Discharge Instructions     As we spoke about this issue could be related to your gallbladder, constipation, or acid reflux. I am checking some lab work that will be back in the morning. I would like for you to start Pepcid twice a day as needed. You can start this at bedtime since your symptoms seem to come during the middle of the night.  If your primary care cannot order an ultrasound of the gallbladder I will try too.      ED Prescriptions    Medication Sig Dispense Auth. Provider   famotidine (PEPCID) 20 MG tablet Take 1 tablet (20 mg total) by mouth 2 (two) times daily. 30 tablet Loura Halt A, NP     PDMP not reviewed this encounter.   Loura Halt A, NP 11/19/20 [phone]

## 2020-11-19 NOTE — Telephone Encounter (Signed)
Patient was seen at urgent care. Patient was a [facility] patient. Spoke with Leafy Ro and it looks like she is needing to be seen in our office due to possible gallbladder issues. She had covid on 11/08/20 but she is currently out of her 10 day window and having no symptoms. Patient is unaware of who and when she wants to do [facility] visit which would need to be discussed with patient at time of appointment. Patient is under the understanding that this is an acute visit and [facility] is not taking TOC patients. EM

## 2020-11-20 ENCOUNTER — Other Ambulatory Visit: Payer: Self-pay | Admitting: Primary Care

## 2020-11-20 ENCOUNTER — Ambulatory Visit (INDEPENDENT_AMBULATORY_CARE_PROVIDER_SITE_OTHER): Payer: 59 | Admitting: Primary Care

## 2020-11-20 ENCOUNTER — Ambulatory Visit (HOSPITAL_COMMUNITY)
Admission: RE | Admit: 2020-11-20 | Discharge: 2020-11-20 | Disposition: A | Discharge status: Home / Self Care | Payer: 59 | Source: Ambulatory Visit | Attending: Primary Care | Admitting: Primary Care

## 2020-11-20 ENCOUNTER — Other Ambulatory Visit: Payer: Self-pay

## 2020-11-20 ENCOUNTER — Encounter: Payer: Self-pay | Admitting: Primary Care

## 2020-11-20 ENCOUNTER — Telehealth: Payer: Self-pay

## 2020-11-20 VITALS — BP 144/88 | HR 82 | Temp 97.4°F | Ht 64.5 in | Wt 197.8 lb

## 2020-11-20 DIAGNOSIS — R310 Gross hematuria: Secondary | ICD-10-CM | POA: Insufficient documentation

## 2020-11-20 DIAGNOSIS — R937 Abnormal findings on diagnostic imaging of other parts of musculoskeletal system: Secondary | ICD-10-CM

## 2020-11-20 DIAGNOSIS — R1013 Epigastric pain: Secondary | ICD-10-CM

## 2020-11-20 DIAGNOSIS — R7989 Other specified abnormal findings of blood chemistry: Secondary | ICD-10-CM

## 2020-11-20 DIAGNOSIS — K8020 Calculus of gallbladder without cholecystitis without obstruction: Secondary | ICD-10-CM

## 2020-11-20 HISTORY — DX: Gross hematuria: R31.0

## 2020-11-20 LAB — CBC WITH DIFFERENTIAL/PLATELET
Basophils Absolute: 0 10*3/uL (ref 0.0–0.1)
Basophils Relative: 0.2 % (ref 0.0–3.0)
Eosinophils Absolute: 0.2 10*3/uL (ref 0.0–0.7)
Eosinophils Relative: 2.8 % (ref 0.0–5.0)
HCT: 40.9 % (ref 36.0–46.0)
Hemoglobin: 13.8 g/dL (ref 12.0–15.0)
Lymphocytes Relative: 34.5 % (ref 12.0–46.0)
Lymphs Abs: 2 10*3/uL (ref 0.7–4.0)
MCHC: 33.7 g/dL (ref 30.0–36.0)
MCV: 88.8 fl (ref 78.0–100.0)
Monocytes Absolute: 0.4 10*3/uL (ref 0.1–1.0)
Monocytes Relative: 7.4 % (ref 3.0–12.0)
Neutro Abs: 3.3 10*3/uL (ref 1.4–7.7)
Neutrophils Relative %: 55.1 % (ref 43.0–77.0)
Platelets: 139 10*3/uL — ABNORMAL LOW (ref 150.0–400.0)
RBC: 4.61 Mil/uL (ref 3.87–5.11)
RDW: 13.4 % (ref 11.5–15.5)
WBC: 5.9 10*3/uL (ref 4.0–10.5)

## 2020-11-20 LAB — POC URINALSYSI DIPSTICK (AUTOMATED)
Bilirubin, UA: NEGATIVE
Glucose, UA: NEGATIVE
Ketones, UA: NEGATIVE
Leukocytes, UA: NEGATIVE
Nitrite, UA: NEGATIVE
Protein, UA: NEGATIVE
Spec Grav, UA: 1.025 (ref 1.010–1.025)
Urobilinogen, UA: 0.2 E.U./dL
pH, UA: 5 (ref 5.0–8.0)

## 2020-11-20 LAB — COMPREHENSIVE METABOLIC PANEL
ALT: 384 U/L — ABNORMAL HIGH (ref 0–35)
AST: 82 U/L — ABNORMAL HIGH (ref 0–37)
Albumin: 4.3 g/dL (ref 3.5–5.2)
Alkaline Phosphatase: 192 U/L — ABNORMAL HIGH (ref 39–117)
BUN: 19 mg/dL (ref 6–23)
CO2: 32 mEq/L (ref 19–32)
Calcium: 10 mg/dL (ref 8.4–10.5)
Chloride: 101 mEq/L (ref 96–112)
Creatinine, Ser: 0.72 mg/dL (ref 0.40–1.20)
GFR: 93.39 mL/min (ref >60.00)
Glucose, Bld: 77 mg/dL (ref 70–99)
Potassium: 4 mEq/L (ref 3.5–5.1)
Sodium: 140 mEq/L (ref 135–145)
Total Bilirubin: 0.8 mg/dL (ref 0.2–1.2)
Total Protein: 6.9 g/dL (ref 6.0–8.3)

## 2020-11-20 LAB — LIPASE: Lipase: 37 U/L (ref 11.0–59.0)

## 2020-11-20 MED ORDER — IOHEXOL 9 MG/ML PO SOLN
ORAL | Status: AC
  Filled 2020-11-20: qty 1000

## 2020-11-20 MED ORDER — IOHEXOL 9 MG/ML PO SOLN
500.0000 mL | ORAL | Status: AC
Start: 2020-11-20 — End: 2020-11-20

## 2020-11-20 MED ORDER — IOHEXOL 300 MG/ML  SOLN
100.0000 mL | Freq: Once | INTRAMUSCULAR | Status: AC | PRN
  Administered 2020-11-20: 100 mL via INTRAVENOUS

## 2020-11-20 NOTE — Assessment & Plan Note (Addendum)
Occurring one week ago, lasting two days. Painless.   History of renal stones, doesn't seem to have passed a stone, but will obtain CT scan for a better look. Will also evaluate bladder or any other cause for hematuria. Partial hysterectomy in 2010.  UA today with trace blood, culture sent and pending.

## 2020-11-20 NOTE — Telephone Encounter (Signed)
Received call from [facility] Radiology with critical CT results from today.  Critical results  3. There is new sclerosis of the T12 vertebral body, concerning for osseous metastatic disease. Sequelae of osteomyelitis could have this appearance. There is no evidence of primary malignancy in the abdomen or pelvis. Recommend contrast enhanced MRI of the thoracic spine to further evaluate.  Brought to Allie Bossier, NP, attention. She ordered the CT and saw the pt in office today.

## 2020-11-20 NOTE — Telephone Encounter (Signed)
Spoke with patient via phone regarding results.  See result note.

## 2020-11-20 NOTE — Addendum Note (Signed)
Addended by: Kris Mouton on: 11/20/2020 09:35 AM   Modules accepted: Orders

## 2020-11-20 NOTE — Assessment & Plan Note (Signed)
Acute for one month, intermittent, waking from sleep.  Differentials include cholelithiasis, cholecystitis, pancreatitis. Also with gross hematuria.   LFT's from last week >3x upper limits of normal.  Repeat labs pending today. Add CBC with diff and Lipase.   Stat CT abdomen/pelvis ordered given epigastric symptoms and gross hematuria.

## 2020-11-20 NOTE — Progress Notes (Signed)
Subjective:    Patient ID: Virginia Oneal, female    DOB: [birth year], 55 y.o.   MRN: [id number]  HPI  This visit occurred during the SARS-CoV-2 public health emergency.  Safety protocols were in place, including screening questions prior to the visit, additional usage of staff PPE, and extensive cleaning of exam room while observing appropriate contact time as indicated for disinfecting solutions.   [name] is a 55 year old female patient of [facility] with a history of PAC, hypertension, renal stones, gestational diabetes, abdominal pain who presents today for urgent care follow up.  She presented to urgent care on 11/18/20 with upper and mid epigastric abdominal pain that had become intermittent for the prior week. The pain began to wake her from sleep, lasting for hours. Exam during her visit was non acute. She underwent labs and was told to start Pepcid BID.   Labs from urgent care showed ALT of 756, AST of 324, Alk Phos 285. Lipase and amylase negative. UA negative for blood and bacteria.   Today she endorses her pain is located to the epigastric region which occurs intermittently and has been present for the last month. Her pain only occurs late at night (1 am), wakes her from sleep, keeps her up until around 9 am and then resolves sporadically. She does have nausea with episodes, denies vomiting. She denies fevers, diarrhea, constipation. She has no difficulty eating or drinking, no pain with meals. She denies abdominal pain since last weekend (3-4 days ago).   She actually endorses a long history of generalized abdominal pain. She eats pudding and peanut butter as a snack sometimes, she ate this the evening before her episode. She completed a colonoscopy in 2019, no mention of diverticulosis or inflammatory disorders.   January 31st she noticed a moderate amount of bright red bleeding with urinating. This occurred intermittently for two days and abated after taking some AZO. She's  not seen blood since. Denies pelvic pain, dysuria. Partial hysterectomy in 2010. She never too famotidine as prescribed.   Review of Systems  Constitutional: Negative for fever.  Gastrointestinal: Positive for abdominal pain and nausea. Negative for blood in stool, constipation, diarrhea and vomiting.  Genitourinary: Positive for hematuria. Negative for dysuria, flank pain, frequency and vaginal discharge.       Past Medical History:  Diagnosis Date  . Abnormal uterine bleeding   . Anxiety    postpartum and hx panic attacks  . Arrhythmia   . Chest mass    by CT scanning  . COVID-19 10/2020  . Depression   . Gestational diabetes   . Hx of migraines   . Kidney stones   . Personal history of colonic polyps   . Seasonal allergies   . Urinary incontinence 05/2009   hx bladder sling--Dr. Quincy Simmonds     Social History   Socioeconomic History  . Marital status: Married    Spouse name: Not on file  . Number of children: 2  . Years of education: Not on file  . Highest education level: Not on file  Occupational History  . Occupation: [employer]  Tobacco Use  . Smoking status: Former Smoker    Types: Cigarettes    Quit date: 01/06/1999    Years since quitting: 21.8  . Smokeless tobacco: Never Used  Vaping Use  . Vaping Use: Never used  Substance and Sexual Activity  . Alcohol use: No    Alcohol/week: 0.0 standard drinks  . Drug use: No  .  Sexual activity: Yes    Partners: Male    Birth control/protection: Other-see comments, Surgical    Comment: TAH/LSO  Other Topics Concern  . Not on file  Social History Narrative   ** Merged History Encounter **       Social Determinants of Health   Financial Resource Strain: Not on file  Food Insecurity: Not on file  Transportation Needs: Not on file  Physical Activity: Not on file  Stress: Not on file  Social Connections: Not on file  Intimate Partner Violence: Not on file    Past Surgical History:  Procedure Laterality  Date  . ABDOMINAL HYSTERECTOMY  05/2009   TAH/LSO  . BLADDER SUSPENSION  2010   Dr. Quincy Simmonds  . BREAST BIOPSY     left stereo  . CESAREAN SECTION  03/2003  . CESAREAN SECTION  2004  . COLONOSCOPY  09/08  . COLPORRHAPHY  05/2009   Dr. Quincy Simmonds  . ENDOMETRIAL ABLATION  2006   Dr. Quincy Simmonds  . INCONTINENCE SURGERY     sling for urinary incontinence  . LAPAROSCOPIC ENDOMETRIOSIS FULGURATION     ablation  . OOPHORECTOMY  2010   w/hyst--Dr. Quincy Simmonds  . TUBAL LIGATION  2004   w/ C-section Dr. Quincy Simmonds  . TYMPANOPLASTY     right ear  . VAGINAL HYSTERECTOMY  05/21/2009   lap assisted /partial    Family History  Problem Relation Age of Onset  . Kidney disease Mother        renal failure  . Stroke Mother   . Diabetes Mother   . Cancer - Colon Mother        deceased age 30  . Hypertension Mother   . Heart failure Mother   . Stroke Father   . Cancer Maternal Aunt        colon  . Colon cancer Maternal Aunt   . Hypertension Sister   . Rheum arthritis Sister   . Hypertension Brother   . Cancer - Colon Paternal Aunt        deceased  . Hypertension Sister   . Hypertension Sister   . Hypertension Sister   . Esophageal cancer Neg Hx   . Rectal cancer Neg Hx   . Stomach cancer Neg Hx   . Breast cancer Neg Hx     Allergies  Allergen Reactions  . Buspar [Buspirone Hcl]     Worsened her panic attacks    Current Outpatient Medications on File Prior to Visit  Medication Sig Dispense Refill  . betamethasone valerate ointment (VALISONE) 0.1 % Apply 1 application topically 2 (two) times daily. 45 g 1  . cetirizine (ZYRTEC) 10 MG tablet Take 10 mg by mouth daily.     . Cholecalciferol (VITAMIN D3) 125 MCG (5000 UT) CAPS Take 1 capsule by mouth daily.    . Cyanocobalamin (B-12) 100 MCG TABS Take 1 tablet by mouth daily.     . hydrochlorothiazide (HYDRODIURIL) 25 MG tablet Take 1 tablet (25 mg total) by mouth daily. 90 tablet 3  . LORazepam (ATIVAN) 0.5 MG tablet Take 1 tablet (0.5 mg total) by  mouth every 12 (twelve) hours as needed for anxiety. 30 tablet 1  . famotidine (PEPCID) 20 MG tablet Take 1 tablet (20 mg total) by mouth 2 (two) times daily. (Patient not taking: Reported on 11/20/2020) 30 tablet 0   No current facility-administered medications on file prior to visit.    BP (!) 144/88   Pulse 82   Temp (!) 97.4  F (36.3 C) (Temporal)   Ht 5' 4.5" (1.638 m)   Wt 197 lb 12.8 oz (89.7 kg)   SpO2 99%   BMI 33.43 kg/m    Objective:   Physical Exam Constitutional:      Appearance: She is not ill-appearing.  Pulmonary:     Effort: Pulmonary effort is normal.  Abdominal:     General: Bowel sounds are normal.     Palpations: Abdomen is soft.     Tenderness: There is no abdominal tenderness. There is no guarding. Negative signs include Murphy's sign.  Skin:    General: Skin is warm and dry.  Neurological:     Mental Status: She is alert.            Assessment & Plan:

## 2020-11-20 NOTE — Patient Instructions (Addendum)
Stop by the lab prior to leaving today. I will notify you of your results once received.   Stop by the front desk and speak with Anastasiya regarding your CT scan. I'll be in touch with results later.  It was a pleasure meeting you!

## 2020-11-21 ENCOUNTER — Encounter: Payer: Self-pay | Admitting: Family Medicine

## 2020-11-21 ENCOUNTER — Telehealth: Payer: Self-pay | Admitting: Family Medicine

## 2020-11-21 ENCOUNTER — Other Ambulatory Visit: Payer: Self-pay | Admitting: Family Medicine

## 2020-11-21 LAB — URINE CULTURE
MICRO NUMBER:: 11514231
SPECIMEN QUALITY:: ADEQUATE

## 2020-11-21 NOTE — Telephone Encounter (Signed)
brandi called from [facility] stated that they would need a new order with / without contrast

## 2020-11-21 NOTE — Telephone Encounter (Signed)
New order faxed. 

## 2020-11-21 NOTE — Telephone Encounter (Signed)
Updated order. Routing to referral coordinator as Juluis Rainier

## 2020-11-25 ENCOUNTER — Other Ambulatory Visit: Payer: Self-pay | Admitting: Primary Care

## 2020-12-02 ENCOUNTER — Telehealth: Payer: Self-pay

## 2020-12-02 NOTE — Telephone Encounter (Signed)
MRI spine thoracic w/w/o contrast results received by fax. Msg sent to ordering provider, Allie Bossier, who is currently out of the office. Advised of results. Per Anda Kraft, she will f/u tomorrow.   Fax placed in [place].

## 2020-12-03 NOTE — Telephone Encounter (Signed)
Reviewed MRI results, also reviewed prior imaging within her records including CT chest, lumbar plain films. Also reviewed MRI results with physician in the office.  Recommend we bring her in for a visit with me to discuss her images, discuss personal and family history, and to discuss neck steps.    Virginia Oneal, will you schedule patient for a follow-up visit with me to discuss her MRI results and next steps?

## 2020-12-04 NOTE — Telephone Encounter (Signed)
Called patient to schedule follow up appt. LVM to call back.

## 2020-12-04 NOTE — Telephone Encounter (Signed)
Patient has an appointment scheduled for December 05, 2020.

## 2020-12-04 NOTE — Telephone Encounter (Signed)
Pt contacted office very concerned about her MRI results and was requesting some explanation so she would not have to wait so long. Pt was scheduled for 12/10/20 and said she was told that was the soonest opening the provider had. Advised pt it would be best if the provider explained the results and there were sooner apts if she wanted to be seen sooner. Pt agreed to apt tomorrow at 1020. Advised pt a msg would be sent to provider that she contacted office. Pt appreciative and verbalized understanding.

## 2020-12-04 NOTE — Telephone Encounter (Signed)
Not sure why she was scheduled for 03/01... she needs to be seen ASAP so thank you for getting her in!

## 2020-12-05 ENCOUNTER — Other Ambulatory Visit: Payer: Self-pay

## 2020-12-05 ENCOUNTER — Ambulatory Visit (INDEPENDENT_AMBULATORY_CARE_PROVIDER_SITE_OTHER): Payer: 59 | Admitting: Primary Care

## 2020-12-05 ENCOUNTER — Encounter: Payer: Self-pay | Admitting: Primary Care

## 2020-12-05 VITALS — BP 122/98 | HR 87 | Temp 96.8°F | Ht 64.5 in | Wt 199.5 lb

## 2020-12-05 DIAGNOSIS — M25541 Pain in joints of right hand: Secondary | ICD-10-CM

## 2020-12-05 DIAGNOSIS — M899 Disorder of bone, unspecified: Secondary | ICD-10-CM | POA: Diagnosis not present

## 2020-12-05 DIAGNOSIS — Z8261 Family history of arthritis: Secondary | ICD-10-CM | POA: Diagnosis not present

## 2020-12-05 DIAGNOSIS — M25542 Pain in joints of left hand: Secondary | ICD-10-CM

## 2020-12-05 LAB — SEDIMENTATION RATE: Sed Rate: 8 mm/hr (ref 0–30)

## 2020-12-05 LAB — C-REACTIVE PROTEIN: CRP: <1 mg/dL (ref 0.5–20.0)

## 2020-12-05 NOTE — Patient Instructions (Signed)
You will be contacted regarding your CT scan.  Please let us know if you have not been contacted within two weeks.   Stop by the lab prior to leaving today. I will notify you of your results once received.   Please set up a physical with me for July 2022.  It was a pleasure to see you today!

## 2020-12-05 NOTE — Assessment & Plan Note (Signed)
New finding at T12 vertebrae from CT abdomen pelvis, confirmed on MRI thoracic spine.  SPECT CT with whole bone scan recommended per radiology which was ordered today.  We discussed her MR thoracic spine results in great detail, all questions were answered.  Given her significant family history of colon, kidney and breast cancer to direct relatives we will proceed with imaging as recommended.

## 2020-12-05 NOTE — Assessment & Plan Note (Signed)
Chronic joint pain bilaterally, family history of rheumatoid arthritis and sister.  Labs including RF, CCP, sed rate, CRP ordered and pending.

## 2020-12-05 NOTE — Progress Notes (Signed)
Subjective:    Patient ID: Virginia Oneal, female    DOB: January 20, 1966, 55 y.o.   MRN: [id number]  HPI  This visit occurred during the SARS-CoV-2 public health emergency.  Safety protocols were in place, including screening questions prior to the visit, additional usage of staff PPE, and extensive cleaning of exam room while observing appropriate contact time as indicated for disinfecting solutions.   Virginia Oneal is a 55 year old female patient of Virginia Oneal with a history of gestational diabetes, anxiety, chest pain, chest mass, family history of colon cancer who presents today for follow-up of MRI thoracic spine.  She was last evaluated on 11/20/2020 for symptoms of upper abdominal pain.  She underwent CT abdomen pelvis which showed an incidental finding of new sclerosis to T12 vertebral body concerning for osseous metastatic disease.  Given this finding we proceeded with MRI thoracic spine which revealed multiple lesions of the thoracic spine, largest at the T12 site.  Lesions were nonspecific therefore SPECT-CT scan of upper thoracic to upper lumbar spine with whole-body scan was recommended for further evaluation.  Given these MRI findings we asked that she come in for discussion.  Her CT scan from 11/20/2020 also showed cholelithiasis with gallbladder sludge.  She was referred to general surgery for evaluation of gallbladder.  Since her last visit she has been contacted by the general surgery office and has an appointment scheduled for 12/26/20, Dr. Donne Hazel.   She endorses a family history of colon cancer in her mother and maternal aunt, breast cancer and kidney in her sister. Her last colonoscopy was in 2019, negative, due in 2023. Last mammogram was in October 2021, due again in 2022.  She does express chronic bilateral hand pain, was once provided with cortisone shots years ago. Family history of rheumatoid arthritis in one of her sisters. She has never been tested. Chronic back pain  history, following with orthopedics and has received injections.   Review of Systems  Constitutional: Negative for fever and unexpected weight change.  Gastrointestinal: Negative for abdominal pain and nausea.  Musculoskeletal: Positive for arthralgias and back pain.  Skin: Negative for color change.       Past Medical History:  Diagnosis Date  . Abnormal uterine bleeding   . Anxiety    postpartum and hx panic attacks  . Arrhythmia   . Chest mass    by CT scanning  . COVID-19 10/2020  . Depression   . Gestational diabetes   . Hx of migraines   . Kidney stones   . Personal history of colonic polyps   . Seasonal allergies   . Urinary incontinence 05/2009   hx bladder sling--Dr. Quincy Simmonds     Social History   Socioeconomic History  . Marital status: Married    Spouse name: Not on file  . Number of children: 2  . Years of education: Not on file  . Highest education level: Not on file  Occupational History  . Occupation: [employer]  Tobacco Use  . Smoking status: Former Smoker    Types: Cigarettes    Quit date: 01/06/1999    Years since quitting: 21.9  . Smokeless tobacco: Never Used  Vaping Use  . Vaping Use: Never used  Substance and Sexual Activity  . Alcohol use: No    Alcohol/week: 0.0 standard drinks  . Drug use: No  . Sexual activity: Yes    Partners: Male    Birth control/protection: Other-see comments, Surgical    Comment: TAH/LSO  Other Topics Concern  . Not on file  Social History Narrative   ** Merged History Encounter **       Social Determinants of Health   Financial Resource Strain: Not on file  Food Insecurity: Not on file  Transportation Needs: Not on file  Physical Activity: Not on file  Stress: Not on file  Social Connections: Not on file  Intimate Partner Violence: Not on file    Past Surgical History:  Procedure Laterality Date  . ABDOMINAL HYSTERECTOMY  05/2009   TAH/LSO  . BLADDER SUSPENSION  2010   Dr. Quincy Simmonds  . BREAST  BIOPSY     left stereo  . CESAREAN SECTION  03/2003  . CESAREAN SECTION  2004  . COLONOSCOPY  09/08  . COLPORRHAPHY  05/2009   Dr. Quincy Simmonds  . ENDOMETRIAL ABLATION  2006   Dr. Quincy Simmonds  . INCONTINENCE SURGERY     sling for urinary incontinence  . LAPAROSCOPIC ENDOMETRIOSIS FULGURATION     ablation  . OOPHORECTOMY  2010   w/hyst--Dr. Quincy Simmonds  . TUBAL LIGATION  2004   w/ C-section Dr. Quincy Simmonds  . TYMPANOPLASTY     right ear  . VAGINAL HYSTERECTOMY  05/21/2009   lap assisted /partial    Family History  Problem Relation Age of Onset  . Kidney disease Mother        renal failure  . Stroke Mother   . Diabetes Mother   . Hypertension Mother   . Heart failure Mother   . Colon cancer Mother   . Stroke Father   . Cancer - Colon Maternal Aunt   . Hypertension Sister   . Rheum arthritis Sister   . Hypertension Brother   . Hypertension Sister   . Hypertension Sister   . Hypertension Sister   . Breast cancer Sister   . Kidney cancer Sister   . Esophageal cancer Neg Hx   . Rectal cancer Neg Hx   . Stomach cancer Neg Hx     Allergies  Allergen Reactions  . Buspar [Buspirone Hcl]     Worsened her panic attacks    Current Outpatient Medications on File Prior to Visit  Medication Sig Dispense Refill  . betamethasone valerate ointment (VALISONE) 0.1 % Apply 1 application topically 2 (two) times daily. 45 g 1  . cetirizine (ZYRTEC) 10 MG tablet Take 10 mg by mouth daily.     . Cholecalciferol (VITAMIN D3) 125 MCG (5000 UT) CAPS Take 1 capsule by mouth daily.    . Cyanocobalamin (B-12) 100 MCG TABS Take 1 tablet by mouth daily.     . hydrochlorothiazide (HYDRODIURIL) 25 MG tablet Take 1 tablet (25 mg total) by mouth daily. 90 tablet 3  . LORazepam (ATIVAN) 0.5 MG tablet Take 1 tablet (0.5 mg total) by mouth every 12 (twelve) hours as needed for anxiety. 30 tablet 1  . famotidine (PEPCID) 20 MG tablet Take 1 tablet (20 mg total) by mouth 2 (two) times daily. (Patient not taking: Reported on  12/05/2020) 30 tablet 0   No current facility-administered medications on file prior to visit.    BP (!) 122/98   Pulse 87   Temp (!) 96.8 F (36 C) (Temporal)   Ht 5' 4.5" (1.638 m)   Wt 199 lb 8 oz (90.5 kg)   SpO2 99%   BMI 33.72 kg/m    Objective:   Physical Exam Constitutional:      Appearance: She is well-nourished.  Cardiovascular:     Rate  and Rhythm: Normal rate and regular rhythm.  Pulmonary:     Effort: Pulmonary effort is normal.     Breath sounds: Normal breath sounds.  Musculoskeletal:     Cervical back: Neck supple.     Thoracic back: Tenderness and bony tenderness present. No signs of trauma. Normal range of motion.       Back:     Comments: Tenderness to entire thoracic and lumbar spine.  Bilateral hand grip strength equal, no swelling to DIP, PIP, MCP joints  Skin:    General: Skin is warm and dry.  Psychiatric:        Mood and Affect: Mood and affect normal.            Assessment & Plan:

## 2020-12-06 LAB — RHEUMATOID FACTOR: Rheumatoid fact SerPl-aCnc: <14 IU/mL (ref <14)

## 2020-12-06 LAB — CYCLIC CITRUL PEPTIDE ANTIBODY, IGG: Cyclic Citrullin Peptide Ab: <16 UNITS

## 2020-12-10 ENCOUNTER — Ambulatory Visit: Payer: 59 | Admitting: Primary Care

## 2020-12-23 ENCOUNTER — Ambulatory Visit (HOSPITAL_COMMUNITY): Admission: RE | Admit: 2020-12-23 | Payer: 59 | Source: Ambulatory Visit

## 2020-12-23 ENCOUNTER — Other Ambulatory Visit (HOSPITAL_COMMUNITY): Payer: 59

## 2020-12-23 ENCOUNTER — Encounter
Admission: RE | Admit: 2020-12-23 | Discharge: 2020-12-23 | Disposition: A | Discharge status: Home / Self Care | Payer: 59 | Source: Ambulatory Visit | Attending: Primary Care | Admitting: Primary Care

## 2020-12-23 ENCOUNTER — Other Ambulatory Visit: Payer: Self-pay

## 2020-12-23 ENCOUNTER — Ambulatory Visit: Payer: 59

## 2020-12-23 ENCOUNTER — Ambulatory Visit (HOSPITAL_COMMUNITY): Payer: 59

## 2020-12-23 ENCOUNTER — Other Ambulatory Visit: Payer: 59

## 2020-12-23 DIAGNOSIS — M899 Disorder of bone, unspecified: Secondary | ICD-10-CM | POA: Insufficient documentation

## 2020-12-23 MED ORDER — TECHNETIUM TC 99M MEDRONATE IV KIT
20.0000 | PACK | Freq: Once | INTRAVENOUS | Status: AC | PRN
  Administered 2020-12-23: 23.22 via INTRAVENOUS

## 2020-12-25 ENCOUNTER — Other Ambulatory Visit: Payer: Self-pay | Admitting: Primary Care

## 2020-12-25 DIAGNOSIS — M899 Disorder of bone, unspecified: Secondary | ICD-10-CM

## 2020-12-25 DIAGNOSIS — R937 Abnormal findings on diagnostic imaging of other parts of musculoskeletal system: Secondary | ICD-10-CM

## 2020-12-26 ENCOUNTER — Other Ambulatory Visit: Payer: Self-pay | Admitting: General Surgery

## 2020-12-26 ENCOUNTER — Encounter (HOSPITAL_COMMUNITY): Payer: Self-pay

## 2020-12-26 NOTE — Patient Instructions (Signed)
DUE TO COVID-19 ONLY ONE VISITOR IS ALLOWED TO COME WITH YOU AND STAY IN THE WAITING ROOM ONLY DURING PRE OP AND PROCEDURE DAY OF SURGERY.   TWO VISITOR  MAY VISIT WITH YOU AFTER SURGERY IN YOUR PRIVATE ROOM DURING VISITING HOURS ONLY!  YOU NEED TO HAVE A COVID 19 TEST ON__N/A_____ @_______ , THIS TEST MUST BE DONE BEFORE SURGERY,  COVID TESTING SITE [place] [address],  IT IS ON THE RIGHT GOING OUT [address] APPROXIMATELY  2 MINUTES PAST [employer] ON THE RIGHT. ONCE YOUR COVID TEST IS COMPLETED,  PLEASE BEGIN THE QUARANTINE INSTRUCTIONS AS OUTLINED IN YOUR HANDOUT.                Virginia Oneal  12/26/2020   Your procedure is scheduled on: 01-02-21   Report to [facility] Main  Entrance   Report to admitting at      0530   AM     Call this number if you have problems the morning of surgery [phone]    Remember: [place] am .  Do not eat food  :After Midnight. You may have clear liquids until 0430 am then nothing by mouth    CLEAR LIQUID DIET                                                                   Black Coffee and tea, regular and decaf                               Plain Jell-O any favor except red or purple                                           Fruit ices (not with fruit pulp)                                       Iced Popsicles                                     Carbonated beverages, regular and diet                                    Cranberry, grape and apple juices Sports drinks like Gatorade Lightly seasoned clear broth or consume(fat free) Sugar, honey syrup   RUSH YOUR TEETH MORNING OF SURGERY AND RINSE YOUR MOUTH OUT, NO CHEWING GUM CANDY OR MINTS.     Take these medicines the morning of surgery with A SIP OF WATER: ativan if needed, zyrtec  You may not have any metal on your body  including hair pins and              piercings  Do not wear jewelry, make-up, lotions, powders or perfumes, deodorant             Do not wear nail polish on your fingernails.  Do not shave  48 hours prior to surgery.      Do not bring valuables to the hospital. [place].  Contacts, dentures or bridgework may not be worn into surgery.      Patients discharged the day of surgery will not be allowed to drive home. IF YOU ARE HAVING SURGERY AND GOING HOME THE SAME DAY, YOU MUST HAVE AN ADULT TO DRIVE YOU HOME AND BE WITH YOU FOR 24 HOURS. YOU MAY GO HOME BY TAXI OR UBER OR ORTHERWISE, BUT AN ADULT MUST ACCOMPANY YOU HOME AND STAY WITH YOU FOR 24 HOURS.  Name and phone number of your driver:  Special Instructions: N/A              Please read over the following fact sheets you were given: _____________________________________________________________________             [facility] - Preparing for Surgery Before surgery, you can play an important role.  Because skin is not sterile, your skin needs to be as free of germs as possible.  You can reduce the number of germs on your skin by washing with CHG (chlorahexidine gluconate) soap before surgery.  CHG is an antiseptic cleaner which kills germs and bonds with the skin to continue killing germs even after washing. Please DO NOT use if you have an allergy to CHG or antibacterial soaps.  If your skin becomes reddened/irritated stop using the CHG and inform your nurse when you arrive at Short Stay. Do not shave (including legs and underarms) for at least 48 hours prior to the first CHG shower.  You may shave your face/neck. Please follow these instructions carefully:  1.  Shower with CHG Soap the night before surgery and the  morning of Surgery.  2.  If you choose to wash your hair, wash your hair first as usual with your  normal  shampoo.  3.  After you shampoo, rinse your hair and body thoroughly  to remove the  shampoo.                           4.  Use CHG as you would any other liquid soap.  You can apply chg directly  to the skin and wash                       Gently with a scrungie or clean washcloth.  5.  Apply the CHG Soap to your body ONLY FROM THE NECK DOWN.   Do not use on face/ open                           Wound or open sores. Avoid contact with eyes, ears mouth and genitals (private parts).                       Wash face,  Genitals (private parts) with your normal soap.  6.  Wash thoroughly, paying special attention to the area where your surgery  will be performed.  7.  Thoroughly rinse your body with warm water from the neck down.  8.  DO NOT shower/wash with your normal soap after using and rinsing off  the CHG Soap.                9.  Pat yourself dry with a clean towel.            10.  Wear clean pajamas.            11.  Place clean sheets on your bed the night of your first shower and do not  sleep with pets. Day of Surgery : Do not apply any lotions/deodorants the morning of surgery.  Please wear clean clothes to the hospital/surgery center.  FAILURE TO FOLLOW THESE INSTRUCTIONS MAY RESULT IN THE CANCELLATION OF YOUR SURGERY PATIENT SIGNATURE_________________________________  NURSE SIGNATURE__________________________________  ________________________________________________________________________

## 2020-12-26 NOTE — Telephone Encounter (Signed)
Please advise 

## 2020-12-26 NOTE — Progress Notes (Addendum)
PCP - Alma Friendly, NP Cardiologist - no  PPM/ICD -  Device Orders -  Rep Notified -   Chest x-ray -  EKG - 12-30-20 Stress Test -  ECHO -  Cardiac Cath -   Sleep Study -  CPAP -   Fasting Blood Sugar -  Checks Blood Sugar _____ times a day  Blood Thinner Instructions: Aspirin Instructions:  ERAS Protcol - PRE-SURGERY Ensure   COVID TEST- + on 11-08-20 epic  Activity--- Able to walk a flight of stairs without SOB Anesthesia review HTN  Patient denies shortness of breath, fever, cough and chest pain at PAT appointment   All instructions explained to the patient, with a verbal understanding of the material. Patient agrees to go over the instructions while at home for a better understanding. Patient also instructed to self quarantine after being tested for COVID-19. The opportunity to ask questions was provided.

## 2020-12-30 ENCOUNTER — Other Ambulatory Visit: Payer: Self-pay

## 2020-12-30 ENCOUNTER — Encounter (HOSPITAL_COMMUNITY): Payer: Self-pay

## 2020-12-30 ENCOUNTER — Encounter (HOSPITAL_COMMUNITY)
Admission: RE | Admit: 2020-12-30 | Discharge: 2020-12-30 | Disposition: A | Discharge status: Home / Self Care | Payer: 59 | Source: Ambulatory Visit | Attending: General Surgery | Admitting: General Surgery

## 2020-12-30 DIAGNOSIS — Z01818 Encounter for other preprocedural examination: Secondary | ICD-10-CM | POA: Diagnosis present

## 2020-12-30 HISTORY — DX: Unspecified osteoarthritis, unspecified site: M19.90

## 2020-12-30 HISTORY — DX: Family history of other specified conditions: Z84.89

## 2020-12-30 HISTORY — DX: Essential (primary) hypertension: I10

## 2020-12-30 HISTORY — DX: Other specified postprocedural states: R11.2

## 2020-12-30 HISTORY — DX: Other complications of anesthesia, initial encounter: T88.59XA

## 2020-12-30 HISTORY — DX: Other specified postprocedural states: Z98.890

## 2020-12-30 HISTORY — DX: Personal history of urinary calculi: Z87.442

## 2020-12-30 HISTORY — DX: Anemia, unspecified: D64.9

## 2020-12-30 LAB — BASIC METABOLIC PANEL
Anion gap: 7 (ref 5–15)
BUN: 23 mg/dL — ABNORMAL HIGH (ref 6–20)
CO2: 29 mmol/L (ref 22–32)
Calcium: 9.6 mg/dL (ref 8.9–10.3)
Chloride: 107 mmol/L (ref 98–111)
Creatinine, Ser: 0.68 mg/dL (ref 0.44–1.00)
GFR, Estimated: >60 mL/min (ref >60)
Glucose, Bld: 101 mg/dL — ABNORMAL HIGH (ref 70–99)
Potassium: 4.1 mmol/L (ref 3.5–5.1)
Sodium: 143 mmol/L (ref 135–145)

## 2020-12-30 LAB — CBC
HCT: 42.1 % (ref 36.0–46.0)
Hemoglobin: 14 g/dL (ref 12.0–15.0)
MCH: 30.3 pg (ref 26.0–34.0)
MCHC: 33.3 g/dL (ref 30.0–36.0)
MCV: 91.1 fL (ref 80.0–100.0)
Platelets: 152 10*3/uL (ref 150–400)
RBC: 4.62 MIL/uL (ref 3.87–5.11)
RDW: 12.8 % (ref 11.5–15.5)
WBC: 8.3 10*3/uL (ref 4.0–10.5)
nRBC: 0 % (ref 0.0–0.2)

## 2021-01-01 NOTE — Anesthesia Preprocedure Evaluation (Addendum)
Anesthesia Evaluation  Patient identified by MRN, date of birth, ID band Patient awake    Reviewed: Allergy & Precautions, NPO status , Patient's Chart, lab work & pertinent test results  History of Anesthesia Complications (+) PONV and history of anesthetic complications  Airway Mallampati: II       Dental no notable dental hx.    Pulmonary former smoker,    Pulmonary exam normal        Cardiovascular hypertension, Pt. on medications Normal cardiovascular exam     Neuro/Psych PSYCHIATRIC DISORDERS Anxiety Depression    GI/Hepatic negative GI ROS, Neg liver ROS,   Endo/Other  diabetes, Gestational  Renal/GU negative Renal ROS  negative genitourinary   Musculoskeletal   Abdominal (+) + obese,   Peds  Hematology   Anesthesia Other Findings   Reproductive/Obstetrics                           Anesthesia Physical Anesthesia Plan  ASA: II  Anesthesia Plan: General   Post-op Pain Management:  Regional for Post-op pain   Induction: Intravenous  PONV Risk Score and Plan: 4 or greater and Ondansetron, Dexamethasone, Midazolam and Scopolamine patch - Pre-op  Airway Management Planned: Oral ETT  Additional Equipment: None  Intra-op Plan:   Post-operative Plan: Extubation in OR  Informed Consent: I have reviewed the patients History and Physical, chart, labs and discussed the procedure including the risks, benefits and alternatives for the proposed anesthesia with the patient or authorized representative who has indicated his/her understanding and acceptance.     Dental advisory given  Plan Discussed with: CRNA  Anesthesia Plan Comments:        Anesthesia Quick Evaluation

## 2021-01-02 ENCOUNTER — Encounter (HOSPITAL_COMMUNITY): Payer: Self-pay | Admitting: General Surgery

## 2021-01-02 ENCOUNTER — Ambulatory Visit (HOSPITAL_COMMUNITY): Payer: 59 | Admitting: Certified Registered Nurse Anesthetist

## 2021-01-02 ENCOUNTER — Ambulatory Visit (HOSPITAL_COMMUNITY)
Admission: RE | Admit: 2021-01-02 | Discharge: 2021-01-02 | Disposition: A | Discharge status: Home / Self Care | Payer: 59 | Attending: General Surgery | Admitting: General Surgery

## 2021-01-02 ENCOUNTER — Encounter (HOSPITAL_COMMUNITY)
Admission: RE | Disposition: A | Discharge status: Home / Self Care | Payer: Self-pay | Source: Home / Self Care | Attending: General Surgery

## 2021-01-02 DIAGNOSIS — Z79899 Other long term (current) drug therapy: Secondary | ICD-10-CM | POA: Insufficient documentation

## 2021-01-02 DIAGNOSIS — E119 Type 2 diabetes mellitus without complications: Secondary | ICD-10-CM | POA: Insufficient documentation

## 2021-01-02 DIAGNOSIS — Z820 Family history of epilepsy and other diseases of the nervous system: Secondary | ICD-10-CM | POA: Insufficient documentation

## 2021-01-02 DIAGNOSIS — K828 Other specified diseases of gallbladder: Secondary | ICD-10-CM | POA: Insufficient documentation

## 2021-01-02 DIAGNOSIS — Z8261 Family history of arthritis: Secondary | ICD-10-CM | POA: Insufficient documentation

## 2021-01-02 DIAGNOSIS — Z8249 Family history of ischemic heart disease and other diseases of the circulatory system: Secondary | ICD-10-CM | POA: Diagnosis not present

## 2021-01-02 DIAGNOSIS — K8010 Calculus of gallbladder with chronic cholecystitis without obstruction: Secondary | ICD-10-CM | POA: Diagnosis not present

## 2021-01-02 DIAGNOSIS — Z800 Family history of malignant neoplasm of digestive organs: Secondary | ICD-10-CM | POA: Diagnosis not present

## 2021-01-02 DIAGNOSIS — Z888 Allergy status to other drugs, medicaments and biological substances status: Secondary | ICD-10-CM | POA: Insufficient documentation

## 2021-01-02 DIAGNOSIS — Z87891 Personal history of nicotine dependence: Secondary | ICD-10-CM | POA: Diagnosis not present

## 2021-01-02 DIAGNOSIS — Z803 Family history of malignant neoplasm of breast: Secondary | ICD-10-CM | POA: Diagnosis not present

## 2021-01-02 DIAGNOSIS — Z833 Family history of diabetes mellitus: Secondary | ICD-10-CM | POA: Insufficient documentation

## 2021-01-02 HISTORY — PX: CHOLECYSTECTOMY: SHX55

## 2021-01-02 SURGERY — LAPAROSCOPIC CHOLECYSTECTOMY
Anesthesia: General

## 2021-01-02 MED ORDER — KETOROLAC TROMETHAMINE 30 MG/ML IJ SOLN: 30.0000 mg | Freq: Once | INTRAMUSCULAR | Status: DC | PRN

## 2021-01-02 MED ORDER — SCOPOLAMINE 1 MG/3DAYS TD PT72
MEDICATED_PATCH | TRANSDERMAL | Status: AC
  Filled 2021-01-02: qty 1

## 2021-01-02 MED ORDER — OXYCODONE HCL 5 MG/5ML PO SOLN: 5.0000 mg | Freq: Once | ORAL | Status: DC | PRN

## 2021-01-02 MED ORDER — SCOPOLAMINE 1 MG/3DAYS TD PT72
MEDICATED_PATCH | TRANSDERMAL | Status: DC | PRN
  Administered 2021-01-02: 1 via TRANSDERMAL

## 2021-01-02 MED ORDER — FENTANYL CITRATE (PF) 100 MCG/2ML IJ SOLN
INTRAMUSCULAR | Status: DC | PRN
  Administered 2021-01-02 (×2): 50 ug via INTRAVENOUS
  Administered 2021-01-02: 100 ug via INTRAVENOUS
  Administered 2021-01-02: 50 ug via INTRAVENOUS

## 2021-01-02 MED ORDER — ENSURE PRE-SURGERY PO LIQD
296.0000 mL | Freq: Once | ORAL | Status: DC
  Filled 2021-01-02: qty 296

## 2021-01-02 MED ORDER — MIDAZOLAM HCL 5 MG/5ML IJ SOLN
INTRAMUSCULAR | Status: DC | PRN
  Administered 2021-01-02: 2 mg via INTRAVENOUS

## 2021-01-02 MED ORDER — FENTANYL CITRATE (PF) 100 MCG/2ML IJ SOLN: 25.0000 ug | INTRAMUSCULAR | Status: DC | PRN

## 2021-01-02 MED ORDER — ONDANSETRON HCL 4 MG/2ML IJ SOLN: 4.0000 mg | Freq: Once | INTRAMUSCULAR | Status: DC | PRN

## 2021-01-02 MED ORDER — LIDOCAINE 2% (20 MG/ML) 5 ML SYRINGE
INTRAMUSCULAR | Status: DC | PRN
  Administered 2021-01-02: 100 mg via INTRAVENOUS

## 2021-01-02 MED ORDER — KETOROLAC TROMETHAMINE 15 MG/ML IJ SOLN
15.0000 mg | INTRAMUSCULAR | Status: AC
  Administered 2021-01-02: 15 mg via INTRAVENOUS
  Filled 2021-01-02: qty 1

## 2021-01-02 MED ORDER — BUPIVACAINE-EPINEPHRINE 0.25% -1:200000 IJ SOLN
INTRAMUSCULAR | Status: DC | PRN
  Administered 2021-01-02: 5 mL

## 2021-01-02 MED ORDER — BUPIVACAINE-EPINEPHRINE (PF) 0.25% -1:200000 IJ SOLN
INTRAMUSCULAR | Status: AC
  Filled 2021-01-02: qty 30

## 2021-01-02 MED ORDER — ROPIVACAINE HCL 5 MG/ML IJ SOLN
INTRAMUSCULAR | Status: DC | PRN
  Administered 2021-01-02 (×6): 5 mL via PERINEURAL

## 2021-01-02 MED ORDER — ACETAMINOPHEN 500 MG PO TABS
1000.0000 mg | ORAL_TABLET | ORAL | Status: AC
  Administered 2021-01-02: 1000 mg via ORAL
  Filled 2021-01-02: qty 2

## 2021-01-02 MED ORDER — CEFAZOLIN SODIUM-DEXTROSE 2-4 GM/100ML-% IV SOLN
2.0000 g | INTRAVENOUS | Status: AC
  Administered 2021-01-02: 2 g via INTRAVENOUS
  Filled 2021-01-02: qty 100

## 2021-01-02 MED ORDER — LACTATED RINGERS IV SOLN: INTRAVENOUS | Status: DC

## 2021-01-02 MED ORDER — FENTANYL CITRATE (PF) 100 MCG/2ML IJ SOLN
INTRAMUSCULAR | Status: AC
  Filled 2021-01-02: qty 2

## 2021-01-02 MED ORDER — MIDAZOLAM HCL 2 MG/2ML IJ SOLN
INTRAMUSCULAR | Status: AC
  Filled 2021-01-02: qty 2

## 2021-01-02 MED ORDER — CHLORHEXIDINE GLUCONATE 0.12 % MT SOLN
15.0000 mL | Freq: Once | OROMUCOSAL | Status: AC
  Administered 2021-01-02: 15 mL via OROMUCOSAL

## 2021-01-02 MED ORDER — ROCURONIUM BROMIDE 10 MG/ML (PF) SYRINGE
PREFILLED_SYRINGE | INTRAVENOUS | Status: AC
  Filled 2021-01-02: qty 10

## 2021-01-02 MED ORDER — DEXMEDETOMIDINE (PRECEDEX) IN NS 20 MCG/5ML (4 MCG/ML) IV SYRINGE
PREFILLED_SYRINGE | INTRAVENOUS | Status: DC | PRN
  Administered 2021-01-02: 8 ug via INTRAVENOUS

## 2021-01-02 MED ORDER — PROPOFOL 10 MG/ML IV BOLUS
INTRAVENOUS | Status: AC
  Filled 2021-01-02: qty 20

## 2021-01-02 MED ORDER — OXYCODONE HCL 5 MG PO TABS
5.0000 mg | ORAL_TABLET | Freq: Once | ORAL | Status: DC | PRN
Start: 2021-01-02 — End: 2021-01-02

## 2021-01-02 MED ORDER — LIDOCAINE 2% (20 MG/ML) 5 ML SYRINGE
INTRAMUSCULAR | Status: AC
  Filled 2021-01-02: qty 5

## 2021-01-02 MED ORDER — ONDANSETRON HCL 4 MG/2ML IJ SOLN
INTRAMUSCULAR | Status: DC | PRN
  Administered 2021-01-02: 4 mg via INTRAVENOUS

## 2021-01-02 MED ORDER — PROPOFOL 10 MG/ML IV BOLUS
INTRAVENOUS | Status: DC | PRN
  Administered 2021-01-02: 150 mg via INTRAVENOUS

## 2021-01-02 MED ORDER — DEXAMETHASONE SODIUM PHOSPHATE 10 MG/ML IJ SOLN
INTRAMUSCULAR | Status: AC
  Filled 2021-01-02: qty 1

## 2021-01-02 MED ORDER — MEPERIDINE HCL 50 MG/ML IJ SOLN: 6.2500 mg | INTRAMUSCULAR | Status: DC | PRN

## 2021-01-02 MED ORDER — OXYCODONE HCL 5 MG PO TABS: 5.0000 mg | ORAL_TABLET | Freq: Four times a day (QID) | ORAL | 0 refills | Status: DC | PRN

## 2021-01-02 MED ORDER — LACTATED RINGERS IV SOLN
INTRAVENOUS | Status: DC | PRN
  Administered 2021-01-02: 1000 mL

## 2021-01-02 MED ORDER — SUGAMMADEX SODIUM 200 MG/2ML IV SOLN
INTRAVENOUS | Status: DC | PRN
  Administered 2021-01-02: 200 mg via INTRAVENOUS

## 2021-01-02 MED ORDER — ROCURONIUM BROMIDE 10 MG/ML (PF) SYRINGE
PREFILLED_SYRINGE | INTRAVENOUS | Status: DC | PRN
  Administered 2021-01-02: 70 mg via INTRAVENOUS

## 2021-01-02 MED ORDER — ACETAMINOPHEN 325 MG PO TABS: 325.0000 mg | ORAL_TABLET | ORAL | Status: DC | PRN

## 2021-01-02 MED ORDER — ONDANSETRON HCL 4 MG/2ML IJ SOLN
INTRAMUSCULAR | Status: AC
  Filled 2021-01-02: qty 2

## 2021-01-02 MED ORDER — DEXAMETHASONE SODIUM PHOSPHATE 10 MG/ML IJ SOLN
INTRAMUSCULAR | Status: DC | PRN
  Administered 2021-01-02: 6 mg via INTRAVENOUS

## 2021-01-02 MED ORDER — ORAL CARE MOUTH RINSE: 15.0000 mL | Freq: Once | OROMUCOSAL | Status: AC

## 2021-01-02 MED ORDER — OXYCODONE HCL 5 MG PO TABS
5.0000 mg | ORAL_TABLET | ORAL | Status: DC | PRN
Start: 2021-01-02 — End: 2021-01-02

## 2021-01-02 MED ORDER — ACETAMINOPHEN 160 MG/5ML PO SOLN: 325.0000 mg | ORAL | Status: DC | PRN

## 2021-01-02 SURGICAL SUPPLY — 48 items
ADH SKN CLS APL DERMABOND .7 (GAUZE/BANDAGES/DRESSINGS) ×1
APL PRP STRL LF DISP 70% ISPRP (MISCELLANEOUS) ×1
APL SKNCLS STERI-STRIP NONHPOA (GAUZE/BANDAGES/DRESSINGS)
APPLIER CLIP 5 13 M/L LIGAMAX5 (MISCELLANEOUS) ×2
APPLIER CLIP ROT 10 11.4 M/L (STAPLE)
APR CLP MED LRG 11.4X10 (STAPLE)
APR CLP MED LRG 5 ANG JAW (MISCELLANEOUS) ×1
BAG SPEC RTRVL 10 TROC 200 (ENDOMECHANICALS) ×1
BENZOIN TINCTURE PRP APPL 2/3 (GAUZE/BANDAGES/DRESSINGS) IMPLANT
CABLE HIGH FREQUENCY MONO STRZ (ELECTRODE) ×2 IMPLANT
CHLORAPREP W/TINT 26 (MISCELLANEOUS) ×2 IMPLANT
CLIP APPLIE 5 13 M/L LIGAMAX5 (MISCELLANEOUS) ×1 IMPLANT
CLIP APPLIE ROT 10 11.4 M/L (STAPLE) IMPLANT
COVER MAYO STAND STRL (DRAPES) IMPLANT
COVER SURGICAL LIGHT HANDLE (MISCELLANEOUS) ×2 IMPLANT
COVER WAND RF STERILE (DRAPES) IMPLANT
DECANTER SPIKE VIAL GLASS SM (MISCELLANEOUS) ×2 IMPLANT
DERMABOND ADVANCED (GAUZE/BANDAGES/DRESSINGS) ×1
DERMABOND ADVANCED .7 DNX12 (GAUZE/BANDAGES/DRESSINGS) IMPLANT
DEVICE TROCAR PUNCTURE CLOSURE (ENDOMECHANICALS) IMPLANT
DRAPE C-ARM 42X120 X-RAY (DRAPES) IMPLANT
ELECT REM PT RETURN 15FT ADLT (MISCELLANEOUS) ×2 IMPLANT
GLOVE SURG ENC MOIS LTX SZ7 (GLOVE) ×2 IMPLANT
GLOVE SURG UNDER POLY LF SZ7.5 (GLOVE) ×2 IMPLANT
GOWN STRL REUS W/TWL LRG LVL3 (GOWN DISPOSABLE) ×2 IMPLANT
GOWN STRL REUS W/TWL XL LVL3 (GOWN DISPOSABLE) ×4 IMPLANT
HEMOSTAT SNOW SURGICEL 2X4 (HEMOSTASIS) IMPLANT
KIT BASIN OR (CUSTOM PROCEDURE TRAY) ×2 IMPLANT
KIT TURNOVER KIT A (KITS) ×2 IMPLANT
PENCIL SMOKE EVACUATOR (MISCELLANEOUS) IMPLANT
POUCH RETRIEVAL ECOSAC 10 (ENDOMECHANICALS) ×1 IMPLANT
POUCH RETRIEVAL ECOSAC 10MM (ENDOMECHANICALS) ×2
PROTECTOR NERVE ULNAR (MISCELLANEOUS) IMPLANT
SCISSORS LAP 5X35 DISP (ENDOMECHANICALS) ×2 IMPLANT
SET CHOLANGIOGRAPH MIX (MISCELLANEOUS) IMPLANT
SET IRRIG TUBING LAPAROSCOPIC (IRRIGATION / IRRIGATOR) ×2 IMPLANT
SET TUBE SMOKE EVAC HIGH FLOW (TUBING) ×2 IMPLANT
SLEEVE XCEL OPT CAN 5 100 (ENDOMECHANICALS) ×4 IMPLANT
STRIP CLOSURE SKIN 1/2X4 (GAUZE/BANDAGES/DRESSINGS) IMPLANT
SUT MNCRL AB 4-0 PS2 18 (SUTURE) ×2 IMPLANT
SUT VICRYL 0 TIES 12 18 (SUTURE) IMPLANT
SUT VICRYL 0 UR6 27IN ABS (SUTURE) IMPLANT
TOWEL OR 17X26 10 PK STRL BLUE (TOWEL DISPOSABLE) ×2 IMPLANT
TOWEL OR NON WOVEN STRL DISP B (DISPOSABLE) ×2 IMPLANT
TRAY LAPAROSCOPIC (CUSTOM PROCEDURE TRAY) ×2 IMPLANT
TROCAR BLADELESS OPT 5 100 (ENDOMECHANICALS) ×2 IMPLANT
TROCAR XCEL BLUNT TIP 100MML (ENDOMECHANICALS) ×2 IMPLANT
TROCAR XCEL NON-BLD 11X100MML (ENDOMECHANICALS) IMPLANT

## 2021-01-02 NOTE — Transfer of Care (Signed)
Immediate Anesthesia Transfer of Care Note  Patient: Virginia Oneal  Procedure(s) Performed: LAPAROSCOPIC CHOLECYSTECTOMY (N/A )  Patient Location: PACU  Anesthesia Type:GA combined with regional for post-op pain  Level of Consciousness: awake, drowsy and patient cooperative  Airway & Oxygen Therapy: Patient Spontanous Breathing and Patient connected to face mask oxygen  Post-op Assessment: Report given to RN and Post -op Vital signs reviewed and stable  Post vital signs: Reviewed and stable  Last Vitals:  Vitals Value Taken Time  BP 155/86 01/02/21 0832  Temp    Pulse 81 01/02/21 0833  Resp 17 01/02/21 0833  SpO2 100 % 01/02/21 0833  Vitals shown include unvalidated device data.  Last Pain:  Vitals:   01/02/21 0621  TempSrc: Oral  PainSc:          Complications: No complications documented.

## 2021-01-02 NOTE — Anesthesia Procedure Notes (Signed)
Anesthesia Regional Block: TAP block   Pre-Anesthetic Checklist: ,, timeout performed, Correct Patient, Correct Site, Correct Laterality, Correct Procedure, Correct Position, site marked, Risks and benefits discussed,  Surgical consent,  Pre-op evaluation,  At surgeon's request and post-op pain management  Laterality: Right and N/A  Prep: chloraprep       Needles:  Injection technique: Single-shot  Needle Type: Echogenic Stimulator Needle     Needle Length: 9cm  Needle Gauge: 20   Needle insertion depth: 3 cm   Additional Needles:   Procedures:,,,, ultrasound used (permanent image in chart),,,,  Narrative:  Start time: 01/02/2021 8:08 AM End time: 01/02/2021 8:18 AM Injection made incrementally with aspirations every 5 mL.  Performed by: Personally  Anesthesiologist: Lyn Hollingshead, MD

## 2021-01-02 NOTE — Anesthesia Postprocedure Evaluation (Signed)
Anesthesia Post Note  Patient: Virginia Oneal  Procedure(s) Performed: LAPAROSCOPIC CHOLECYSTECTOMY (N/A )     Patient location during evaluation: PACU Anesthesia Type: General Level of consciousness: sedated Pain management: pain level controlled Vital Signs Assessment: post-procedure vital signs reviewed and stable Respiratory status: spontaneous breathing Cardiovascular status: stable Postop Assessment: no apparent nausea or vomiting Anesthetic complications: no   No complications documented.  Last Vitals:  Vitals:   01/02/21 0833 01/02/21 0845  BP: (!) 155/86 (!) 153/90  Pulse:  81  Resp: 15 (!) 21  Temp: 36.4 C   SpO2: 100% 100%    Last Pain:  Vitals:   01/02/21 0845  TempSrc:   PainSc: [place] Jr

## 2021-01-02 NOTE — Discharge Instructions (Signed)
[facility] LAPAROSCOPIC SURGERY: POST OP INSTRUCTIONS  Always review your discharge instruction sheet given to you by the facility where your surgery was performed. IF YOU HAVE DISABILITY OR FAMILY LEAVE FORMS, YOU MUST BRING THEM TO THE OFFICE FOR PROCESSING.   DO NOT GIVE THEM TO YOUR DOCTOR.  1. A prescription for pain medication may be given to you upon discharge.  Take your pain medication as prescribed, if needed.  If narcotic pain medicine is not needed, then you may take acetaminophen (Tylenol), naprosyn (Alleve), or ibuprofen (Advil) as needed. 2. Take your usually prescribed medications unless otherwise directed. 3. If you need a refill on your pain medication, please contact your pharmacy.  They will contact our office to request authorization. Prescriptions will not be filled after 5pm or on week-ends. 4. You should follow a light diet the first few days after arrival home, such as soup and crackers, etc.  Be sure to include lots of fluids daily. 5. Most patients will experience some swelling and bruising in the area of the incisions.  Ice packs will help.  Swelling and bruising can take several days to resolve.  6. It is common to experience some constipation if taking pain medication after surgery.  Increasing fluid intake and taking a stool softener (such as Colace) will usually help or prevent this problem from occurring.  A mild laxative (Milk of Magnesia or Miralax) should be taken according to package instructions if there are no bowel movements after 48 hours. 7. Unless discharge instructions indicate otherwise, you may remove your bandages 48 hours after surgery, and you may shower at that time.  You may have steri-strips (small skin tapes) in place directly over the incision.  These strips should be left on the skin for 7-10 days.  If your surgeon used skin glue on the incision, you may shower in 24 hours.  The glue will flake  off over the next 2-3 weeks.  Any sutures or staples will be removed at the office during your follow-up visit. 8. ACTIVITIES:  You may resume regular (light) daily activities beginning the next day--such as daily self-care, walking, climbing stairs--gradually increasing activities as tolerated.  You may have sexual intercourse when it is comfortable.  Refrain from any heavy lifting or straining until approved by your doctor. a. You may drive when you are no longer taking prescription pain medication, you can comfortably wear a seatbelt, and you can safely maneuver your car and apply brakes. b. RETURN TO WORK:  __________________________________________________________ 9. You should see your doctor in the office for a follow-up appointment approximately 2-3 weeks after your surgery.  Make sure that you call for this appointment within a day or two after you arrive home to insure a convenient appointment time. 10. OTHER INSTRUCTIONS: __________________________________________________________________________________________________________________________ __________________________________________________________________________________________________________________________ WHEN TO CALL YOUR DOCTOR: 1. Fever over 101.0 2. Inability to urinate 3. Continued bleeding from incision. 4. Increased pain, redness, or drainage from the incision. 5. Increasing abdominal pain  The clinic staff is available to answer your questions during regular business hours.  Please don't hesitate to call and ask to speak to one of the nurses for clinical concerns.  If you have a medical emergency, go to the nearest emergency room or call 911.  A surgeon from [facility] is always on call at the hospital. [address], [address], [place], [place]  [address] ? [address] [address], [place], [place]   [address] [phone] ? [phone] ? [phone] Web site: [email or web address]  

## 2021-01-02 NOTE — H&P (Signed)
55 yof who has two episodes recently of epigastric/ruq pain. these were after eating something fatty. lasted 7-8 hours and then went away. last was two weeks ago. she had nausea, no emesis. she had ct scan that shows a small nonobstructive kidney stone in right inferior pole. there is a sclerotic lesions at T12- doesnt recall trauma that is on bone scan also. this is undergoing further evaluation and likely needs an mri. her ct also showed cholelithiasis. she is here to discuss options   Past Surgical History Janeann Forehand, CNA; 12/26/2020 8:58 AM) Breast Biopsy  Left. Cesarean Section - 1  Colon Polyp Removal - Colonoscopy  Hysterectomy (not due to cancer) - Partial  Oral Surgery   Diagnostic Studies History Janeann Forehand, CNA; 12/26/2020 8:58 AM) Colonoscopy  1-5 years ago Mammogram  within last year Pap Smear  1-5 years ago  Allergies Janeann Forehand, CNA; 12/26/2020 8:58 AM) BuSpar *ANTIANXIETY AGENTS*  Allergies Reconciled   Medication History Janeann Forehand, CNA; 12/26/2020 8:58 AM) hydroCHLOROthiazide (25MG  Tablet, Oral) Active. Betamethasone Valerate (0.1% Ointment, External) Active. LORazepam (0.5MG  Tablet, Oral) Active. Medications Reconciled  Social History Janeann Forehand, CNA; 12/26/2020 8:58 AM) Alcohol use  Remotely quit alcohol use. Caffeine use  Carbonated beverages, Coffee, Tea. No drug use  Tobacco use  Former smoker.  Family History Janeann Forehand, CNA; 12/26/2020 8:58 AM) Anesthetic complications  Mother. Arthritis  Mother, Sister. Bleeding disorder  Daughter. Breast Cancer  Sister. Colon Cancer  Family Members In General, Mother. Diabetes Mellitus  Brother. Hypertension  Brother, Father, Mother, Sister. Migraine Headache  Sister.  Pregnancy / Birth History Janeann Forehand, CNA; 12/26/2020 8:58 AM) Age at menarche  13 years, 55 years. Age of menopause  48-50 Gravida  2 Irregular periods  Maternal age   57-40 31-35 Para  2  Other Problems Janeann Forehand, CNA; 12/26/2020 8:58 AM) Anxiety Disorder  Arthritis  Back Pain  Cholelithiasis  Diabetes Mellitus  High blood pressure  Kidney Stone  Migraine Headache  Oophorectomy  Left.    Review of Systems Janeann Forehand CNA; 12/26/2020 8:58 AM) General Present- Night Sweats. Not Present- Appetite Loss, Chills, Fatigue, Fever, Weight Gain and Weight Loss. Skin Not Present- Change in Wart/Mole, Dryness, Hives, Jaundice, New Lesions, Non-Healing Wounds, Rash and Ulcer. HEENT Not Present- Earache, Hearing Loss, Hoarseness, Nose Bleed, Oral Ulcers, Ringing in the Ears, Seasonal Allergies, Sinus Pain, Sore Throat, Visual Disturbances, Wears glasses/contact lenses and Yellow Eyes. Breast Not Present- Breast Mass, Breast Pain, Nipple Discharge and Skin Changes. Cardiovascular Not Present- Chest Pain, Difficulty Breathing Lying Down, Leg Cramps, Palpitations, Rapid Heart Rate, Shortness of Breath and Swelling of Extremities. Gastrointestinal Not Present- Abdominal Pain, Bloating, Bloody Stool, Change in Bowel Habits, Chronic diarrhea, Constipation, Difficulty Swallowing, Excessive gas, Gets full quickly at meals, Hemorrhoids, Indigestion, Nausea, Rectal Pain and Vomiting. Female Genitourinary Not Present- Frequency, Nocturia, Painful Urination, Pelvic Pain and Urgency. Musculoskeletal Present- Back Pain. Not Present- Joint Pain, Joint Stiffness, Muscle Pain, Muscle Weakness and Swelling of Extremities. Neurological Not Present- Decreased Memory, Fainting, Headaches, Numbness, Seizures, Tingling, Tremor, Trouble walking and Weakness. Psychiatric Not Present- Anxiety, Bipolar, Change in Sleep Pattern, Depression, Fearful and Frequent crying. Endocrine Present- Hot flashes. Not Present- Cold Intolerance, Excessive Hunger, Hair Changes, Heat Intolerance and New Diabetes. Hematology Not Present- Blood Thinners, Easy Bruising, Excessive bleeding,  Gland problems, HIV and Persistent Infections.  Vitals Adriana Reams Alston CNA; 12/26/2020 8:59 AM) 12/26/2020 8:58 AM Weight: 201.5 lb Height: 65in Body Surface Area: 1.98 m Body Mass Index: 33.53 kg/m  Temp.: 97.51F  Pulse: 79 (Regular)  P.OX: 97% (Room air) BP: 130/80(Sitting, Left Arm, Standard)       Physical Exam Rolm Bookbinder MD; 12/26/2020 9:18 AM) General Mental Status-Alert. Orientation-Oriented X3.  Abdomen Note: soft moderate tender ruq and epigastrium no murphys sign, scars all lower abdomen well healed     Assessment & Plan Rolm Bookbinder MD; 12/26/2020 9:21 AM) GALLSTONES (K80.20) Story: Laparoscopic cholecystectomy I think T12 lesion needs evaluation still but she is symptomatic from gb and I think needs surgery for this. I discussed the procedure in detail. We discussed the risks and benefits of a laparoscopic cholecystectomy and possible cholangiogram including, but not limited to bleeding, infection, injury to surrounding structures such as the intestine or liver, bile leak, retained gallstones, need to convert to an open procedure, prolonged diarrhea, blood clots such as DVT, common bile duct injury, anesthesia risks, and possible need for additional procedures. The likelihood of improvement in symptoms and return to the patient's normal status is good. We discussed the typical post-operative recovery course.

## 2021-01-02 NOTE — Op Note (Signed)
Preoperative diagnosis: Symptomatic cholelithiasis Postoperative diagnosis: Chronic cholecystitis Procedure: Laparoscopic cholecystectomy Surgeon: Dr. Serita Grammes Anesthesia: General with bilateral tap block Estimated blood loss: 30 cc Specimens: Gallbladder and contents to pathology Complications: None Drains: None Sponge needle count was correct completion Disposition recovery stable condition  Indications: 50 yof who has two episodes recently of epigastric/ruq pain. these were after eating something fatty. lasted 7-8 hours and then went away. last was two weeks ago. she had nausea, no emesis. she had ct scan that shows a small nonobstructive kidney stone in right inferior pole. there is a sclerotic lesions at T12- doesnt recall trauma that is on bone scan also. this is undergoing further evaluation and likely needs an mri. her ct also showed cholelithiasis.  We discussed proceeding with a laparoscopic cholecystectomy soon due to her symptoms.  Procedure: After informed consent was obtained the patient was taken to the operating room.  She had undergone a bilateral tap block.  She was given antibiotics.  SCDs were in place.  She was placed under general anesthesia without complication.  She was prepped and draped in the standard sterile surgical fashion.  A surgical timeout was then performed.  I filtrated Marcaine below the umbilicus.  I made a vertical incision.  I grasped the fascia and incised this sharply.  I entered into the peritoneum bluntly.  There was no evidence of an entry injury.  I placed a 0 Vicryl pursestring suture through the fascia.  I then inserted a Hassan trocar and insufflated the abdomen to 15 mmHg pressure.  I then inserted 3 further 5 mm trochars in the epigastrium and right side of the abdomen.  I noted her to have several larger hepatic cysts.  The gallbladder was noted to have chronic cholecystitis.  I retracted the gallbladder cephalad.  There were adhesions  to the omentum that I took down bluntly.  I then retracted the gallbladder lateral.  I was able to dissect and obtain the critical view of safety.  I remove the gallbladder from the liver to ensure that I was having an appropriate view.  I then clipped the artery 3 times and divided it leaving 2 clips in place.  I treated the duct in a similar fashion.  The duct was viable and the clips completely traversed the duct.  I then remove the gallbladder from the liver bed without difficulty.  I placed this in a retrieval bag and removed it from the abdomen.  Hemostasis was then obtained.  I then removed my Hassan trocar and tied my pursestring down.  I placed 2 additional 0 Vicryl sutures to completely obliterate the defect because this fascia was not very strong.  I then desufflated the abdomen and remove the remaining trochars.  These were closed with 4 Monocryl and glue.  She tolerated this well was extubated and transferred to recovery stable.

## 2021-01-02 NOTE — Interval H&P Note (Signed)
History and Physical Interval Note:  01/02/2021 7:06 AM  Virginia Oneal  has presented today for surgery, with the diagnosis of GALLSTONES.  The various methods of treatment have been discussed with the patient and family. After consideration of risks, benefits and other options for treatment, the patient has consented to  Procedure(s) with comments: LAPAROSCOPIC CHOLECYSTECTOMY (N/A) - 60  - GEN AND TAP BLOCK LDOW as a surgical intervention.  The patient's history has been reviewed, patient examined, no change in status, stable for surgery.  I have reviewed the patient's chart and labs.  Questions were answered to the patient's satisfaction.     Rolm Bookbinder

## 2021-01-02 NOTE — Anesthesia Procedure Notes (Signed)
Procedure Name: Intubation Date/Time: 01/02/2021 7:36 AM Performed by: West Pugh, CRNA Pre-anesthesia Checklist: Patient identified, Emergency Drugs available, Suction available, Patient being monitored and Timeout performed Patient Re-evaluated:Patient Re-evaluated prior to induction Oxygen Delivery Method: Circle system utilized Preoxygenation: Pre-oxygenation with 100% oxygen Induction Type: IV induction Ventilation: Mask ventilation without difficulty Laryngoscope Size: Mac and 3 Grade View: Grade II Tube type: Oral Tube size: 7.0 mm Number of attempts: 1 Airway Equipment and Method: Stylet Placement Confirmation: ETT inserted through vocal cords under direct vision,  positive ETCO2,  CO2 detector and breath sounds checked- equal and bilateral Secured at: 22 cm Tube secured with: Tape Dental Injury: Teeth and Oropharynx as per pre-operative assessment

## 2021-01-02 NOTE — Addendum Note (Signed)
Addendum  created 01/02/21 1902 by West Pugh, CRNA   Charge Capture section accepted

## 2021-01-03 ENCOUNTER — Encounter (HOSPITAL_COMMUNITY): Payer: Self-pay | Admitting: General Surgery

## 2021-01-03 LAB — SURGICAL PATHOLOGY

## 2021-02-18 ENCOUNTER — Other Ambulatory Visit: Payer: Self-pay | Admitting: Neurological Surgery

## 2021-02-18 DIAGNOSIS — M899 Disorder of bone, unspecified: Secondary | ICD-10-CM

## 2021-03-02 ENCOUNTER — Ambulatory Visit
Admission: RE | Admit: 2021-03-02 | Discharge: 2021-03-02 | Disposition: A | Discharge status: Home / Self Care | Payer: 59 | Source: Ambulatory Visit | Attending: Neurological Surgery | Admitting: Neurological Surgery

## 2021-03-02 ENCOUNTER — Other Ambulatory Visit: Payer: Self-pay

## 2021-03-02 DIAGNOSIS — M899 Disorder of bone, unspecified: Secondary | ICD-10-CM

## 2021-03-02 MED ORDER — GADOBENATE DIMEGLUMINE 529 MG/ML IV SOLN
18.0000 mL | Freq: Once | INTRAVENOUS | Status: AC | PRN
  Administered 2021-03-02: 18 mL via INTRAVENOUS

## 2021-03-20 ENCOUNTER — Other Ambulatory Visit: Payer: Self-pay | Admitting: Neurological Surgery

## 2021-04-04 ENCOUNTER — Encounter (HOSPITAL_COMMUNITY): Payer: Self-pay | Admitting: Neurological Surgery

## 2021-04-04 NOTE — Progress Notes (Signed)
Unable to reach patient via phone.  Left a detailed message on machine with instructions for day of surgery.  DUE TO COVID-19 ONLY ONE VISITOR IS ALLOWED TO COME WITH YOU AND STAY IN THE WAITING ROOM ONLY DURING PRE OP AND PROCEDURE DAY OF SURGERY.   PCP - Alma Friendly, NP Cardiologist - n/a  Chest x-ray - n/a EKG - 12/30/20 Stress Test - 08/31/14 ECHO - 08/31/14 Cardiac Cath - n/a  Anesthesia review: Yes  STOP now taking any Aspirin (unless otherwise instructed by your surgeon), Aleve, Naproxen, Ibuprofen, Motrin, Advil, Goody's, BC's, all herbal medications, fish oil, and all vitamins.   Coronavirus Screening Covid test is scheduled on DOS

## 2021-04-06 NOTE — Anesthesia Preprocedure Evaluation (Addendum)
Anesthesia Evaluation  Patient identified by MRN, date of birth, ID band Patient awake    Reviewed: Allergy & Precautions, H&P , NPO status , Patient's Chart, lab work & pertinent test results  History of Anesthesia Complications (+) PONV and Family history of anesthesia reaction  Airway Mallampati: III  TM Distance: >3 FB Neck ROM: Full    Dental no notable dental hx. (+) Teeth Intact, Dental Advisory Given   Pulmonary neg pulmonary ROS, former smoker,    Pulmonary exam normal breath sounds clear to auscultation       Cardiovascular Exercise Tolerance: Good hypertension, Pt. on medications + DOE   Rhythm:Regular Rate:Normal     Neuro/Psych Anxiety Depression negative neurological ROS     GI/Hepatic negative GI ROS, Neg liver ROS,   Endo/Other  diabetes, GestationalMorbid obesity  Renal/GU negative Renal ROS  negative genitourinary   Musculoskeletal  (+) Arthritis , Osteoarthritis,    Abdominal   Peds  Hematology  (+) Blood dyscrasia, anemia ,   Anesthesia Other Findings   Reproductive/Obstetrics negative OB ROS                            Anesthesia Physical Anesthesia Plan  ASA: 3  Anesthesia Plan: General   Post-op Pain Management:    Induction: Intravenous  PONV Risk Score and Plan: 4 or greater and Ondansetron, Dexamethasone, Midazolam and Aprepitant  Airway Management Planned: Oral ETT  Additional Equipment:   Intra-op Plan:   Post-operative Plan: Extubation in OR  Informed Consent: I have reviewed the patients History and Physical, chart, labs and discussed the procedure including the risks, benefits and alternatives for the proposed anesthesia with the patient or authorized representative who has indicated his/her understanding and acceptance.     Dental advisory given  Plan Discussed with: CRNA  Anesthesia Plan Comments:        Anesthesia Quick  Evaluation

## 2021-04-07 ENCOUNTER — Other Ambulatory Visit: Payer: Self-pay

## 2021-04-07 ENCOUNTER — Encounter (HOSPITAL_COMMUNITY)
Admission: RE | Disposition: A | Discharge status: Home / Self Care | Payer: Self-pay | Source: Home / Self Care | Attending: Neurological Surgery

## 2021-04-07 ENCOUNTER — Ambulatory Visit (HOSPITAL_COMMUNITY): Payer: 59 | Admitting: Physician Assistant

## 2021-04-07 ENCOUNTER — Encounter (HOSPITAL_COMMUNITY): Payer: Self-pay | Admitting: Neurological Surgery

## 2021-04-07 ENCOUNTER — Ambulatory Visit (HOSPITAL_COMMUNITY): Payer: 59

## 2021-04-07 ENCOUNTER — Ambulatory Visit (HOSPITAL_COMMUNITY)
Admission: RE | Admit: 2021-04-07 | Discharge: 2021-04-07 | Disposition: A | Discharge status: Home / Self Care | Payer: 59 | Attending: Neurological Surgery | Admitting: Neurological Surgery

## 2021-04-07 DIAGNOSIS — M899 Disorder of bone, unspecified: Secondary | ICD-10-CM | POA: Diagnosis present

## 2021-04-07 DIAGNOSIS — Z79899 Other long term (current) drug therapy: Secondary | ICD-10-CM | POA: Insufficient documentation

## 2021-04-07 DIAGNOSIS — Z87891 Personal history of nicotine dependence: Secondary | ICD-10-CM | POA: Diagnosis not present

## 2021-04-07 DIAGNOSIS — Z8616 Personal history of COVID-19: Secondary | ICD-10-CM | POA: Insufficient documentation

## 2021-04-07 DIAGNOSIS — Z20822 Contact with and (suspected) exposure to covid-19: Secondary | ICD-10-CM | POA: Insufficient documentation

## 2021-04-07 DIAGNOSIS — Z888 Allergy status to other drugs, medicaments and biological substances status: Secondary | ICD-10-CM | POA: Diagnosis not present

## 2021-04-07 DIAGNOSIS — D7589 Other specified diseases of blood and blood-forming organs: Secondary | ICD-10-CM | POA: Insufficient documentation

## 2021-04-07 DIAGNOSIS — Z419 Encounter for procedure for purposes other than remedying health state, unspecified: Secondary | ICD-10-CM

## 2021-04-07 HISTORY — PX: KYPHOPLASTY: SHX5884

## 2021-04-07 LAB — BASIC METABOLIC PANEL
Anion gap: 7 (ref 5–15)
BUN: 22 mg/dL — ABNORMAL HIGH (ref 6–20)
CO2: 28 mmol/L (ref 22–32)
Calcium: 9.2 mg/dL (ref 8.9–10.3)
Chloride: 104 mmol/L (ref 98–111)
Creatinine, Ser: 0.76 mg/dL (ref 0.44–1.00)
GFR, Estimated: >60 mL/min (ref >60)
Glucose, Bld: 109 mg/dL — ABNORMAL HIGH (ref 70–99)
Potassium: 3.8 mmol/L (ref 3.5–5.1)
Sodium: 139 mmol/L (ref 135–145)

## 2021-04-07 LAB — CBC
HCT: 40.6 % (ref 36.0–46.0)
Hemoglobin: 13.2 g/dL (ref 12.0–15.0)
MCH: 30 pg (ref 26.0–34.0)
MCHC: 32.5 g/dL (ref 30.0–36.0)
MCV: 92.3 fL (ref 80.0–100.0)
Platelets: 130 10*3/uL — ABNORMAL LOW (ref 150–400)
RBC: 4.4 MIL/uL (ref 3.87–5.11)
RDW: 12.3 % (ref 11.5–15.5)
WBC: 6.2 10*3/uL (ref 4.0–10.5)
nRBC: 0 % (ref 0.0–0.2)

## 2021-04-07 LAB — SARS CORONAVIRUS 2 BY RT PCR (HOSPITAL ORDER, PERFORMED IN ~~LOC~~ HOSPITAL LAB): SARS Coronavirus 2: NEGATIVE

## 2021-04-07 LAB — SURGICAL PCR SCREEN
MRSA, PCR: NEGATIVE
Staphylococcus aureus: POSITIVE — AB

## 2021-04-07 SURGERY — KYPHOPLASTY
Anesthesia: General | Site: Back

## 2021-04-07 MED ORDER — ORAL CARE MOUTH RINSE: 15.0000 mL | Freq: Once | OROMUCOSAL | Status: AC

## 2021-04-07 MED ORDER — APREPITANT 40 MG PO CAPS
40.0000 mg | ORAL_CAPSULE | Freq: Once | ORAL | Status: AC
  Administered 2021-04-07: 40 mg via ORAL
  Filled 2021-04-07: qty 1

## 2021-04-07 MED ORDER — ONDANSETRON HCL 4 MG/2ML IJ SOLN
INTRAMUSCULAR | Status: DC | PRN
  Administered 2021-04-07: 4 mg via INTRAVENOUS

## 2021-04-07 MED ORDER — CHLORHEXIDINE GLUCONATE CLOTH 2 % EX PADS: 6.0000 | MEDICATED_PAD | Freq: Once | CUTANEOUS | Status: DC

## 2021-04-07 MED ORDER — LIDOCAINE-EPINEPHRINE 1 %-1:100000 IJ SOLN
INTRAMUSCULAR | Status: AC
  Filled 2021-04-07: qty 1

## 2021-04-07 MED ORDER — ROCURONIUM BROMIDE 10 MG/ML (PF) SYRINGE
PREFILLED_SYRINGE | INTRAVENOUS | Status: DC | PRN
  Administered 2021-04-07: 70 mg via INTRAVENOUS

## 2021-04-07 MED ORDER — LIDOCAINE-EPINEPHRINE 1 %-1:100000 IJ SOLN
INTRAMUSCULAR | Status: DC | PRN
  Administered 2021-04-07: 5 mL

## 2021-04-07 MED ORDER — LIDOCAINE 2% (20 MG/ML) 5 ML SYRINGE
INTRAMUSCULAR | Status: DC | PRN
  Administered 2021-04-07: 100 mg via INTRAVENOUS

## 2021-04-07 MED ORDER — DEXAMETHASONE SODIUM PHOSPHATE 10 MG/ML IJ SOLN
INTRAMUSCULAR | Status: AC
  Filled 2021-04-07: qty 1

## 2021-04-07 MED ORDER — PROPOFOL 10 MG/ML IV BOLUS
INTRAVENOUS | Status: DC | PRN
  Administered 2021-04-07: 150 mg via INTRAVENOUS

## 2021-04-07 MED ORDER — ACETAMINOPHEN 500 MG PO TABS
1000.0000 mg | ORAL_TABLET | Freq: Once | ORAL | Status: AC
  Administered 2021-04-07: 1000 mg via ORAL
  Filled 2021-04-07: qty 2

## 2021-04-07 MED ORDER — LACTATED RINGERS IV SOLN: INTRAVENOUS | Status: DC

## 2021-04-07 MED ORDER — FENTANYL CITRATE (PF) 250 MCG/5ML IJ SOLN
INTRAMUSCULAR | Status: AC
  Filled 2021-04-07: qty 5

## 2021-04-07 MED ORDER — MIDAZOLAM HCL 2 MG/2ML IJ SOLN
INTRAMUSCULAR | Status: DC | PRN
  Administered 2021-04-07: 2 mg via INTRAVENOUS

## 2021-04-07 MED ORDER — BUPIVACAINE HCL (PF) 0.5 % IJ SOLN
INTRAMUSCULAR | Status: DC | PRN
  Administered 2021-04-07: 10 mL
  Administered 2021-04-07: 5 mL

## 2021-04-07 MED ORDER — CHLORHEXIDINE GLUCONATE 0.12 % MT SOLN
15.0000 mL | Freq: Once | OROMUCOSAL | Status: AC
  Administered 2021-04-07: 15 mL via OROMUCOSAL
  Filled 2021-04-07: qty 15

## 2021-04-07 MED ORDER — CEFAZOLIN SODIUM-DEXTROSE 2-3 GM-%(50ML) IV SOLR
INTRAVENOUS | Status: DC | PRN
  Administered 2021-04-07: 2 g via INTRAVENOUS

## 2021-04-07 MED ORDER — DEXAMETHASONE SODIUM PHOSPHATE 10 MG/ML IJ SOLN
INTRAMUSCULAR | Status: DC | PRN
  Administered 2021-04-07: 10 mg via INTRAVENOUS

## 2021-04-07 MED ORDER — MIDAZOLAM HCL 2 MG/2ML IJ SOLN
INTRAMUSCULAR | Status: AC
  Filled 2021-04-07: qty 2

## 2021-04-07 MED ORDER — CEFAZOLIN SODIUM-DEXTROSE 2-4 GM/100ML-% IV SOLN
2.0000 g | INTRAVENOUS | Status: DC
  Filled 2021-04-07: qty 100

## 2021-04-07 MED ORDER — LIDOCAINE 2% (20 MG/ML) 5 ML SYRINGE
INTRAMUSCULAR | Status: AC
  Filled 2021-04-07: qty 5

## 2021-04-07 MED ORDER — HYDROMORPHONE HCL 1 MG/ML IJ SOLN: 0.2500 mg | INTRAMUSCULAR | Status: DC | PRN

## 2021-04-07 MED ORDER — SUGAMMADEX SODIUM 200 MG/2ML IV SOLN
INTRAVENOUS | Status: DC | PRN
  Administered 2021-04-07: 400 mg via INTRAVENOUS

## 2021-04-07 MED ORDER — 0.9 % SODIUM CHLORIDE (POUR BTL) OPTIME
TOPICAL | Status: DC | PRN
  Administered 2021-04-07: 1000 mL

## 2021-04-07 MED ORDER — PROPOFOL 10 MG/ML IV BOLUS
INTRAVENOUS | Status: AC
  Filled 2021-04-07: qty 20

## 2021-04-07 MED ORDER — ONDANSETRON HCL 4 MG/2ML IJ SOLN
INTRAMUSCULAR | Status: AC
  Filled 2021-04-07: qty 2

## 2021-04-07 MED ORDER — BUPIVACAINE HCL (PF) 0.5 % IJ SOLN
INTRAMUSCULAR | Status: AC
  Filled 2021-04-07: qty 30

## 2021-04-07 MED ORDER — FENTANYL CITRATE (PF) 250 MCG/5ML IJ SOLN
INTRAMUSCULAR | Status: DC | PRN
  Administered 2021-04-07: 100 ug via INTRAVENOUS

## 2021-04-07 SURGICAL SUPPLY — 41 items
ADH SKN CLS APL DERMABOND .7 (GAUZE/BANDAGES/DRESSINGS) ×1
BLADE CLIPPER SURG (BLADE) IMPLANT
BLADE SURG 11 STRL SS (BLADE) ×1 IMPLANT
BNDG ADH 1X3 SHEER STRL LF (GAUZE/BANDAGES/DRESSINGS) ×4 IMPLANT
BNDG ADH THN 3X1 STRL LF (GAUZE/BANDAGES/DRESSINGS)
CONT SPEC 4OZ CLIKSEAL STRL BL (MISCELLANEOUS) ×5 IMPLANT
COVER WAND RF STERILE (DRAPES) ×1 IMPLANT
DECANTER SPIKE VIAL GLASS SM (MISCELLANEOUS) ×1 IMPLANT
DERMABOND ADVANCED (GAUZE/BANDAGES/DRESSINGS) ×2
DERMABOND ADVANCED .7 DNX12 (GAUZE/BANDAGES/DRESSINGS) IMPLANT
DEVICE BIOPSY BONE KYPHX (INSTRUMENTS) ×2 IMPLANT
DRAPE C-ARM 42X72 X-RAY (DRAPES) ×3 IMPLANT
DRAPE C-ARMOR (DRAPES) ×2 IMPLANT
DRAPE HALF SHEET 40X57 (DRAPES) ×3 IMPLANT
DRAPE INCISE IOBAN 66X45 STRL (DRAPES) ×3 IMPLANT
DRAPE LAPAROTOMY 100X72X124 (DRAPES) ×3 IMPLANT
DRAPE WARM FLUID 44X44 (DRAPES) ×1 IMPLANT
DURAPREP 26ML APPLICATOR (WOUND CARE) ×3 IMPLANT
GAUZE 4X4 16PLY RFD (DISPOSABLE) ×3 IMPLANT
GLOVE EXAM NITRILE XL STR (GLOVE) IMPLANT
GLOVE SURG LTX SZ8.5 (GLOVE) ×3 IMPLANT
GLOVE SURG UNDER POLY LF SZ8.5 (GLOVE) ×3 IMPLANT
GOWN STRL REUS W/ TWL LRG LVL3 (GOWN DISPOSABLE) IMPLANT
GOWN STRL REUS W/ TWL XL LVL3 (GOWN DISPOSABLE) ×1 IMPLANT
GOWN STRL REUS W/TWL 2XL LVL3 (GOWN DISPOSABLE) ×3 IMPLANT
GOWN STRL REUS W/TWL LRG LVL3 (GOWN DISPOSABLE)
GOWN STRL REUS W/TWL XL LVL3 (GOWN DISPOSABLE) ×3
KIT BASIN OR (CUSTOM PROCEDURE TRAY) ×3 IMPLANT
KIT OSTEOCOOL BONE ACCESS 8G (MISCELLANEOUS) ×4 IMPLANT
KIT TURNOVER KIT B (KITS) ×3 IMPLANT
NDL HYPO 25X1 1.5 SAFETY (NEEDLE) ×1 IMPLANT
NEEDLE HYPO 25X1 1.5 SAFETY (NEEDLE) ×3 IMPLANT
NS IRRIG 1000ML POUR BTL (IV SOLUTION) ×3 IMPLANT
PACK SURGICAL SETUP 50X90 (CUSTOM PROCEDURE TRAY) ×3 IMPLANT
PAD ARMBOARD 7.5X6 YLW CONV (MISCELLANEOUS) ×9 IMPLANT
SPECIMEN JAR SMALL (MISCELLANEOUS) IMPLANT
SUT VIC AB 4-0 RB1 18 (SUTURE) ×2 IMPLANT
SUT VICRYL RAPIDE 4/0 PS 2 (SUTURE) ×1 IMPLANT
SYR CONTROL 10ML LL (SYRINGE) ×6 IMPLANT
TOWEL GREEN STERILE (TOWEL DISPOSABLE) ×3 IMPLANT
TOWEL GREEN STERILE FF (TOWEL DISPOSABLE) ×3 IMPLANT

## 2021-04-07 NOTE — Addendum Note (Signed)
Addendum  created 04/07/21 1026 by Bryson Corona, CRNA   Flowsheet accepted, Intraprocedure Flowsheets edited

## 2021-04-07 NOTE — H&P (Signed)
Virginia Oneal is an 55 y.o. female.   Chief Complaint: Abnormal bone marrow T 12 HPI: Virginia Oneal is a 55 year old individual whose had significant back pain thoracolumbar junction area she has a subtle abnormality in the T12 vertebrae which is progressed slightly over a few months time there is heterogeneous changes and T1-T2 signal in T12 and a small extraosseous mass in the ventral epidural space.  She has been advised regarding a bone marrow biopsy to better define this lesion.  There is no history of any malignancy.  Past Medical History:  Diagnosis Date   Abnormal uterine bleeding    Anemia    as a teenager   Anxiety    postpartum and hx panic attacks   Arrhythmia    Arthritis    back and hands   Chest mass    by CT scanning   Complication of anesthesia    slow to wake up   COVID-19 10/2020   Depression    Family history of adverse reaction to anesthesia    mom slow to wake up   Gestational diabetes    resolved after pregnancy   History of kidney stones    Hypertension    Personal history of colonic polyps    PONV (postoperative nausea and vomiting)    Seasonal allergies    Urinary incontinence 05/2009   hx bladder sling--Dr. Quincy Simmonds    Past Surgical History:  Procedure Laterality Date   ABDOMINAL HYSTERECTOMY  05/2009   TAH/LSO   BLADDER SUSPENSION  2010   Dr. Quincy Simmonds   BREAST BIOPSY     left stereo   CESAREAN SECTION  03/2003   CESAREAN SECTION  2004   CHOLECYSTECTOMY N/A 01/02/2021   Procedure: LAPAROSCOPIC CHOLECYSTECTOMY;  Surgeon: Rolm Bookbinder, MD;  Location: [facility];  Service: General;  Laterality: N/A;  46  - GEN AND TAP BLOCK LDOW   COLONOSCOPY  09/08   COLPORRHAPHY  05/2009   Dr. Quincy Simmonds   ENDOMETRIAL ABLATION  2006   Dr. Quincy Simmonds   INCONTINENCE SURGERY     sling for urinary incontinence   LAPAROSCOPIC ENDOMETRIOSIS FULGURATION     ablation   OOPHORECTOMY  2010   w/hyst--Dr. Quincy Simmonds   TUBAL LIGATION  2004   w/ C-section Dr. Quincy Simmonds    TYMPANOPLASTY     right ear   VAGINAL HYSTERECTOMY  05/21/2009   lap assisted /partial    Family History  Problem Relation Age of Onset   Kidney disease Mother        renal failure   Stroke Mother    Diabetes Mother    Hypertension Mother    Heart failure Mother    Colon cancer Mother    Stroke Father    Cancer - Colon Maternal Aunt    Hypertension Sister    Rheum arthritis Sister    Hypertension Brother    Hypertension Sister    Hypertension Sister    Hypertension Sister    Breast cancer Sister    Kidney cancer Sister    Esophageal cancer Neg Hx    Rectal cancer Neg Hx    Stomach cancer Neg Hx    Social History:  reports that she quit smoking about 22 years ago. Her smoking use included cigarettes. She has never used smokeless tobacco. She reports that she does not drink alcohol and does not use drugs.  Allergies:  Allergies  Allergen Reactions   Buspar [Buspirone Hcl]     Worsened her panic attacks  Medications Prior to Admission  Medication Sig Dispense Refill   acetaminophen (TYLENOL) 500 MG tablet Take 1,000 mg by mouth every 6 (six) hours as needed for moderate pain or headache.     betamethasone valerate ointment (VALISONE) 0.1 % Apply 1 application topically 2 (two) times daily. (Patient taking differently: Apply 1 application topically 2 (two) times daily as needed (rash).) 45 g 1   cetirizine (ZYRTEC) 10 MG tablet Take 10 mg by mouth daily.      Cholecalciferol (VITAMIN D3) 125 MCG (5000 UT) CAPS Take 5,000 Units by mouth daily.     Cyanocobalamin (B-12) 100 MCG TABS Take 100 mcg by mouth daily.     hydrochlorothiazide (HYDRODIURIL) 25 MG tablet Take 1 tablet (25 mg total) by mouth daily. 90 tablet 3   LORazepam (ATIVAN) 0.5 MG tablet Take 1 tablet (0.5 mg total) by mouth every 12 (twelve) hours as needed for anxiety. 30 tablet 1   Maca 500 MG CAPS Take 500 mg by mouth daily.     Prenatal Vit-Fe Fumarate-FA (PRENATAL MULTIVITAMIN) TABS tablet Take 1 tablet  by mouth 3 (three) times a week.     meclizine (ANTIVERT) 25 MG tablet Take 25 mg by mouth 3 (three) times daily as needed for dizziness.     naproxen sodium (ALEVE) 220 MG tablet Take 440 mg by mouth 2 (two) times daily as needed (pain).     oxyCODONE (OXY IR/ROXICODONE) 5 MG immediate release tablet Take 1 tablet (5 mg total) by mouth every 6 (six) hours as needed for moderate pain, severe pain or breakthrough pain. (Patient not taking: No sig reported) 10 tablet 0    Results for orders placed or performed during the hospital encounter of 04/07/21 (from the past 48 hour(s))  SARS Coronavirus 2 by RT PCR (hospital order, performed in [facility] hospital lab) Nasopharyngeal Nasopharyngeal Swab     Status: None   Collection Time: 04/07/21  5:47 AM   Specimen: Nasopharyngeal Swab  Result Value Ref Range   SARS Coronavirus 2 NEGATIVE NEGATIVE    Comment: (NOTE) SARS-CoV-2 target nucleic acids are NOT DETECTED.  The SARS-CoV-2 RNA is generally detectable in upper and lower respiratory specimens during the acute phase of infection. The lowest concentration of SARS-CoV-2 viral copies this assay can detect is 250 copies / mL. A negative result does not preclude SARS-CoV-2 infection and should not be used as the sole basis for treatment or other patient management decisions.  A negative result may occur with improper specimen collection / handling, submission of specimen other than nasopharyngeal swab, presence of viral mutation(s) within the areas targeted by this assay, and inadequate number of viral copies (<250 copies / mL). A negative result must be combined with clinical observations, patient history, and epidemiological information.  Fact Sheet for Patients:   StrictlyIdeas.no  Fact Sheet for Healthcare Providers: BankingDealers.co.za  This test is not yet approved or  cleared by the [place] FDA and has been authorized for  detection and/or diagnosis of SARS-CoV-2 by FDA under an Emergency Use Authorization (EUA).  This EUA will remain in effect (meaning this test can be used) for the duration of the COVID-19 declaration under Section 564(b)(1) of the Act, 21 U.S.C. section 360bbb-3(b)(1), unless the authorization is terminated or revoked sooner.  Performed at [facility], [place] [address], [place], [place] [address]   Basic metabolic panel per protocol     Status: Abnormal   Collection Time: 04/07/21  5:53 AM  Result  Value Ref Range   Sodium 139 135 - 145 mmol/L   Potassium 3.8 3.5 - 5.1 mmol/L   Chloride 104 98 - 111 mmol/L   CO2 28 22 - 32 mmol/L   Glucose, Bld 109 (H) 70 - 99 mg/dL    Comment: Glucose reference range applies only to samples taken after fasting for at least 8 hours.   BUN 22 (H) 6 - 20 mg/dL   Creatinine, Ser 0.76 0.44 - 1.00 mg/dL   Calcium 9.2 8.9 - 10.3 mg/dL   GFR, Estimated >60 >60 mL/min    Comment: (NOTE) Calculated using the CKD-EPI Creatinine Equation (2021)    Anion gap 7 5 - 15    Comment: Performed at [facility] [address] [place], [place] [address]  CBC per protocol     Status: Abnormal   Collection Time: 04/07/21  5:53 AM  Result Value Ref Range   WBC 6.2 4.0 - 10.5 K/uL   RBC 4.40 3.87 - 5.11 MIL/uL   Hemoglobin 13.2 12.0 - 15.0 g/dL   HCT 40.6 36.0 - 46.0 %   MCV 92.3 80.0 - 100.0 fL   MCH 30.0 26.0 - 34.0 pg   MCHC 32.5 30.0 - 36.0 g/dL   RDW 12.3 11.5 - 15.5 %   Platelets 130 (L) 150 - 400 K/uL   nRBC 0.0 0.0 - 0.2 %    Comment: Performed at [facility], [place] [address], [place], [place] [address]   No results found.  Review of Systems  Constitutional: Negative.   HENT: Negative.    Eyes: Negative.   Respiratory: Negative.    Cardiovascular: Negative.   Gastrointestinal: Negative.   Endocrine: Negative.   Genitourinary: Negative.   Musculoskeletal:  Positive for back pain.  Allergic/Immunologic: Negative.    Neurological: Negative.   Hematological: Negative.   Psychiatric/Behavioral: Negative.     Blood pressure (!) 183/85, pulse 75, temperature 98.4 F (36.9 C), temperature source Oral, resp. rate 18, height 5' 4.5" (1.638 m), weight 100.7 kg, SpO2 100 %. Physical Exam Constitutional:      Appearance: Normal appearance.  HENT:     Head: Normocephalic and atraumatic.     Nose: Nose normal.     Mouth/Throat:     Mouth: Mucous membranes are moist.  Eyes:     Extraocular Movements: Extraocular movements intact.     Pupils: Pupils are equal, round, and reactive to light.  Cardiovascular:     Rate and Rhythm: Normal rate and regular rhythm.     Pulses: Normal pulses.     Heart sounds: Normal heart sounds.  Pulmonary:     Effort: Pulmonary effort is normal.     Breath sounds: Normal breath sounds.  Abdominal:     General: Abdomen is flat. Bowel sounds are normal.     Palpations: Abdomen is soft.  Musculoskeletal:        General: Normal range of motion.     Cervical back: Normal range of motion and neck supple.  Skin:    General: Skin is warm and dry.     Capillary Refill: Capillary refill takes less than 2 seconds.  Neurological:     General: No focal deficit present.     Mental Status: She is alert.  Psychiatric:        Mood and Affect: Mood normal.        Behavior: Behavior normal.        Thought Content: Thought content normal.  Judgment: Judgment normal.     Assessment/Plan T12 vertebral body biopsy for abnormal bone marrow signal that is progressive at T12.  Earleen Newport, MD 04/07/2021, 7:47 AM

## 2021-04-07 NOTE — Transfer of Care (Signed)
Immediate Anesthesia Transfer of Care Note  Patient: Virginia Oneal  Procedure(s) Performed: Thoracic 12 Vertebral body biopsy (Back)  Patient Location: PACU  Anesthesia Type:General  Level of Consciousness: awake and drowsy  Airway & Oxygen Therapy: Patient Spontanous Breathing and Patient connected to face mask oxygen  Post-op Assessment: Report given to RN and Post -op Vital signs reviewed and stable  Post vital signs: Reviewed and stable  Last Vitals:  Vitals Value Taken Time  BP 147/82 04/07/21 0902  Temp    Pulse 74 04/07/21 0905  Resp 15 04/07/21 0905  SpO2 99 % 04/07/21 0905  Vitals shown include unvalidated device data.  Last Pain:  Vitals:   04/07/21 0634  TempSrc:   PainSc: 0-No pain         Complications: No notable events documented.

## 2021-04-07 NOTE — Anesthesia Postprocedure Evaluation (Signed)
Anesthesia Post Note  Patient: Virginia Oneal  Procedure(s) Performed: Thoracic 12 Vertebral body biopsy (Back)     Patient location during evaluation: PACU Anesthesia Type: General Level of consciousness: awake and alert Pain management: pain level controlled Vital Signs Assessment: post-procedure vital signs reviewed and stable Respiratory status: spontaneous breathing, nonlabored ventilation and respiratory function stable Cardiovascular status: blood pressure returned to baseline and stable Postop Assessment: no apparent nausea or vomiting Anesthetic complications: no   No notable events documented.  Last Vitals:  Vitals:   04/07/21 0917 04/07/21 0932  BP: 119/77 (!) 143/75  Pulse: 73 65  Resp: 14 13  Temp:  (!) 36.1 C  SpO2: 93% 95%    Last Pain:  Vitals:   04/07/21 0932  TempSrc:   PainSc: 0-No pain                 Alynah Schone,W. EDMOND

## 2021-04-07 NOTE — Op Note (Signed)
Date of surgery: 04/07/2021 Preoperative diagnosis: Infiltrative lesion T12 Postoperative diagnosis: Same Procedure: Core bone biopsy and bone marrow aspirate of T12 vertebrae Surgeon: Kristeen Miss Anesthesia: General endotracheal Indications: Virginia Oneal is a 55 year old individual whose had a progressively sclerosing lesion of the T12 vertebrae.  There is no known primary however the patient has been having some increasing pain discomfort in the thoracolumbar region.  A core biopsy and a bone marrow aspirate is now being performed.  Procedure: Patient was brought to the operating room supine on the stretcher.  After the smooth induction of general endotracheal anesthesia she was carefully turned prone.  The back was prepped with alcohol DuraPrep and draped in a sterile fashion and then fluoroscopic guidance was used to localize the T12 area and the left side pedicle was marked.  An entry site was chosen 3 cm lateral to this.  The area was infiltrated with lidocaine and epinephrine and half percent Marcaine in a 50-50 mixture.  A small vertical incision was created and a Jamshidi needle was then placed over the lateral aspect of the pedicle of T12.  By checking fluoroscopic guidance tapped the Jamshidi needle through the outer cortex of the pedicle and into the vertebral body.  An appropriate trajectory was checked with multiple AP and lateral fluoroscopic images.  When the vertebral body was entered the inner cannula of the Jamshidi was removed and a biopsy probe was then tapped into the vertebral body to obtain a singular core.  Once the core was retrieved ASIS second pass with the Jamshidi needle a bit more laterally was obtained and a second core was obtained.  Then bone marrow aspirate of 15 cc of marrow was obtained.  This was sent for specimen fresh to the pathology laboratory where I previously contacted Dr. Saralyn Pilar for handling of the specimen.  The Jamshidi needle was then removed and an additional  10 cc of half percent Marcaine was injected deep into the fascia and the bone and periosteum.  The skin was closed with inverted 4-0 Prolene suture.  Dermabond was placed on the skin patient tolerated procedure well.

## 2021-04-07 NOTE — Anesthesia Procedure Notes (Signed)
Procedure Name: Intubation Date/Time: 04/07/2021 8:08 AM Performed by: Bryson Corona, CRNA Pre-anesthesia Checklist: Patient identified, Emergency Drugs available, Suction available and Patient being monitored Patient Re-evaluated:Patient Re-evaluated prior to induction Oxygen Delivery Method: Circle System Utilized Preoxygenation: Pre-oxygenation with 100% oxygen Induction Type: IV induction Ventilation: Mask ventilation without difficulty Laryngoscope Size: Mac and 3 Grade View: Grade II Tube type: Oral Tube size: 7.0 mm Number of attempts: 1 Airway Equipment and Method: Stylet and Oral airway Placement Confirmation: ETT inserted through vocal cords under direct vision, positive ETCO2 and breath sounds checked- equal and bilateral Secured at: 22 cm Tube secured with: Tape Dental Injury: Teeth and Oropharynx as per pre-operative assessment  Comments: Intubation performed by Brett Albino, EMT student.

## 2021-04-08 ENCOUNTER — Encounter (HOSPITAL_COMMUNITY): Payer: Self-pay | Admitting: Neurological Surgery

## 2021-04-08 LAB — CYTOLOGY - NON PAP

## 2021-04-15 LAB — SURGICAL PATHOLOGY

## 2021-04-24 ENCOUNTER — Encounter: Payer: 59 | Admitting: Primary Care

## 2021-05-01 ENCOUNTER — Other Ambulatory Visit: Payer: Self-pay

## 2021-05-01 ENCOUNTER — Encounter: Payer: Self-pay | Admitting: Primary Care

## 2021-05-01 ENCOUNTER — Ambulatory Visit (INDEPENDENT_AMBULATORY_CARE_PROVIDER_SITE_OTHER): Payer: 59 | Admitting: Primary Care

## 2021-05-01 VITALS — BP 126/88 | HR 76 | Temp 97.4°F | Ht 64.5 in | Wt 223.0 lb

## 2021-05-01 DIAGNOSIS — I10 Essential (primary) hypertension: Secondary | ICD-10-CM | POA: Diagnosis not present

## 2021-05-01 DIAGNOSIS — F419 Anxiety disorder, unspecified: Secondary | ICD-10-CM | POA: Diagnosis not present

## 2021-05-01 DIAGNOSIS — Z0001 Encounter for general adult medical examination with abnormal findings: Secondary | ICD-10-CM

## 2021-05-01 DIAGNOSIS — M899 Disorder of bone, unspecified: Secondary | ICD-10-CM

## 2021-05-01 DIAGNOSIS — Z23 Encounter for immunization: Secondary | ICD-10-CM

## 2021-05-01 DIAGNOSIS — R7303 Prediabetes: Secondary | ICD-10-CM

## 2021-05-01 DIAGNOSIS — R232 Flushing: Secondary | ICD-10-CM | POA: Diagnosis not present

## 2021-05-01 DIAGNOSIS — Z86010 Personal history of colonic polyps: Secondary | ICD-10-CM

## 2021-05-01 DIAGNOSIS — Z0000 Encounter for general adult medical examination without abnormal findings: Secondary | ICD-10-CM | POA: Insufficient documentation

## 2021-05-01 LAB — LIPID PANEL
Cholesterol: 212 mg/dL — ABNORMAL HIGH (ref 0–200)
HDL: 66 mg/dL (ref >39.00)
LDL Cholesterol: 130 mg/dL — ABNORMAL HIGH (ref 0–99)
NonHDL: 145.58
Total CHOL/HDL Ratio: 3
Triglycerides: 79 mg/dL (ref 0.0–149.0)
VLDL: 15.8 mg/dL (ref 0.0–40.0)

## 2021-05-01 LAB — HEMOGLOBIN A1C: Hgb A1c MFr Bld: 5.8 % (ref 4.6–6.5)

## 2021-05-01 LAB — COMPREHENSIVE METABOLIC PANEL
ALT: 23 U/L (ref 0–35)
AST: 22 U/L (ref 0–37)
Albumin: 4.3 g/dL (ref 3.5–5.2)
Alkaline Phosphatase: 103 U/L (ref 39–117)
BUN: 20 mg/dL (ref 6–23)
CO2: 30 mEq/L (ref 19–32)
Calcium: 9.7 mg/dL (ref 8.4–10.5)
Chloride: 104 mEq/L (ref 96–112)
Creatinine, Ser: 0.81 mg/dL (ref 0.40–1.20)
GFR: 80.83 mL/min (ref >60.00)
Glucose, Bld: 100 mg/dL — ABNORMAL HIGH (ref 70–99)
Potassium: 4.5 mEq/L (ref 3.5–5.1)
Sodium: 141 mEq/L (ref 135–145)
Total Bilirubin: 0.4 mg/dL (ref 0.2–1.2)
Total Protein: 7 g/dL (ref 6.0–8.3)

## 2021-05-01 LAB — CBC
HCT: 41.5 % (ref 36.0–46.0)
Hemoglobin: 13.9 g/dL (ref 12.0–15.0)
MCHC: 33.4 g/dL (ref 30.0–36.0)
MCV: 89.9 fl (ref 78.0–100.0)
Platelets: 159 10*3/uL (ref 150.0–400.0)
RBC: 4.62 Mil/uL (ref 3.87–5.11)
RDW: 13.4 % (ref 11.5–15.5)
WBC: 6 10*3/uL (ref 4.0–10.5)

## 2021-05-01 MED ORDER — HYDROXYZINE HCL 10 MG PO TABS: ORAL_TABLET | ORAL | 0 refills | Status: DC

## 2021-05-01 MED ORDER — VENLAFAXINE HCL ER 37.5 MG PO CP24: 37.5000 mg | ORAL_CAPSULE | Freq: Every day | ORAL | 0 refills | Status: DC

## 2021-05-01 NOTE — Progress Notes (Signed)
Subjective:    Patient ID: Virginia Oneal, female    DOB: [birth year], 55 y.o.   MRN: [id number]  HPI  Virginia Oneal is a very pleasant 55 y.o. female who presents today for complete physical and follow up of chronic conditions.  She would also like to discuss hot flashes that occur during the day and evening, difficulty sleeping at night. Also with intermittent anxiety. Chronic for a few years. She's tried OTC treatment without improvement. Hysterectomy in 2010.   Immunizations: -Tetanus: 2020 -Influenza: Due this season  -Covid-19: 3 vaccines  -Shingles: Never completed   Diet: Fair diet.  Exercise: Walks 5 miles daily   Eye exam: Completes annually  Dental exam: Completes semi-annually   Pap Smear: Completed in 2021, hysterectomy  Mammogram: Completed in October 2021 Colonoscopy: Completed in 2019, due 2024  BP Readings from Last 3 Encounters:  05/01/21 126/88  04/07/21 (!) 143/75  01/02/21 (!) 165/87      Review of Systems  Constitutional:  Negative for unexpected weight change.  HENT:  Negative for rhinorrhea.   Respiratory:  Negative for cough and shortness of breath.   Cardiovascular:  Negative for chest pain.  Gastrointestinal:  Negative for constipation and diarrhea.  Genitourinary:  Negative for difficulty urinating.       Hot flashes   Musculoskeletal:  Negative for arthralgias and myalgias.  Skin:  Negative for rash.  Allergic/Immunologic: Negative for environmental allergies.  Neurological:  Negative for dizziness, numbness and headaches.  Psychiatric/Behavioral:  The patient is nervous/anxious.         Past Medical History:  Diagnosis Date   Abnormal uterine bleeding    Anemia    as a teenager   Anxiety    postpartum and hx panic attacks   Arrhythmia    Arthritis    back and hands   Chest mass    by CT scanning   Complication of anesthesia    slow to wake up   COVID-19 10/2020   Depression    Family history of adverse reaction to  anesthesia    mom slow to wake up   Gestational diabetes    resolved after pregnancy   History of kidney stones    Hypertension    Personal history of colonic polyps    PONV (postoperative nausea and vomiting)    Seasonal allergies    Urinary incontinence 05/2009   hx bladder sling--Dr. Quincy Simmonds    Social History   Socioeconomic History   Marital status: Married    Spouse name: Not on file   Number of children: 2   Years of education: Not on file   Highest education level: Not on file  Occupational History   Occupation: [employer]  Tobacco Use   Smoking status: Former    Years: 10.00    Types: Cigarettes    Quit date: 01/06/1999    Years since quitting: 22.3   Smokeless tobacco: Never  Vaping Use   Vaping Use: Never used  Substance and Sexual Activity   Alcohol use: No    Alcohol/week: 0.0 standard drinks   Drug use: No   Sexual activity: Yes    Partners: Male    Birth control/protection: Other-see comments, Surgical    Comment: TAH/LSO  Other Topics Concern   Not on file  Social History Narrative   ** Merged History Encounter **       Social Determinants of Health   Financial Resource Strain: Not on file  Food Insecurity:  Not on file  Transportation Needs: Not on file  Physical Activity: Not on file  Stress: Not on file  Social Connections: Not on file  Intimate Partner Violence: Not on file    Past Surgical History:  Procedure Laterality Date   ABDOMINAL HYSTERECTOMY  05/2009   TAH/LSO   BLADDER SUSPENSION  2010   Dr. Quincy Simmonds   BREAST BIOPSY     left stereo   CESAREAN SECTION  03/2003   CESAREAN SECTION  2004   CHOLECYSTECTOMY N/A 01/02/2021   Procedure: LAPAROSCOPIC CHOLECYSTECTOMY;  Surgeon: Rolm Bookbinder, MD;  Location: [facility];  Service: General;  Laterality: N/A;  58  - GEN AND TAP BLOCK LDOW   COLONOSCOPY  09/08   COLPORRHAPHY  05/2009   Dr. Quincy Simmonds   ENDOMETRIAL ABLATION  2006   Dr. Quincy Simmonds   INCONTINENCE SURGERY     sling for urinary  incontinence   KYPHOPLASTY N/A 04/07/2021   Procedure: Thoracic 12 Vertebral body biopsy;  Surgeon: Kristeen Miss, MD;  Location: [facility];  Service: Neurosurgery;  Laterality: N/A;   LAPAROSCOPIC ENDOMETRIOSIS FULGURATION     ablation   OOPHORECTOMY  2010   w/hyst--Dr. Quincy Simmonds   TUBAL LIGATION  2004   w/ C-section Dr. Quincy Simmonds   TYMPANOPLASTY     right ear   VAGINAL HYSTERECTOMY  05/21/2009   lap assisted /partial    Family History  Problem Relation Age of Onset   Kidney disease Mother        renal failure   Stroke Mother    Diabetes Mother    Hypertension Mother    Heart failure Mother    Colon cancer Mother    Stroke Father    Cancer - Colon Maternal Aunt    Hypertension Sister    Rheum arthritis Sister    Hypertension Brother    Hypertension Sister    Hypertension Sister    Hypertension Sister    Breast cancer Sister    Kidney cancer Sister    Esophageal cancer Neg Hx    Rectal cancer Neg Hx    Stomach cancer Neg Hx     Allergies  Allergen Reactions   Buspar [Buspirone Hcl]     Worsened her panic attacks    Current Outpatient Medications on File Prior to Visit  Medication Sig Dispense Refill   acetaminophen (TYLENOL) 500 MG tablet Take 1,000 mg by mouth every 6 (six) hours as needed for moderate pain or headache.     betamethasone valerate ointment (VALISONE) 0.1 % Apply 1 application topically 2 (two) times daily. (Patient taking differently: Apply 1 application topically 2 (two) times daily as needed (rash).) 45 g 1   cetirizine (ZYRTEC) 10 MG tablet Take 10 mg by mouth daily.      Cholecalciferol (VITAMIN D3) 125 MCG (5000 UT) CAPS Take 5,000 Units by mouth daily.     Cyanocobalamin (B-12) 100 MCG TABS Take 100 mcg by mouth daily.     hydrochlorothiazide (HYDRODIURIL) 25 MG tablet Take 1 tablet (25 mg total) by mouth daily. 90 tablet 3   LORazepam (ATIVAN) 0.5 MG tablet Take 1 tablet (0.5 mg total) by mouth every 12 (twelve) hours as needed for anxiety. 30 tablet 1    Maca 500 MG CAPS Take 500 mg by mouth daily.     naproxen sodium (ALEVE) 220 MG tablet Take 440 mg by mouth 2 (two) times daily as needed (pain).     Prenatal Vit-Fe Fumarate-FA (PRENATAL MULTIVITAMIN) TABS tablet Take 1 tablet  by mouth 3 (three) times a week.     No current facility-administered medications on file prior to visit.    BP 126/88   Pulse 76   Temp (!) 97.4 F (36.3 C) (Temporal)   Ht 5' 4.5" (1.638 m)   Wt 223 lb (101.2 kg)   SpO2 97%   BMI 37.69 kg/m  Objective:   Physical Exam HENT:     Right Ear: Tympanic membrane and ear canal normal.     Left Ear: Tympanic membrane and ear canal normal.     Nose: Nose normal.  Eyes:     Conjunctiva/sclera: Conjunctivae normal.     Pupils: Pupils are equal, round, and reactive to light.  Neck:     Thyroid: No thyromegaly.  Cardiovascular:     Rate and Rhythm: Normal rate and regular rhythm.     Heart sounds: No murmur heard. Pulmonary:     Effort: Pulmonary effort is normal.     Breath sounds: Normal breath sounds. No rales.  Abdominal:     General: Bowel sounds are normal.     Palpations: Abdomen is soft.     Tenderness: There is no abdominal tenderness.  Musculoskeletal:        General: Normal range of motion.     Cervical back: Neck supple.  Lymphadenopathy:     Cervical: No cervical adenopathy.  Skin:    General: Skin is warm and dry.     Findings: No rash.  Neurological:     Mental Status: She is alert and oriented to person, place, and time.     Cranial Nerves: No cranial nerve deficit.     Deep Tendon Reflexes: Reflexes are normal and symmetric.  Psychiatric:        Mood and Affect: Mood normal.          Assessment & Plan:      This visit occurred during the SARS-CoV-2 public health emergency.  Safety protocols were in place, including screening questions prior to the visit, additional usage of staff PPE, and extensive cleaning of exam room while observing appropriate contact time as indicated  for disinfecting solutions.

## 2021-05-01 NOTE — Assessment & Plan Note (Signed)
Well controlled in the office today.  Continue HCTZ 25 mg.   CMP pending.

## 2021-05-01 NOTE — Assessment & Plan Note (Signed)
Noted on prior labs, repeat A1C pending.   Discussed the importance of a healthy diet and regular exercise in order for weight loss, and to reduce the risk of further co-morbidity.

## 2021-05-01 NOTE — Assessment & Plan Note (Signed)
Chronic for years, now occurring during day and evening hours. Trial of venlafaxine ER 37.5 mg sent to pharmacy. This will hopefully help with anxiety.   She will update in a few weeks.

## 2021-05-01 NOTE — Assessment & Plan Note (Signed)
Shingrix due, provided first vaccine today.  Other vaccines UTD.  Pap smear in 2021? Hysterectomy in 2010. Colonoscopy UTD, due in 2024.  Discussed the importance of a healthy diet and regular exercise in order for weight loss, and to reduce the risk of further co-morbidity.  Exam today stable Labs pending.

## 2021-05-01 NOTE — Addendum Note (Signed)
Addended by: Francella Solian on: 05/01/2021 09:18 AM   Modules accepted: Orders

## 2021-05-01 NOTE — Assessment & Plan Note (Signed)
Due for repeat colonoscopy in 2024

## 2021-05-01 NOTE — Assessment & Plan Note (Signed)
Chronic, seems to experience frequent anxiety symptoms, appears anxious today.   Discussed that I typically don't prescribe benzodiazepines. Will be placing her on venalfaxine ER 37.5 mg daily for hot flashes, this should help with anxiety.   Will trial hydroxyzine 10 mg to use PRN. She will update. If she cannot tolerate then will continue lorazepam PRN.

## 2021-05-01 NOTE — Assessment & Plan Note (Signed)
Working with neurosurgery who suspects Paget's disease. Tests pending. Will be repeating MRI soon.

## 2021-05-01 NOTE — Patient Instructions (Addendum)
Stop by the lab prior to leaving today. I will notify you of your results once received.   Start venlafaxine ER 37.5 mg once daily with breakfast for hot flashes and anxiety.   You may take the hydroxyzine 10 mg tablets as needed for anxiety. This may cause drowsiness.   Schedule a nurse visit to return for your second shingles vaccine in 2-6 months.   It was a pleasure to see you today!  Preventive Care 2-57 Years Old, Female Preventive care refers to lifestyle choices and visits with your health care provider that can promote health and wellness. This includes: A yearly physical exam. This is also called an annual wellness visit. Regular dental and eye exams. Immunizations. Screening for certain conditions. Healthy lifestyle choices, such as: Eating a healthy diet. Getting regular exercise. Not using drugs or products that contain nicotine and tobacco. Limiting alcohol use. What can I expect for my preventive care visit? Physical exam Your health care provider will check your: Height and weight. These may be used to calculate your BMI (body mass index). BMI is a measurement that tells if you are at a healthy weight. Heart rate and blood pressure. Body temperature. Skin for abnormal spots. Counseling Your health care provider may ask you questions about your: Past medical problems. Family's medical history. Alcohol, tobacco, and drug use. Emotional well-being. Home life and relationship well-being. Sexual activity. Diet, exercise, and sleep habits. Work and work Statistician. Access to firearms. Method of birth control. Menstrual cycle. Pregnancy history. What immunizations do I need?  Vaccines are usually given at various ages, according to a schedule. Your health care provider will recommend vaccines for you based on your age, medicalhistory, and lifestyle or other factors, such as travel or where you work. What tests do I need? Blood tests Lipid and cholesterol  levels. These may be checked every 5 years, or more often if you are over 87 years old. Hepatitis C test. Hepatitis B test. Screening Lung cancer screening. You may have this screening every year starting at age 15 if you have a 30-pack-year history of smoking and currently smoke or have quit within the past 15 years. Colorectal cancer screening. All adults should have this screening starting at age 32 and continuing until age 13. Your health care provider may recommend screening at age 98 if you are at increased risk. You will have tests every 1-10 years, depending on your results and the type of screening test. Diabetes screening. This is done by checking your blood sugar (glucose) after you have not eaten for a while (fasting). You may have this done every 1-3 years. Mammogram. This may be done every 1-2 years. Talk with your health care provider about when you should start having regular mammograms. This may depend on whether you have a family history of breast cancer. BRCA-related cancer screening. This may be done if you have a family history of breast, ovarian, tubal, or peritoneal cancers. Pelvic exam and Pap test. This may be done every 3 years starting at age 88. Starting at age 48, this may be done every 5 years if you have a Pap test in combination with an HPV test. Other tests STD (sexually transmitted disease) testing, if you are at risk. Bone density scan. This is done to screen for osteoporosis. You may have this scan if you are at high risk for osteoporosis. Talk with your health care provider about your test results, treatment options,and if necessary, the need for more tests. Follow these  instructions at home: Eating and drinking  Eat a diet that includes fresh fruits and vegetables, whole grains, lean protein, and low-fat dairy products. Take vitamin and mineral supplements as recommended by your health care provider. Do not drink alcohol if: Your health care provider  tells you not to drink. You are pregnant, may be pregnant, or are planning to become pregnant. If you drink alcohol: Limit how much you have to 0-1 drink a day. Be aware of how much alcohol is in your drink. In the [place] one drink equals one 12 oz bottle of beer (355 mL), one 5 oz glass of wine (148 mL), or one 1 oz glass of hard liquor (44 mL).  Lifestyle Take daily care of your teeth and gums. Brush your teeth every morning and night with fluoride toothpaste. Floss one time each day. Stay active. Exercise for at least 30 minutes 5 or more days each week. Do not use any products that contain nicotine or tobacco, such as cigarettes, e-cigarettes, and chewing tobacco. If you need help quitting, ask your health care provider. Do not use drugs. If you are sexually active, practice safe sex. Use a condom or other form of protection to prevent STIs (sexually transmitted infections). If you do not wish to become pregnant, use a form of birth control. If you plan to become pregnant, see your health care provider for a prepregnancy visit. If told by your health care provider, take low-dose aspirin daily starting at age 65. Find healthy ways to cope with stress, such as: Meditation, yoga, or listening to music. Journaling. Talking to a trusted person. Spending time with friends and family. Safety Always wear your seat belt while driving or riding in a vehicle. Do not drive: If you have been drinking alcohol. Do not ride with someone who has been drinking. When you are tired or distracted. While texting. Wear a helmet and other protective equipment during sports activities. If you have firearms in your house, make sure you follow all gun safety procedures. What's next? Visit your health care provider once a year for an annual wellness visit. Ask your health care provider how often you should have your eyes and teeth checked. Stay up to date on all vaccines. This information is not intended to  replace advice given to you by your health care provider. Make sure you discuss any questions you have with your healthcare provider. Document Revised: 07/02/2020 Document Reviewed: 06/09/2018 Elsevier Patient Education  2022 [employer].

## 2021-05-08 ENCOUNTER — Other Ambulatory Visit: Payer: Self-pay | Admitting: Primary Care

## 2021-05-08 DIAGNOSIS — F419 Anxiety disorder, unspecified: Secondary | ICD-10-CM

## 2021-05-08 NOTE — Telephone Encounter (Signed)
Pharmacy requesting 90 day supply

## 2021-05-09 NOTE — Telephone Encounter (Signed)
The medication requested is a PRN medication for anxiety. Did she try it? Does she actually want more?

## 2021-05-12 DIAGNOSIS — I10 Essential (primary) hypertension: Secondary | ICD-10-CM

## 2021-05-12 NOTE — Telephone Encounter (Signed)
Called patient she did not request. She has not taken that at all yet.

## 2021-05-13 MED ORDER — HYDROCHLOROTHIAZIDE 25 MG PO TABS: 25.0000 mg | ORAL_TABLET | Freq: Every day | ORAL | 3 refills | Status: DC

## 2021-05-13 NOTE — Telephone Encounter (Signed)
We need to send her labs to her neurosurgeon, Dr. Ellene Route. Can you help?

## 2021-05-14 ENCOUNTER — Other Ambulatory Visit: Payer: Self-pay | Admitting: Primary Care

## 2021-05-14 DIAGNOSIS — F419 Anxiety disorder, unspecified: Secondary | ICD-10-CM

## 2021-05-14 DIAGNOSIS — R232 Flushing: Secondary | ICD-10-CM

## 2021-05-14 NOTE — Telephone Encounter (Signed)
Labs have been sent no further acton needed.

## 2021-06-13 ENCOUNTER — Other Ambulatory Visit: Payer: Self-pay | Admitting: Neurological Surgery

## 2021-06-13 DIAGNOSIS — M899 Disorder of bone, unspecified: Secondary | ICD-10-CM

## 2021-07-04 ENCOUNTER — Ambulatory Visit
Admission: RE | Admit: 2021-07-04 | Discharge: 2021-07-04 | Disposition: A | Discharge status: Home / Self Care | Payer: 59 | Source: Ambulatory Visit | Attending: Neurological Surgery | Admitting: Neurological Surgery

## 2021-07-04 ENCOUNTER — Other Ambulatory Visit: Payer: Self-pay

## 2021-07-04 DIAGNOSIS — M899 Disorder of bone, unspecified: Secondary | ICD-10-CM

## 2021-07-05 ENCOUNTER — Other Ambulatory Visit: Payer: 59

## 2021-07-14 ENCOUNTER — Other Ambulatory Visit: Payer: Self-pay | Admitting: Neurological Surgery

## 2021-07-18 ENCOUNTER — Ambulatory Visit: Payer: 59 | Attending: Internal Medicine

## 2021-07-18 DIAGNOSIS — Z23 Encounter for immunization: Secondary | ICD-10-CM

## 2021-07-18 NOTE — Progress Notes (Signed)
   Cov[facility] Vaccination [facility]  Name:  Verlena Marlette    MRN: [id number] DOB: 1966-06-26  07/18/2021  Ms. Gockel was observed post Covid-19 immunization for 15 minutes without incident. She was provided with Vaccine Information Sheet and instruction to access the V-Safe system.   Ms. Santizo was instructed to call 911 with any severe reactions post vaccine: Difficulty breathing  Swelling of face and throat  A fast heartbeat  A bad rash all over body  Dizziness and weakness

## 2021-07-25 ENCOUNTER — Other Ambulatory Visit (HOSPITAL_BASED_OUTPATIENT_CLINIC_OR_DEPARTMENT_OTHER): Payer: Self-pay

## 2021-07-25 MED ORDER — MODERNA COVID-19 BIVAL BOOSTER 50 MCG/0.5ML IM SUSP
INTRAMUSCULAR | 0 refills | Status: DC
  Filled 2021-07-25: qty 0.5, 1d supply, fill #0

## 2021-07-25 MED ORDER — INFLUENZA VAC SPLIT QUAD 0.5 ML IM SUSY
PREFILLED_SYRINGE | INTRAMUSCULAR | 0 refills | Status: DC
Start: 2021-07-25 — End: 2022-03-03
  Filled 2021-07-25: qty 0.5, 1d supply, fill #0

## 2021-07-25 NOTE — Pre-Procedure Instructions (Signed)
Surgical Instructions    Your procedure is scheduled on Thursday 07/31/21.   Report to [facility] Main Entrance "A" at 05:30 A.M., then check in with the Admitting office.  Call this number if you have problems the morning of surgery:  [phone]   If you have any questions prior to your surgery date call [phone]: Open Monday-Friday 8am-4pm    Remember:  Do not eat after midnight the night before your surgery  You may drink clear liquids until 04:30 A.M. the morning of your surgery.   Clear liquids allowed are: Water, Non-Citrus Juices (without pulp), Carbonated Beverages, Clear Tea, Black Coffee ONLY (NO MILK, CREAM OR POWDERED CREAMER of any kind), and Gatorade    Take these medicines the morning of surgery with A SIP OF WATER   cetirizine (ZYRTEC)   venlafaxine XR (EFFEXOR-XR)     Take these medicines if needed:   acetaminophen (TYLENOL)   hydrOXYzine (ATARAX/VISTARIL)  LORazepam (ATIVAN)   As of today, STOP taking any Aspirin (unless otherwise instructed by your surgeon) Aleve, Naproxen, Ibuprofen, Motrin, Advil, Goody's, BC's, all herbal medications, fish oil, and all vitamins.     After your COVID test   You are not required to quarantine however you are required to wear a well-fitting mask when you are out and around people not in your household.  If your mask becomes wet or soiled, replace with a new one.  Wash your hands often with soap and water for 20 seconds or clean your hands with an alcohol-based hand sanitizer that contains at least 60% alcohol.  Do not share personal items.  Notify your provider: if you are in close contact with someone who has COVID  or if you develop a fever of 100.4 or greater, sneezing, cough, sore throat, shortness of breath or body aches.             Do not wear jewelry or makeup Do not wear lotions, powders, perfumes/colognes, or deodorant. Do not shave 48 hours prior to surgery.  Men may shave face and neck. Do not  bring valuables to the hospital. DO Not wear nail polish, gel polish, artificial nails, or any other type of covering on natural nails including finger and toenails. If patients have artificial nails, gel coating, etc. that need to be removed by a nail salon, please have this removed prior to surgery or surgery may need to be canceled/delayed if the surgeon/ anesthesia feels like the patient is unable to be adequately monitored.             [facility] is not responsible for any belongings or valuables.  Do NOT Smoke (Tobacco/Vaping)  24 hours prior to your procedure  If you use a CPAP at night, you may bring your mask for your overnight stay.   Contacts, glasses, hearing aids, dentures or partials may not be worn into surgery, please bring cases for these belongings   For patients admitted to the hospital, discharge time will be determined by your treatment team.   Patients discharged the day of surgery will not be allowed to drive home, and someone needs to stay with them for 24 hours.  NO VISITORS WILL BE ALLOWED IN PRE-OP WHERE PATIENTS ARE PREPPED FOR SURGERY.  ONLY 1 SUPPORT PERSON MAY BE PRESENT IN THE WAITING ROOM WHILE YOU ARE IN SURGERY.  IF YOU ARE TO BE ADMITTED, ONCE YOU ARE IN YOUR ROOM YOU WILL BE ALLOWED TWO (2) VISITORS. 1 (ONE) VISITOR MAY STAY OVERNIGHT BUT  MUST ARRIVE TO THE ROOM BY 8pm.  Minor children may have two parents present. Special consideration for safety and communication needs will be reviewed on a case by case basis.  Special instructions:    Oral Hygiene is also important to reduce your risk of infection.  Remember - BRUSH YOUR TEETH THE MORNING OF SURGERY WITH YOUR REGULAR TOOTHPASTE   [facility]- Preparing For Surgery  Before surgery, you can play an important role. Because skin is not sterile, your skin needs to be as free of germs as possible. You can reduce the number of germs on your skin by washing with CHG (chlorahexidine gluconate) Soap before  surgery.  CHG is an antiseptic cleaner which kills germs and bonds with the skin to continue killing germs even after washing.     Please do not use if you have an allergy to CHG or antibacterial soaps. If your skin becomes reddened/irritated stop using the CHG.  Do not shave (including legs and underarms) for at least 48 hours prior to first CHG shower. It is OK to shave your face.  Please follow these instructions carefully.     Shower the NIGHT BEFORE SURGERY and the MORNING OF SURGERY with CHG Soap.   If you chose to wash your hair, wash your hair first as usual with your normal shampoo. After you shampoo, rinse your hair and body thoroughly to remove the shampoo.  Then [employer] and genitals (private parts) with your normal soap and rinse thoroughly to remove soap.  After that Use CHG Soap as you would any other liquid soap. You can apply CHG directly to the skin and wash gently with a scrungie or a clean washcloth.   Apply the CHG Soap to your body ONLY FROM THE NECK DOWN.  Do not use on open wounds or open sores. Avoid contact with your eyes, ears, mouth and genitals (private parts). Wash Face and genitals (private parts)  with your normal soap.   Wash thoroughly, paying special attention to the area where your surgery will be performed.  Thoroughly rinse your body with warm water from the neck down.  DO NOT shower/wash with your normal soap after using and rinsing off the CHG Soap.  Pat yourself dry with a CLEAN TOWEL.  Wear CLEAN PAJAMAS to bed the night before surgery  Place CLEAN SHEETS on your bed the night before your surgery  DO NOT SLEEP WITH PETS.   Day of Surgery:  Take a shower with CHG soap. Wear Clean/Comfortable clothing the morning of surgery Do not apply any deodorants/lotions.   Remember to brush your teeth WITH YOUR REGULAR TOOTHPASTE.   Please read over the following fact sheets that you were given.

## 2021-07-28 ENCOUNTER — Encounter (HOSPITAL_COMMUNITY)
Admission: RE | Admit: 2021-07-28 | Discharge: 2021-07-28 | Disposition: A | Discharge status: Home / Self Care | Payer: 59 | Source: Ambulatory Visit | Attending: Neurological Surgery | Admitting: Neurological Surgery

## 2021-07-28 ENCOUNTER — Other Ambulatory Visit: Payer: Self-pay

## 2021-07-28 ENCOUNTER — Encounter (HOSPITAL_COMMUNITY): Payer: Self-pay

## 2021-07-28 DIAGNOSIS — Z01812 Encounter for preprocedural laboratory examination: Secondary | ICD-10-CM | POA: Diagnosis present

## 2021-07-28 DIAGNOSIS — Z20822 Contact with and (suspected) exposure to covid-19: Secondary | ICD-10-CM | POA: Insufficient documentation

## 2021-07-28 LAB — CBC
HCT: 41.3 % (ref 36.0–46.0)
Hemoglobin: 13.6 g/dL (ref 12.0–15.0)
MCH: 30.6 pg (ref 26.0–34.0)
MCHC: 32.9 g/dL (ref 30.0–36.0)
MCV: 92.8 fL (ref 80.0–100.0)
Platelets: 168 10*3/uL (ref 150–400)
RBC: 4.45 MIL/uL (ref 3.87–5.11)
RDW: 12.5 % (ref 11.5–15.5)
WBC: 6.3 10*3/uL (ref 4.0–10.5)
nRBC: 0 % (ref 0.0–0.2)

## 2021-07-28 LAB — GLUCOSE, CAPILLARY: Glucose-Capillary: 103 mg/dL — ABNORMAL HIGH (ref 70–99)

## 2021-07-28 LAB — BASIC METABOLIC PANEL
Anion gap: 4 — ABNORMAL LOW (ref 5–15)
BUN: 18 mg/dL (ref 6–20)
CO2: 30 mmol/L (ref 22–32)
Calcium: 9.3 mg/dL (ref 8.9–10.3)
Chloride: 103 mmol/L (ref 98–111)
Creatinine, Ser: 0.57 mg/dL (ref 0.44–1.00)
GFR, Estimated: >60 mL/min (ref >60)
Glucose, Bld: 102 mg/dL — ABNORMAL HIGH (ref 70–99)
Potassium: 4.4 mmol/L (ref 3.5–5.1)
Sodium: 137 mmol/L (ref 135–145)

## 2021-07-28 LAB — SURGICAL PCR SCREEN
MRSA, PCR: NEGATIVE
Staphylococcus aureus: POSITIVE — AB

## 2021-07-28 LAB — HEMOGLOBIN A1C
Hgb A1c MFr Bld: 5.7 % — ABNORMAL HIGH (ref 4.8–5.6)
Mean Plasma Glucose: 116.89 mg/dL

## 2021-07-28 LAB — SARS CORONAVIRUS 2 (TAT 6-24 HRS): SARS Coronavirus 2: NEGATIVE

## 2021-07-28 NOTE — Progress Notes (Signed)
PCP - Alma Friendly, NP Cardiologist - denies  PPM/ICD - denies   Chest x-ray - denies EKG - 12/30/20 Stress Test - 08/31/14 ECHO - 08/31/14 Cardiac Cath - denies  Sleep Study - denies   DM- denies  Blood Thinner Instructions: n/a Aspirin Instructions: n/a  ERAS Protcol - yes, no drink   COVID TEST- 07/28/21 at PAT appt   Anesthesia review: No  Patient denies shortness of breath, fever, cough and chest pain at PAT appointment   All instructions explained to the patient, with a verbal understanding of the material. Patient agrees to go over the instructions while at home for a better understanding. Patient also instructed to wear a mask in public after being tested for COVID-19. The opportunity to ask questions was provided.

## 2021-07-29 ENCOUNTER — Other Ambulatory Visit (HOSPITAL_BASED_OUTPATIENT_CLINIC_OR_DEPARTMENT_OTHER): Payer: Self-pay

## 2021-07-30 NOTE — Anesthesia Preprocedure Evaluation (Addendum)
Anesthesia Evaluation  Patient identified by MRN, date of birth, ID band Patient awake    Reviewed: Allergy & Precautions, H&P , NPO status , Patient's Chart, lab work & pertinent test results  History of Anesthesia Complications (+) PONV, Family history of anesthesia reaction and history of anesthetic complications  Airway Mallampati: II  TM Distance: >3 FB Neck ROM: Full    Dental no notable dental hx. (+) Dental Advisory Given   Pulmonary neg pulmonary ROS, former smoker,    Pulmonary exam normal        Cardiovascular hypertension, Pt. on medications + DOE  Normal cardiovascular exam     Neuro/Psych Anxiety Depression negative neurological ROS     GI/Hepatic negative GI ROS, Neg liver ROS,   Endo/Other  diabetes, GestationalMorbid obesity  Renal/GU negative Renal ROS  negative genitourinary   Musculoskeletal  (+) Arthritis , Osteoarthritis,    Abdominal   Peds  Hematology  (+) Blood dyscrasia, anemia ,   Anesthesia Other Findings   Reproductive/Obstetrics negative OB ROS                            Anesthesia Physical  Anesthesia Plan  ASA: 3  Anesthesia Plan: General   Post-op Pain Management:    Induction: Intravenous  PONV Risk Score and Plan: 4 or greater and Ondansetron, Dexamethasone, Midazolam and Aprepitant  Airway Management Planned: Oral ETT  Additional Equipment:   Intra-op Plan:   Post-operative Plan: Extubation in OR  Informed Consent: I have reviewed the patients History and Physical, chart, labs and discussed the procedure including the risks, benefits and alternatives for the proposed anesthesia with the patient or authorized representative who has indicated his/her understanding and acceptance.     Dental advisory given  Plan Discussed with: Anesthesiologist, CRNA and Surgeon  Anesthesia Plan Comments:        Anesthesia Quick Evaluation

## 2021-07-31 ENCOUNTER — Encounter (HOSPITAL_COMMUNITY)
Admission: RE | Disposition: A | Discharge status: Home / Self Care | Payer: Self-pay | Source: Home / Self Care | Attending: Neurological Surgery

## 2021-07-31 ENCOUNTER — Observation Stay (HOSPITAL_COMMUNITY)
Admission: RE | Admit: 2021-07-31 | Discharge: 2021-07-31 | Disposition: A | Discharge status: Home / Self Care | Payer: 59 | Attending: Neurological Surgery | Admitting: Neurological Surgery

## 2021-07-31 ENCOUNTER — Ambulatory Visit (HOSPITAL_COMMUNITY): Payer: 59

## 2021-07-31 ENCOUNTER — Other Ambulatory Visit: Payer: Self-pay

## 2021-07-31 ENCOUNTER — Encounter (HOSPITAL_COMMUNITY): Payer: Self-pay | Admitting: Neurological Surgery

## 2021-07-31 ENCOUNTER — Ambulatory Visit (HOSPITAL_COMMUNITY): Payer: 59 | Admitting: Anesthesiology

## 2021-07-31 DIAGNOSIS — D334 Benign neoplasm of spinal cord: Principal | ICD-10-CM | POA: Insufficient documentation

## 2021-07-31 DIAGNOSIS — Z79899 Other long term (current) drug therapy: Secondary | ICD-10-CM | POA: Diagnosis not present

## 2021-07-31 DIAGNOSIS — I10 Essential (primary) hypertension: Secondary | ICD-10-CM | POA: Insufficient documentation

## 2021-07-31 DIAGNOSIS — Z419 Encounter for procedure for purposes other than remedying health state, unspecified: Secondary | ICD-10-CM

## 2021-07-31 DIAGNOSIS — Z87891 Personal history of nicotine dependence: Secondary | ICD-10-CM | POA: Insufficient documentation

## 2021-07-31 DIAGNOSIS — D492 Neoplasm of unspecified behavior of bone, soft tissue, and skin: Secondary | ICD-10-CM | POA: Diagnosis present

## 2021-07-31 DIAGNOSIS — Z8616 Personal history of COVID-19: Secondary | ICD-10-CM | POA: Insufficient documentation

## 2021-07-31 HISTORY — PX: LUMBAR LAMINECTOMY/DECOMPRESSION MICRODISCECTOMY: SHX5026

## 2021-07-31 LAB — GLUCOSE, CAPILLARY: Glucose-Capillary: 118 mg/dL — ABNORMAL HIGH (ref 70–99)

## 2021-07-31 SURGERY — LUMBAR LAMINECTOMY/DECOMPRESSION MICRODISCECTOMY 1 LEVEL
Anesthesia: General | Site: Back

## 2021-07-31 MED ORDER — ACETAMINOPHEN 500 MG PO TABS: 1000.0000 mg | ORAL_TABLET | Freq: Four times a day (QID) | ORAL | Status: DC | PRN

## 2021-07-31 MED ORDER — METHOCARBAMOL 1000 MG/10ML IJ SOLN
500.0000 mg | Freq: Four times a day (QID) | INTRAVENOUS | Status: DC | PRN
  Filled 2021-07-31: qty 5

## 2021-07-31 MED ORDER — DEXAMETHASONE SODIUM PHOSPHATE 10 MG/ML IJ SOLN
INTRAMUSCULAR | Status: DC | PRN
  Administered 2021-07-31: 10 mg via INTRAVENOUS

## 2021-07-31 MED ORDER — MIDAZOLAM HCL 2 MG/2ML IJ SOLN
INTRAMUSCULAR | Status: AC
  Filled 2021-07-31: qty 2

## 2021-07-31 MED ORDER — SENNA 8.6 MG PO TABS: 1.0000 | ORAL_TABLET | Freq: Two times a day (BID) | ORAL | Status: DC

## 2021-07-31 MED ORDER — MORPHINE SULFATE (PF) 2 MG/ML IV SOLN: 2.0000 mg | INTRAVENOUS | Status: DC | PRN

## 2021-07-31 MED ORDER — ACETAMINOPHEN 325 MG PO TABS: 650.0000 mg | ORAL_TABLET | ORAL | Status: DC | PRN

## 2021-07-31 MED ORDER — BUPIVACAINE HCL (PF) 0.5 % IJ SOLN
INTRAMUSCULAR | Status: DC | PRN
  Administered 2021-07-31: 5 mL
  Administered 2021-07-31: 25 mL

## 2021-07-31 MED ORDER — CEFAZOLIN SODIUM-DEXTROSE 2-4 GM/100ML-% IV SOLN
2.0000 g | Freq: Three times a day (TID) | INTRAVENOUS | Status: DC
  Administered 2021-07-31: 2 g via INTRAVENOUS
  Filled 2021-07-31: qty 100

## 2021-07-31 MED ORDER — LIDOCAINE 2% (20 MG/ML) 5 ML SYRINGE
INTRAMUSCULAR | Status: DC | PRN
Start: 2021-07-31 — End: 2021-07-31
  Administered 2021-07-31: 100 mg via INTRAVENOUS

## 2021-07-31 MED ORDER — MENTHOL 3 MG MT LOZG: 1.0000 | LOZENGE | OROMUCOSAL | Status: DC | PRN

## 2021-07-31 MED ORDER — CHLORHEXIDINE GLUCONATE CLOTH 2 % EX PADS: 6.0000 | MEDICATED_PAD | Freq: Once | CUTANEOUS | Status: DC

## 2021-07-31 MED ORDER — SODIUM CHLORIDE 0.9% FLUSH: 3.0000 mL | INTRAVENOUS | Status: DC | PRN

## 2021-07-31 MED ORDER — LORATADINE 10 MG PO TABS
10.0000 mg | ORAL_TABLET | Freq: Every day | ORAL | Status: DC
  Filled 2021-07-31: qty 1

## 2021-07-31 MED ORDER — KETOROLAC TROMETHAMINE 15 MG/ML IJ SOLN
15.0000 mg | Freq: Four times a day (QID) | INTRAMUSCULAR | Status: DC
  Administered 2021-07-31 (×2): 15 mg via INTRAVENOUS
  Filled 2021-07-31 (×2): qty 1

## 2021-07-31 MED ORDER — CELECOXIB 200 MG PO CAPS: 200.0000 mg | ORAL_CAPSULE | Freq: Once | ORAL | Status: AC

## 2021-07-31 MED ORDER — POLYETHYLENE GLYCOL 3350 17 G PO PACK: 17.0000 g | PACK | Freq: Every day | ORAL | Status: DC | PRN

## 2021-07-31 MED ORDER — HYDROCHLOROTHIAZIDE 25 MG PO TABS
25.0000 mg | ORAL_TABLET | Freq: Every day | ORAL | Status: DC
  Administered 2021-07-31: 25 mg via ORAL
  Filled 2021-07-31: qty 1

## 2021-07-31 MED ORDER — AMISULPRIDE (ANTIEMETIC) 5 MG/2ML IV SOLN: 10.0000 mg | Freq: Once | INTRAVENOUS | Status: DC | PRN

## 2021-07-31 MED ORDER — SODIUM CHLORIDE 0.9% FLUSH
3.0000 mL | Freq: Two times a day (BID) | INTRAVENOUS | Status: DC
  Administered 2021-07-31: 3 mL via INTRAVENOUS

## 2021-07-31 MED ORDER — THROMBIN 5000 UNITS EX SOLR
CUTANEOUS | Status: AC
  Filled 2021-07-31: qty 5000

## 2021-07-31 MED ORDER — PHENYLEPHRINE HCL (PRESSORS) 10 MG/ML IV SOLN
INTRAVENOUS | Status: DC | PRN
  Administered 2021-07-31 (×3): 80 ug via INTRAVENOUS

## 2021-07-31 MED ORDER — ACETAMINOPHEN 500 MG PO TABS
ORAL_TABLET | ORAL | Status: AC
  Administered 2021-07-31: 1000 mg via ORAL
  Filled 2021-07-31: qty 2

## 2021-07-31 MED ORDER — 0.9 % SODIUM CHLORIDE (POUR BTL) OPTIME
TOPICAL | Status: DC | PRN
  Administered 2021-07-31: 1000 mL

## 2021-07-31 MED ORDER — EPHEDRINE SULFATE-NACL 50-0.9 MG/10ML-% IV SOSY
PREFILLED_SYRINGE | INTRAVENOUS | Status: DC | PRN
  Administered 2021-07-31: 5 mg via INTRAVENOUS

## 2021-07-31 MED ORDER — ONDANSETRON HCL 4 MG PO TABS: 4.0000 mg | ORAL_TABLET | Freq: Four times a day (QID) | ORAL | Status: DC | PRN

## 2021-07-31 MED ORDER — PHENYLEPHRINE HCL-NACL 20-0.9 MG/250ML-% IV SOLN
INTRAVENOUS | Status: AC
  Filled 2021-07-31: qty 250

## 2021-07-31 MED ORDER — FENTANYL CITRATE (PF) 250 MCG/5ML IJ SOLN
INTRAMUSCULAR | Status: AC
  Filled 2021-07-31: qty 5

## 2021-07-31 MED ORDER — OXYCODONE-ACETAMINOPHEN 5-325 MG PO TABS: 1.0000 | ORAL_TABLET | ORAL | Status: DC | PRN

## 2021-07-31 MED ORDER — ACETAMINOPHEN 650 MG RE SUPP: 650.0000 mg | RECTAL | Status: DC | PRN

## 2021-07-31 MED ORDER — ROCURONIUM BROMIDE 10 MG/ML (PF) SYRINGE
PREFILLED_SYRINGE | INTRAVENOUS | Status: DC | PRN
  Administered 2021-07-31: 80 mg via INTRAVENOUS
  Administered 2021-07-31: 20 mg via INTRAVENOUS

## 2021-07-31 MED ORDER — VENLAFAXINE HCL ER 37.5 MG PO CP24
37.5000 mg | ORAL_CAPSULE | Freq: Every day | ORAL | Status: DC
  Filled 2021-07-31: qty 1

## 2021-07-31 MED ORDER — PROMETHAZINE HCL 25 MG/ML IJ SOLN: 6.2500 mg | INTRAMUSCULAR | Status: DC | PRN

## 2021-07-31 MED ORDER — SUGAMMADEX SODIUM 200 MG/2ML IV SOLN
INTRAVENOUS | Status: DC | PRN
  Administered 2021-07-31: 200 mg via INTRAVENOUS

## 2021-07-31 MED ORDER — CEFAZOLIN SODIUM-DEXTROSE 2-4 GM/100ML-% IV SOLN
2.0000 g | INTRAVENOUS | Status: AC
Start: 2021-07-31 — End: 2021-07-31
  Administered 2021-07-31: 2 g via INTRAVENOUS

## 2021-07-31 MED ORDER — CHLORHEXIDINE GLUCONATE 0.12 % MT SOLN
15.0000 mL | Freq: Once | OROMUCOSAL | Status: AC
  Administered 2021-07-31: 15 mL via OROMUCOSAL
  Filled 2021-07-31: qty 15

## 2021-07-31 MED ORDER — FENTANYL CITRATE (PF) 100 MCG/2ML IJ SOLN: 25.0000 ug | INTRAMUSCULAR | Status: DC | PRN

## 2021-07-31 MED ORDER — KETOROLAC TROMETHAMINE 10 MG PO TABS: 10.0000 mg | ORAL_TABLET | Freq: Four times a day (QID) | ORAL | 0 refills | Status: DC | PRN

## 2021-07-31 MED ORDER — MIDAZOLAM HCL 5 MG/5ML IJ SOLN
INTRAMUSCULAR | Status: DC | PRN
  Administered 2021-07-31: 2 mg via INTRAVENOUS

## 2021-07-31 MED ORDER — LACTATED RINGERS IV SOLN: INTRAVENOUS | Status: DC

## 2021-07-31 MED ORDER — APREPITANT 40 MG PO CAPS: 40.0000 mg | ORAL_CAPSULE | Freq: Once | ORAL | Status: AC

## 2021-07-31 MED ORDER — PROPOFOL 10 MG/ML IV BOLUS
INTRAVENOUS | Status: AC
  Filled 2021-07-31: qty 40

## 2021-07-31 MED ORDER — HYDROCODONE-ACETAMINOPHEN 5-325 MG PO TABS: 1.0000 | ORAL_TABLET | Freq: Four times a day (QID) | ORAL | 0 refills | Status: DC | PRN

## 2021-07-31 MED ORDER — APREPITANT 40 MG PO CAPS
ORAL_CAPSULE | ORAL | Status: AC
  Administered 2021-07-31: 40 mg via ORAL
  Filled 2021-07-31: qty 1

## 2021-07-31 MED ORDER — DOCUSATE SODIUM 100 MG PO CAPS: 100.0000 mg | ORAL_CAPSULE | Freq: Two times a day (BID) | ORAL | Status: DC

## 2021-07-31 MED ORDER — THROMBIN 5000 UNITS EX SOLR
OROMUCOSAL | Status: DC | PRN
  Administered 2021-07-31: 5 mL via TOPICAL

## 2021-07-31 MED ORDER — PHENOL 1.4 % MT LIQD: 1.0000 | OROMUCOSAL | Status: DC | PRN

## 2021-07-31 MED ORDER — BISACODYL 10 MG RE SUPP: 10.0000 mg | Freq: Every day | RECTAL | Status: DC | PRN

## 2021-07-31 MED ORDER — CEFAZOLIN SODIUM-DEXTROSE 2-4 GM/100ML-% IV SOLN
INTRAVENOUS | Status: AC
  Filled 2021-07-31: qty 100

## 2021-07-31 MED ORDER — SODIUM CHLORIDE 0.9 % IV SOLN: 250.0000 mL | INTRAVENOUS | Status: DC

## 2021-07-31 MED ORDER — FENTANYL CITRATE (PF) 100 MCG/2ML IJ SOLN
INTRAMUSCULAR | Status: DC | PRN
  Administered 2021-07-31: 100 ug via INTRAVENOUS

## 2021-07-31 MED ORDER — ONDANSETRON HCL 4 MG/2ML IJ SOLN: 4.0000 mg | Freq: Four times a day (QID) | INTRAMUSCULAR | Status: DC | PRN

## 2021-07-31 MED ORDER — LIDOCAINE-EPINEPHRINE 1 %-1:100000 IJ SOLN
INTRAMUSCULAR | Status: AC
  Filled 2021-07-31: qty 1

## 2021-07-31 MED ORDER — PROPOFOL 10 MG/ML IV BOLUS
INTRAVENOUS | Status: DC | PRN
  Administered 2021-07-31: 60 mg via INTRAVENOUS
  Administered 2021-07-31: 100 mg via INTRAVENOUS

## 2021-07-31 MED ORDER — ALUM & MAG HYDROXIDE-SIMETH 200-200-20 MG/5ML PO SUSP: 30.0000 mL | Freq: Four times a day (QID) | ORAL | Status: DC | PRN

## 2021-07-31 MED ORDER — BUPIVACAINE HCL (PF) 0.5 % IJ SOLN
INTRAMUSCULAR | Status: AC
  Filled 2021-07-31: qty 30

## 2021-07-31 MED ORDER — HYDROXYZINE HCL 25 MG PO TABS: 25.0000 mg | ORAL_TABLET | Freq: Four times a day (QID) | ORAL | Status: DC | PRN

## 2021-07-31 MED ORDER — METHOCARBAMOL 500 MG PO TABS: 500.0000 mg | ORAL_TABLET | Freq: Four times a day (QID) | ORAL | Status: DC | PRN

## 2021-07-31 MED ORDER — LORAZEPAM 0.5 MG PO TABS: 0.5000 mg | ORAL_TABLET | Freq: Two times a day (BID) | ORAL | Status: DC | PRN

## 2021-07-31 MED ORDER — PHENYLEPHRINE HCL-NACL 20-0.9 MG/250ML-% IV SOLN
INTRAVENOUS | Status: DC | PRN
  Administered 2021-07-31: 40 ug/min via INTRAVENOUS

## 2021-07-31 MED ORDER — ACETAMINOPHEN 500 MG PO TABS: 1000.0000 mg | ORAL_TABLET | Freq: Once | ORAL | Status: AC

## 2021-07-31 MED ORDER — FLEET ENEMA 7-19 GM/118ML RE ENEM: 1.0000 | ENEMA | Freq: Once | RECTAL | Status: DC | PRN

## 2021-07-31 MED ORDER — LIDOCAINE-EPINEPHRINE 1 %-1:100000 IJ SOLN
INTRAMUSCULAR | Status: DC | PRN
  Administered 2021-07-31: 5 mL

## 2021-07-31 MED ORDER — CELECOXIB 200 MG PO CAPS
ORAL_CAPSULE | ORAL | Status: AC
  Administered 2021-07-31: 200 mg via ORAL
  Filled 2021-07-31: qty 1

## 2021-07-31 MED ORDER — ONDANSETRON HCL 4 MG/2ML IJ SOLN
INTRAMUSCULAR | Status: DC | PRN
Start: 2021-07-31 — End: 2021-07-31
  Administered 2021-07-31: 4 mg via INTRAVENOUS

## 2021-07-31 MED ORDER — ORAL CARE MOUTH RINSE: 15.0000 mL | Freq: Once | OROMUCOSAL | Status: AC

## 2021-07-31 SURGICAL SUPPLY — 55 items
ADH SKN CLS APL DERMABOND .7 (GAUZE/BANDAGES/DRESSINGS) ×1
BAG COUNTER SPONGE SURGICOUNT (BAG) ×2 IMPLANT
BAG SPNG CNTER NS LX DISP (BAG) ×1
BAND INSRT 18 STRL LF DISP RB (MISCELLANEOUS)
BAND RUBBER #18 3X1/16 STRL (MISCELLANEOUS) IMPLANT
BLADE CLIPPER SURG (BLADE) IMPLANT
BUR ACORN 6.0 (BURR) IMPLANT
BUR MATCHSTICK NEURO 3.0 LAGG (BURR) ×2 IMPLANT
CANISTER SUCT 3000ML PPV (MISCELLANEOUS) ×2 IMPLANT
DECANTER SPIKE VIAL GLASS SM (MISCELLANEOUS) ×2 IMPLANT
DERMABOND ADVANCED (GAUZE/BANDAGES/DRESSINGS) ×1
DERMABOND ADVANCED .7 DNX12 (GAUZE/BANDAGES/DRESSINGS) ×1 IMPLANT
DEVICE DISSECT PLASMABLAD 3.0S (MISCELLANEOUS) ×1 IMPLANT
DRAPE C-ARM 35X43 STRL (DRAPES) ×1 IMPLANT
DRAPE C-ARMOR (DRAPES) ×1 IMPLANT
DRAPE HALF SHEET 40X57 (DRAPES) IMPLANT
DRAPE LAPAROTOMY T 102X78X121 (DRAPES) ×2 IMPLANT
DRAPE MICROSCOPE LEICA (MISCELLANEOUS) IMPLANT
DRSG TELFA 3X8 NADH (GAUZE/BANDAGES/DRESSINGS) ×2 IMPLANT
DURAPREP 26ML APPLICATOR (WOUND CARE) ×2 IMPLANT
ELECT REM PT RETURN 9FT ADLT (ELECTROSURGICAL) ×2
ELECTRODE REM PT RTRN 9FT ADLT (ELECTROSURGICAL) ×1 IMPLANT
GAUZE 4X4 16PLY ~~LOC~~+RFID DBL (SPONGE) ×1 IMPLANT
GAUZE SPONGE 4X4 12PLY STRL (GAUZE/BANDAGES/DRESSINGS) ×2 IMPLANT
GLOVE SURG LTX SZ8 (GLOVE) ×1 IMPLANT
GLOVE SURG LTX SZ8.5 (GLOVE) ×2 IMPLANT
GLOVE SURG UNDER POLY LF SZ7.5 (GLOVE) ×3 IMPLANT
GLOVE SURG UNDER POLY LF SZ8.5 (GLOVE) ×2 IMPLANT
GOWN STRL REUS W/ TWL LRG LVL3 (GOWN DISPOSABLE) IMPLANT
GOWN STRL REUS W/ TWL XL LVL3 (GOWN DISPOSABLE) IMPLANT
GOWN STRL REUS W/TWL 2XL LVL3 (GOWN DISPOSABLE) ×3 IMPLANT
GOWN STRL REUS W/TWL LRG LVL3 (GOWN DISPOSABLE) ×2
GOWN STRL REUS W/TWL XL LVL3 (GOWN DISPOSABLE) ×2
KIT BASIN OR (CUSTOM PROCEDURE TRAY) ×2 IMPLANT
KIT TURNOVER KIT B (KITS) ×2 IMPLANT
NDL BIOPSY T-LOK 11X4 (NEEDLE) IMPLANT
NDL SPNL 20GX3.5 QUINCKE YW (NEEDLE) IMPLANT
NEEDLE BIOPSY T-LOK 11X4 (NEEDLE) ×2 IMPLANT
NEEDLE HYPO 22GX1.5 SAFETY (NEEDLE) ×2 IMPLANT
NEEDLE SPNL 20GX3.5 QUINCKE YW (NEEDLE) IMPLANT
NS IRRIG 1000ML POUR BTL (IV SOLUTION) ×2 IMPLANT
PACK LAMINECTOMY NEURO (CUSTOM PROCEDURE TRAY) ×2 IMPLANT
PAD ARMBOARD 7.5X6 YLW CONV (MISCELLANEOUS) ×5 IMPLANT
PAD DRESSING TELFA 3X8 NADH (GAUZE/BANDAGES/DRESSINGS) IMPLANT
PATTIES SURGICAL .5 X1 (DISPOSABLE) ×2 IMPLANT
PLASMABLADE 3.0S (MISCELLANEOUS) ×2
SPONGE SURGIFOAM ABS GEL SZ50 (HEMOSTASIS) ×1 IMPLANT
SUT VIC AB 1 CT1 18XBRD ANBCTR (SUTURE) ×1 IMPLANT
SUT VIC AB 1 CT1 8-18 (SUTURE) ×2
SUT VIC AB 2-0 CP2 18 (SUTURE) ×2 IMPLANT
SUT VIC AB 3-0 SH 8-18 (SUTURE) ×2 IMPLANT
SUT VIC AB 4-0 RB1 18 (SUTURE) ×2 IMPLANT
TOWEL GREEN STERILE (TOWEL DISPOSABLE) ×2 IMPLANT
TOWEL GREEN STERILE FF (TOWEL DISPOSABLE) ×2 IMPLANT
WATER STERILE IRR 1000ML POUR (IV SOLUTION) ×2 IMPLANT

## 2021-07-31 NOTE — Op Note (Signed)
Date of surgery: 07/31/2021 Preoperative diagnosis: Lesion of T12 vertebral body Postoperative diagnosis: Same Procedure: Open biopsy of T12 vertebral body and epidural lesion Surgeon: Kristeen Miss First Assistant: Pieter Partridge Dawley Anesthesia: General endotracheal Indications: Virginia Oneal is a 55 year old individual who had some vague back pain about a year ago and MRI of the thoracic spine demonstrated that there was a abnormal lesion at the level of T7 and T12 that was ill-defined.  It was observed for period of time and appeared that the lesion was enlarging and there was then a small epidural component directly ventral to the spinal cord.  In June the patient underwent a needle guided vertebral body biopsy.  The biopsy was nondiagnostic.  The lesion has since progressed in the vertebral body and a open biopsy is now being performed.  Procedure: Patient was brought to the operating room supine on the stretcher.  After the smooth induction of general endotracheal anesthesia, she was carefully turned prone.  The back was prepped with alcohol DuraPrep and draped in a sterile fashion fluoroscopic guidance was used to mark the region of the right pedicle at T12 and the infra pedicle margins.  A paramedian incision was created in this area and dissection of the paramedian musculature was done under open visualization.  The lateral aspect of the T12 vertebral body at the pars and pedicle junction was uncovered.  The bone in this area was then drilled down.  The bony shavings were saved.  The pedicle site was entered and then a series of core biopsies was obtained through the pedicle using a Jamshidi needle.  The cores were sent as a singular specimen.  Then drilling of the pedicle itself inferiorly was undertaken to expose the lateral dural wall.  This area was then carefully dissected free and the ventral aspect of the bone was also sent for several pieces as specimen.  Medial to this we encountered some gelatinous  fatty looking tissue which was the soft tissue component in the ventral aspect of the vertebral body.  Specimens of this were obtained along with some of the bony edges that it was attached to.  This was sent as a separate specimen.  When all the soft tissue that could be reached using a series of probes was removed hemostasis in the area was obtained.  The bony cores were sent as 1 specimen the bony drilling and gross bony removal was sent as a second specimen along with the shavings trapped in the bone suction trap and the soft tissue itself was sent as a separate specimen.  With this hemostasis was achieved in the epidural space and the vertebral body itself and then the retractor was removed 25 cc of half percent Marcaine was injected into the paraspinous fascia and tissues then the thoracodorsal fascia was closed with 2-0 Vicryl in interrupted fashion 2-0 Vicryl and 3-0 Vicryl was used in the subcuticular tissues 4-0 Vicryl was used in the final subcuticular closure Dermabond was placed on the skin blood loss was estimated at 75 cc.

## 2021-07-31 NOTE — Plan of Care (Signed)

## 2021-07-31 NOTE — Anesthesia Procedure Notes (Signed)
Procedure Name: Intubation Date/Time: 07/31/2021 8:14 AM Performed by: Amadeo Garnet, CRNA Pre-anesthesia Checklist: Patient identified, Emergency Drugs available, Suction available, Patient being monitored and Timeout performed Patient Re-evaluated:Patient Re-evaluated prior to induction Oxygen Delivery Method: Circle system utilized Preoxygenation: Pre-oxygenation with 100% oxygen Induction Type: IV induction Ventilation: Mask ventilation without difficulty Laryngoscope Size: 3 and Glidescope Grade View: Grade I Tube type: Oral Tube size: 7.0 mm Number of attempts: 1 Airway Equipment and Method: Stylet and Video-laryngoscopy Placement Confirmation: ETT inserted through vocal cords under direct vision, positive ETCO2 and breath sounds checked- equal and bilateral Secured at: 22 cm Tube secured with: Tape Dental Injury: Teeth and Oropharynx as per pre-operative assessment  Comments: SRNA intubation. Elective videolaryngoscope for learning opportunity.

## 2021-07-31 NOTE — Progress Notes (Signed)
Patient was transported to her vehicle via wheelchair by NT for discharge home; in no acute distress nor complaints of pain nor discomfort;  incision on her back with skin glue was clean, dry and intact; room was checked and accounted for all her belongings; discharge instructions concerning her medications, wound care, follow up appointment and when to call the doctor were all discussed with patient and her husband by RN and both expressed understanding on the instructions given.

## 2021-07-31 NOTE — Anesthesia Postprocedure Evaluation (Signed)
Anesthesia Post Note  Patient: Virginia Oneal  Procedure(s) Performed: Open Transpedicular biopsy of Thoracic twelve (Back)     Patient location during evaluation: PACU Anesthesia Type: General Level of consciousness: sedated Pain management: pain level controlled Vital Signs Assessment: post-procedure vital signs reviewed and stable Respiratory status: spontaneous breathing and respiratory function stable Cardiovascular status: stable Postop Assessment: no apparent nausea or vomiting Anesthetic complications: no   No notable events documented.  Last Vitals:  Vitals:   07/31/21 1106 07/31/21 1121  BP: (!) 152/84 (!) 141/85  Pulse: 86 93  Resp: 13 13  Temp:    SpO2: 93% 92%    Last Pain:  Vitals:   07/31/21 1121  TempSrc:   PainSc: 0-No pain                 Maximos Zayas DANIEL

## 2021-07-31 NOTE — Discharge Summary (Signed)
Physician Discharge Summary  Patient ID: Virginia Oneal MRN: [id number] DOB/AGE: January 01, 1966 55 y.o.  Admit date: 07/31/2021 Discharge date: 07/31/2021  Admission Diagnoses: T12 vertebral body lesion  Discharge Diagnoses: T12 vertebral body lesion Active Problems:   Thoracic spine tumor   Discharged Condition: good  Hospital Course: Patient was admitted to undergo an open biopsy of the T12 vertebral body this was performed successfully.  Patient's pain is well controlled.  Consults: None  Significant Diagnostic Studies: None  Treatments: Vertebral body open biopsy.  Discharge Exam: Blood pressure (!) 142/77, pulse (!) 102, temperature 98 F (36.7 C), temperature source Oral, resp. rate 16, height 5\' 4"  (1.626 m), weight 108 kg, SpO2 94 %. Incision is clean and dry motor function is intact.  Disposition: Discharge disposition: 01-Home or Self Care       Discharge Instructions     Diet - low sodium heart healthy   Complete by: As directed    Discharge wound care:   Complete by: As directed    Okay to shower. Do not apply salves or appointments to incision. No heavy lifting with the upper extremities greater than 10 pounds. May resume driving when not requiring pain medication and patient feels comfortable with doing so.   Incentive spirometry RT   Complete by: As directed    Increase activity slowly   Complete by: As directed       Allergies as of 07/31/2021       Reactions   Buspar [buspirone Hcl]    Worsened her panic attacks        Medication List     TAKE these medications    acetaminophen 500 MG tablet Commonly known as: TYLENOL Take 1,000 mg by mouth every 6 (six) hours as needed for moderate pain or headache.   B-12 5000 MCG Tbdp Take 5,000 mcg by mouth daily.   betamethasone valerate ointment 0.1 % Commonly known as: VALISONE Apply 1 application topically 2 (two) times daily. What changed:  when to take this reasons to take this    Biotin 10000 MCG Tabs Take 10,000 mcg by mouth daily.   Black Cohosh 40 MG Caps Take 40 mg by mouth daily.   cetirizine 10 MG tablet Commonly known as: ZYRTEC Take 10 mg by mouth daily.   hydrochlorothiazide 25 MG tablet Commonly known as: HYDRODIURIL Take 1 tablet (25 mg total) by mouth daily. For blood pressure.   HYDROcodone-acetaminophen 5-325 MG tablet Commonly known as: Norco Take 1-2 tablets by mouth every 6 (six) hours as needed for moderate pain or severe pain.   hydrOXYzine 10 MG tablet Commonly known as: ATARAX/VISTARIL Take 1 or 2 tablets by mouth as needed for anxiety attack.   influenza vac split quadrivalent PF 0.5 ML injection Commonly known as: FLUARIX Inject into the muscle.   ketorolac 10 MG tablet Commonly known as: TORADOL Take 1 tablet (10 mg total) by mouth every 6 (six) hours as needed for moderate pain or severe pain.   LORazepam 0.5 MG tablet Commonly known as: ATIVAN Take 1 tablet (0.5 mg total) by mouth every 12 (twelve) hours as needed for anxiety.   Maca 500 MG Caps Take 500 mg by mouth daily.   Moderna COVID-19 Bival Booster 50 MCG/0.5ML injection Generic drug: COVID-19 mRNA bivalent vaccine (Moderna) Inject into the muscle.   prenatal multivitamin Tabs tablet Take 1 tablet by mouth daily.   Turmeric 500 MG Caps Take 500 mg by mouth daily.   venlafaxine XR 37.5 MG 24  hr capsule Commonly known as: EFFEXOR-XR TAKE 1 CAPSULE (37.5 MG TOTAL) BY MOUTH DAILY WITH BREAKFAST. FOR HOT FLASHES AND ANXIETY   Vitamin D3 125 MCG (5000 UT) Caps Take 5,000 Units by mouth daily.               Discharge Care Instructions  (From admission, onward)           Start     Ordered   07/31/21 0000  Discharge wound care:       Comments: Okay to shower. Do not apply salves or appointments to incision. No heavy lifting with the upper extremities greater than 10 pounds. May resume driving when not requiring pain medication and patient feels  comfortable with doing so.   07/31/21 1745             Signed: Blanchie Dessert Joud Pettinato 07/31/2021, 5:46 PM

## 2021-07-31 NOTE — Evaluation (Signed)
Physical Therapy Evaluation and Discharge Patient Details Name: Virginia Oneal MRN: [id number] DOB: May 09, 1966 Today's Date: 07/31/2021  History of Present Illness  Pt is a 55 y/o female s/p open biopsy of T12 lesion. PMH includes HTN.  Clinical Impression  Patient evaluated by Physical Therapy with no further acute PT needs identified. All education has been completed and the patient has no further questions. Pt overall steady at a supervision level for transfers, gait and stair navigation. No LOB noted. Reviewed back precautions to help with pain management. Pt reports husband can assist as needed. See below for any follow-up Physical Therapy or equipment needs. PT is signing off. Thank you for this referral. If needs change, please re-consult.         Recommendations for follow up therapy are one component of a multi-disciplinary discharge planning process, led by the attending physician.  Recommendations may be updated based on patient status, additional functional criteria and insurance authorization.  Follow Up Recommendations No PT follow up    Equipment Recommendations  None recommended by PT    Recommendations for Other Services       Precautions / Restrictions Precautions Precautions: Back Precaution Booklet Issued: No Precaution Comments: Educated about back precautions for pain management. Restrictions Weight Bearing Restrictions: No      Mobility  Bed Mobility Overal bed mobility: Modified Independent             General bed mobility comments: Increased time secondary to pain    Transfers Overall transfer level: Needs assistance Equipment used: None Transfers: Sit to/from Stand Sit to Stand: Supervision         General transfer comment: Supervision for safety  Ambulation/Gait Ambulation/Gait assistance: Supervision Gait Distance (Feet): 400 Feet Assistive device: None Gait Pattern/deviations: Step-through pattern;Decreased stride length Gait  velocity: Decreased   General Gait Details: Slow, cautious gait. Supervision for safety. No LOB noted. Educated about walking program.  Stairs Stairs: Yes Stairs assistance: Supervision Stair Management: One rail Right;Step to pattern;Forwards Number of Stairs: 4 General stair comments: Supervision for safety. No LOB noted.  Wheelchair Mobility    Modified Rankin (Stroke Patients Only)       Balance Overall balance assessment: No apparent balance deficits (not formally assessed)                                           Pertinent Vitals/Pain Pain Assessment: Faces Faces Pain Scale: Hurts even more Pain Location: back Pain Descriptors / Indicators: Aching;Operative site guarding Pain Intervention(s): Limited activity within patient's tolerance;Monitored during session;Repositioned    Home Living Family/patient expects to be discharged to:: Private residence Living Arrangements: Spouse/significant other;Children Available Help at Discharge: Family Type of Home: House Home Access: Stairs to enter Entrance Stairs-Rails: None Technical brewer of Steps: 2 Home Layout: Two level Home Equipment: None      Prior Function Level of Independence: Independent               Hand Dominance        Extremity/Trunk Assessment   Upper Extremity Assessment Upper Extremity Assessment: Overall WFL for tasks assessed    Lower Extremity Assessment Lower Extremity Assessment: Overall WFL for tasks assessed    Cervical / Trunk Assessment Cervical / Trunk Assessment: Normal  Communication   Communication: No difficulties  Cognition Arousal/Alertness: Awake/alert Behavior During Therapy: WFL for tasks assessed/performed Overall Cognitive Status: Within Functional  Limits for tasks assessed                                        General Comments General comments (skin integrity, edema, etc.): Pt's husband present during session     Exercises     Assessment/Plan    PT Assessment Patent does not need any further PT services  PT Problem List         PT Treatment Interventions      PT Goals (Current goals can be found in the Care Plan section)  Acute Rehab PT Goals Patient Stated Goal: to go home PT Goal Formulation: With patient Time For Goal Achievement: 07/31/21 Potential to Achieve Goals: Good    Frequency     Barriers to discharge        Co-evaluation               AM-PAC PT "6 Clicks" Mobility  Outcome Measure Help needed turning from your back to your side while in a flat bed without using bedrails?: None Help needed moving from lying on your back to sitting on the side of a flat bed without using bedrails?: None Help needed moving to and from a bed to a chair (including a wheelchair)?: None Help needed standing up from a chair using your arms (e.g., wheelchair or bedside chair)?: None Help needed to walk in hospital room?: None Help needed climbing 3-5 steps with a railing? : None 6 Click Score: 24    End of Session Equipment Utilized During Treatment: Gait belt Activity Tolerance: Patient tolerated treatment well Patient left: in bed;with call bell/phone within reach Nurse Communication: Mobility status PT Visit Diagnosis: Other abnormalities of gait and mobility (R26.89);Pain Pain - part of body:  (back)    Time: 6568-1275 PT Time Calculation (min) (ACUTE ONLY): 19 min   Charges:   PT Evaluation $PT Eval Low Complexity: 1 Low          Lou Miner, DPT  Acute Rehabilitation Services  Pager: [phone] Office: [phone]   Rudean Hitt 07/31/2021, 3:10 PM

## 2021-07-31 NOTE — H&P (Signed)
Virginia Oneal is an 55 y.o. female.   Chief Complaint: Progressive lesion at T12 HPI: Patient is a 55 year old individual who has had a lesion noted at the T12 vertebrae.  There is a small soft tissue component directly ventral to the spinal cord.  In June she underwent a needle biopsy of the bone which was nondiagnostic but suggested possible Paget's disease.  A follow-up MRI demonstrates that the lesion is progressive.  An open biopsy is now being performed.  Past Medical History:  Diagnosis Date   Abnormal uterine bleeding    Anemia    as a teenager   Anxiety    postpartum and hx panic attacks   Arrhythmia    Arthritis    back and hands   Chest mass    by CT scanning   Chest pain 61/60/7371   Complication of anesthesia    slow to wake up   COVID-19 10/2020   Depression    DOE (dyspnea on exertion) 07/26/2014   Family history of adverse reaction to anesthesia    mom slow to wake up   Gestational diabetes    resolved after pregnancy   History of kidney stones    Hypertension    Personal history of colonic polyps    PONV (postoperative nausea and vomiting)    PREMATURE ATRIAL CONTRACTIONS 08/15/2008   Qualifier: Diagnosis of  By: Council Mechanic MD, Hilaria Ota    RENAL CALCULUS, HX OF 08/15/2008   Qualifier: Diagnosis of  By: Zara Council LPN, Wanda     Rotator cuff tendinitis 06/29/2011   Seasonal allergies    Urinary incontinence 05/2009   hx bladder sling--Dr. Quincy Simmonds    Past Surgical History:  Procedure Laterality Date   ABDOMINAL HYSTERECTOMY  05/2009   TAH/LSO   BLADDER SUSPENSION  2010   Dr. Quincy Simmonds   BREAST BIOPSY     left stereo   CESAREAN SECTION  03/2003   CESAREAN SECTION  2004   CHOLECYSTECTOMY N/A 01/02/2021   Procedure: LAPAROSCOPIC CHOLECYSTECTOMY;  Surgeon: Rolm Bookbinder, MD;  Location: [facility];  Service: General;  Laterality: N/A;  58  - GEN AND TAP BLOCK LDOW   COLONOSCOPY  09/08   COLPORRHAPHY  05/2009   Dr. Quincy Simmonds   ENDOMETRIAL ABLATION  2006   Dr. Quincy Simmonds    INCONTINENCE SURGERY     sling for urinary incontinence   KYPHOPLASTY N/A 04/07/2021   Procedure: Thoracic 12 Vertebral body biopsy;  Surgeon: Kristeen Miss, MD;  Location: [facility];  Service: Neurosurgery;  Laterality: N/A;   LAPAROSCOPIC ENDOMETRIOSIS FULGURATION     ablation   OOPHORECTOMY  2010   w/hyst--Dr. Quincy Simmonds   TUBAL LIGATION  2004   w/ C-section Dr. Quincy Simmonds   TYMPANOPLASTY     right ear   VAGINAL HYSTERECTOMY  05/21/2009   lap assisted /partial    Family History  Problem Relation Age of Onset   Kidney disease Mother        renal failure   Stroke Mother    Diabetes Mother    Hypertension Mother    Heart failure Mother    Colon cancer Mother    Stroke Father    Cancer - Colon Maternal Aunt    Hypertension Sister    Rheum arthritis Sister    Hypertension Brother    Hypertension Sister    Hypertension Sister    Hypertension Sister    Breast cancer Sister    Kidney cancer Sister    Esophageal cancer Neg Hx  Rectal cancer Neg Hx    Stomach cancer Neg Hx    Social History:  reports that she quit smoking about 22 years ago. Her smoking use included cigarettes. She has never used smokeless tobacco. She reports that she does not drink alcohol and does not use drugs.  Allergies:  Allergies  Allergen Reactions   Buspar [Buspirone Hcl]     Worsened her panic attacks    Medications Prior to Admission  Medication Sig Dispense Refill   acetaminophen (TYLENOL) 500 MG tablet Take 1,000 mg by mouth every 6 (six) hours as needed for moderate pain or headache.     betamethasone valerate ointment (VALISONE) 0.1 % Apply 1 application topically 2 (two) times daily. (Patient taking differently: Apply 1 application topically 2 (two) times daily as needed (rash).) 45 g 1   Biotin 10000 MCG TABS Take 10,000 mcg by mouth daily.     Black Cohosh 40 MG CAPS Take 40 mg by mouth daily.     cetirizine (ZYRTEC) 10 MG tablet Take 10 mg by mouth daily.      Cholecalciferol (VITAMIN D3) 125  MCG (5000 UT) CAPS Take 5,000 Units by mouth daily.     COVID-19 mRNA bivalent vaccine, Moderna, (MODERNA COVID-19 BIVAL BOOSTER) 50 MCG/0.5ML injection Inject into the muscle. 0.5 mL 0   hydrochlorothiazide (HYDRODIURIL) 25 MG tablet Take 1 tablet (25 mg total) by mouth daily. For blood pressure. 90 tablet 3   hydrOXYzine (ATARAX/VISTARIL) 10 MG tablet Take 1 or 2 tablets by mouth as needed for anxiety attack. 30 tablet 0   Maca 500 MG CAPS Take 500 mg by mouth daily.     Methylcobalamin (B-12) 5000 MCG TBDP Take 5,000 mcg by mouth daily.     Prenatal Vit-Fe Fumarate-FA (PRENATAL MULTIVITAMIN) TABS tablet Take 1 tablet by mouth daily.     Turmeric 500 MG CAPS Take 500 mg by mouth daily.     venlafaxine XR (EFFEXOR-XR) 37.5 MG 24 hr capsule TAKE 1 CAPSULE (37.5 MG TOTAL) BY MOUTH DAILY WITH BREAKFAST. FOR HOT FLASHES AND ANXIETY 90 capsule 0   influenza vac split quadrivalent PF (FLUARIX) 0.5 ML injection Inject into the muscle. 0.5 mL 0   LORazepam (ATIVAN) 0.5 MG tablet Take 1 tablet (0.5 mg total) by mouth every 12 (twelve) hours as needed for anxiety. 30 tablet 1    Results for orders placed or performed during the hospital encounter of 07/31/21 (from the past 48 hour(s))  Glucose, capillary     Status: Abnormal   Collection Time: 07/31/21  5:49 AM  Result Value Ref Range   Glucose-Capillary 118 (H) 70 - 99 mg/dL    Comment: Glucose reference range applies only to samples taken after fasting for at least 8 hours.   No results found.  Review of Systems  All other systems reviewed and are negative.  Blood pressure (!) 157/89, pulse 78, temperature 98.3 F (36.8 C), temperature source Oral, resp. rate 18, height 5\' 4"  (1.626 m), weight 108 kg, SpO2 96 %. Physical Exam Constitutional:      Appearance: Normal appearance. She is normal weight.  HENT:     Head: Normocephalic and atraumatic.     Right Ear: Tympanic membrane normal.     Left Ear: Tympanic membrane normal.     Nose: Nose  normal.     Mouth/Throat:     Mouth: Mucous membranes are moist.  Eyes:     Extraocular Movements: Extraocular movements intact.     Pupils: Pupils  are equal, round, and reactive to light.  Cardiovascular:     Rate and Rhythm: Normal rate and regular rhythm.  Pulmonary:     Effort: Pulmonary effort is normal.     Breath sounds: Normal breath sounds.  Abdominal:     General: Abdomen is flat.     Palpations: Abdomen is soft.  Musculoskeletal:        General: Normal range of motion.     Cervical back: Normal range of motion.     Comments: Vague tenderness over the thoracolumbar junction  Skin:    Capillary Refill: Capillary refill takes less than 2 seconds.  Neurological:     General: No focal deficit present.     Mental Status: She is alert and oriented to person, place, and time. Mental status is at baseline.  Psychiatric:        Mood and Affect: Mood normal.        Behavior: Behavior normal.        Thought Content: Thought content normal.        Judgment: Judgment normal.     Assessment/Plan Lesion at T12  Biopsy of T12 lesion  Earleen Newport, MD 07/31/2021, 7:59 AM

## 2021-07-31 NOTE — Transfer of Care (Signed)
Immediate Anesthesia Transfer of Care Note  Patient: Virginia Oneal  Procedure(s) Performed: Open Transpedicular biopsy of Thoracic twelve (Back)  Patient Location: PACU  Anesthesia Type:General  Level of Consciousness: drowsy and patient cooperative  Airway & Oxygen Therapy: Patient Spontanous Breathing and Patient connected to face mask oxygen  Post-op Assessment: Report given to RN, Post -op Vital signs reviewed and stable and Patient moving all extremities X 4  Post vital signs: Reviewed and stable  Last Vitals:  Vitals Value Taken Time  BP 156/86 07/31/21 1021  Temp    Pulse 93 07/31/21 1022  Resp 15 07/31/21 1022  SpO2 96 % 07/31/21 1022  Vitals shown include unvalidated device data.  Last Pain:  Vitals:   07/31/21 0547  TempSrc: Oral         Complications: No notable events documented.

## 2021-08-01 ENCOUNTER — Encounter (HOSPITAL_COMMUNITY): Payer: Self-pay | Admitting: Neurological Surgery

## 2021-08-06 ENCOUNTER — Other Ambulatory Visit: Payer: Self-pay | Admitting: Radiation Therapy

## 2021-08-06 ENCOUNTER — Telehealth: Payer: Self-pay | Admitting: Hematology

## 2021-08-06 DIAGNOSIS — C8590 Non-Hodgkin lymphoma, unspecified, unspecified site: Secondary | ICD-10-CM

## 2021-08-06 NOTE — Telephone Encounter (Signed)
Scheduled appt per 10/26 referral. Pt is aware of appt date and time.

## 2021-08-12 ENCOUNTER — Ambulatory Visit: Payer: 59

## 2021-08-12 DIAGNOSIS — C801 Malignant (primary) neoplasm, unspecified: Secondary | ICD-10-CM

## 2021-08-12 HISTORY — DX: Malignant (primary) neoplasm, unspecified: C80.1

## 2021-08-14 ENCOUNTER — Encounter (HOSPITAL_COMMUNITY): Payer: Self-pay

## 2021-08-19 ENCOUNTER — Ambulatory Visit: Payer: 59

## 2021-08-20 ENCOUNTER — Inpatient Hospital Stay: Payer: 59

## 2021-08-20 ENCOUNTER — Inpatient Hospital Stay: Payer: 59 | Attending: Hematology | Admitting: Hematology

## 2021-08-20 ENCOUNTER — Other Ambulatory Visit: Payer: Self-pay

## 2021-08-20 VITALS — BP 174/90 | HR 95 | Temp 98.3°F | Resp 18 | Ht 64.0 in | Wt 240.3 lb

## 2021-08-20 DIAGNOSIS — I10 Essential (primary) hypertension: Secondary | ICD-10-CM | POA: Diagnosis not present

## 2021-08-20 DIAGNOSIS — Z800 Family history of malignant neoplasm of digestive organs: Secondary | ICD-10-CM | POA: Diagnosis not present

## 2021-08-20 DIAGNOSIS — C8512 Unspecified B-cell lymphoma, intrathoracic lymph nodes: Secondary | ICD-10-CM | POA: Insufficient documentation

## 2021-08-20 DIAGNOSIS — Z87442 Personal history of urinary calculi: Secondary | ICD-10-CM | POA: Insufficient documentation

## 2021-08-20 DIAGNOSIS — Z79899 Other long term (current) drug therapy: Secondary | ICD-10-CM | POA: Insufficient documentation

## 2021-08-20 DIAGNOSIS — Z8616 Personal history of COVID-19: Secondary | ICD-10-CM | POA: Diagnosis not present

## 2021-08-20 DIAGNOSIS — C8330 Diffuse large B-cell lymphoma, unspecified site: Secondary | ICD-10-CM | POA: Diagnosis not present

## 2021-08-20 DIAGNOSIS — Z803 Family history of malignant neoplasm of breast: Secondary | ICD-10-CM | POA: Insufficient documentation

## 2021-08-20 DIAGNOSIS — Z86010 Personal history of colonic polyps: Secondary | ICD-10-CM | POA: Insufficient documentation

## 2021-08-20 LAB — CBC WITH DIFFERENTIAL/PLATELET
Abs Immature Granulocytes: 0.02 10*3/uL (ref 0.00–0.07)
Basophils Absolute: 0 10*3/uL (ref 0.0–0.1)
Basophils Relative: 0 %
Eosinophils Absolute: 0.2 10*3/uL (ref 0.0–0.5)
Eosinophils Relative: 2 %
HCT: 39.5 % (ref 36.0–46.0)
Hemoglobin: 13.4 g/dL (ref 12.0–15.0)
Immature Granulocytes: 0 %
Lymphocytes Relative: 40 %
Lymphs Abs: 2.7 10*3/uL (ref 0.7–4.0)
MCH: 30.4 pg (ref 26.0–34.0)
MCHC: 33.9 g/dL (ref 30.0–36.0)
MCV: 89.6 fL (ref 80.0–100.0)
Monocytes Absolute: 0.7 10*3/uL (ref 0.1–1.0)
Monocytes Relative: 10 %
Neutro Abs: 3.1 10*3/uL (ref 1.7–7.7)
Neutrophils Relative %: 48 %
Platelets: 171 10*3/uL (ref 150–400)
RBC: 4.41 MIL/uL (ref 3.87–5.11)
RDW: 12.6 % (ref 11.5–15.5)
WBC: 6.7 10*3/uL (ref 4.0–10.5)
nRBC: 0 % (ref 0.0–0.2)

## 2021-08-20 LAB — HEPATITIS C ANTIBODY: HCV Ab: NONREACTIVE

## 2021-08-20 LAB — CMP (CANCER CENTER ONLY)
ALT: 24 U/L (ref 0–44)
AST: 21 U/L (ref 15–41)
Albumin: 4 g/dL (ref 3.5–5.0)
Alkaline Phosphatase: 124 U/L (ref 38–126)
Anion gap: 8 (ref 5–15)
BUN: 16 mg/dL (ref 6–20)
CO2: 27 mmol/L (ref 22–32)
Calcium: 9.6 mg/dL (ref 8.9–10.3)
Chloride: 104 mmol/L (ref 98–111)
Creatinine: 0.7 mg/dL (ref 0.44–1.00)
GFR, Estimated: >60 mL/min (ref >60)
Glucose, Bld: 101 mg/dL — ABNORMAL HIGH (ref 70–99)
Potassium: 3.5 mmol/L (ref 3.5–5.1)
Sodium: 139 mmol/L (ref 135–145)
Total Bilirubin: 0.3 mg/dL (ref 0.3–1.2)
Total Protein: 7.2 g/dL (ref 6.5–8.1)

## 2021-08-20 LAB — LACTATE DEHYDROGENASE: LDH: 228 U/L — ABNORMAL HIGH (ref 98–192)

## 2021-08-20 LAB — HEPATITIS B CORE ANTIBODY, TOTAL: Hep B Core Total Ab: NONREACTIVE

## 2021-08-20 LAB — HEPATITIS B SURFACE ANTIGEN: Hepatitis B Surface Ag: NONREACTIVE

## 2021-08-20 LAB — HIV ANTIBODY (ROUTINE TESTING W REFLEX): HIV Screen 4th Generation wRfx: NONREACTIVE

## 2021-08-20 NOTE — Progress Notes (Addendum)
[name]   HEMATOLOGY/ONCOLOGY CONSULTATION NOTE  Date of Service: 08/20/2021  Patient Care Team: Pleas Koch, NP as PCP - General (Internal Medicine) Kristeen Miss, MD as Consulting Physician (Neurosurgery) Rolm Bookbinder, MD as Consulting Physician (General Surgery)  CHIEF COMPLAINTS/PURPOSE OF CONSULTATION:  High grade large B cell lymphoma  HISTORY OF PRESENTING ILLNESS:   Virginia Oneal is a wonderful 55 y.o. female who has been referred to [name] by Dr Kristeen Miss MD for evaluation and management of newly diagnosed high grade B cell lymphoma.  Patient has a history of hypertension, seasonal allergies, gestational diabetes, depression/anxiety, COVID-19 infection in January 2022, kidney stones presented with gross hematuria for 3 days in February 2022.  She was evaluated by her primary care provider and had a CT abdomen and pelvis on 11/20/2020 which showed small nonobstructive calculus in the right kidney.  No suspicious urinary tract mass.  She was noted to have new sclerosis of the T12 vertebral body concerning for possible osseous metastatic disease.  There was no evidence of primary malignancy in the abdomen or pelvis. Patient subsequently had a bone scan on 12/23/2020 which showed 1. Intense activity within the posterior aspect of the T12 vertebralbody which extends in symmetric fashion through the LEFT and RIGHT pedicle. This is associated by sclerosis on the CT portion of the exam. The symmetric nature of the lesion a could indicate trauma. Recommend clinical correlation. Cannot exclude malignancy at this level. 2. No evidence of malignant lesions elsewhere on the whole-body scan.  Patient reports that she was referred to Dr. Kristeen Miss for further evaluation of her T12 lesion and had a MRI of the thoracic spine with and without contrast on 02/20/2021 which showed Heterogeneous Decreased T1/T2 signal in the T12 vertebral body associated with sclerosis of that vertebra on CT which  is new since 2020. There is a small enhancing extraosseous component mildly projecting into the ventral epidural space which appears 1 or 2 mm larger since February (series 10, image 7), but no dural involvement or spinal stenosis. And there is a second smaller but similar lesion in the posterior right T7 body, also more conspicuous since February.  There is a chronic and seemingly unrelated 4 cm T5-T6 left paraspinal cystic mass, stable since 2016.  Patient had a T12 vertebral body biopsy by Dr. Ellene Route on 04/07/2021 which was indeterminate and showed no overt malignancy.  Patient had a follow-up MRI of the thoracic spine on 07/04/2021 which showed 1. Mild interval progression of the T12 lesion, especially on the right where there is ventral epidural soft tissue which is non-compressive. Unchanged infiltrative lesion in the right T7 body and pedicle.  Patient had a repeat open transpedicular biopsy of T12 vertebra with Dr. Ellene Route on 07/31/2021 which showed high-grade non-Hodgkin's B-cell lymphoma with germinal center phenotype Ki-67 of 50 to 60%. Prior study did not show c-Myc rearrangement.  Patient was referred to medical oncology for further evaluation and management.  Currently patient notes some discomfort in her mid back.  No lower extremity weakness.  No other specific focal symptoms. No change in bowel or bladder habits. No fevers no chills no night sweats no unexpected weight loss. No shortness of breath or chest pain. No abdominal pain or distention.   MEDICAL HISTORY:  Past Medical History:  Diagnosis Date   Abnormal uterine bleeding    Anemia    as a teenager   Anxiety    postpartum and hx panic attacks   Arrhythmia    Arthritis  back and hands   Chest mass    by CT scanning   Chest pain 18/29/9371   Complication of anesthesia    slow to wake up   COVID-19 10/2020   Depression    DOE (dyspnea on exertion) 07/26/2014   Family history of adverse reaction to anesthesia     mom slow to wake up   Gestational diabetes    resolved after pregnancy   History of kidney stones    Hypertension    Personal history of colonic polyps    PONV (postoperative nausea and vomiting)    PREMATURE ATRIAL CONTRACTIONS 08/15/2008   Qualifier: Diagnosis of  By: Council Mechanic MD, Hilaria Ota    RENAL CALCULUS, HX OF 08/15/2008   Qualifier: Diagnosis of  By: Zara Council LPN, Wanda     Rotator cuff tendinitis 06/29/2011   Seasonal allergies    Urinary incontinence 05/2009   hx bladder sling--Dr. Quincy Simmonds    SURGICAL HISTORY: Past Surgical History:  Procedure Laterality Date   ABDOMINAL HYSTERECTOMY  05/2009   TAH/LSO   BLADDER SUSPENSION  2010   Dr. Quincy Simmonds   BREAST BIOPSY     left stereo   CESAREAN SECTION  03/2003   CESAREAN SECTION  2004   CHOLECYSTECTOMY N/A 01/02/2021   Procedure: LAPAROSCOPIC CHOLECYSTECTOMY;  Surgeon: Rolm Bookbinder, MD;  Location: [facility];  Service: General;  Laterality: N/A;  58  - GEN AND TAP BLOCK LDOW   COLONOSCOPY  09/08   COLPORRHAPHY  05/2009   Dr. Quincy Simmonds   ENDOMETRIAL ABLATION  2006   Dr. Quincy Simmonds   INCONTINENCE SURGERY     sling for urinary incontinence   KYPHOPLASTY N/A 04/07/2021   Procedure: Thoracic 12 Vertebral body biopsy;  Surgeon: Kristeen Miss, MD;  Location: [facility];  Service: Neurosurgery;  Laterality: N/A;   LAPAROSCOPIC ENDOMETRIOSIS FULGURATION     ablation   LUMBAR LAMINECTOMY/DECOMPRESSION MICRODISCECTOMY N/A 07/31/2021   Procedure: Open Transpedicular biopsy of Thoracic twelve;  Surgeon: Kristeen Miss, MD;  Location: [facility];  Service: Neurosurgery;  Laterality: N/A;   OOPHORECTOMY  2010   w/hyst--Dr. Quincy Simmonds   TUBAL LIGATION  2004   w/ C-section Dr. Quincy Simmonds   TYMPANOPLASTY     right ear   VAGINAL HYSTERECTOMY  05/21/2009   lap assisted /partial    SOCIAL HISTORY: Social History   Socioeconomic History   Marital status: Married    Spouse name: Not on file   Number of children: 2   Years of education: Not on file   Highest  education level: Not on file  Occupational History   Occupation: [employer]  Tobacco Use   Smoking status: Former    Years: 10.00    Types: Cigarettes    Quit date: 01/06/1999    Years since quitting: 22.6   Smokeless tobacco: Never  Vaping Use   Vaping Use: Never used  Substance and Sexual Activity   Alcohol use: No    Alcohol/week: 0.0 standard drinks   Drug use: No   Sexual activity: Yes    Partners: Male    Birth control/protection: Other-see comments, Surgical    Comment: TAH/LSO  Other Topics Concern   Not on file  Social History Narrative   ** Merged History Encounter **       Social Determinants of Health   Financial Resource Strain: Not on file  Food Insecurity: Not on file  Transportation Needs: Not on file  Physical Activity: Not on file  Stress: Not on file  Social  Connections: Not on file  Intimate Partner Violence: Not on file    FAMILY HISTORY: Family History  Problem Relation Age of Onset   Kidney disease Mother        renal failure   Stroke Mother    Diabetes Mother    Hypertension Mother    Heart failure Mother    Colon cancer Mother    Stroke Father    Cancer - Colon Maternal Aunt    Hypertension Sister    Rheum arthritis Sister    Hypertension Brother    Hypertension Sister    Hypertension Sister    Hypertension Sister    Breast cancer Sister    Kidney cancer Sister    Esophageal cancer Neg Hx    Rectal cancer Neg Hx    Stomach cancer Neg Hx     ALLERGIES:  is allergic to buspar [buspirone hcl].  MEDICATIONS:  Current Outpatient Medications  Medication Sig Dispense Refill   acetaminophen (TYLENOL) 500 MG tablet Take 1,000 mg by mouth every 6 (six) hours as needed for moderate pain or headache.     betamethasone valerate ointment (VALISONE) 0.1 % Apply 1 application topically 2 (two) times daily. (Patient taking differently: Apply 1 application topically 2 (two) times daily as needed (rash).) 45 g 1   Biotin 10000 MCG TABS  Take 10,000 mcg by mouth daily.     Black Cohosh 40 MG CAPS Take 40 mg by mouth daily.     cetirizine (ZYRTEC) 10 MG tablet Take 10 mg by mouth daily.      Cholecalciferol (VITAMIN D3) 125 MCG (5000 UT) CAPS Take 5,000 Units by mouth daily.     COVID-19 mRNA bivalent vaccine, Moderna, (MODERNA COVID-19 BIVAL BOOSTER) 50 MCG/0.5ML injection Inject into the muscle. 0.5 mL 0   hydrochlorothiazide (HYDRODIURIL) 25 MG tablet Take 1 tablet (25 mg total) by mouth daily. For blood pressure. 90 tablet 3   HYDROcodone-acetaminophen (NORCO) 5-325 MG tablet Take 1-2 tablets by mouth every 6 (six) hours as needed for moderate pain or severe pain. 10 tablet 0   hydrOXYzine (ATARAX/VISTARIL) 10 MG tablet Take 1 or 2 tablets by mouth as needed for anxiety attack. 30 tablet 0   influenza vac split quadrivalent PF (FLUARIX) 0.5 ML injection Inject into the muscle. 0.5 mL 0   ketorolac (TORADOL) 10 MG tablet Take 1 tablet (10 mg total) by mouth every 6 (six) hours as needed for moderate pain or severe pain. 20 tablet 0   LORazepam (ATIVAN) 0.5 MG tablet Take 1 tablet (0.5 mg total) by mouth every 12 (twelve) hours as needed for anxiety. 30 tablet 1   Maca 500 MG CAPS Take 500 mg by mouth daily.     Methylcobalamin (B-12) 5000 MCG TBDP Take 5,000 mcg by mouth daily.     Prenatal Vit-Fe Fumarate-FA (PRENATAL MULTIVITAMIN) TABS tablet Take 1 tablet by mouth daily.     Turmeric 500 MG CAPS Take 500 mg by mouth daily.     venlafaxine XR (EFFEXOR-XR) 37.5 MG 24 hr capsule TAKE 1 CAPSULE (37.5 MG TOTAL) BY MOUTH DAILY WITH BREAKFAST. FOR HOT FLASHES AND ANXIETY 90 capsule 0   No current facility-administered medications for this visit.    REVIEW OF SYSTEMS:    10 Point review of Systems was done is negative except as noted above.  PHYSICAL EXAMINATION: ECOG PERFORMANCE STATUS: 1 - Symptomatic but completely ambulatory  . Vitals:   08/20/21 1301  BP: (!) 174/90  Pulse: 95  Resp:  18  Temp: 98.3 F (36.8 C)   SpO2: 100%   Filed Weights   08/20/21 1301  Weight: 240 lb 4.8 oz (109 kg)   .Body mass index is 41.25 kg/m.  GENERAL:alert, in no acute distress and comfortable SKIN: no acute rashes, no significant lesions EYES: conjunctiva are pink and non-injected, sclera anicteric OROPHARYNX: MMM, no exudates, no oropharyngeal erythema or ulceration NECK: supple, no JVD LYMPH:  no palpable lymphadenopathy in the cervical, axillary or inguinal regions LUNGS: clear to auscultation b/l with normal respiratory effort HEART: regular rate & rhythm ABDOMEN:  normoactive bowel sounds , non tender, not distended. Extremity: no pedal edema PSYCH: alert & oriented x 3 with fluent speech NEURO: no focal motor/sensory deficits  LABORATORY DATA:  I have reviewed the data as listed  . CBC Latest Ref Rng & Units 07/28/2021 05/01/2021 04/07/2021  WBC 4.0 - 10.5 K/uL 6.3 6.0 6.2  Hemoglobin 12.0 - 15.0 g/dL 13.6 13.9 13.2  Hematocrit 36.0 - 46.0 % 41.3 41.5 40.6  Platelets 150 - 400 K/uL 168 159.0 130(L)    . CMP Latest Ref Rng & Units 07/28/2021 05/01/2021 04/07/2021  Glucose 70 - 99 mg/dL 102(H) 100(H) 109(H)  BUN 6 - 20 mg/dL 18 20 22(H)  Creatinine 0.44 - 1.00 mg/dL 0.57 0.81 0.76  Sodium 135 - 145 mmol/L 137 141 139  Potassium 3.5 - 5.1 mmol/L 4.4 4.5 3.8  Chloride 98 - 111 mmol/L 103 104 104  CO2 22 - 32 mmol/L _0 Calcium 8.9 - 10.3 mg/dL 9.3 9.7 9.2  Total Protein 6.0 - 8.3 g/dL - 7.0 -  Total Bilirubin 0.2 - 1.2 mg/dL - 0.4 -  Alkaline Phos 39 - 117 U/L - 103 -  AST 0 - 37 U/L - 22 -  ALT 0 - 35 U/L - 23 -   SURGICAL PATHOLOGY  CASE: [id number]  PATIENT: Virginia Oneal  Surgical Pathology Report      Clinical History: lesion of thoracic vertebra (cm)      FINAL MICROSCOPIC DIAGNOSIS:   A-C. BONE, T12 VERTEBRAL BODY AND MARROW, BIOPSY:  -  Non-Hodgkin B-cell lymphoma, high grade  -  See comment   COMMENT:   A-C.  The biopsies show normal hematopoeisis admixed with  crush artifact  with associated marrow fibrosis and an atypical cellular infiltrate  composed of medium to large cells with irregular nuclear contours, ample  cytoplasm, variably prominent nucleoli, increased mitoses and apoptosis.  An initial panel of immunohistochemistry (cytokeratin AE1/3, cytokeratin  7, cytokeratin 20, CD20 and CD3) confirmed the morphologic impression  that the atypical cells were lymphoid in nature.  The atypical  lymphocytes are positive for Pax-5, CD20, CD10, bcl-6 but negative for  CD30, bcl-2, mum-1 and EBV by in-situ hybridization. CD3 and CD5  highlight background T-cells.  The proliferative rate by ki-67 is  increased (50-60%).  Overall, the findings are consistent with a  non-Hodgkin B-cell lymphoma, high grade with a germinal center  phenotype.   FISH studies are pending and will be reported in an  addendum.  These findings were discussed with Dr. Ellene Route on August 06, 2021.   RADIOGRAPHIC STUDIES: I have personally reviewed the radiological images as listed and agreed with the findings in the report. DG THORACOLUMABAR SPINE  Result Date: 07/31/2021 CLINICAL DATA:  T12 biopsy EXAM: THORACOLUMBAR SPINE 1V COMPARISON:  07/04/2021 FINDINGS: 3 C-arm fluoroscopic images were obtained intraoperatively and submitted for post operative interpretation. Fluoroscopic images demonstrate surgical devices localized to  the T12 vertebral body level. 16 seconds of fluoroscopy time was utilized. Please see the performing provider's procedural report for further detail. IMPRESSION: As above. Electronically Signed   By: Davina Poke D.O.   On: 07/31/2021 11:27   DG C-Arm 1-60 Min-No Report  Result Date: 07/31/2021 Fluoroscopy was utilized by the requesting physician.  No radiographic interpretation.    ASSESSMENT & PLAN:   55 year old female with  1) Newly diagnosed high-grade large B cell non-Hodgkin's lymphoma.  Germinal center phenotype.  C-Myc not rearranged.   Ki-67 50 to 60% No overt constitutional symptoms PLAN -Patient's clinical course labs and imaging findings available were discussed in details. -We discussed in detail her pathology findings and new diagnosis of high-grade B cell non-Hodgkin's lymphoma germinal center phenotype. -We discussed typical work-up including staging, overall treatment philosophy, need for labs for treatment planning, baseline echocardiogram. -PET/CT for staging -Echo to evaluate heart function prior to consideration of anthracycline chemotherapy -Patient would likely be treated with R-CHOP with G-CSF support.  Number of cycles will be based on staging PET scan. -Port-A-Cath placement for chemotherapy -Baseline labs relating hepatitis and HIV testing -Work-up ordered as noted below -Patient had several questions which were answered in detail.  Follow-up Labs today PET CT scan in 5 days Echo in 3 to 5 days IR for Port-A-Cath placement in 1 week MD visit with Dr. Irene Limbo in 10 days Chemo counseling for R-CHOP Please schedule to start R-CHOP with port flush and labs in 2 weeks   . Orders Placed This Encounter  Procedures   IR IMAGING GUIDED PORT INSERTION    Standing Status:   Future    Standing Expiration Date:   08/20/2022    Order Specific Question:   Reason for Exam (SYMPTOM  OR DIAGNOSIS REQUIRED)    Answer:   Port-A-Cath placement for chemotherapy for newly diagnosed large B-cell lymphoma    Order Specific Question:   Is the patient pregnant?    Answer:   No    Order Specific Question:   Preferred Imaging Location?    Answer:   [facility]   NM PET Image Initial (PI) Skull Base To Thigh    Standing Status:   Future    Standing Expiration Date:   08/20/2022    Order Specific Question:   If indicated for the ordered procedure, I authorize the administration of a radiopharmaceutical per Radiology protocol    Answer:   Yes    Order Specific Question:   Is the patient pregnant?    Answer:   No     Order Specific Question:   Preferred imaging location?    Answer:   [facility]   CBC with Differential/Platelet    Standing Status:   Future    Number of Occurrences:   1    Standing Expiration Date:   08/20/2022   CMP ([facility] only)    Standing Status:   Future    Number of Occurrences:   1    Standing Expiration Date:   08/20/2022   Lactate dehydrogenase    Standing Status:   Future    Number of Occurrences:   1    Standing Expiration Date:   08/20/2022   Hepatitis C antibody    Standing Status:   Future    Number of Occurrences:   1    Standing Expiration Date:   08/20/2022   Hepatitis B core antibody, total    Standing Status:   Future  Number of Occurrences:   1    Standing Expiration Date:   08/20/2022   Hepatitis B surface antigen    Standing Status:   Future    Number of Occurrences:   1    Standing Expiration Date:   08/20/2022   HIV Antibody (routine testing w rflx)    Standing Status:   Future    Number of Occurrences:   1    Standing Expiration Date:   08/20/2022   Multiple Myeloma Panel (SPEP&IFE w/QIG)    Standing Status:   Future    Number of Occurrences:   1    Standing Expiration Date:   08/20/2022   ECHOCARDIOGRAM COMPLETE    Standing Status:   Future    Standing Expiration Date:   08/20/2022    Order Specific Question:   Where should this test be performed    Answer:   [facility]    Order Specific Question:   Perflutren DEFINITY (image enhancing agent) should be administered unless hypersensitivity or allergy exist    Answer:   Administer Perflutren    Order Specific Question:   Reason for exam-Echo    Answer:   Chemo  Z09    Order Specific Question:   Other Comments    Answer:   Newly diagnosed large B-cell lymphoma needing anthracycline based chemoimmunotherapy to evaluate cardiac function and EF prior to potentially cardiotoxic chemotherapy    All of the patients questions were answered with apparent satisfaction. The patient knows to call the  clinic with any problems, questions or concerns.  I spent  40 mins counseling the patient face to face. The total time spent in the appointment was 60 mins  and more than 50% was on counseling and direct patient cares.    Sullivan Lone MD MS AAHIVMS [facility] Hematology/Oncology Physician [place]   08/20/2021 1:32 PM

## 2021-08-20 NOTE — Patient Instructions (Signed)
Thank you for choosing [facility] to provide your care.   Should you have questions after your visit to the [facility] ([facility]), please contact this office at [phone] between 8:30 AM and 4:30 PM.  Voice mails left after 4:00 PM may not be returned until the following business day.  Calls received after 4:30 PM will be answered by an off-site Nurse Triage Line.    Prescription Refills:  Please have your pharmacy contact us directly for most prescription requests.  Contact the office directly for refills of narcotics (pain medications). Allow 48-72 hours for refills.  Appointments: Please contact the [facility] scheduling department [phone] for questions regarding [facility] appointment scheduling.  Contact the schedulers with any scheduling changes so that your appointment can be rescheduled in a timely manner.   Central Scheduling for [facility] [phone] - Call to schedule procedures such as PET scans, CT scans, MRI, Ultrasound, etc.  To afford each patient quality time with our providers, please arrive 30 minutes before your scheduled appointment time.  If you arrive late for your appointment, you may be asked to reschedule.  We strive to give you quality time with our providers, and arriving late affects you and other patients whose appointments are after yours. If you are a no show for multiple scheduled visits, you may be dismissed from the clinic at the providers discretion.     Resources: [facility] Social Workers [phone] for additional information on assistance programs or assistance connecting with community support programs   [place] DSS  [phone]: Information regarding food stamps, Medicaid, and utility assistance [place] Access [place] [phone]   [place] [employer]'s shared-ride transportation service for eligible riders who have a disability that prevents them from riding the fixed route bus.   Medicare Rights Center 800-333-4114  Helps people with Medicare understand their rights and benefits, navigate the Medicare system, and secure the quality healthcare they deserve [employer] 800-227-2345 Assists patients locate various types of support and financial assistance Cancer Care: 1-800-813-HOPE (4673) Provides financial assistance, online support groups, medication/co-pay assistance.   Transportation Assistance for appointments at [facility]: Transportation Coordinator [phone]  Again, thank you for choosing [facility] for your care.       

## 2021-08-21 ENCOUNTER — Other Ambulatory Visit: Payer: Self-pay | Admitting: Primary Care

## 2021-08-21 ENCOUNTER — Ambulatory Visit (INDEPENDENT_AMBULATORY_CARE_PROVIDER_SITE_OTHER): Payer: 59

## 2021-08-21 DIAGNOSIS — F419 Anxiety disorder, unspecified: Secondary | ICD-10-CM

## 2021-08-21 DIAGNOSIS — R232 Flushing: Secondary | ICD-10-CM

## 2021-08-21 DIAGNOSIS — Z23 Encounter for immunization: Secondary | ICD-10-CM | POA: Diagnosis not present

## 2021-08-21 NOTE — Progress Notes (Signed)
Per orders of Alma Friendly Np, injection of Zoster was given by Ophelia Shoulder. Patient tolerated injection well.

## 2021-08-25 LAB — MULTIPLE MYELOMA PANEL, SERUM
Albumin SerPl Elph-Mcnc: 3.9 g/dL (ref 2.9–4.4)
Albumin/Glob SerPl: 1.4 (ref 0.7–1.7)
Alpha 1: 0.2 g/dL (ref 0.0–0.4)
Alpha2 Glob SerPl Elph-Mcnc: 0.7 g/dL (ref 0.4–1.0)
B-Globulin SerPl Elph-Mcnc: 1.3 g/dL (ref 0.7–1.3)
Gamma Glob SerPl Elph-Mcnc: 0.8 g/dL (ref 0.4–1.8)
Globulin, Total: 2.9 g/dL (ref 2.2–3.9)
IgA: 337 mg/dL (ref 87–352)
IgG (Immunoglobin G), Serum: 789 mg/dL (ref 586–1602)
IgM (Immunoglobulin M), Srm: 51 mg/dL (ref 26–217)
Total Protein ELP: 6.8 g/dL (ref 6.0–8.5)

## 2021-08-27 ENCOUNTER — Telehealth: Payer: Self-pay | Admitting: Hematology

## 2021-08-27 ENCOUNTER — Inpatient Hospital Stay: Payer: 59

## 2021-08-27 NOTE — Telephone Encounter (Signed)
Scheduled follow-up appointments per 1/19 los. Patient is aware. °

## 2021-08-29 ENCOUNTER — Other Ambulatory Visit (HOSPITAL_COMMUNITY): Payer: Self-pay | Admitting: Physician Assistant

## 2021-08-31 ENCOUNTER — Other Ambulatory Visit: Payer: Self-pay | Admitting: Radiology

## 2021-09-01 ENCOUNTER — Other Ambulatory Visit: Payer: Self-pay

## 2021-09-01 ENCOUNTER — Other Ambulatory Visit: Payer: Self-pay | Admitting: Hematology

## 2021-09-01 ENCOUNTER — Ambulatory Visit (HOSPITAL_COMMUNITY)
Admission: RE | Admit: 2021-09-01 | Discharge: 2021-09-01 | Disposition: A | Discharge status: Home / Self Care | Payer: 59 | Source: Ambulatory Visit | Attending: Hematology | Admitting: Hematology

## 2021-09-01 ENCOUNTER — Observation Stay (HOSPITAL_COMMUNITY)
Admission: RE | Admit: 2021-09-01 | Discharge: 2021-09-01 | Disposition: A | Discharge status: Home / Self Care | Payer: 59 | Source: Ambulatory Visit | Attending: Hematology | Admitting: Hematology

## 2021-09-01 ENCOUNTER — Ambulatory Visit: Payer: 59 | Admitting: Hematology

## 2021-09-01 ENCOUNTER — Encounter (HOSPITAL_COMMUNITY): Payer: Self-pay

## 2021-09-01 DIAGNOSIS — C8330 Diffuse large B-cell lymphoma, unspecified site: Secondary | ICD-10-CM

## 2021-09-01 DIAGNOSIS — R0609 Other forms of dyspnea: Secondary | ICD-10-CM | POA: Insufficient documentation

## 2021-09-01 DIAGNOSIS — F32A Depression, unspecified: Secondary | ICD-10-CM | POA: Insufficient documentation

## 2021-09-01 DIAGNOSIS — I10 Essential (primary) hypertension: Secondary | ICD-10-CM | POA: Insufficient documentation

## 2021-09-01 DIAGNOSIS — F419 Anxiety disorder, unspecified: Secondary | ICD-10-CM | POA: Diagnosis not present

## 2021-09-01 DIAGNOSIS — Z7189 Other specified counseling: Secondary | ICD-10-CM

## 2021-09-01 HISTORY — PX: IR IMAGING GUIDED PORT INSERTION: IMG5740

## 2021-09-01 MED ORDER — FENTANYL CITRATE (PF) 100 MCG/2ML IJ SOLN
INTRAMUSCULAR | Status: AC
  Filled 2021-09-01: qty 2

## 2021-09-01 MED ORDER — HEPARIN SOD (PORK) LOCK FLUSH 100 UNIT/ML IV SOLN
INTRAVENOUS | Status: AC
  Filled 2021-09-01: qty 5

## 2021-09-01 MED ORDER — SODIUM CHLORIDE 0.9 % IV SOLN: INTRAVENOUS | Status: DC

## 2021-09-01 MED ORDER — MIDAZOLAM HCL 2 MG/2ML IJ SOLN
INTRAMUSCULAR | Status: DC | PRN
  Administered 2021-09-01: 1 mg via INTRAVENOUS

## 2021-09-01 MED ORDER — FENTANYL CITRATE (PF) 100 MCG/2ML IJ SOLN
INTRAMUSCULAR | Status: DC | PRN
  Administered 2021-09-01: 50 ug via INTRAVENOUS

## 2021-09-01 MED ORDER — LIDOCAINE-EPINEPHRINE (PF) 2 %-1:200000 IJ SOLN
INTRAMUSCULAR | Status: DC | PRN
  Administered 2021-09-01: 10 mL via INTRADERMAL

## 2021-09-01 MED ORDER — LIDOCAINE-EPINEPHRINE (PF) 2 %-1:200000 IJ SOLN
INTRAMUSCULAR | Status: AC
  Filled 2021-09-01: qty 20

## 2021-09-01 MED ORDER — HEPARIN SOD (PORK) LOCK FLUSH 100 UNIT/ML IV SOLN
INTRAVENOUS | Status: DC | PRN
  Administered 2021-09-01: 500 [IU] via INTRAVENOUS

## 2021-09-01 MED ORDER — MIDAZOLAM HCL 2 MG/2ML IJ SOLN
INTRAMUSCULAR | Status: AC
  Filled 2021-09-01: qty 4

## 2021-09-01 NOTE — Discharge Instructions (Signed)
For questions /concerns may call Interventional Radiology at [phone] ° °You may remove your dressing and shower tomorrow afternoon ° °DO NOT use EMLA cream for 2 weeks after port placement as the cream will remove surgical glue on your incision.    ° ° °Implanted Port Insertion, Care After °This sheet gives you information about how to care for yourself after your procedure. Your health care provider may also give you more specific instructions. If you have problems or questions, contact your health careprovider. °What can I expect after the procedure? °After the procedure, it is common to have: °Discomfort at the port insertion site. °Bruising on the skin over the port. This should improve over 3-4 days. °Follow these instructions at home: °Port care °After your port is placed, you will get a manufacturer's information card. The card has information about your port. Keep this card with you at all times. °Take care of the port as told by your health care provider. Ask your health care provider if you or a family member can get training for taking care of the port at home. A home health care nurse may also take care of the port. °Make sure to remember what type of port you have. °Incision care °Follow instructions from your health care provider about how to take care of your port insertion site. Make sure you: °Wash your hands with soap and water before and after you change your bandage (dressing). If soap and water are not available, use hand sanitizer. °Change your dressing as told by your health care provider. °Leave skin glue, or adhesive strips in place. These skin closures may need to stay in place for 2 weeks or longer.  °Check your port insertion site every day for signs of infection. Check for: °     - Redness, swelling, or pain. °                    - Fluid or blood. °     - Warmth. °     - Pus or a bad smell. °Activity °Return to your normal activities as told by your health care provider. Ask your  health care provider what activities are safe for you. °Do not lift anything that is heavier than 10 lb (4.5 kg), or the limit that you are told, until your health care provider says that it is safe. °General instructions °Take over-the-counter and prescription medicines only as told by your health care provider. °Do not take baths, swim, or use a hot tub until your health care provider approves. Ask your health care provider if you may take showers. You may only be allowed to take sponge baths. °Do not drive for 24 hours if you were given a sedative during your procedure. °Wear a medical alert bracelet in case of an emergency. This will tell any health care providers that you have a port. °Keep all follow-up visits as told by your health care provider. This is important. °Contact a health care provider if: °You cannot flush your port with saline as directed, or you cannot draw blood from the port. °You have a fever or chills. °You have redness, swelling, or pain around your port insertion site. °You have fluid or blood coming from your port insertion site. °Your port insertion site feels warm to the touch. °You have pus or a bad smell coming from the port insertion site. °Get help right away if: °You have chest pain or shortness of breath. °You have bleeding from   your port that you cannot control. °Summary °Take care of the port as told by your health care provider. Keep the manufacturer's information card with you at all times. °Change your dressing as told by your health care provider. °Contact a health care provider if you have a fever or chills or if you have redness, swelling, or pain around your port insertion site. °Keep all follow-up visits as told by your health care provider. °This information is not intended to replace advice given to you by your health care provider. Make sure you discuss any questions you have with your healthcare provider. ° °Moderate Conscious Sedation, Adult, Care After °This sheet  gives you information about how to care for yourself after your procedure. Your health care provider may also give you more specific instructions. If you have problems or questions, contact your health careprovider. °What can I expect after the procedure? °After the procedure, it is common to have: °Sleepiness for several hours. °Impaired judgment for several hours. °Difficulty with balance. °Vomiting if you eat too soon. °Follow these instructions at home: °For the time period you were told by your health care provider: °Rest. °Do not participate in activities where you could fall or become injured. °Do not drive or use machinery. °Do not drink alcohol. °Do not take sleeping pills or medicines that cause drowsiness. °Do not make important decisions or sign legal documents. °Do not take care of children on your own. °Eating and drinking ° °Follow the diet recommended by your health care provider. °Drink enough fluid to keep your urine pale yellow. °If you vomit: °Drink water, juice, or soup when you can drink without vomiting. °Make sure you have little or no nausea before eating solid foods. ° °General instructions °Take over-the-counter and prescription medicines only as told by your health care provider. °Have a responsible adult stay with you for the time you are told. It is important to have someone help care for you until you are awake and alert. °Do not smoke. °Keep all follow-up visits as told by your health care provider. This is important. °Contact a health care provider if: °You are still sleepy or having trouble with balance after 24 hours. °You feel light-headed. °You keep feeling nauseous or you keep vomiting. °You develop a rash. °You have a fever. °You have redness or swelling around the IV site. °Get help right away if: °You have trouble breathing. °You have new-onset confusion at home. °Summary °After the procedure, it is common to feel sleepy, have impaired judgment, or feel nauseous if you eat  too soon. °Rest after you get home. Know the things you should not do after the procedure. °Follow the diet recommended by your health care provider and drink enough fluid to keep your urine pale yellow. °Get help right away if you have trouble breathing or new-onset confusion at home. °This information is not intended to replace advice given to you by your health care provider. Make sure you discuss any questions you have with your healthcare provider. °Document Revised: 01/26/2020 Document Reviewed: 08/24/2019 °Elsevier Patient Education © 2022 [employer]  °

## 2021-09-01 NOTE — H&P (Signed)
Chief Complaint: Patient was seen in consultation today for image guided Port-A-Cath placement at the request of Brunetta Genera  Referring Physician(s): Brunetta Genera  Supervising Physician: Ruthann Cancer  Patient Status: [facility] - Out-pt  History of Present Illness: Virginia Oneal is a 55 y.o. female with PMH of anemia, anxiety, T12 vertebral body mass,  chest pain, depression, DOE, abnormal uterine bleeding, HTN, and gestational diabetes.  Patient had recent diagnosis of high-grade B-cell lymphoma.  She was referred here by Dr. Irene Limbo for Port-A-Cath placement she can begin chemotherapy.  Past Medical History:  Diagnosis Date   Abnormal uterine bleeding    Anemia    as a teenager   Anxiety    postpartum and hx panic attacks   Arrhythmia    Arthritis    back and hands   Chest mass    by CT scanning   Chest pain 24/26/8341   Complication of anesthesia    slow to wake up   COVID-19 10/2020   Depression    DOE (dyspnea on exertion) 07/26/2014   Family history of adverse reaction to anesthesia    mom slow to wake up   Gestational diabetes    resolved after pregnancy   History of kidney stones    Hypertension    Personal history of colonic polyps    PONV (postoperative nausea and vomiting)    PREMATURE ATRIAL CONTRACTIONS 08/15/2008   Qualifier: Diagnosis of  By: Council Mechanic MD, Hilaria Ota    RENAL CALCULUS, HX OF 08/15/2008   Qualifier: Diagnosis of  By: Zara Council LPN, Wanda     Rotator cuff tendinitis 06/29/2011   Seasonal allergies    Urinary incontinence 05/2009   hx bladder sling--Dr. Quincy Simmonds    Past Surgical History:  Procedure Laterality Date   ABDOMINAL HYSTERECTOMY  05/2009   TAH/LSO   BLADDER SUSPENSION  2010   Dr. Quincy Simmonds   BREAST BIOPSY     left stereo   CESAREAN SECTION  03/2003   CESAREAN SECTION  2004   CHOLECYSTECTOMY N/A 01/02/2021   Procedure: LAPAROSCOPIC CHOLECYSTECTOMY;  Surgeon: Rolm Bookbinder, MD;  Location: [facility];  Service: General;   Laterality: N/A;  58  - GEN AND TAP BLOCK LDOW   COLONOSCOPY  09/08   COLPORRHAPHY  05/2009   Dr. Quincy Simmonds   ENDOMETRIAL ABLATION  2006   Dr. Quincy Simmonds   INCONTINENCE SURGERY     sling for urinary incontinence   KYPHOPLASTY N/A 04/07/2021   Procedure: Thoracic 12 Vertebral body biopsy;  Surgeon: Kristeen Miss, MD;  Location: [place];  Service: Neurosurgery;  Laterality: N/A;   LAPAROSCOPIC ENDOMETRIOSIS FULGURATION     ablation   LUMBAR LAMINECTOMY/DECOMPRESSION MICRODISCECTOMY N/A 07/31/2021   Procedure: Open Transpedicular biopsy of Thoracic twelve;  Surgeon: Kristeen Miss, MD;  Location: [place];  Service: Neurosurgery;  Laterality: N/A;   OOPHORECTOMY  2010   w/hyst--Dr. Quincy Simmonds   TUBAL LIGATION  2004   w/ C-section Dr. Quincy Simmonds   TYMPANOPLASTY     right ear   VAGINAL HYSTERECTOMY  05/21/2009   lap assisted /partial    Allergies: Buspar [buspirone hcl]  Medications: Prior to Admission medications   Medication Sig Start Date End Date Taking? Authorizing Provider  acetaminophen (TYLENOL) 500 MG tablet Take 1,000 mg by mouth every 6 (six) hours as needed for moderate pain or headache.   Yes [provider]  Biotin 10000 MCG TABS Take 10,000 mcg by mouth daily.   Yes [provider]  [facility] Cohosh 40  MG CAPS Take 40 mg by mouth daily.   Yes [provider]  cetirizine (ZYRTEC) 10 MG tablet Take 10 mg by mouth daily.  01/10/18  Yes [provider]  Cholecalciferol (VITAMIN D3) 125 MCG (5000 UT) CAPS Take 5,000 Units by mouth daily.   Yes [provider]  COVID-19 mRNA bivalent vaccine, Moderna, (MODERNA COVID-19 BIVAL BOOSTER) 50 MCG/0.5ML injection Inject into the muscle. 07/18/21  Yes Carlyle Basques, MD  hydrochlorothiazide (HYDRODIURIL) 25 MG tablet Take 1 tablet (25 mg total) by mouth daily. For blood pressure. 05/13/21  Yes Pleas Koch, NP  hydrOXYzine (ATARAX/VISTARIL) 10 MG tablet Take 1 or 2 tablets by mouth as needed for anxiety attack.  05/01/21  Yes Pleas Koch, NP  ketorolac (TORADOL) 10 MG tablet Take 1 tablet (10 mg total) by mouth every 6 (six) hours as needed for moderate pain or severe pain. 07/31/21  Yes Kristeen Miss, MD  LORazepam (ATIVAN) 0.5 MG tablet Take 1 tablet (0.5 mg total) by mouth every 12 (twelve) hours as needed for anxiety. 04/10/20  Yes Elby Beck, FNP  Maca 500 MG CAPS Take 500 mg by mouth daily.   Yes [provider]  Methylcobalamin (B-12) 5000 MCG TBDP Take 5,000 mcg by mouth daily.   Yes [provider]  Prenatal Vit-Fe Fumarate-FA (PRENATAL MULTIVITAMIN) TABS tablet Take 1 tablet by mouth daily.   Yes [provider]  Turmeric 500 MG CAPS Take 500 mg by mouth daily.   Yes [provider]  venlafaxine XR (EFFEXOR-XR) 37.5 MG 24 hr capsule TAKE 1 CAPSULE (37.5 MG TOTAL) BY MOUTH DAILY WITH BREAKFAST. FOR HOT FLASHES AND ANXIETY 08/21/21  Yes Pleas Koch, NP  betamethasone valerate ointment (VALISONE) 0.1 % Apply 1 application topically 2 (two) times daily. Patient taking differently: Apply 1 application topically 2 (two) times daily as needed (rash). 04/10/20   Elby Beck, FNP  HYDROcodone-acetaminophen (NORCO) 5-325 MG tablet Take 1-2 tablets by mouth every 6 (six) hours as needed for moderate pain or severe pain. 07/31/21   Kristeen Miss, MD  influenza vac split quadrivalent PF (FLUARIX) 0.5 ML injection Inject into the muscle. 07/25/21   Carlyle Basques, MD     Family History  Problem Relation Age of Onset   Kidney disease Mother        renal failure   Stroke Mother    Diabetes Mother    Hypertension Mother    Heart failure Mother    Colon cancer Mother    Stroke Father    Cancer - Colon Maternal Aunt    Hypertension Sister    Rheum arthritis Sister    Hypertension Brother    Hypertension Sister    Hypertension Sister    Hypertension Sister    Breast cancer Sister    Kidney cancer Sister    Esophageal cancer Neg Hx     Rectal cancer Neg Hx    Stomach cancer Neg Hx     Social History   Socioeconomic History   Marital status: Married    Spouse name: Not on file   Number of children: 2   Years of education: Not on file   Highest education level: Not on file  Occupational History   Occupation: [employer]  Tobacco Use   Smoking status: Former    Years: 10.00    Types: Cigarettes    Quit date: 01/06/1999    Years since quitting: 22.6   Smokeless tobacco: Never  Vaping Use  Vaping Use: Never used  Substance and Sexual Activity   Alcohol use: No    Alcohol/week: 0.0 standard drinks   Drug use: No   Sexual activity: Yes    Partners: Male    Birth control/protection: Other-see comments, Surgical    Comment: TAH/LSO  Other Topics Concern   Not on file  Social History Narrative   ** Merged History Encounter **       Social Determinants of Health   Financial Resource Strain: Not on file  Food Insecurity: Not on file  Transportation Needs: Not on file  Physical Activity: Not on file  Stress: Not on file  Social Connections: Not on file     Review of Systems: A 12 point ROS discussed and pertinent positives are indicated in the HPI above.  All other systems are negative.  Review of Systems  Constitutional:  Negative for chills and fever.  HENT:  Negative for nosebleeds.   Eyes:  Negative for visual disturbance.  Respiratory:  Negative for cough and shortness of breath.   Cardiovascular:  Negative for chest pain and leg swelling.  Gastrointestinal:  Negative for abdominal pain, blood in stool, nausea and vomiting.  Genitourinary:  Negative for hematuria.  Neurological:  Negative for dizziness, light-headedness and headaches.   Vital Signs: BP (!) 167/96   Pulse 91   Temp 98.1 F (36.7 C) (Oral)   Resp 16   Ht 5\' 4"  (1.626 m)   Wt 240 lb 4.8 oz (109 kg)   SpO2 100%   BMI 41.25 kg/m   Physical Exam Constitutional:      Appearance: Normal appearance. She is not  ill-appearing.  HENT:     Head: Normocephalic and atraumatic.     Mouth/Throat:     Mouth: Mucous membranes are moist.     Pharynx: Oropharynx is clear.  Eyes:     Pupils: Pupils are equal, round, and reactive to light.  Cardiovascular:     Rate and Rhythm: Normal rate and regular rhythm.     Pulses: Normal pulses.     Heart sounds: No murmur heard.   No friction rub. No gallop.  Pulmonary:     Effort: Pulmonary effort is normal. No respiratory distress.     Breath sounds: Normal breath sounds. No stridor. No wheezing, rhonchi or rales.  Abdominal:     General: Bowel sounds are normal.     Palpations: Abdomen is soft.     Tenderness: There is no abdominal tenderness. There is no guarding.  Musculoskeletal:     Right lower leg: No edema.     Left lower leg: No edema.  Skin:    General: Skin is warm and dry.  Neurological:     Mental Status: She is alert and oriented to person, place, and time.  Psychiatric:        Mood and Affect: Mood normal.        Behavior: Behavior normal.        Thought Content: Thought content normal.        Judgment: Judgment normal.    Imaging: No results found.  Labs:  CBC: Recent Labs    04/07/21 0553 05/01/21 0859 07/28/21 0900 08/20/21 1442  WBC 6.2 6.0 6.3 6.7  HGB 13.2 13.9 13.6 13.4  HCT 40.6 41.5 41.3 39.5  PLT 130* 159.0 168 171    COAGS: No results for input(s): INR, APTT in the last 8760 hours.  BMP: Recent Labs    11/18/20 1607 11/20/20 0934  12/30/20 0829 04/07/21 0553 05/01/21 0859 07/28/21 0900 08/20/21 1442  NA 141   < > 143 139 141 137 139  K 4.2   < > 4.1 3.8 4.5 4.4 3.5  CL 105   < > 107 104 104 103 104  CO2 22   < > 29 28 30 30 27   GLUCOSE 94   < > 101* 109* 100* 102* 101*  BUN 15   < > 23* 22* 20 18 16   CALCIUM 9.3   < > 9.6 9.2 9.7 9.3 9.6  CREATININE 0.57   < > 0.68 0.76 0.81 0.57 0.70  GFRNONAA 104  --  >60 >60  --  >60 >60  GFRAA 120  --   --   --   --   --   --    < > = values in this interval  not displayed.    LIVER FUNCTION TESTS: Recent Labs    11/18/20 1607 11/20/20 0934 05/01/21 0859 08/20/21 1442  BILITOT 0.7 0.8 0.4 0.3  AST 324* 82* 22 21  ALT 756* 384* 23 24  ALKPHOS 285* 192* 103 124  PROT 6.6 6.9 7.0 7.2  ALBUMIN 4.4 4.3 4.3 4.0    TUMOR MARKERS: No results for input(s): AFPTM, CEA, CA199, CHROMGRNA in the last 8760 hours.  Assessment and Plan: History of anemia, anxiety, T12 vertebral body mass,  chest pain, depression, DOE, abnormal uterine bleeding, HTN, and gestational diabetes.  Patient had recent diagnosis of high-grade B-cell lymphoma.  She was referred here by Dr. Irene Limbo for Port-A-Cath placement she can begin chemotherapy. Pt resting quietly on stretcher.  She is is A&O, calm and pleasant. She is in no distress.  Pt states she is NPO per order.  No labs needed for today. VSS.   Risks and benefits of image guided port-a-catheter placement was discussed with the patient including, but not limited to bleeding, infection, pneumothorax, or fibrin sheath development and need for additional procedures.  All of the patient's questions were answered, patient is agreeable to proceed. Consent signed and in chart.   Thank you for this interesting consult.  I greatly enjoyed meeting [employer] [address] and look forward to participating in their care.  A copy of this report was sent to the requesting provider on this date.  Electronically Signed: Tyson Alias, NP 09/01/2021, 9:02 AM   I spent a total of 30-minute in face to face in clinical consultation, greater than 50% of which was counseling/coordinating care for Port-A-Cath placement.

## 2021-09-01 NOTE — Procedures (Signed)
Interventional Radiology Procedure Note ° °Procedure: Single Lumen Power Port Placement   ° °Access:  Right internal jugular vein ° °Findings: Catheter tip positioned at cavoatrial junction. Port is ready for immediate use.  ° °Complications: None ° °EBL: < 10 mL ° °Recommendations:  °- Ok to shower in 24 hours °- Do not submerge for 7 days °- Routine line care  ° ° °Jenesys Casseus, MD ° ° ° °

## 2021-09-01 NOTE — Progress Notes (Signed)
START ON PATHWAY REGIMEN - Lymphoma and CLL     A cycle is every 21 days:     Prednisone      Rituximab-xxxx      Cyclophosphamide      Doxorubicin      Vincristine   **Always confirm dose/schedule in your pharmacy ordering system**  Patient Characteristics: Diffuse Large B-Cell Lymphoma or Follicular Lymphoma, Grade 3B, First Line, Stage I and II, No Bulk Disease Type: Not Applicable Disease Type: Diffuse Large B-Cell Lymphoma Disease Type: Not Applicable Line of therapy: First Line Disease Characteristics: No Bulk Intent of Therapy: Curative Intent, Discussed with Patient 

## 2021-09-02 ENCOUNTER — Ambulatory Visit (HOSPITAL_COMMUNITY)
Admission: RE | Admit: 2021-09-02 | Discharge: 2021-09-02 | Disposition: A | Discharge status: Home / Self Care | Payer: 59 | Source: Ambulatory Visit | Attending: Hematology | Admitting: Hematology

## 2021-09-02 ENCOUNTER — Other Ambulatory Visit: Payer: 59

## 2021-09-02 DIAGNOSIS — C8330 Diffuse large B-cell lymphoma, unspecified site: Secondary | ICD-10-CM | POA: Diagnosis not present

## 2021-09-02 LAB — GLUCOSE, CAPILLARY: Glucose-Capillary: 110 mg/dL — ABNORMAL HIGH (ref 70–99)

## 2021-09-02 MED ORDER — FLUDEOXYGLUCOSE F - 18 (FDG) INJECTION
11.8500 | Freq: Once | INTRAVENOUS | Status: AC | PRN
  Administered 2021-09-02: 11.85 via INTRAVENOUS

## 2021-09-06 LAB — SURGICAL PATHOLOGY

## 2021-09-08 ENCOUNTER — Inpatient Hospital Stay (HOSPITAL_BASED_OUTPATIENT_CLINIC_OR_DEPARTMENT_OTHER): Payer: 59 | Admitting: Hematology

## 2021-09-08 ENCOUNTER — Ambulatory Visit (HOSPITAL_COMMUNITY)
Admission: RE | Admit: 2021-09-08 | Discharge: 2021-09-08 | Disposition: A | Discharge status: Home / Self Care | Payer: 59 | Source: Ambulatory Visit | Attending: Hematology | Admitting: Hematology

## 2021-09-08 ENCOUNTER — Other Ambulatory Visit: Payer: Self-pay

## 2021-09-08 VITALS — BP 153/87 | HR 88 | Temp 97.7°F | Resp 20 | Wt 242.3 lb

## 2021-09-08 DIAGNOSIS — Z7189 Other specified counseling: Secondary | ICD-10-CM

## 2021-09-08 DIAGNOSIS — I3481 Nonrheumatic mitral (valve) annulus calcification: Secondary | ICD-10-CM | POA: Diagnosis not present

## 2021-09-08 DIAGNOSIS — C8330 Diffuse large B-cell lymphoma, unspecified site: Secondary | ICD-10-CM

## 2021-09-08 DIAGNOSIS — Z01818 Encounter for other preprocedural examination: Secondary | ICD-10-CM | POA: Insufficient documentation

## 2021-09-08 DIAGNOSIS — I340 Nonrheumatic mitral (valve) insufficiency: Secondary | ICD-10-CM | POA: Diagnosis not present

## 2021-09-08 DIAGNOSIS — Z0189 Encounter for other specified special examinations: Secondary | ICD-10-CM

## 2021-09-08 DIAGNOSIS — C8512 Unspecified B-cell lymphoma, intrathoracic lymph nodes: Secondary | ICD-10-CM | POA: Diagnosis not present

## 2021-09-08 LAB — ECHOCARDIOGRAM COMPLETE
AR max vel: 2.59 cm2
AV Area VTI: 2.76 cm2
AV Area mean vel: 2.61 cm2
AV Mean grad: 4 mmHg
AV Peak grad: 7 mmHg
Ao pk vel: 1.32 m/s
Area-P 1/2: 3.15 cm2
Calc EF: 56 %
MV VTI: 2.25 cm2
S' Lateral: 2.6 cm
Single Plane A2C EF: 54.9 %
Single Plane A4C EF: 58 %

## 2021-09-08 NOTE — Progress Notes (Signed)
[name]   HEMATOLOGY/ONCOLOGY CLINIC NOTE  Date of Service: 09/08/2021  Patient Care Team: Pleas Koch, NP as PCP - General (Internal Medicine) Kristeen Miss, MD as Consulting Physician (Neurosurgery) Rolm Bookbinder, MD as Consulting Physician (General Surgery)  CHIEF COMPLAINTS/PURPOSE OF CONSULTATION:  High grade large B cell lymphoma  HISTORY OF PRESENTING ILLNESS:   Virginia Oneal is a wonderful 55 y.o. female who has been referred to [name] by Dr Kristeen Miss MD for evaluation and management of newly diagnosed high grade B cell lymphoma.  Patient has a history of hypertension, seasonal allergies, gestational diabetes, depression/anxiety, COVID-19 infection in January 2022, kidney stones presented with gross hematuria for 3 days in February 2022.  She was evaluated by her primary care provider and had a CT abdomen and pelvis on 11/20/2020 which showed small nonobstructive calculus in the right kidney.  No suspicious urinary tract mass.  She was noted to have new sclerosis of the T12 vertebral body concerning for possible osseous metastatic disease.  There was no evidence of primary malignancy in the abdomen or pelvis. Patient subsequently had a bone scan on 12/23/2020 which showed 1. Intense activity within the posterior aspect of the T12 vertebralbody which extends in symmetric fashion through the LEFT and RIGHT pedicle. This is associated by sclerosis on the CT portion of the exam. The symmetric nature of the lesion a could indicate trauma. Recommend clinical correlation. Cannot exclude malignancy at this level. 2. No evidence of malignant lesions elsewhere on the whole-body scan.  Patient reports that she was referred to Dr. Kristeen Miss for further evaluation of her T12 lesion and had a MRI of the thoracic spine with and without contrast on 02/20/2021 which showed Heterogeneous Decreased T1/T2 signal in the T12 vertebral body associated with sclerosis of that vertebra on CT which is new  since 2020. There is a small enhancing extraosseous component mildly projecting into the ventral epidural space which appears 1 or 2 mm larger since February (series 10, image 7), but no dural involvement or spinal stenosis. And there is a second smaller but similar lesion in the posterior right T7 body, also more conspicuous since February.  There is a chronic and seemingly unrelated 4 cm T5-T6 left paraspinal cystic mass, stable since 2016.  Patient had a T12 vertebral body biopsy by Dr. Ellene Route on 04/07/2021 which was indeterminate and showed no overt malignancy.  Patient had a follow-up MRI of the thoracic spine on 07/04/2021 which showed 1. Mild interval progression of the T12 lesion, especially on the right where there is ventral epidural soft tissue which is non-compressive. Unchanged infiltrative lesion in the right T7 body and pedicle.  Patient had a repeat open transpedicular biopsy of T12 vertebra with Dr. Ellene Route on 07/31/2021 which showed high-grade non-Hodgkin's B-cell lymphoma with germinal center phenotype Ki-67 of 50 to 60%. Prior study did not show c-Myc rearrangement.  Patient was referred to medical oncology for further evaluation and management.  INTERVAL HISTORY  Patient is here for follow-up of her large B-cell lymphoma and to discuss the results of her additional labs PET scan echocardiogram and other questions prior to initiation of treatment. She is accompanied by her husband today.  Patient notes that she has had her Port-A-Cath placed uneventfully.  She notes some mild back discomfort but no other acute new focal symptoms..  Labs done on 08/20/2021 were reviewed in detail CBC within normal limits, CMP unremarkable, LDH slightly elevated at 228. Hepatitis testing unremarkable HIV nonreactive Myeloma panel shows no  M spike protein spike.  Echo cardiogram shows normal ejection fraction  PET CT scan done on 09/02/2021 shows Scattered mild hypermetabolism within  subcentimeter periportal, retroperitoneal and mesenteric lymph nodes, compatible with the given history of large B-cell lymphoma. 2. Hypermetabolism within known T7 and T12 lesions, better seen on MR thoracic spine 07/04/2021. 3. Focal hypermetabolism in the acromion of the right scapula, without a definite CT correlate. Recommend attention on follow-up.  She reports that the area of port insertion has been sore. Denies fevers and the site is not red and hot to touch. Not sure if it is because of the way she is lying down.   She is scheduled to have chemotherapy counseling and is scheduled to start her chemotherapy on 09/18/2021.    MEDICAL HISTORY:  Past Medical History:  Diagnosis Date   Abnormal uterine bleeding    Anemia    as a teenager   Anxiety    postpartum and hx panic attacks   Arrhythmia    Arthritis    back and hands   Chest mass    by CT scanning   Chest pain 16/07/9603   Complication of anesthesia    slow to wake up   COVID-19 10/2020   Depression    DOE (dyspnea on exertion) 07/26/2014   Family history of adverse reaction to anesthesia    mom slow to wake up   Gestational diabetes    resolved after pregnancy   History of kidney stones    Hypertension    Personal history of colonic polyps    PONV (postoperative nausea and vomiting)    PREMATURE ATRIAL CONTRACTIONS 08/15/2008   Qualifier: Diagnosis of  By: Council Mechanic MD, Hilaria Ota    RENAL CALCULUS, HX OF 08/15/2008   Qualifier: Diagnosis of  By: Zara Council LPN, Wanda     Rotator cuff tendinitis 06/29/2011   Seasonal allergies    Urinary incontinence 05/2009   hx bladder sling--Dr. Quincy Simmonds    SURGICAL HISTORY: Past Surgical History:  Procedure Laterality Date   ABDOMINAL HYSTERECTOMY  05/2009   TAH/LSO   BLADDER SUSPENSION  2010   Dr. Quincy Simmonds   BREAST BIOPSY     left stereo   CESAREAN SECTION  03/2003   CESAREAN SECTION  2004   CHOLECYSTECTOMY N/A 01/02/2021   Procedure: LAPAROSCOPIC CHOLECYSTECTOMY;  Surgeon:  Rolm Bookbinder, MD;  Location: [facility];  Service: General;  Laterality: N/A;  58  - GEN AND TAP BLOCK LDOW   COLONOSCOPY  09/08   COLPORRHAPHY  05/2009   Dr. Quincy Simmonds   ENDOMETRIAL ABLATION  2006   Dr. Quincy Simmonds   INCONTINENCE SURGERY     sling for urinary incontinence   IR IMAGING GUIDED PORT INSERTION  09/01/2021   KYPHOPLASTY N/A 04/07/2021   Procedure: Thoracic 12 Vertebral body biopsy;  Surgeon: Kristeen Miss, MD;  Location: [facility];  Service: Neurosurgery;  Laterality: N/A;   LAPAROSCOPIC ENDOMETRIOSIS FULGURATION     ablation   LUMBAR LAMINECTOMY/DECOMPRESSION MICRODISCECTOMY N/A 07/31/2021   Procedure: Open Transpedicular biopsy of Thoracic twelve;  Surgeon: Kristeen Miss, MD;  Location: [facility];  Service: Neurosurgery;  Laterality: N/A;   OOPHORECTOMY  2010   w/hyst--Dr. Quincy Simmonds   TUBAL LIGATION  2004   w/ C-section Dr. Quincy Simmonds   TYMPANOPLASTY     right ear   VAGINAL HYSTERECTOMY  05/21/2009   lap assisted /partial    SOCIAL HISTORY: Social History   Socioeconomic History   Marital status: Married    Spouse name: Not  on file   Number of children: 2   Years of education: Not on file   Highest education level: Not on file  Occupational History   Occupation: [employer]  Tobacco Use   Smoking status: Former    Years: 10.00    Types: Cigarettes    Quit date: 01/06/1999    Years since quitting: 22.6   Smokeless tobacco: Never  Vaping Use   Vaping Use: Never used  Substance and Sexual Activity   Alcohol use: No    Alcohol/week: 0.0 standard drinks   Drug use: No   Sexual activity: Yes    Partners: Male    Birth control/protection: Other-see comments, Surgical    Comment: TAH/LSO  Other Topics Concern   Not on file  Social History Narrative   ** Merged History Encounter **       Social Determinants of Health   Financial Resource Strain: Not on file  Food Insecurity: Not on file  Transportation Needs: Not on file  Physical Activity: Not on file  Stress: Not  on file  Social Connections: Not on file  Intimate Partner Violence: Not on file    FAMILY HISTORY: Family History  Problem Relation Age of Onset   Kidney disease Mother        renal failure   Stroke Mother    Diabetes Mother    Hypertension Mother    Heart failure Mother    Colon cancer Mother    Stroke Father    Cancer - Colon Maternal Aunt    Hypertension Sister    Rheum arthritis Sister    Hypertension Brother    Hypertension Sister    Hypertension Sister    Hypertension Sister    Breast cancer Sister    Kidney cancer Sister    Esophageal cancer Neg Hx    Rectal cancer Neg Hx    Stomach cancer Neg Hx     ALLERGIES:  is allergic to buspar [buspirone hcl].  MEDICATIONS:  Current Outpatient Medications  Medication Sig Dispense Refill   acetaminophen (TYLENOL) 500 MG tablet Take 1,000 mg by mouth every 6 (six) hours as needed for moderate pain or headache.     betamethasone valerate ointment (VALISONE) 0.1 % Apply 1 application topically 2 (two) times daily. (Patient taking differently: Apply 1 application topically 2 (two) times daily as needed (rash).) 45 g 1   Biotin 10000 MCG TABS Take 10,000 mcg by mouth daily.     Black Cohosh 40 MG CAPS Take 40 mg by mouth daily.     cetirizine (ZYRTEC) 10 MG tablet Take 10 mg by mouth daily.      Cholecalciferol (VITAMIN D3) 125 MCG (5000 UT) CAPS Take 5,000 Units by mouth daily.     COVID-19 mRNA bivalent vaccine, Moderna, (MODERNA COVID-19 BIVAL BOOSTER) 50 MCG/0.5ML injection Inject into the muscle. 0.5 mL 0   hydrochlorothiazide (HYDRODIURIL) 25 MG tablet Take 1 tablet (25 mg total) by mouth daily. For blood pressure. 90 tablet 3   HYDROcodone-acetaminophen (NORCO) 5-325 MG tablet Take 1-2 tablets by mouth every 6 (six) hours as needed for moderate pain or severe pain. 10 tablet 0   hydrOXYzine (ATARAX/VISTARIL) 10 MG tablet Take 1 or 2 tablets by mouth as needed for anxiety attack. 30 tablet 0   influenza vac split  quadrivalent PF (FLUARIX) 0.5 ML injection Inject into the muscle. 0.5 mL 0   ketorolac (TORADOL) 10 MG tablet Take 1 tablet (10 mg total) by mouth every 6 (six)  hours as needed for moderate pain or severe pain. 20 tablet 0   LORazepam (ATIVAN) 0.5 MG tablet Take 1 tablet (0.5 mg total) by mouth every 12 (twelve) hours as needed for anxiety. 30 tablet 1   Maca 500 MG CAPS Take 500 mg by mouth daily.     Methylcobalamin (B-12) 5000 MCG TBDP Take 5,000 mcg by mouth daily.     Prenatal Vit-Fe Fumarate-FA (PRENATAL MULTIVITAMIN) TABS tablet Take 1 tablet by mouth daily.     Turmeric 500 MG CAPS Take 500 mg by mouth daily.     venlafaxine XR (EFFEXOR-XR) 37.5 MG 24 hr capsule TAKE 1 CAPSULE (37.5 MG TOTAL) BY MOUTH DAILY WITH BREAKFAST. FOR HOT FLASHES AND ANXIETY 90 capsule 1   No current facility-administered medications for this visit.    REVIEW OF SYSTEMS:    .10 Point review of Systems was done is negative except as noted above.  PHYSICAL EXAMINATION: ECOG PERFORMANCE STATUS: 1 - Symptomatic but completely ambulatory  . Vitals:   09/08/21 1145  BP: (!) 153/87  Pulse: 88  Resp: 20  Temp: 97.7 F (36.5 C)    Filed Weights   09/08/21 1145  Weight: 242 lb 4.8 oz (109.9 kg)    .Body mass index is 41.59 kg/m.  GENERAL:alert, in no acute distress and comfortable SKIN: no acute rashes, no significant lesions EYES: conjunctiva are pink and non-injected, sclera anicteric OROPHARYNX: MMM, no exudates, no oropharyngeal erythema or ulceration NECK: supple, no JVD LYMPH:  no palpable lymphadenopathy in the cervical, axillary or inguinal regions LUNGS: clear to auscultation b/l with normal respiratory effort HEART: regular rate & rhythm ABDOMEN:  normoactive bowel sounds , non tender, not distended. Extremity: no pedal edema PSYCH: alert & oriented x 3 with fluent speech NEURO: no focal motor/sensory deficits MUSCULOSKELETAL: Pain in upper right thoracic spine and  mid-back  LABORATORY DATA:  I have reviewed the data as listed  . CBC Latest Ref Rng & Units 08/20/2021 07/28/2021 05/01/2021  WBC 4.0 - 10.5 K/uL 6.7 6.3 6.0  Hemoglobin 12.0 - 15.0 g/dL 13.4 13.6 13.9  Hematocrit 36.0 - 46.0 % 39.5 41.3 41.5  Platelets 150 - 400 K/uL 171 168 159.0   . CMP Latest Ref Rng & Units 08/20/2021 07/28/2021 05/01/2021  Glucose 70 - 99 mg/dL 101(H) 102(H) 100(H)  BUN 6 - 20 mg/dL 16 18 20   Creatinine 0.44 - 1.00 mg/dL 0.70 0.57 0.81  Sodium 135 - 145 mmol/L 139 137 141  Potassium 3.5 - 5.1 mmol/L 3.5 4.4 4.5  Chloride 98 - 111 mmol/L 104 103 104  CO2 22 - 32 mmol/L 27 30 30   Calcium 8.9 - 10.3 mg/dL 9.6 9.3 9.7  Total Protein 6.5 - 8.1 g/dL 7.2 - 7.0  Total Bilirubin 0.3 - 1.2 mg/dL 0.3 - 0.4  Alkaline Phos 38 - 126 U/L 124 - 103  AST 15 - 41 U/L 21 - 22  ALT 0 - 44 U/L 24 - 23   Component     Latest Ref Rng & Units 08/20/2021  IgG (Immunoglobin G), Serum     586 - 1,602 mg/dL 789  IgA     87 - 352 mg/dL 337  IgM (Immunoglobulin M), Srm     26 - 217 mg/dL 51  Total Protein ELP     6.0 - 8.5 g/dL 6.8  Albumin SerPl Elph-Mcnc     2.9 - 4.4 g/dL 3.9  Alpha 1     0.0 - 0.4 g/dL 0.2  Alpha2 Glob SerPl Elph-Mcnc     0.4 - 1.0 g/dL 0.7  B-Globulin SerPl Elph-Mcnc     0.7 - 1.3 g/dL 1.3  Gamma Glob SerPl Elph-Mcnc     0.4 - 1.8 g/dL 0.8  M Protein SerPl Elph-Mcnc     Not Observed g/dL Not Observed  Globulin, Total     2.2 - 3.9 g/dL 2.9  Albumin/Glob SerPl     0.7 - 1.7 1.4  IFE 1      Comment  Please Note (HCV):      Comment  HIV Screen 4th Generation wRfx     Non Reactive Non Reactive  Hepatitis B Surface Ag     NON REACTIVE NON REACTIVE  Hep B Core Total Ab     NON REACTIVE NON REACTIVE  HCV Ab     NON REACTIVE NON REACTIVE  LDH     98 - 192 U/L 228 (H)    SURGICAL PATHOLOGY  CASE: [id number]  PATIENT: Lien Altier  Surgical Pathology Report      Clinical History: lesion of thoracic vertebra (cm)      FINAL  MICROSCOPIC DIAGNOSIS:   A-C. BONE, T12 VERTEBRAL BODY AND MARROW, BIOPSY:  -  Non-Hodgkin B-cell lymphoma, high grade  -  See comment   COMMENT:   A-C.  The biopsies show normal hematopoeisis admixed with crush artifact  with associated marrow fibrosis and an atypical cellular infiltrate  composed of medium to large cells with irregular nuclear contours, ample  cytoplasm, variably prominent nucleoli, increased mitoses and apoptosis.  An initial panel of immunohistochemistry (cytokeratin AE1/3, cytokeratin  7, cytokeratin 20, CD20 and CD3) confirmed the morphologic impression  that the atypical cells were lymphoid in nature.  The atypical  lymphocytes are positive for Pax-5, CD20, CD10, bcl-6 but negative for  CD30, bcl-2, mum-1 and EBV by in-situ hybridization. CD3 and CD5  highlight background T-cells.  The proliferative rate by ki-67 is  increased (50-60%).  Overall, the findings are consistent with a  non-Hodgkin B-cell lymphoma, high grade with a germinal center  phenotype.   FISH studies are pending and will be reported in an  addendum.  These findings were discussed with Dr. Ellene Route on August 06, 2021.   RADIOGRAPHIC STUDIES: I have personally reviewed the radiological images as listed and agreed with the findings in the report.  ECHO 09/08/2021 1.Left ventricular ejection fraction, by estimation, is 55 to 60%. The left ventricle has normal function. The left ventricle has no regional wall motion abnormalities. There ismild left ventricular hypertrophy. Left ventricular diastolic parameters were normal.The average left ventricular global longitudinal strain is -20.4 %. The global longitudinal strain is normal. 2. Right ventricular systolic function is normal. The right ventricular size is normal. 3. Left atrial size was mildly dilated. The mitral valve is normal in structure. Trivial mitral valve regurgitation. No evidence of mitral stenosis. Moderate mitral annular  calcification. 4.The aortic valve is normal in structure. Aortic valve regurgitation is not visualized. No aortic stenosis is present. 5.The inferior vena cava is normal in size with greater than 50% respiratory variability, suggesting right atrial pressure of 3 mmHg.  NM PET Image Initial (PI) Skull Base To Thigh  Result Date: 09/03/2021 CLINICAL DATA:  Initial treatment strategy for B-cell lymphoma. EXAM: NUCLEAR MEDICINE PET SKULL BASE TO THIGH TECHNIQUE: 11.9 mCi F-18 FDG was injected intravenously. Full-ring PET imaging was performed from the skull base to thigh after the radiotracer. CT data was obtained and used for attenuation  correction and anatomic localization. Fasting blood glucose: 110 mg/dl COMPARISON:  MR thoracic spine 07/04/2021, bone scan 12/23/2020, CT abdomen pelvis 11/20/2020 and CT chest 02/25/2017. FINDINGS: Mediastinal blood pool activity: SUV max 2.7 Liver activity: SUV max 4.8 NECK: Mild tonsillar and submandibular gland uptake, likely physiologic. No abnormal hypermetabolism. Incidental CT findings: None. CHEST: Small axillary lymph nodes do not show hypermetabolism above blood pool. No hypermetabolic mediastinal or hilar lymph nodes. No hypermetabolic pulmonary nodules. Incidental CT findings: Atherosclerotic calcification of the aorta. Heart size normal. No pericardial or pleural effusion. ABDOMEN/PELVIS: No abnormal hypermetabolism in the liver, adrenal glands, spleen or pancreas. Subcentimeter short axis abdominal retroperitoneal and mesenteric lymph nodes show metabolism up to SUV max 3.4 within a 5 mm lymph node at the root of the small bowel mesentery (4/130). 8 mm periportal lymph node (4/115), SUV max 3.0. Incidental CT findings: Liver parenchyma is mildly heterogeneous. Low-attenuation lesions in the liver measure up to 5.6 cm in the left hepatic lobe and are likely cysts. Cholecystectomy. Adrenal glands are unremarkable. Tiny stones in the kidneys. Spleen, pancreas  stomach and bowel are unremarkable. Mild haziness and subcentimeter nodularity in the small bowel mesentery. SKELETON: Mild hypermetabolism associated with the posterior aspect of the T7 vertebral body, SUV max 3.4, corresponding to a known lesion on 07/04/2021. Abnormal hypermetabolism associated with linear lucency in the right aspect of the T12 vertebral body, SUV max 8.0. Focal hypermetabolism associated with the scapular acromion on the right, SUV max 4.7, without a definite CT correlate. Incidental CT findings: Degenerative changes in the spine. IMPRESSION: 1. Scattered mild hypermetabolism within subcentimeter periportal, retroperitoneal and mesenteric lymph nodes, compatible with the given history of large B-cell lymphoma. 2. Hypermetabolism within known T7 and T12 lesions, better seen on MR thoracic spine 07/04/2021. 3. Focal hypermetabolism in the acromion of the right scapula, without a definite CT correlate. Recommend attention on follow-up. 4. Hepatic steatosis. 5. Bilateral renal stones. 6.  Aortic atherosclerosis (ICD10-I70.0). Electronically Signed   By: Lorin Picket M.D.   On: 09/03/2021 09:52   IR IMAGING GUIDED PORT INSERTION  Result Date: 09/01/2021 INDICATION: 55 year old female with history of B-cell lymphoma requiring central venous access for chemotherapy administration. EXAM: IMPLANTED PORT A CATH PLACEMENT WITH ULTRASOUND AND FLUOROSCOPIC GUIDANCE COMPARISON:  None. MEDICATIONS: None. ANESTHESIA/SEDATION: Moderate (conscious) sedation was employed during this procedure. A total of Versed 2 mg and Fentanyl 100 mcg was administered intravenously. Moderate Sedation Time: 16 minutes. The patient's level of consciousness and vital signs were monitored continuously by radiology nursing throughout the procedure under my direct supervision. CONTRAST:  None FLUOROSCOPY TIME:  0 minutes, 18 seconds (2 mGy) COMPLICATIONS: None immediate. PROCEDURE: The procedure, risks, benefits, and  alternatives were explained to the patient. Questions regarding the procedure were encouraged and answered. The patient understands and consents to the procedure. The right neck and chest were prepped with chlorhexidine in a sterile fashion, and a sterile drape was applied covering the operative field. Maximum barrier sterile technique with sterile gowns and gloves were used for the procedure. A timeout was performed prior to the initiation of the procedure. Ultrasound was used to examine the jugular vein which was compressible and free of internal echoes. A skin marker was used to demarcate the planned venotomy and port pocket incision sites. Local anesthesia was provided to these sites and the subcutaneous tunnel track with 1% lidocaine with 1:100,000 epinephrine. A small incision was created at the jugular access site and blunt dissection was performed of the subcutaneous tissues.  Under ultrasound guidance, the jugular vein was accessed with a 21 ga micropuncture needle and an 0.018" wire was inserted to the superior vena cava. Real-time ultrasound guidance was utilized for vascular access including the acquisition of a permanent ultrasound image documenting patency of the accessed vessel. A 5 Fr micopuncture set was then used, through which a 0.035" Rosen wire was passed under fluoroscopic guidance into the inferior vena cava. An 8 Fr dilator was then placed over the wire. A subcutaneous port pocket was then created along the upper chest wall utilizing a combination of sharp and blunt dissection. The pocket was irrigated with sterile saline, packed with gauze, and observed for hemorrhage. A single lumen "ISP" sized power injectable port was chosen for placement. The 8 Fr catheter was tunneled from the port pocket site to the venotomy incision. The port was placed in the pocket. The external catheter was trimmed to appropriate length. The dilator was exchanged for an 8 Fr peel-away sheath under fluoroscopic  guidance. The catheter was then placed through the sheath and the sheath was removed. Final catheter positioning was confirmed and documented with a fluoroscopic spot radiograph. The port was accessed with a Huber needle, aspirated, and flushed with heparinized saline. The deep dermal layer of the port pocket incision was closed with interrupted 3-0 Vicryl suture. The skin was opposed with a running subcuticular 4-0 Monocryl suture. Dermabond was then placed over the port pocket and neck incisions. The patient tolerated the procedure well without immediate post procedural complication. FINDINGS: After catheter placement, the tip lies within the superior cavoatrial junction. The catheter aspirates and flushes normally and is ready for immediate use. IMPRESSION: Successful placement of a power injectable Port-A-Cath via the right internal jugular vein. The catheter is ready for immediate use. Ruthann Cancer, MD [facility] Electronically Signed   By: Ruthann Cancer M.D.   On: 09/01/2021 10:13    ASSESSMENT & PLAN:   55 year old female with  1) Newly diagnosed stage IVA high-grade large B cell non-Hodgkin's lymphoma.  Germinal center phenotype.  C-Myc not rearranged.  Ki-67 50 to 60% No overt constitutional symptoms PET/CT with involvement of thoracic spine and acromion process on the right.  Abdominal lymphadenopathy. PLAN Labs done on 08/20/2021 were reviewed in detail CBC within normal limits, CMP unremarkable, LDH slightly elevated at 228. Hepatitis testing unremarkable HIV nonreactive Myeloma panel shows no M spike protein spike.  Echo cardiogram shows normal ejection fraction  PET CT scan done on 09/02/2021 shows Scattered mild hypermetabolism within subcentimeter periportal, retroperitoneal and mesenteric lymph nodes, compatible with the given history of large B-cell lymphoma. 2. Hypermetabolism within known T7 and T12 lesions, better seen  on MR thoracic spine 07/04/2021. 3. Focal hypermetabolism in the acromion of the right scapula, without a definite CT correlate. Recommend attention on follow-up.  She reports that the area of port insertion has been sore. Denies fevers and the site is not red and hot to touch. Not sure if it is because of the way she is lying down.   She is scheduled to have chemotherapy counseling and is scheduled to start her chemotherapy R-CHOP with G-CSF support on 09/18/2021. -We will plan to do 6 cycles of R-CHOP if tolerated followed by possible consideration of ISRT.  Follow-up Please reschedule MD visit from 12/8 out by 7 to 8 days for toxicity check after cycle 1 of R-CHOP  All of the patients questions were answered with apparent satisfaction. The patient knows to call  the clinic with any problems, questions or concerns.  . The total time spent in the appointment was 30 minutes and more than 50% was on counseling and direct patient cares, discussion of lab results, echo results, PET CT scan and treatment plan in detail.    I,Zite Okoli,acting as a Education administrator for Sullivan Lone, MD.,have documented all relevant documentation on the behalf of Sullivan Lone, MD,as directed by  Sullivan Lone, MD while in the presence of Sullivan Lone, MD.   .I have reviewed the above documentation for accuracy and completeness, and I agree with the above.     Sullivan Lone MD [place] AAHIVMS [facility] Hematology/Oncology Physician [facility]

## 2021-09-10 ENCOUNTER — Inpatient Hospital Stay: Payer: 59

## 2021-09-10 ENCOUNTER — Telehealth: Payer: Self-pay | Admitting: Hematology

## 2021-09-10 ENCOUNTER — Encounter: Payer: Self-pay | Admitting: Hematology

## 2021-09-10 ENCOUNTER — Other Ambulatory Visit: Payer: Self-pay

## 2021-09-10 MED ORDER — ONDANSETRON HCL 8 MG PO TABS: 8.0000 mg | ORAL_TABLET | Freq: Two times a day (BID) | ORAL | 1 refills | Status: DC | PRN

## 2021-09-10 MED ORDER — LIDOCAINE-PRILOCAINE 2.5-2.5 % EX CREA: TOPICAL_CREAM | CUTANEOUS | 3 refills | Status: DC

## 2021-09-10 MED ORDER — PREDNISONE 20 MG PO TABS: 60.0000 mg | ORAL_TABLET | Freq: Every day | ORAL | 5 refills | Status: DC

## 2021-09-10 MED ORDER — PROCHLORPERAZINE MALEATE 10 MG PO TABS: 10.0000 mg | ORAL_TABLET | Freq: Four times a day (QID) | ORAL | 6 refills | Status: DC | PRN

## 2021-09-10 NOTE — Telephone Encounter (Signed)
Scheduled follow-up appointments per 11/28 los. Patient is aware.

## 2021-09-11 NOTE — Progress Notes (Signed)
Pharmacist Chemotherapy Monitoring - Initial Assessment    Anticipated start date: 09/18/21   The following has been reviewed per standard work regarding the patient's treatment regimen: The patient's diagnosis, treatment plan and drug doses, and organ/hematologic function Lab orders and baseline tests specific to treatment regimen  The treatment plan start date, drug sequencing, and pre-medications Prior authorization status  Patient's documented medication list, including drug-drug interaction screen and prescriptions for anti-emetics and supportive care specific to the treatment regimen The drug concentrations, fluid compatibility, administration routes, and timing of the medications to be used The patient's access for treatment and lifetime cumulative dose history, if applicable  The patient's medication allergies and previous infusion related reactions, if applicable   Changes made to treatment plan:  N/A  Follow up needed:  Pending authorization for treatment    Virginia Oneal, Virginia Oneal, 09/11/2021  9:57 AM

## 2021-09-14 ENCOUNTER — Encounter: Payer: Self-pay | Admitting: Hematology

## 2021-09-17 ENCOUNTER — Other Ambulatory Visit: Payer: Self-pay

## 2021-09-17 ENCOUNTER — Encounter: Payer: Self-pay | Admitting: Hematology

## 2021-09-17 DIAGNOSIS — C8330 Diffuse large B-cell lymphoma, unspecified site: Secondary | ICD-10-CM

## 2021-09-17 MED FILL — Dexamethasone Sodium Phosphate Inj 100 MG/10ML: INTRAMUSCULAR | Qty: 1 | Status: AC

## 2021-09-17 MED FILL — Fosaprepitant Dimeglumine For IV Infusion 150 MG (Base Eq): INTRAVENOUS | Qty: 5 | Status: AC

## 2021-09-17 NOTE — Progress Notes (Signed)
Called pt to introduce myself as her Arboriculturist, discuss copay assistance and the [employer].  Pt believes she's very close to meeting her deductible so her ins should pay for her treatment at 100% so copay assistance shouldn't be needed.  I also informed her of the [employer] and went over what it covers but pt declined the grant at this time.  I requested for the registration staff give her my card for any questions or concerns she may have in the future.

## 2021-09-18 ENCOUNTER — Other Ambulatory Visit: Payer: Self-pay

## 2021-09-18 ENCOUNTER — Inpatient Hospital Stay: Payer: 59

## 2021-09-18 ENCOUNTER — Inpatient Hospital Stay: Payer: 59 | Attending: Hematology

## 2021-09-18 ENCOUNTER — Inpatient Hospital Stay: Payer: 59 | Admitting: Hematology

## 2021-09-18 VITALS — BP 136/75 | HR 92 | Temp 98.3°F | Resp 18 | Wt 243.0 lb

## 2021-09-18 DIAGNOSIS — C8330 Diffuse large B-cell lymphoma, unspecified site: Secondary | ICD-10-CM

## 2021-09-18 DIAGNOSIS — Z7189 Other specified counseling: Secondary | ICD-10-CM

## 2021-09-18 DIAGNOSIS — Z79899 Other long term (current) drug therapy: Secondary | ICD-10-CM | POA: Insufficient documentation

## 2021-09-18 DIAGNOSIS — C8512 Unspecified B-cell lymphoma, intrathoracic lymph nodes: Secondary | ICD-10-CM | POA: Diagnosis not present

## 2021-09-18 DIAGNOSIS — Z5189 Encounter for other specified aftercare: Secondary | ICD-10-CM | POA: Diagnosis not present

## 2021-09-18 DIAGNOSIS — Z5111 Encounter for antineoplastic chemotherapy: Secondary | ICD-10-CM | POA: Diagnosis not present

## 2021-09-18 DIAGNOSIS — Z800 Family history of malignant neoplasm of digestive organs: Secondary | ICD-10-CM | POA: Diagnosis not present

## 2021-09-18 DIAGNOSIS — Z87442 Personal history of urinary calculi: Secondary | ICD-10-CM | POA: Insufficient documentation

## 2021-09-18 DIAGNOSIS — I10 Essential (primary) hypertension: Secondary | ICD-10-CM | POA: Insufficient documentation

## 2021-09-18 DIAGNOSIS — Z95828 Presence of other vascular implants and grafts: Secondary | ICD-10-CM | POA: Insufficient documentation

## 2021-09-18 LAB — CMP (CANCER CENTER ONLY)
ALT: 25 U/L (ref 0–44)
AST: 19 U/L (ref 15–41)
Albumin: 3.9 g/dL (ref 3.5–5.0)
Alkaline Phosphatase: 113 U/L (ref 38–126)
Anion gap: 10 (ref 5–15)
BUN: 20 mg/dL (ref 6–20)
CO2: 26 mmol/L (ref 22–32)
Calcium: 9.1 mg/dL (ref 8.9–10.3)
Chloride: 106 mmol/L (ref 98–111)
Creatinine: 0.75 mg/dL (ref 0.44–1.00)
GFR, Estimated: >60 mL/min (ref >60)
Glucose, Bld: 133 mg/dL — ABNORMAL HIGH (ref 70–99)
Potassium: 3.7 mmol/L (ref 3.5–5.1)
Sodium: 142 mmol/L (ref 135–145)
Total Bilirubin: 0.3 mg/dL (ref 0.3–1.2)
Total Protein: 6.9 g/dL (ref 6.5–8.1)

## 2021-09-18 LAB — CBC WITH DIFFERENTIAL (CANCER CENTER ONLY)
Abs Immature Granulocytes: 0.02 10*3/uL (ref 0.00–0.07)
Basophils Absolute: 0 10*3/uL (ref 0.0–0.1)
Basophils Relative: 0 %
Eosinophils Absolute: 0.1 10*3/uL (ref 0.0–0.5)
Eosinophils Relative: 2 %
HCT: 39.9 % (ref 36.0–46.0)
Hemoglobin: 13.4 g/dL (ref 12.0–15.0)
Immature Granulocytes: 0 %
Lymphocytes Relative: 28 %
Lymphs Abs: 1.7 10*3/uL (ref 0.7–4.0)
MCH: 30.4 pg (ref 26.0–34.0)
MCHC: 33.6 g/dL (ref 30.0–36.0)
MCV: 90.5 fL (ref 80.0–100.0)
Monocytes Absolute: 0.3 10*3/uL (ref 0.1–1.0)
Monocytes Relative: 5 %
Neutro Abs: 3.8 10*3/uL (ref 1.7–7.7)
Neutrophils Relative %: 65 %
Platelet Count: 154 10*3/uL (ref 150–400)
RBC: 4.41 MIL/uL (ref 3.87–5.11)
RDW: 12.5 % (ref 11.5–15.5)
WBC Count: 5.9 10*3/uL (ref 4.0–10.5)
nRBC: 0 % (ref 0.0–0.2)

## 2021-09-18 LAB — LACTATE DEHYDROGENASE: LDH: 229 U/L — ABNORMAL HIGH (ref 98–192)

## 2021-09-18 MED ORDER — SODIUM CHLORIDE 0.9% FLUSH
10.0000 mL | INTRAVENOUS | Status: DC | PRN
  Administered 2021-09-18: 10 mL

## 2021-09-18 MED ORDER — ACETAMINOPHEN 325 MG PO TABS
650.0000 mg | ORAL_TABLET | Freq: Once | ORAL | Status: AC
  Administered 2021-09-18: 650 mg via ORAL
  Filled 2021-09-18: qty 2

## 2021-09-18 MED ORDER — SODIUM CHLORIDE 0.9 % IV SOLN: Freq: Once | INTRAVENOUS | Status: AC

## 2021-09-18 MED ORDER — DIPHENHYDRAMINE HCL 25 MG PO CAPS
50.0000 mg | ORAL_CAPSULE | Freq: Once | ORAL | Status: AC
  Administered 2021-09-18: 50 mg via ORAL
  Filled 2021-09-18: qty 2

## 2021-09-18 MED ORDER — DOXORUBICIN HCL CHEMO IV INJECTION 2 MG/ML
50.0000 mg/m2 | Freq: Once | INTRAVENOUS | Status: AC
  Administered 2021-09-18: 112 mg via INTRAVENOUS
  Filled 2021-09-18: qty 56

## 2021-09-18 MED ORDER — PALONOSETRON HCL INJECTION 0.25 MG/5ML
0.2500 mg | Freq: Once | INTRAVENOUS | Status: AC
  Administered 2021-09-18: 0.25 mg via INTRAVENOUS
  Filled 2021-09-18: qty 5

## 2021-09-18 MED ORDER — SODIUM CHLORIDE 0.9% FLUSH
10.0000 mL | Freq: Once | INTRAVENOUS | Status: AC
  Administered 2021-09-18: 10 mL

## 2021-09-18 MED ORDER — SODIUM CHLORIDE 0.9 % IV SOLN
375.0000 mg/m2 | Freq: Once | INTRAVENOUS | Status: AC
  Administered 2021-09-18: 800 mg via INTRAVENOUS
  Filled 2021-09-18: qty 30

## 2021-09-18 MED ORDER — SODIUM CHLORIDE 0.9 % IV SOLN
150.0000 mg | Freq: Once | INTRAVENOUS | Status: AC
  Administered 2021-09-18: 150 mg via INTRAVENOUS
  Filled 2021-09-18: qty 150

## 2021-09-18 MED ORDER — HEPARIN SOD (PORK) LOCK FLUSH 100 UNIT/ML IV SOLN
500.0000 [IU] | Freq: Once | INTRAVENOUS | Status: AC | PRN
  Administered 2021-09-18: 500 [IU]

## 2021-09-18 MED ORDER — FAMOTIDINE 20 MG IN NS 100 ML IVPB
20.0000 mg | Freq: Once | INTRAVENOUS | Status: AC
  Administered 2021-09-18: 20 mg via INTRAVENOUS
  Filled 2021-09-18: qty 100

## 2021-09-18 MED ORDER — SODIUM CHLORIDE 0.9 % IV SOLN
10.0000 mg | Freq: Once | INTRAVENOUS | Status: AC
  Administered 2021-09-18: 10 mg via INTRAVENOUS
  Filled 2021-09-18: qty 10

## 2021-09-18 MED ORDER — VINCRISTINE SULFATE CHEMO INJECTION 1 MG/ML
2.0000 mg | Freq: Once | INTRAVENOUS | Status: AC
  Administered 2021-09-18: 2 mg via INTRAVENOUS
  Filled 2021-09-18: qty 2

## 2021-09-18 MED ORDER — SODIUM CHLORIDE 0.9 % IV SOLN
750.0000 mg/m2 | Freq: Once | INTRAVENOUS | Status: AC
  Administered 2021-09-18: 1660 mg via INTRAVENOUS
  Filled 2021-09-18: qty 83

## 2021-09-18 NOTE — Patient Instructions (Addendum)
[facility]  Discharge Instructions: Thank you for choosing [facility] to provide your oncology and hematology care.   If you have a lab appointment with the Center, please go directly to the [facility] and check in at the registration area.   Wear comfortable clothing and clothing appropriate for easy access to any Portacath or PICC line.   We strive to give you quality time with your provider. You may need to reschedule your appointment if you arrive late (15 or more minutes).  Arriving late affects you and other patients whose appointments are after yours.  Also, if you miss three or more appointments without notifying the office, you may be dismissed from the clinic at the provider's discretion.      For prescription refill requests, have your pharmacy contact our office and allow 72 hours for refills to be completed.    Today you received the following chemotherapy and/or immunotherapy agents: Adriamycin, Oncovin, Cytoxan, & Rituxan       To help prevent nausea and vomiting after your treatment, we encourage you to take your nausea medication as directed.  BELOW ARE SYMPTOMS THAT SHOULD BE REPORTED IMMEDIATELY: *FEVER GREATER THAN 100.4 F (38 C) OR HIGHER *CHILLS OR SWEATING *NAUSEA AND VOMITING THAT IS NOT CONTROLLED WITH YOUR NAUSEA MEDICATION *UNUSUAL SHORTNESS OF BREATH *UNUSUAL BRUISING OR BLEEDING *URINARY PROBLEMS (pain or burning when urinating, or frequent urination) *BOWEL PROBLEMS (unusual diarrhea, constipation, pain near the anus) TENDERNESS IN MOUTH AND THROAT WITH OR WITHOUT PRESENCE OF ULCERS (sore throat, sores in mouth, or a toothache) UNUSUAL RASH, SWELLING OR PAIN  UNUSUAL VAGINAL DISCHARGE OR ITCHING   Items with * indicate a potential emergency and should be followed up as soon as possible or go to the Emergency Department if any problems should occur.  Please show the CHEMOTHERAPY ALERT CARD or  IMMUNOTHERAPY ALERT CARD at check-in to the Emergency Department and triage nurse.  Should you have questions after your visit or need to cancel or reschedule your appointment, please contact [facility]  Dept: [phone]  and follow the prompts.  Office hours are 8:00 a.m. to 4:30 p.m. Monday - Friday. Please note that voicemails left after 4:00 p.m. may not be returned until the following business day.  We are closed weekends and major holidays. You have access to a nurse at all times for urgent questions. Please call the main number to the clinic Dept: [phone] and follow the prompts.   For any non-urgent questions, you may also contact your provider using MyChart. We now offer e-Visits for anyone 1 and older to request care online for non-urgent symptoms. For details visit [email or web address].   Also download the MyChart app! Go to the app store, search "MyChart", open the app, select [facility], and log in with your MyChart username and password.  Due to Covid, a mask is required upon entering the hospital/clinic. If you do not have a mask, one will be given to you upon arrival. For doctor visits, patients may have 1 support person aged 65 or older with them. For treatment visits, patients cannot have anyone with them due to current Covid guidelines and our immunocompromised population.   Doxorubicin injection What is this medication? DOXORUBICIN (dox oh ROO bi sin) is a chemotherapy drug. It is used to treat many kinds of cancer like leukemia, lymphoma, neuroblastoma, sarcoma, and Wilms' tumor. It is also used to treat bladder cancer, breast cancer, lung  cancer, ovarian cancer, stomach cancer, and thyroid cancer. This medicine may be used for other purposes; ask your health care provider or pharmacist if you have questions. COMMON BRAND NAME(S): Adriamycin, Adriamycin PFS, Adriamycin RDF, Rubex What should I tell my care team before I take this  medication? They need to know if you have any of these conditions: heart disease history of low blood counts caused by a medicine liver disease recent or ongoing radiation therapy an unusual or allergic reaction to doxorubicin, other chemotherapy agents, other medicines, foods, dyes, or preservatives pregnant or trying to get pregnant breast-feeding How should I use this medication? This drug is given as an infusion into a vein. It is administered in a hospital or clinic by a specially trained health care professional. If you have pain, swelling, burning or any unusual feeling around the site of your injection, tell your health care professional right away. Talk to your pediatrician regarding the use of this medicine in children. Special care may be needed. Overdosage: If you think you have taken too much of this medicine contact a poison control center or emergency room at once. NOTE: This medicine is only for you. Do not share this medicine with others. What if I miss a dose? It is important not to miss your dose. Call your doctor or health care professional if you are unable to keep an appointment. What may interact with this medication? This medicine may interact with the following medications: 6-mercaptopurine paclitaxel phenytoin St. John's Wort trastuzumab verapamil This list may not describe all possible interactions. Give your health care provider a list of all the medicines, herbs, non-prescription drugs, or dietary supplements you use. Also tell them if you smoke, drink alcohol, or use illegal drugs. Some items may interact with your medicine. What should I watch for while using this medication? This drug may make you feel generally unwell. This is not uncommon, as chemotherapy can affect healthy cells as well as cancer cells. Report any side effects. Continue your course of treatment even though you feel ill unless your doctor tells you to stop. There is a maximum amount of  this medicine you should receive throughout your life. The amount depends on the medical condition being treated and your overall health. Your doctor will watch how much of this medicine you receive in your lifetime. Tell your doctor if you have taken this medicine before. You may need blood work done while you are taking this medicine. Your urine may turn red for a few days after your dose. This is not blood. If your urine is dark or brown, call your doctor. In some cases, you may be given additional medicines to help with side effects. Follow all directions for their use. Call your doctor or health care professional for advice if you get a fever, chills or sore throat, or other symptoms of a cold or flu. Do not treat yourself. This drug decreases your body's ability to fight infections. Try to avoid being around people who are sick. This medicine may increase your risk to bruise or bleed. Call your doctor or health care professional if you notice any unusual bleeding. Talk to your doctor about your risk of cancer. You may be more at risk for certain types of cancers if you take this medicine. Do not become pregnant while taking this medicine or for 6 months after stopping it. Women should inform their doctor if they wish to become pregnant or think they might be pregnant. Men should not father  a child while taking this medicine and for 6 months after stopping it. There is a potential for serious side effects to an unborn child. Talk to your health care professional or pharmacist for more information. Do not breast-feed an infant while taking this medicine. This medicine has caused ovarian failure in some women and reduced sperm counts in some men This medicine may interfere with the ability to have a child. Talk with your doctor or health care professional if you are concerned about your fertility. This medicine may cause a decrease in Co-Enzyme Q-10. You should make sure that you get enough Co-Enzyme  Q-10 while you are taking this medicine. Discuss the foods you eat and the vitamins you take with your health care professional. What side effects may I notice from receiving this medication? Side effects that you should report to your doctor or health care professional as soon as possible: allergic reactions like skin rash, itching or hives, swelling of the face, lips, or tongue breathing problems chest pain fast or irregular heartbeat low blood counts - this medicine may decrease the number of white blood cells, red blood cells and platelets. You may be at increased risk for infections and bleeding. pain, redness, or irritation at site where injected signs of infection - fever or chills, cough, sore throat, pain or difficulty passing urine signs of decreased platelets or bleeding - bruising, pinpoint red spots on the skin, black, tarry stools, blood in the urine swelling of the ankles, feet, hands tiredness weakness Side effects that usually do not require medical attention (report to your doctor or health care professional if they continue or are bothersome): diarrhea hair loss mouth sores nail discoloration or damage nausea red colored urine vomiting This list may not describe all possible side effects. Call your doctor for medical advice about side effects. You may report side effects to FDA at 1-800-FDA-1088. Where should I keep my medication? This drug is given in a hospital or clinic and will not be stored at home. NOTE: This sheet is a summary. It may not cover all possible information. If you have questions about this medicine, talk to your doctor, pharmacist, or health care provider.  2022 Elsevier/Gold Standard (2017-06-03 00:00:00)  Vincristine injection What is this medication? VINCRISTINE (vin KRIS teen) is a chemotherapy drug. It slows the growth of cancer cells. This medicine is used to treat many types of cancer like Hodgkin's disease, leukemia, non-Hodgkin's  lymphoma, neuroblastoma (brain cancer), rhabdomyosarcoma, and Wilms' tumor. This medicine may be used for other purposes; ask your health care provider or pharmacist if you have questions. COMMON BRAND NAME(S): Oncovin, Vincasar PFS What should I tell my care team before I take this medication? They need to know if you have any of these conditions: blood disorders gout infection (especially chickenpox, cold sores, or herpes) kidney disease liver disease lung disease nervous system disease like Charcot-Marie-Tooth (CMT) recent or ongoing radiation therapy an unusual or allergic reaction to vincristine, other chemotherapy agents, other medicines, foods, dyes, or preservatives pregnant or trying to get pregnant breast-feeding How should I use this medication? This drug is given as an infusion into a vein. It is administered in a hospital or clinic by a specially trained health care professional. If you have pain, swelling, burning, or any unusual feeling around the site of your injection, tell your health care professional right away. Talk to your pediatrician regarding the use of this medicine in children. While this drug may be prescribed for selected conditions, precautions  do apply. Overdosage: If you think you have taken too much of this medicine contact a poison control center or emergency room at once. NOTE: This medicine is only for you. Do not share this medicine with others. What if I miss a dose? It is important not to miss your dose. Call your doctor or health care professional if you are unable to keep an appointment. What may interact with this medication? certain medicines for fungal infections like itraconazole, ketoconazole, posaconazole, voriconazole certain medicines for seizures like phenytoin This list may not describe all possible interactions. Give your health care provider a list of all the medicines, herbs, non-prescription drugs, or dietary supplements you use. Also  tell them if you smoke, drink alcohol, or use illegal drugs. Some items may interact with your medicine. What should I watch for while using this medication? This drug may make you feel generally unwell. This is not uncommon, as chemotherapy can affect healthy cells as well as cancer cells. Report any side effects. Continue your course of treatment even though you feel ill unless your doctor tells you to stop. You may need blood work done while you are taking this medicine. This medicine will cause constipation. Try to have a bowel movement at least every 2 to 3 days. If you do not have a bowel movement for 3 days, call your doctor or health care professional. In some cases, you may be given additional medicines to help with side effects. Follow all directions for their use. Do not become pregnant while taking this medicine. Women should inform their doctor if they wish to become pregnant or think they might be pregnant. There is a potential for serious side effects to an unborn child. Talk to your health care professional or pharmacist for more information. Do not breast-feed an infant while taking this medicine. This medicine may make it more difficult to get pregnant or to father a child. Talk to your healthcare professional if you are concerned about your fertility. What side effects may I notice from receiving this medication? Side effects that you should report to your doctor or health care professional as soon as possible: allergic reactions like skin rash, itching or hives, swelling of the face, lips, or tongue breathing problems confusion or changes in emotions or moods constipation cough mouth sores muscle weakness nausea and vomiting pain, swelling, redness or irritation at the injection site pain, tingling, numbness in the hands or feet problems with balance, talking, walking seizures stomach pain trouble passing urine or change in the amount of urine Side effects that usually do  not require medical attention (report to your doctor or health care professional if they continue or are bothersome): diarrhea hair loss jaw pain loss of appetite This list may not describe all possible side effects. Call your doctor for medical advice about side effects. You may report side effects to FDA at 1-800-FDA-1088. Where should I keep my medication? This drug is given in a hospital or clinic and will not be stored at home. NOTE: This sheet is a summary. It may not cover all possible information. If you have questions about this medicine, talk to your doctor, pharmacist, or health care provider.  2022 Elsevier/Gold Standard (2021-06-17 00:00:00)  Cyclophosphamide Injection What is this medication? CYCLOPHOSPHAMIDE (sye kloe FOSS fa mide) is a chemotherapy drug. It slows the growth of cancer cells. This medicine is used to treat many types of cancer like lymphoma, myeloma, leukemia, breast cancer, and ovarian cancer, to name a few. This  medicine may be used for other purposes; ask your health care provider or pharmacist if you have questions. COMMON BRAND NAME(S): Cytoxan, Neosar What should I tell my care team before I take this medication? They need to know if you have any of these conditions: heart disease history of irregular heartbeat infection kidney disease liver disease low blood counts, like white cells, platelets, or red blood cells on hemodialysis recent or ongoing radiation therapy scarring or thickening of the lungs trouble passing urine an unusual or allergic reaction to cyclophosphamide, other medicines, foods, dyes, or preservatives pregnant or trying to get pregnant breast-feeding How should I use this medication? This drug is usually given as an injection into a vein or muscle or by infusion into a vein. It is administered in a hospital or clinic by a specially trained health care professional. Talk to your pediatrician regarding the use of this medicine in  children. Special care may be needed. Overdosage: If you think you have taken too much of this medicine contact a poison control center or emergency room at once. NOTE: This medicine is only for you. Do not share this medicine with others. What if I miss a dose? It is important not to miss your dose. Call your doctor or health care professional if you are unable to keep an appointment. What may interact with this medication? amphotericin B azathioprine certain antivirals for HIV or hepatitis certain medicines for blood pressure, heart disease, irregular heart beat certain medicines that treat or prevent blood clots like warfarin certain other medicines for cancer cyclosporine etanercept indomethacin medicines that relax muscles for surgery medicines to increase blood counts metronidazole This list may not describe all possible interactions. Give your health care provider a list of all the medicines, herbs, non-prescription drugs, or dietary supplements you use. Also tell them if you smoke, drink alcohol, or use illegal drugs. Some items may interact with your medicine. What should I watch for while using this medication? Your condition will be monitored carefully while you are receiving this medicine. You may need blood work done while you are taking this medicine. Drink water or other fluids as directed. Urinate often, even at night. Some products may contain alcohol. Ask your health care professional if this medicine contains alcohol. Be sure to tell all health care professionals you are taking this medicine. Certain medicines, like metronidazole and disulfiram, can cause an unpleasant reaction when taken with alcohol. The reaction includes flushing, headache, nausea, vomiting, sweating, and increased thirst. The reaction can last from 30 minutes to several hours. Do not become pregnant while taking this medicine or for 1 year after stopping it. Women should inform their health care  professional if they wish to become pregnant or think they might be pregnant. Men should not father a child while taking this medicine and for 4 months after stopping it. There is potential for serious side effects to an unborn child. Talk to your health care professional for more information. Do not breast-feed an infant while taking this medicine or for 1 week after stopping it. This medicine has caused ovarian failure in some women. This medicine may make it more difficult to get pregnant. Talk to your health care professional if you are concerned about your fertility. This medicine has caused decreased sperm counts in some men. This may make it more difficult to father a child. Talk to your health care professional if you are concerned about your fertility. Call your health care professional for advice if you get  a fever, chills, or sore throat, or other symptoms of a cold or flu. Do not treat yourself. This medicine decreases your body's ability to fight infections. Try to avoid being around people who are sick. Avoid taking medicines that contain aspirin, acetaminophen, ibuprofen, naproxen, or ketoprofen unless instructed by your health care professional. These medicines may hide a fever. Talk to your health care professional about your risk of cancer. You may be more at risk for certain types of cancer if you take this medicine. If you are going to need surgery or other procedure, tell your health care professional that you are using this medicine. Be careful brushing or flossing your teeth or using a toothpick because you may get an infection or bleed more easily. If you have any dental work done, tell your dentist you are receiving this medicine. What side effects may I notice from receiving this medication? Side effects that you should report to your doctor or health care professional as soon as possible: allergic reactions like skin rash, itching or hives, swelling of the face, lips, or  tongue breathing problems nausea, vomiting signs and symptoms of bleeding such as bloody or black, tarry stools; red or dark brown urine; spitting up blood or brown material that looks like coffee grounds; red spots on the skin; unusual bruising or bleeding from the eyes, gums, or nose signs and symptoms of heart failure like fast, irregular heartbeat, sudden weight gain; swelling of the ankles, feet, hands signs and symptoms of infection like fever; chills; cough; sore throat; pain or trouble passing urine signs and symptoms of kidney injury like trouble passing urine or change in the amount of urine signs and symptoms of liver injury like dark yellow or brown urine; general ill feeling or flu-like symptoms; light-colored stools; loss of appetite; nausea; right upper belly pain; unusually weak or tired; yellowing of the eyes or skin Side effects that usually do not require medical attention (report to your doctor or health care professional if they continue or are bothersome): confusion decreased hearing diarrhea facial flushing hair loss headache loss of appetite missed menstrual periods signs and symptoms of low red blood cells or anemia such as unusually weak or tired; feeling faint or lightheaded; falls skin discoloration This list may not describe all possible side effects. Call your doctor for medical advice about side effects. You may report side effects to FDA at 1-800-FDA-1088. Where should I keep my medication? This drug is given in a hospital or clinic and will not be stored at home. NOTE: This sheet is a summary. It may not cover all possible information. If you have questions about this medicine, talk to your doctor, pharmacist, or health care provider.  2022 Elsevier/Gold Standard (2021-06-17 00:00:00)  Rituximab Injection What is this medication? RITUXIMAB (ri TUX i mab) is a monoclonal antibody. It is used to treat certain types of cancer like non-Hodgkin lymphoma and  chronic lymphocytic leukemia. It is also used to treat rheumatoid arthritis, granulomatosis with polyangiitis, microscopic polyangiitis, and pemphigus vulgaris. This medicine may be used for other purposes; ask your health care provider or pharmacist if you have questions. COMMON BRAND NAME(S): RIABNI, Rituxan, RUXIENCE What should I tell my care team before I take this medication? They need to know if you have any of these conditions: chest pain heart disease infection especially a viral infection such as chickenpox, cold sores, hepatitis B, or herpes immune system problems irregular heartbeat or rhythm kidney disease low blood counts (white cells, platelets, or  red cells) lung disease recent or upcoming vaccine an unusual or allergic reaction to rituximab, other medicines, foods, dyes, or preservatives pregnant or trying to get pregnant breast-feeding How should I use this medication? This medicine is injected into a vein. It is given by a health care provider in a hospital or clinic setting. A special MedGuide will be given to you before each treatment. Be sure to read this information carefully each time. Talk to your health care provider about the use of this medicine in children. While this drug may be prescribed for children as young as 6 months for selected conditions, precautions do apply. Overdosage: If you think you have taken too much of this medicine contact a poison control center or emergency room at once. NOTE: This medicine is only for you. Do not share this medicine with others. What if I miss a dose? Keep appointments for follow-up doses. It is important not to miss your dose. Call your health care provider if you are unable to keep an appointment. What may interact with this medication? Do not take this medicine with any of the following medicines: live vaccines This medicine may also interact with the following medicines: cisplatin This list may not describe all  possible interactions. Give your health care provider a list of all the medicines, herbs, non-prescription drugs, or dietary supplements you use. Also tell them if you smoke, drink alcohol, or use illegal drugs. Some items may interact with your medicine. What should I watch for while using this medication? Your condition will be monitored carefully while you are receiving this medicine. You may need blood work done while you are taking this medicine. This medicine can cause serious infusion reactions. To reduce the risk your health care provider may give you other medicines to take before receiving this one. Be sure to follow the directions from your health care provider. This medicine may increase your risk of getting an infection. Call your health care provider for advice if you get a fever, chills, sore throat, or other symptoms of a cold or flu. Do not treat yourself. Try to avoid being around people who are sick. Call your health care provider if you are around anyone with measles, chickenpox, or if you develop sores or blisters that do not heal properly. Avoid taking medicines that contain aspirin, acetaminophen, ibuprofen, naproxen, or ketoprofen unless instructed by your health care provider. These medicines may hide a fever. This medicine may cause serious skin reactions. They can happen weeks to months after starting the medicine. Contact your health care provider right away if you notice fevers or flu-like symptoms with a rash. The rash may be red or purple and then turn into blisters or peeling of the skin. Or, you might notice a red rash with swelling of the face, lips or lymph nodes in your neck or under your arms. In some patients, this medicine may cause a serious brain infection that may cause death. If you have any problems seeing, thinking, speaking, walking, or standing, tell your healthcare professional right away. If you cannot reach your healthcare professional, urgently seek other  source of medical care. Do not become pregnant while taking this medicine or for at least 12 months after stopping it. Women should inform their health care provider if they wish to become pregnant or think they might be pregnant. There is potential for serious harm to an unborn child. Talk to your health care provider for more information. Women should use a reliable form of  birth control while taking this medicine and for 12 months after stopping it. Do not breast-feed while taking this medicine or for at least 6 months after stopping it. What side effects may I notice from receiving this medication? Side effects that you should report to your health care provider as soon as possible: allergic reactions (skin rash, itching or hives; swelling of the face, lips, or tongue) diarrhea edema (sudden weight gain; swelling of the ankles, feet, hands or other unusual swelling; trouble breathing) fast, irregular heartbeat heart attack (trouble breathing; pain or tightness in the chest, neck, back or arms; unusually weak or tired) infection (fever, chills, cough, sore throat, pain or trouble passing urine) kidney injury (trouble passing urine or change in the amount of urine) liver injury (dark yellow or brown urine; general ill feeling or flu-like symptoms; loss of appetite, right upper belly pain; unusually weak or tired, yellowing of the eyes or skin) low blood pressure (dizziness; feeling faint or lightheaded, falls; unusually weak or tired) low red blood cell counts (trouble breathing; feeling faint; lightheaded, falls; unusually weak or tired) mouth sores redness, blistering, peeling, or loosening of the skin, including inside the mouth stomach pain unusual bruising or bleeding wheezing (trouble breathing with loud or whistling sounds) vomiting Side effects that usually do not require medical attention (report to your health care provider if they continue or are bothersome): headache joint  pain muscle cramps, pain nausea This list may not describe all possible side effects. Call your doctor for medical advice about side effects. You may report side effects to FDA at 1-800-FDA-1088. Where should I keep my medication? This medicine is given in a hospital or clinic. It will not be stored at home. NOTE: This sheet is a summary. It may not cover all possible information. If you have questions about this medicine, talk to your doctor, pharmacist, or health care provider.  2022 [employer] (2020-09-30 00:00:00)

## 2021-09-20 ENCOUNTER — Other Ambulatory Visit: Payer: Self-pay

## 2021-09-20 ENCOUNTER — Inpatient Hospital Stay: Payer: 59

## 2021-09-20 VITALS — BP 139/81 | HR 100 | Temp 97.0°F | Resp 20

## 2021-09-20 DIAGNOSIS — Z7189 Other specified counseling: Secondary | ICD-10-CM

## 2021-09-20 DIAGNOSIS — C8330 Diffuse large B-cell lymphoma, unspecified site: Secondary | ICD-10-CM

## 2021-09-20 DIAGNOSIS — C8512 Unspecified B-cell lymphoma, intrathoracic lymph nodes: Secondary | ICD-10-CM | POA: Diagnosis not present

## 2021-09-20 MED ORDER — PEGFILGRASTIM-BMEZ 6 MG/0.6ML ~~LOC~~ SOSY
6.0000 mg | PREFILLED_SYRINGE | Freq: Once | SUBCUTANEOUS | Status: AC
Start: 2021-09-20 — End: 2021-09-20
  Administered 2021-09-20: 6 mg via SUBCUTANEOUS
  Filled 2021-09-20: qty 0.6

## 2021-09-20 NOTE — Patient Instructions (Signed)

## 2021-09-25 ENCOUNTER — Inpatient Hospital Stay: Payer: 59

## 2021-09-25 ENCOUNTER — Inpatient Hospital Stay (HOSPITAL_BASED_OUTPATIENT_CLINIC_OR_DEPARTMENT_OTHER): Payer: 59 | Admitting: Hematology

## 2021-09-25 ENCOUNTER — Other Ambulatory Visit: Payer: Self-pay

## 2021-09-25 VITALS — BP 107/75 | HR 111 | Temp 97.5°F | Resp 20 | Wt 238.7 lb

## 2021-09-25 DIAGNOSIS — R000 Tachycardia, unspecified: Secondary | ICD-10-CM | POA: Diagnosis not present

## 2021-09-25 DIAGNOSIS — C8330 Diffuse large B-cell lymphoma, unspecified site: Secondary | ICD-10-CM

## 2021-09-25 DIAGNOSIS — Z09 Encounter for follow-up examination after completed treatment for conditions other than malignant neoplasm: Secondary | ICD-10-CM | POA: Diagnosis not present

## 2021-09-25 DIAGNOSIS — T451X5A Adverse effect of antineoplastic and immunosuppressive drugs, initial encounter: Secondary | ICD-10-CM

## 2021-09-25 DIAGNOSIS — R110 Nausea: Secondary | ICD-10-CM

## 2021-09-25 DIAGNOSIS — C8512 Unspecified B-cell lymphoma, intrathoracic lymph nodes: Secondary | ICD-10-CM | POA: Diagnosis not present

## 2021-09-25 DIAGNOSIS — Z95828 Presence of other vascular implants and grafts: Secondary | ICD-10-CM

## 2021-09-25 LAB — CBC WITH DIFFERENTIAL (CANCER CENTER ONLY)
Abs Immature Granulocytes: 0.02 10*3/uL (ref 0.00–0.07)
Basophils Absolute: 0 10*3/uL (ref 0.0–0.1)
Basophils Relative: 2 %
Eosinophils Absolute: 0.1 10*3/uL (ref 0.0–0.5)
Eosinophils Relative: 6 %
HCT: 38.2 % (ref 36.0–46.0)
Hemoglobin: 13.2 g/dL (ref 12.0–15.0)
Immature Granulocytes: 1 %
Lymphocytes Relative: 64 %
Lymphs Abs: 1 10*3/uL (ref 0.7–4.0)
MCH: 30.8 pg (ref 26.0–34.0)
MCHC: 34.6 g/dL (ref 30.0–36.0)
MCV: 89 fL (ref 80.0–100.0)
Monocytes Absolute: 0.1 10*3/uL (ref 0.1–1.0)
Monocytes Relative: 4 %
Neutro Abs: 0.4 10*3/uL — CL (ref 1.7–7.7)
Neutrophils Relative %: 23 %
Platelet Count: 98 10*3/uL — ABNORMAL LOW (ref 150–400)
RBC: 4.29 MIL/uL (ref 3.87–5.11)
RDW: 12.2 % (ref 11.5–15.5)
WBC Count: 1.6 10*3/uL — ABNORMAL LOW (ref 4.0–10.5)
nRBC: 0 % (ref 0.0–0.2)

## 2021-09-25 LAB — CMP (CANCER CENTER ONLY)
ALT: 18 U/L (ref 0–44)
AST: 11 U/L — ABNORMAL LOW (ref 15–41)
Albumin: 3.8 g/dL (ref 3.5–5.0)
Alkaline Phosphatase: 111 U/L (ref 38–126)
Anion gap: 11 (ref 5–15)
BUN: 23 mg/dL — ABNORMAL HIGH (ref 6–20)
CO2: 27 mmol/L (ref 22–32)
Calcium: 9.3 mg/dL (ref 8.9–10.3)
Chloride: 101 mmol/L (ref 98–111)
Creatinine: 0.78 mg/dL (ref 0.44–1.00)
GFR, Estimated: >60 mL/min (ref >60)
Glucose, Bld: 158 mg/dL — ABNORMAL HIGH (ref 70–99)
Potassium: 3.1 mmol/L — ABNORMAL LOW (ref 3.5–5.1)
Sodium: 139 mmol/L (ref 135–145)
Total Bilirubin: 0.7 mg/dL (ref 0.3–1.2)
Total Protein: 6.6 g/dL (ref 6.5–8.1)

## 2021-09-25 LAB — LACTATE DEHYDROGENASE: LDH: 177 U/L (ref 98–192)

## 2021-09-25 MED ORDER — OMEPRAZOLE 40 MG PO CPDR: 40.0000 mg | DELAYED_RELEASE_CAPSULE | Freq: Every day | ORAL | 2 refills | Status: DC

## 2021-09-25 MED ORDER — METOPROLOL SUCCINATE ER 25 MG PO TB24: 25.0000 mg | ORAL_TABLET | Freq: Every day | ORAL | 1 refills | Status: DC

## 2021-09-25 MED ORDER — POTASSIUM CHLORIDE CRYS ER 20 MEQ PO TBCR: 20.0000 meq | EXTENDED_RELEASE_TABLET | Freq: Two times a day (BID) | ORAL | 0 refills | Status: DC

## 2021-09-25 MED ORDER — HEPARIN SOD (PORK) LOCK FLUSH 100 UNIT/ML IV SOLN
500.0000 [IU] | Freq: Once | INTRAVENOUS | Status: AC
  Administered 2021-09-25: 500 [IU]

## 2021-09-25 MED ORDER — SODIUM CHLORIDE 0.9% FLUSH
10.0000 mL | Freq: Once | INTRAVENOUS | Status: AC
  Administered 2021-09-25: 10 mL

## 2021-09-25 MED ORDER — B COMPLEX VITAMINS PO CAPS: 1.0000 | ORAL_CAPSULE | Freq: Every day | ORAL | Status: AC

## 2021-09-25 NOTE — Progress Notes (Signed)
CRITICAL VALUE STICKER  CRITICAL VALUE: ACN: [id number]  DATE & TIME NOTIFIED: 09/25/21 10:02  MD NOTIFIED: Irene Limbo  TIME OF NOTIFICATION:10:03

## 2021-09-30 ENCOUNTER — Telehealth: Payer: Self-pay | Admitting: Hematology

## 2021-09-30 NOTE — Telephone Encounter (Signed)
Scheduled follow-up appointments per 12/15 los. Patient is aware. °

## 2021-10-02 ENCOUNTER — Other Ambulatory Visit: Payer: Self-pay | Admitting: Hematology

## 2021-10-06 ENCOUNTER — Other Ambulatory Visit: Payer: Self-pay | Admitting: Hematology

## 2021-10-07 ENCOUNTER — Encounter: Payer: Self-pay | Admitting: Hematology

## 2021-10-09 ENCOUNTER — Inpatient Hospital Stay (HOSPITAL_BASED_OUTPATIENT_CLINIC_OR_DEPARTMENT_OTHER): Payer: 59 | Admitting: Hematology

## 2021-10-09 ENCOUNTER — Inpatient Hospital Stay: Payer: 59

## 2021-10-09 ENCOUNTER — Other Ambulatory Visit: Payer: Self-pay

## 2021-10-09 VITALS — BP 165/86 | HR 96 | Temp 97.7°F | Resp 16 | Wt 246.9 lb

## 2021-10-09 DIAGNOSIS — C8330 Diffuse large B-cell lymphoma, unspecified site: Secondary | ICD-10-CM

## 2021-10-09 DIAGNOSIS — C8512 Unspecified B-cell lymphoma, intrathoracic lymph nodes: Secondary | ICD-10-CM | POA: Diagnosis not present

## 2021-10-09 DIAGNOSIS — Z95828 Presence of other vascular implants and grafts: Secondary | ICD-10-CM

## 2021-10-09 DIAGNOSIS — Z5111 Encounter for antineoplastic chemotherapy: Secondary | ICD-10-CM

## 2021-10-09 LAB — CBC WITH DIFFERENTIAL (CANCER CENTER ONLY)
Abs Immature Granulocytes: 0.06 10*3/uL (ref 0.00–0.07)
Basophils Absolute: 0 10*3/uL (ref 0.0–0.1)
Basophils Relative: 1 %
Eosinophils Absolute: 0 10*3/uL (ref 0.0–0.5)
Eosinophils Relative: 0 %
HCT: 35.3 % — ABNORMAL LOW (ref 36.0–46.0)
Hemoglobin: 12 g/dL (ref 12.0–15.0)
Immature Granulocytes: 1 %
Lymphocytes Relative: 27 %
Lymphs Abs: 1.7 10*3/uL (ref 0.7–4.0)
MCH: 31 pg (ref 26.0–34.0)
MCHC: 34 g/dL (ref 30.0–36.0)
MCV: 91.2 fL (ref 80.0–100.0)
Monocytes Absolute: 0.9 10*3/uL (ref 0.1–1.0)
Monocytes Relative: 15 %
Neutro Abs: 3.6 10*3/uL (ref 1.7–7.7)
Neutrophils Relative %: 56 %
Platelet Count: 246 10*3/uL (ref 150–400)
RBC: 3.87 MIL/uL (ref 3.87–5.11)
RDW: 13.4 % (ref 11.5–15.5)
WBC Count: 6.3 10*3/uL (ref 4.0–10.5)
nRBC: 0.3 % — ABNORMAL HIGH (ref 0.0–0.2)

## 2021-10-09 LAB — CMP (CANCER CENTER ONLY)
ALT: 30 U/L (ref 0–44)
AST: 29 U/L (ref 15–41)
Albumin: 4 g/dL (ref 3.5–5.0)
Alkaline Phosphatase: 81 U/L (ref 38–126)
Anion gap: 7 (ref 5–15)
BUN: 17 mg/dL (ref 6–20)
CO2: 29 mmol/L (ref 22–32)
Calcium: 9.2 mg/dL (ref 8.9–10.3)
Chloride: 105 mmol/L (ref 98–111)
Creatinine: 0.75 mg/dL (ref 0.44–1.00)
GFR, Estimated: >60 mL/min (ref >60)
Glucose, Bld: 77 mg/dL (ref 70–99)
Potassium: 3.7 mmol/L (ref 3.5–5.1)
Sodium: 141 mmol/L (ref 135–145)
Total Bilirubin: 0.3 mg/dL (ref 0.3–1.2)
Total Protein: 6.6 g/dL (ref 6.5–8.1)

## 2021-10-09 LAB — LACTATE DEHYDROGENASE: LDH: 233 U/L — ABNORMAL HIGH (ref 98–192)

## 2021-10-09 MED ORDER — SODIUM CHLORIDE 0.9% FLUSH
10.0000 mL | Freq: Once | INTRAVENOUS | Status: AC
  Administered 2021-10-09: 14:00:00 10 mL

## 2021-10-09 MED ORDER — HEPARIN SOD (PORK) LOCK FLUSH 100 UNIT/ML IV SOLN
500.0000 [IU] | Freq: Once | INTRAVENOUS | Status: AC
  Administered 2021-10-09: 14:00:00 500 [IU]

## 2021-10-10 ENCOUNTER — Encounter: Payer: Self-pay | Admitting: Hematology

## 2021-10-10 ENCOUNTER — Inpatient Hospital Stay: Payer: 59

## 2021-10-10 VITALS — BP 135/78 | HR 91 | Temp 97.7°F | Resp 18

## 2021-10-10 DIAGNOSIS — C8330 Diffuse large B-cell lymphoma, unspecified site: Secondary | ICD-10-CM

## 2021-10-10 DIAGNOSIS — Z7189 Other specified counseling: Secondary | ICD-10-CM

## 2021-10-10 DIAGNOSIS — C8512 Unspecified B-cell lymphoma, intrathoracic lymph nodes: Secondary | ICD-10-CM | POA: Diagnosis not present

## 2021-10-10 DIAGNOSIS — Z95828 Presence of other vascular implants and grafts: Secondary | ICD-10-CM

## 2021-10-10 MED ORDER — SODIUM CHLORIDE 0.9 % IV SOLN
10.0000 mg | Freq: Once | INTRAVENOUS | Status: AC
  Administered 2021-10-10: 10:00:00 10 mg via INTRAVENOUS
  Filled 2021-10-10: qty 1

## 2021-10-10 MED ORDER — VINCRISTINE SULFATE CHEMO INJECTION 1 MG/ML
2.0000 mg | Freq: Once | INTRAVENOUS | Status: AC
  Administered 2021-10-10: 11:00:00 2 mg via INTRAVENOUS
  Filled 2021-10-10: qty 2

## 2021-10-10 MED ORDER — PALONOSETRON HCL INJECTION 0.25 MG/5ML
0.2500 mg | Freq: Once | INTRAVENOUS | Status: AC
  Administered 2021-10-10: 09:00:00 0.25 mg via INTRAVENOUS
  Filled 2021-10-10: qty 5

## 2021-10-10 MED ORDER — SODIUM CHLORIDE 0.9 % IV SOLN
375.0000 mg/m2 | Freq: Once | INTRAVENOUS | Status: AC
  Administered 2021-10-10: 12:00:00 800 mg via INTRAVENOUS
  Filled 2021-10-10: qty 50

## 2021-10-10 MED ORDER — HEPARIN SOD (PORK) LOCK FLUSH 100 UNIT/ML IV SOLN
500.0000 [IU] | Freq: Once | INTRAVENOUS | Status: AC
  Administered 2021-10-10: 15:00:00 500 [IU] via INTRAVENOUS

## 2021-10-10 MED ORDER — ACETAMINOPHEN 325 MG PO TABS
650.0000 mg | ORAL_TABLET | Freq: Once | ORAL | Status: AC
  Administered 2021-10-10: 09:00:00 650 mg via ORAL
  Filled 2021-10-10: qty 2

## 2021-10-10 MED ORDER — SODIUM CHLORIDE 0.9 % IV SOLN
750.0000 mg/m2 | Freq: Once | INTRAVENOUS | Status: AC
  Administered 2021-10-10: 12:00:00 1660 mg via INTRAVENOUS
  Filled 2021-10-10: qty 83

## 2021-10-10 MED ORDER — SODIUM CHLORIDE 0.9 % IV SOLN: INTRAVENOUS | Status: DC

## 2021-10-10 MED ORDER — DOXORUBICIN HCL CHEMO IV INJECTION 2 MG/ML
50.0000 mg/m2 | Freq: Once | INTRAVENOUS | Status: AC
  Administered 2021-10-10: 11:00:00 112 mg via INTRAVENOUS
  Filled 2021-10-10: qty 56

## 2021-10-10 MED ORDER — SODIUM CHLORIDE 0.9% FLUSH
10.0000 mL | Freq: Once | INTRAVENOUS | Status: AC
  Administered 2021-10-10: 15:00:00 10 mL via INTRAVENOUS

## 2021-10-10 MED ORDER — SODIUM CHLORIDE 0.9% FLUSH: 10.0000 mL | INTRAVENOUS | Status: DC | PRN

## 2021-10-10 MED ORDER — SODIUM CHLORIDE 0.9 % IV SOLN
150.0000 mg | Freq: Once | INTRAVENOUS | Status: AC
  Administered 2021-10-10: 10:00:00 150 mg via INTRAVENOUS
  Filled 2021-10-10: qty 5

## 2021-10-10 MED ORDER — DIPHENHYDRAMINE HCL 25 MG PO CAPS
50.0000 mg | ORAL_CAPSULE | Freq: Once | ORAL | Status: AC
  Administered 2021-10-10: 09:00:00 50 mg via ORAL
  Filled 2021-10-10: qty 2

## 2021-10-10 MED ORDER — HEPARIN SOD (PORK) LOCK FLUSH 100 UNIT/ML IV SOLN: 500.0000 [IU] | Freq: Once | INTRAVENOUS | Status: DC | PRN

## 2021-10-10 MED ORDER — FAMOTIDINE 20 MG IN NS 100 ML IVPB
20.0000 mg | Freq: Once | INTRAVENOUS | Status: AC
  Administered 2021-10-10: 09:00:00 20 mg via INTRAVENOUS
  Filled 2021-10-10: qty 100

## 2021-10-10 NOTE — Progress Notes (Signed)
. ° ° °HEMATOLOGY/ONCOLOGY CLINIC NOTE ° °Date of Service: .10/09/2021 ° ° °Patient Care Team: °Clark, Katherine K, NP as PCP - General (Internal Medicine) °Elsner, Henry, MD as Consulting Physician (Neurosurgery) °Wakefield, Matthew, MD as Consulting Physician (General Surgery) ° °CHIEF COMPLAINTS/PURPOSE OF CONSULTATION:  °Follow-up prior to planned cycle 2 of R-CHOP chemotherapy ° °HISTORY OF PRESENTING ILLNESS:  ° °Virginia Oneal is a wonderful 55 y.o. female who has been referred to us by Dr Henry Elsner MD for evaluation and management of newly diagnosed high grade B cell lymphoma. ° °Patient has a history of hypertension, seasonal allergies, gestational diabetes, depression/anxiety, COVID-19 infection in January 2022, kidney stones presented with gross hematuria for 3 days in February 2022.  °She was evaluated by her primary care provider and had a CT abdomen and pelvis on 11/20/2020 which showed small nonobstructive calculus in the right kidney.  No suspicious urinary tract mass.  She was noted to have new sclerosis of the T12 vertebral body concerning for possible osseous metastatic disease.  There was no evidence of primary malignancy in the abdomen or pelvis. °Patient subsequently had a bone scan on 12/23/2020 which showed 1. Intense activity within the posterior aspect of the T12 vertebralbody which extends in symmetric fashion through the LEFT and RIGHT pedicle. This is associated by sclerosis on the CT portion of the exam. The symmetric nature of the lesion a could indicate trauma. Recommend clinical correlation. Cannot exclude malignancy at this level. 2. No evidence of malignant lesions elsewhere on the whole-body scan. ° °Patient reports that she was referred to Dr. Henry Elsner for further evaluation of her T12 lesion and had a MRI of the thoracic spine with and without contrast on 02/20/2021 which showed Heterogeneous Decreased T1/T2 signal in the T12 vertebral body associated with sclerosis of that  vertebra on CT which is new since 2020. There is a small enhancing extraosseous component mildly projecting into the ventral epidural space which appears 1 or 2 mm larger since February (series 10, image 7), but no dural involvement or spinal stenosis. And there is a second smaller but similar lesion in the posterior right T7 body, also more conspicuous since February. ° There is a chronic and seemingly unrelated 4 cm T5-T6 left paraspinal cystic mass, stable since 2016. ° °Patient had a T12 vertebral body biopsy by Dr. Elsner on 04/07/2021 which was indeterminate and showed no overt malignancy. ° °Patient had a follow-up MRI of the thoracic spine on 07/04/2021 which showed 1. Mild interval progression of the T12 lesion, especially on the right where there is ventral epidural soft tissue which is non-compressive. Unchanged infiltrative lesion in the right T7 body and pedicle. ° °Patient had a repeat open transpedicular biopsy of T12 vertebra with Dr. Elsner on 07/31/2021 which showed high-grade non-Hodgkin's B-cell lymphoma with germinal center phenotype Ki-67 of 50 to 60%. °Prior study did not show c-Myc rearrangement. ° °Patient was referred to medical oncology for further evaluation and management. ° °INTERVAL HISTORY ° °.Virginia Oneal is here for follow-up prior to cycle 2 of her R-CHOP chemotherapy.  She notes no fevers no chills no night sweats. °She did have an episode of significant back pain that lasted several hours and resolved within 24 hours.  This was likely related to her G-CSF and resolved spontaneously and completely.  She also had some achiness in the rest of her bones but the back pain was particularly notable. °She took some Tylenol which did not control the pain completely. °We   discussed having additional stronger pain medications available in case she does have severe bone pain from the G-CSF again.  She has been taking loratadine.  She notes that she does have some Norco available from when  she received a prescription for pain control after her Port-A-Cath placement and she was recommended that it would be okay to take it if she did have severe uncontrolled pain not responding to NSAIDs. °Labs done today were reviewed.  Her neutropenia and thrombocytopenia have completely resolved other labs are stable. °Patient notes her fatigue is improved and she is eating well now.  No active nausea at this time. °Patient reports that her tachycardia resolved and she held off starting the Toprol-XL like to continue watching things at this time. ° °MEDICAL HISTORY:  °Past Medical History:  °Diagnosis Date  ° Abnormal uterine bleeding   ° Anemia   ° as a teenager  ° Anxiety   ° postpartum and hx panic attacks  ° Arrhythmia   ° Arthritis   ° back and hands  ° Chest mass   ° by CT scanning  ° Chest pain 07/26/2014  ° Complication of anesthesia   ° slow to wake up  ° COVID-19 10/2020  ° Depression   ° DOE (dyspnea on exertion) 07/26/2014  ° Family history of adverse reaction to anesthesia   ° mom slow to wake up  ° Gestational diabetes   ° resolved after pregnancy  ° History of kidney stones   ° Hypertension   ° Personal history of colonic polyps   ° PONV (postoperative nausea and vomiting)   ° PREMATURE ATRIAL CONTRACTIONS 08/15/2008  ° Qualifier: Diagnosis of  By: Schaller MD, Robert Neal   ° RENAL CALCULUS, HX OF 08/15/2008  ° Qualifier: Diagnosis of  By: Combs LPN, Wanda    ° Rotator cuff tendinitis 06/29/2011  ° Seasonal allergies   ° Urinary incontinence 05/2009  ° hx bladder sling--Dr. Silva  ° ° °SURGICAL HISTORY: °Past Surgical History:  °Procedure Laterality Date  ° ABDOMINAL HYSTERECTOMY  05/2009  ° TAH/LSO  ° BLADDER SUSPENSION  2010  ° Dr. Silva  ° BREAST BIOPSY    ° left stereo  ° CESAREAN SECTION  03/2003  ° CESAREAN SECTION  2004  ° CHOLECYSTECTOMY N/A 01/02/2021  ° Procedure: LAPAROSCOPIC CHOLECYSTECTOMY;  Surgeon: Wakefield, Matthew, MD;  Location: [facility];  Service: General;  Laterality: N/A;  58  - GEN AND  TAP BLOCK °LDOW  ° COLONOSCOPY  09/08  ° COLPORRHAPHY  05/2009  ° Dr. Silva  ° ENDOMETRIAL ABLATION  2006  ° Dr. Silva  ° INCONTINENCE SURGERY    ° sling for urinary incontinence  ° IR IMAGING GUIDED PORT INSERTION  09/01/2021  ° KYPHOPLASTY N/A 04/07/2021  ° Procedure: Thoracic 12 Vertebral body biopsy;  Surgeon: Elsner, Henry, MD;  Location: [facility];  Service: Neurosurgery;  Laterality: N/A;  ° LAPAROSCOPIC ENDOMETRIOSIS FULGURATION    ° ablation  ° LUMBAR LAMINECTOMY/DECOMPRESSION MICRODISCECTOMY N/A 07/31/2021  ° Procedure: Open Transpedicular biopsy of Thoracic twelve;  Surgeon: Elsner, Henry, MD;  Location: [facility];  Service: Neurosurgery;  Laterality: N/A;  ° OOPHORECTOMY  2010  ° w/hyst--Dr. Silva  ° TUBAL LIGATION  2004  ° w/ C-section Dr. Silva  ° TYMPANOPLASTY    ° right ear  ° VAGINAL HYSTERECTOMY  05/21/2009  ° lap assisted /partial  ° ° °SOCIAL HISTORY: °Social History  ° °Socioeconomic History  ° Marital status: Married  °  Spouse name: Not on file  °   Number of children: 2  ° Years of education: Not on file  ° Highest education level: Not on file  °Occupational History  ° Occupation: [employer]  °Tobacco Use  ° Smoking status: Former  °  Years: 10.00  °  Types: Cigarettes  °  Quit date: 01/06/1999  °  Years since quitting: 22.7  ° Smokeless tobacco: Never  °Vaping Use  ° Vaping Use: Never used  °Substance and Sexual Activity  ° Alcohol use: No  °  Alcohol/week: 0.0 standard drinks  ° Drug use: No  ° Sexual activity: Yes  °  Partners: Male  °  Birth control/protection: Other-see comments, Surgical  °  Comment: TAH/LSO  °Other Topics Concern  ° Not on file  °Social History Narrative  ° ** Merged History Encounter **  °    ° °Social Determinants of Health  ° °Financial Resource Strain: Not on file  °Food Insecurity: Not on file  °Transportation Needs: Not on file  °Physical Activity: Not on file  °Stress: Not on file  °Social Connections: Not on file  °Intimate Partner Violence: Not on file  ° ° °FAMILY  HISTORY: °Family History  °Problem Relation Age of Onset  ° Kidney disease Mother   °     renal failure  ° Stroke Mother   ° Diabetes Mother   ° Hypertension Mother   ° Heart failure Mother   ° Colon cancer Mother   ° Stroke Father   ° Cancer - Colon Maternal Aunt   ° Hypertension Sister   ° Rheum arthritis Sister   ° Hypertension Brother   ° Hypertension Sister   ° Hypertension Sister   ° Hypertension Sister   ° Breast cancer Sister   ° Kidney cancer Sister   ° Esophageal cancer Neg Hx   ° Rectal cancer Neg Hx   ° Stomach cancer Neg Hx   ° ° °ALLERGIES:  is allergic to buspar [buspirone hcl]. ° °MEDICATIONS:  °Current Outpatient Medications  °Medication Sig Dispense Refill  ° acetaminophen (TYLENOL) 500 MG tablet Take 1,000 mg by mouth every 6 (six) hours as needed for moderate pain or headache.    ° b complex vitamins capsule Take 1 capsule by mouth daily.    ° betamethasone valerate ointment (VALISONE) 0.1 % Apply 1 application topically 2 (two) times daily. (Patient taking differently: Apply 1 application topically 2 (two) times daily as needed (rash).) 45 g 1  ° Biotin 10000 MCG TABS Take 10,000 mcg by mouth daily.    ° Black Cohosh 40 MG CAPS Take 40 mg by mouth daily.    ° cetirizine (ZYRTEC) 10 MG tablet Take 10 mg by mouth daily.     ° Cholecalciferol (VITAMIN D3) 125 MCG (5000 UT) CAPS Take 5,000 Units by mouth daily.    ° COVID-19 mRNA bivalent vaccine, Moderna, (MODERNA COVID-19 BIVAL BOOSTER) 50 MCG/0.5ML injection Inject into the muscle. 0.5 mL 0  ° hydrochlorothiazide (HYDRODIURIL) 25 MG tablet Take 1 tablet (25 mg total) by mouth daily. For blood pressure. 90 tablet 3  ° HYDROcodone-acetaminophen (NORCO) 5-325 MG tablet Take 1-2 tablets by mouth every 6 (six) hours as needed for moderate pain or severe pain. 10 tablet 0  ° hydrOXYzine (ATARAX/VISTARIL) 10 MG tablet Take 1 or 2 tablets by mouth as needed for anxiety attack. 30 tablet 0  ° influenza vac split quadrivalent PF (FLUARIX) 0.5 ML injection  Inject into the muscle. 0.5 mL 0  ° ketorolac (TORADOL) 10 MG tablet Take   1 tablet (10 mg total) by mouth every 6 (six) hours as needed for moderate pain or severe pain. 20 tablet 0   KLOR-CON M20 20 MEQ tablet TAKE 1 TABLET BY MOUTH TWICE A DAY 30 tablet 0   lidocaine-prilocaine (EMLA) cream Apply to affected area once 30 g 3   LORazepam (ATIVAN) 0.5 MG tablet Take 1 tablet (0.5 mg total) by mouth every 12 (twelve) hours as needed for anxiety. 30 tablet 1   Maca 500 MG CAPS Take 500 mg by mouth daily.     Methylcobalamin (B-12) 5000 MCG TBDP Take 5,000 mcg by mouth daily.     metoprolol succinate (TOPROL XL) 25 MG 24 hr tablet Take 1 tablet (25 mg total) by mouth daily. 30 tablet 1   omeprazole (PRILOSEC) 40 MG capsule TAKE 1 CAPSULE (40 MG TOTAL) BY MOUTH DAILY. 90 capsule 1   ondansetron (ZOFRAN) 8 MG tablet Take 1 tablet (8 mg total) by mouth 2 (two) times daily as needed for refractory nausea / vomiting. Start on day 3 after cyclophosphamide chemotherapy. 30 tablet 1   predniSONE (DELTASONE) 20 MG tablet Take 3 tablets (60 mg total) by mouth daily. Take with food on days 1-5 of chemotherapy. 15 tablet 5   Prenatal Vit-Fe Fumarate-FA (PRENATAL MULTIVITAMIN) TABS tablet Take 1 tablet by mouth daily.     prochlorperazine (COMPAZINE) 10 MG tablet Take 1 tablet (10 mg total) by mouth every 6 (six) hours as needed (Nausea or vomiting). 30 tablet 6   Turmeric 500 MG CAPS Take 500 mg by mouth daily.     venlafaxine XR (EFFEXOR-XR) 37.5 MG 24 hr capsule TAKE 1 CAPSULE (37.5 MG TOTAL) BY MOUTH DAILY WITH BREAKFAST. FOR HOT FLASHES AND ANXIETY 90 capsule 1   No current facility-administered medications for this visit.   Facility-Administered Medications Ordered in Other Visits  Medication Dose Route Frequency Provider Last Rate Last Admin   0.9 %  sodium chloride infusion   Intravenous Continuous Brunetta Genera, MD 20 mL/hr at 10/10/21 0859 New Bag at 10/10/21 0859   cyclophosphamide (CYTOXAN)  1,660 mg in sodium chloride 0.9 % 250 mL chemo infusion  750 mg/m2 (Treatment Plan Recorded) Intravenous Once Brunetta Genera, MD       dexamethasone (DECADRON) 10 mg in sodium chloride 0.9 % 50 mL IVPB  10 mg Intravenous Once Brunetta Genera, MD       DOXOrubicin (ADRIAMYCIN) chemo injection 112 mg  50 mg/m2 (Treatment Plan Recorded) Intravenous Once Brunetta Genera, MD       famotidine (PEPCID) IVPB 20 mg in NS 100 mL IVPB  20 mg Intravenous Once Brunetta Genera, MD       fosaprepitant (EMEND) 150 mg in sodium chloride 0.9 % 145 mL IVPB  150 mg Intravenous Once Brunetta Genera, MD       heparin lock flush 100 unit/mL  500 Units Intracatheter Once PRN Brunetta Genera, MD       riTUXimab-pvvr (RUXIENCE) 800 mg in sodium chloride 0.9 % 250 mL (2.4242 mg/mL) infusion  375 mg/m2 (Treatment Plan Recorded) Intravenous Once Brunetta Genera, MD       sodium chloride flush (NS) 0.9 % injection 10 mL  10 mL Intracatheter PRN Brunetta Genera, MD       vinCRIStine (ONCOVIN) 2 mg in sodium chloride 0.9 % 50 mL chemo infusion  2 mg Intravenous Once Brunetta Genera, MD        REVIEW OF SYSTEMS:   .  10 Point review of Systems was done is negative except as noted above. ° °PHYSICAL EXAMINATION: °ECOG PERFORMANCE STATUS: 1 - Symptomatic but completely ambulatory ° °. °Vitals:  ° 10/09/21 1516  °BP: (!) 165/86  °Pulse: 96  °Resp: 16  °Temp: 97.7 °F (36.5 °C)  °SpO2: 96%  ° ° °Filed Weights  ° 10/09/21 1516  °Weight: 246 lb 14.4 oz (112 kg)  ° ° °.Body mass index is 42.38 kg/m². °. °GENERAL:alert, in no acute distress and comfortable °SKIN: no acute rashes, no significant lesions °EYES: conjunctiva are pink and non-injected, sclera anicteric °OROPHARYNX: MMM, no exudates, no oropharyngeal erythema or ulceration °NECK: supple, no JVD °LYMPH:  no palpable lymphadenopathy in the cervical, axillary or inguinal regions °LUNGS: clear to auscultation b/l with normal respiratory  effort °HEART: regular rate & rhythm °ABDOMEN:  normoactive bowel sounds , non tender, not distended. °Extremity: no pedal edema °PSYCH: alert & oriented x 3 with fluent speech °NEURO: no focal motor/sensory deficits ° °LABORATORY DATA:  °I have reviewed the data as listed ° °. °CBC Latest Ref Rng & Units 10/09/2021 09/25/2021 09/18/2021  °WBC 4.0 - 10.5 K/uL 6.3 1.6(L) 5.9  °Hemoglobin 12.0 - 15.0 g/dL 12.0 13.2 13.4  °Hematocrit 36.0 - 46.0 % 35.3(L) 38.2 39.9  °Platelets 150 - 400 K/uL 246 98(L) 154  ° °. °CMP Latest Ref Rng & Units 10/09/2021 09/25/2021 09/18/2021  °Glucose 70 - 99 mg/dL 77 158(H) 133(H)  °BUN 6 - 20 mg/dL 17 23(H) 20  °Creatinine 0.44 - 1.00 mg/dL 0.75 0.78 0.75  °Sodium 135 - 145 mmol/L 141 139 142  °Potassium 3.5 - 5.1 mmol/L 3.7 3.1(L) 3.7  °Chloride 98 - 111 mmol/L 105 101 106  °CO2 22 - 32 mmol/L 29 27 26  °Calcium 8.9 - 10.3 mg/dL 9.2 9.3 9.1  °Total Protein 6.5 - 8.1 g/dL 6.6 6.6 6.9  °Total Bilirubin 0.3 - 1.2 mg/dL 0.3 0.7 0.3  °Alkaline Phos 38 - 126 U/L 81 111 113  °AST 15 - 41 U/L 29 11(L) 19  °ALT 0 - 44 U/L 30 18 25  ° °. °Lab Results  °Component Value Date  ° LDH 233 (H) 10/09/2021  ° ° °HIV Screen 4th Generation wRfx °    Non Reactive Non Reactive  °Hepatitis B Surface Ag °    NON REACTIVE NON REACTIVE  °Hep B Core Total Ab °    NON REACTIVE NON REACTIVE  °HCV Ab °    NON REACTIVE NON REACTIVE  °LDH °    98 - 192 U/L 228 (H)  ° ° °SURGICAL PATHOLOGY  °CASE: [id number]  °PATIENT: Shaday Kreiser  °Surgical Pathology Report  ° ° ° ° °Clinical History: lesion of thoracic vertebra (cm)  ° °FINAL MICROSCOPIC DIAGNOSIS:  ° °A-C. BONE, T12 VERTEBRAL BODY AND MARROW, BIOPSY:  °-  Non-Hodgkin B-cell lymphoma, high grade  °-  See comment  ° °COMMENT:  ° °A-C.  The biopsies show normal hematopoeisis admixed with crush artifact  °with associated marrow fibrosis and an atypical cellular infiltrate  °composed of medium to large cells with irregular nuclear contours, ample  °cytoplasm, variably  prominent nucleoli, increased mitoses and apoptosis.  °An initial panel of immunohistochemistry (cytokeratin AE1/3, cytokeratin  °7, cytokeratin 20, CD20 and CD3) confirmed the morphologic impression  °that the atypical cells were lymphoid in nature.  The atypical  °lymphocytes are positive for Pax-5, CD20, CD10, bcl-6 but negative for  °CD30, bcl-2, mum-1 and EBV by in-situ hybridization. CD3 and CD5  °  highlight background T-cells.  The proliferative rate by ki-67 is  increased (50-60%).  Overall, the findings are consistent with a  non-Hodgkin B-cell lymphoma, high grade with a germinal center  phenotype.   FISH studies are pending and will be reported in an  addendum.  These findings were discussed with Dr. Ellene Route on August 06, 2021.   RADIOGRAPHIC STUDIES: I have personally reviewed the radiological images as listed and agreed with the findings in the report.  ECHO 09/08/2021 1.Left ventricular ejection fraction, by estimation, is 55 to 60%. The left ventricle has normal function. The left ventricle has no regional wall motion abnormalities. There ismild left ventricular hypertrophy. Left ventricular diastolic parameters were normal.The average left ventricular global longitudinal strain is -20.4 %. The global longitudinal strain is normal. 2. Right ventricular systolic function is normal. The right ventricular size is normal. 3. Left atrial size was mildly dilated. The mitral valve is normal in structure. Trivial mitral valve regurgitation. No evidence of mitral stenosis. Moderate mitral annular calcification. 4.The aortic valve is normal in structure. Aortic valve regurgitation is not visualized. No aortic stenosis is present. 5.The inferior vena cava is normal in size with greater than 50% respiratory variability, suggesting right atrial pressure of 3 mmHg.  No results found.  ASSESSMENT & PLAN:   55 year old female with  1) Stage IVA high-grade large B cell non-Hodgkin's  lymphoma.  Germinal center phenotype.  C-Myc not rearranged.  Ki-67 50 to 60% No overt constitutional symptoms PET/CT with involvement of thoracic spine and acromion process on the right.  Abdominal lymphadenopathy. PLAN Labs done on 08/20/2021 were reviewed in detail CBC within normal limits, CMP unremarkable, LDH slightly elevated at 228. Hepatitis testing unremarkable HIV nonreactive Myeloma panel shows no M spike protein spike.  Echo cardiogram shows normal ejection fraction  PET CT scan done on 09/02/2021 shows Scattered mild hypermetabolism within subcentimeter periportal, retroperitoneal and mesenteric lymph nodes, compatible with the given history of large B-cell lymphoma. 2. Hypermetabolism within known T7 and T12 lesions, better seen on MR thoracic spine 07/04/2021. 3. Focal hypermetabolism in the acromion of the right scapula, without a definite CT correlate. Recommend attention on follow-up.  2) Chemotherapy related neutropenia -now resolved.  No issues with infections.    3) G-CSF related significant bone pain. PLAN -Labs reviewed with the patient today.  Neutropenia resolved.  Thrombocytopenia resolved.  Other labs stable. -Patient reported significant G-CSF related back pain which resolved within 24 hours but was quite severe and not responding to Tylenol.  She does have Norco available from when her Port-A-Cath was placed and we discussed that that would be okay to use if needed.  She will also continue taking her loratadine daily for 7 to 10 days after each chemotherapy to also help with her bone pains. -Discussed importance of p.o. fluids and aerobic exercise like brisk walking for at least 20 to 30 minutes a day to help with her fatigue related to chemotherapy. -Other prohibitive toxicities at this time and will plan to proceed with cycle 2 of R-CHOP chemotherapy with same doses considering her neutropenia and counts resolved completely. -Discussed importance of continuing to  maintain infection precautions.  3) Patient with palpitations with mild persistent tachycardia with heart rates up to 120s to 130s on her apple watch. Patient notes that she has not had any palpitations since her last clinic visit. She noted that her heart rates have improved and are in the 80-100 range and she opted to not start the Toprol  XL. °PLAN °-Continue to monitor heart rate and symptoms of palpitation. °-Currently maintaining at least 2 L of water intake as discussed. °-If palpitations or perceived tachycardia return reasonable to start the Toprol-XL. ° °Follow-up °F/u for C3 of R-CHOP as scheduled 10/29/2021 ° °All of the patients questions were answered with apparent satisfaction. The patient knows to call the clinic with any problems, questions or concerns. °  °Gautam Kale MD MS AAHIVMS [facility] °Hematology/Oncology Physician °[facility] ° ° °

## 2021-10-10 NOTE — Progress Notes (Signed)
Patient presents for treatment. RN assessment completed along with the following:  Labs/vitals reviewed - Yes, and within treatment parameters.  10/09/21  Weight within 10% of previous measurement - Yes Oncology Treatment Attestation completed for current therapy- Yes, on date 09/14/21 Informed consent completed and reflects current therapy/intent - Yes, on date 09/18/21             Provider progress note reviewed - Patient not seen by provider today. Most recent note dated 09/18/21 reviewed. Treatment/Antibody/Supportive plan reviewed - Yes, and there are no adjustments needed for today's treatment. S&H and other orders reviewed - Yes, and there are no additional orders identified. Previous treatment date reviewed - Yes, and the appropriate amount of time has elapsed between treatments. Clinic Hand Off Received from - none   Patient to proceed with treatment.

## 2021-10-10 NOTE — Addendum Note (Signed)
Addended by: Tedd Sias on: 10/10/2021 10:59 AM   Modules accepted: Orders

## 2021-10-10 NOTE — Progress Notes (Signed)
[name]   HEMATOLOGY/ONCOLOGY CLINIC NOTE  Date of Service: .09/25/2021   Patient Care Team: Pleas Koch, NP as PCP - General (Internal Medicine) Kristeen Miss, MD as Consulting Physician (Neurosurgery) Rolm Bookbinder, MD as Consulting Physician (General Surgery)  CHIEF COMPLAINTS/PURPOSE OF CONSULTATION:  Follow-up for high-grade large B-cell lymphoma for toxicity check after cycle 1 of R-CHOP  HISTORY OF PRESENTING ILLNESS:   Virginia Oneal is a wonderful 55 y.o. female who has been referred to [name] by Dr Kristeen Miss MD for evaluation and management of newly diagnosed high grade B cell lymphoma.  Patient has a history of hypertension, seasonal allergies, gestational diabetes, depression/anxiety, COVID-19 infection in January 2022, kidney stones presented with gross hematuria for 3 days in February 2022.  She was evaluated by her primary care provider and had a CT abdomen and pelvis on 11/20/2020 which showed small nonobstructive calculus in the right kidney.  No suspicious urinary tract mass.  She was noted to have new sclerosis of the T12 vertebral body concerning for possible osseous metastatic disease.  There was no evidence of primary malignancy in the abdomen or pelvis. Patient subsequently had a bone scan on 12/23/2020 which showed 1. Intense activity within the posterior aspect of the T12 vertebralbody which extends in symmetric fashion through the LEFT and RIGHT pedicle. This is associated by sclerosis on the CT portion of the exam. The symmetric nature of the lesion a could indicate trauma. Recommend clinical correlation. Cannot exclude malignancy at this level. 2. No evidence of malignant lesions elsewhere on the whole-body scan.  Patient reports that she was referred to Dr. Kristeen Miss for further evaluation of her T12 lesion and had a MRI of the thoracic spine with and without contrast on 02/20/2021 which showed Heterogeneous Decreased T1/T2 signal in the T12 vertebral body  associated with sclerosis of that vertebra on CT which is new since 2020. There is a small enhancing extraosseous component mildly projecting into the ventral epidural space which appears 1 or 2 mm larger since February (series 10, image 7), but no dural involvement or spinal stenosis. And there is a second smaller but similar lesion in the posterior right T7 body, also more conspicuous since February.  There is a chronic and seemingly unrelated 4 cm T5-T6 left paraspinal cystic mass, stable since 2016.  Patient had a T12 vertebral body biopsy by Dr. Ellene Route on 04/07/2021 which was indeterminate and showed no overt malignancy.  Patient had a follow-up MRI of the thoracic spine on 07/04/2021 which showed 1. Mild interval progression of the T12 lesion, especially on the right where there is ventral epidural soft tissue which is non-compressive. Unchanged infiltrative lesion in the right T7 body and pedicle.  Patient had a repeat open transpedicular biopsy of T12 vertebra with Dr. Ellene Route on 07/31/2021 which showed high-grade non-Hodgkin's B-cell lymphoma with germinal center phenotype Ki-67 of 50 to 60%. Prior study did not show c-Myc rearrangement.  Patient was referred to medical oncology for further evaluation and management.  INTERVAL HISTORY  Patient is here for follow-up of her large B-cell lymphoma and for toxicity check after cycle 1 of R-CHOP.  She is accompanied by her husband for this visit. Patient notes a few days of fatigue and some body aches.  Mild grade 1 nausea that is controlled with her as needed nausea medications.  No vomiting. Notes no tingling or numbness in her hands or feet. No other acute new symptoms.  No skin rashes. That her heart rate was slightly  higher and she had some palpitations.  We discussed pros and cons and put her on the lowest dose of Toprol-XL and asked her to monitor her heart rate at home. Labs done today 09/25/2021 were reviewed in detail.  CBC was noted to  show leukopenia with neutropenia ANC of 400 hemoglobin within normal limits at 13.2, mild thrombocytopenia with platelets of 98k CMP within normal limits.   MEDICAL HISTORY:  Past Medical History:  Diagnosis Date   Abnormal uterine bleeding    Anemia    as a teenager   Anxiety    postpartum and hx panic attacks   Arrhythmia    Arthritis    back and hands   Chest mass    by CT scanning   Chest pain 62/69/4854   Complication of anesthesia    slow to wake up   COVID-19 10/2020   Depression    DOE (dyspnea on exertion) 07/26/2014   Family history of adverse reaction to anesthesia    mom slow to wake up   Gestational diabetes    resolved after pregnancy   History of kidney stones    Hypertension    Personal history of colonic polyps    PONV (postoperative nausea and vomiting)    PREMATURE ATRIAL CONTRACTIONS 08/15/2008   Qualifier: Diagnosis of  By: Council Mechanic MD, Hilaria Ota    RENAL CALCULUS, HX OF 08/15/2008   Qualifier: Diagnosis of  By: Zara Council LPN, Wanda     Rotator cuff tendinitis 06/29/2011   Seasonal allergies    Urinary incontinence 05/2009   hx bladder sling--Dr. Quincy Simmonds    SURGICAL HISTORY: Past Surgical History:  Procedure Laterality Date   ABDOMINAL HYSTERECTOMY  05/2009   TAH/LSO   BLADDER SUSPENSION  2010   Dr. Quincy Simmonds   BREAST BIOPSY     left stereo   CESAREAN SECTION  03/2003   CESAREAN SECTION  2004   CHOLECYSTECTOMY N/A 01/02/2021   Procedure: LAPAROSCOPIC CHOLECYSTECTOMY;  Surgeon: Rolm Bookbinder, MD;  Location: [facility];  Service: General;  Laterality: N/A;  58  - GEN AND TAP BLOCK LDOW   COLONOSCOPY  09/08   COLPORRHAPHY  05/2009   Dr. Quincy Simmonds   ENDOMETRIAL ABLATION  2006   Dr. Quincy Simmonds   INCONTINENCE SURGERY     sling for urinary incontinence   IR IMAGING GUIDED PORT INSERTION  09/01/2021   KYPHOPLASTY N/A 04/07/2021   Procedure: Thoracic 12 Vertebral body biopsy;  Surgeon: Kristeen Miss, MD;  Location: [place];  Service: Neurosurgery;  Laterality: N/A;    LAPAROSCOPIC ENDOMETRIOSIS FULGURATION     ablation   LUMBAR LAMINECTOMY/DECOMPRESSION MICRODISCECTOMY N/A 07/31/2021   Procedure: Open Transpedicular biopsy of Thoracic twelve;  Surgeon: Kristeen Miss, MD;  Location: [place];  Service: Neurosurgery;  Laterality: N/A;   OOPHORECTOMY  2010   w/hyst--Dr. Quincy Simmonds   TUBAL LIGATION  2004   w/ C-section Dr. Quincy Simmonds   TYMPANOPLASTY     right ear   VAGINAL HYSTERECTOMY  05/21/2009   lap assisted /partial    SOCIAL HISTORY: Social History   Socioeconomic History   Marital status: Married    Spouse name: Not on file   Number of children: 2   Years of education: Not on file   Highest education level: Not on file  Occupational History   Occupation: [employer]  Tobacco Use   Smoking status: Former    Years: 10.00    Types: Cigarettes    Quit date: 01/06/1999    Years since quitting:  22.7   Smokeless tobacco: Never  Vaping Use   Vaping Use: Never used  Substance and Sexual Activity   Alcohol use: No    Alcohol/week: 0.0 standard drinks   Drug use: No   Sexual activity: Yes    Partners: Male    Birth control/protection: Other-see comments, Surgical    Comment: TAH/LSO  Other Topics Concern   Not on file  Social History Narrative   ** Merged History Encounter **       Social Determinants of Health   Financial Resource Strain: Not on file  Food Insecurity: Not on file  Transportation Needs: Not on file  Physical Activity: Not on file  Stress: Not on file  Social Connections: Not on file  Intimate Partner Violence: Not on file    FAMILY HISTORY: Family History  Problem Relation Age of Onset   Kidney disease Mother        renal failure   Stroke Mother    Diabetes Mother    Hypertension Mother    Heart failure Mother    Colon cancer Mother    Stroke Father    Cancer - Colon Maternal Aunt    Hypertension Sister    Rheum arthritis Sister    Hypertension Brother    Hypertension Sister    Hypertension Sister     Hypertension Sister    Breast cancer Sister    Kidney cancer Sister    Esophageal cancer Neg Hx    Rectal cancer Neg Hx    Stomach cancer Neg Hx     ALLERGIES:  is allergic to buspar [buspirone hcl].  MEDICATIONS:  Current Outpatient Medications  Medication Sig Dispense Refill   b complex vitamins capsule Take 1 capsule by mouth daily.     metoprolol succinate (TOPROL XL) 25 MG 24 hr tablet Take 1 tablet (25 mg total) by mouth daily. 30 tablet 1   acetaminophen (TYLENOL) 500 MG tablet Take 1,000 mg by mouth every 6 (six) hours as needed for moderate pain or headache.     betamethasone valerate ointment (VALISONE) 0.1 % Apply 1 application topically 2 (two) times daily. (Patient taking differently: Apply 1 application topically 2 (two) times daily as needed (rash).) 45 g 1   Biotin 10000 MCG TABS Take 10,000 mcg by mouth daily.     Black Cohosh 40 MG CAPS Take 40 mg by mouth daily.     cetirizine (ZYRTEC) 10 MG tablet Take 10 mg by mouth daily.      Cholecalciferol (VITAMIN D3) 125 MCG (5000 UT) CAPS Take 5,000 Units by mouth daily.     COVID-19 mRNA bivalent vaccine, Moderna, (MODERNA COVID-19 BIVAL BOOSTER) 50 MCG/0.5ML injection Inject into the muscle. 0.5 mL 0   hydrochlorothiazide (HYDRODIURIL) 25 MG tablet Take 1 tablet (25 mg total) by mouth daily. For blood pressure. 90 tablet 3   HYDROcodone-acetaminophen (NORCO) 5-325 MG tablet Take 1-2 tablets by mouth every 6 (six) hours as needed for moderate pain or severe pain. 10 tablet 0   hydrOXYzine (ATARAX/VISTARIL) 10 MG tablet Take 1 or 2 tablets by mouth as needed for anxiety attack. 30 tablet 0   influenza vac split quadrivalent PF (FLUARIX) 0.5 ML injection Inject into the muscle. 0.5 mL 0   ketorolac (TORADOL) 10 MG tablet Take 1 tablet (10 mg total) by mouth every 6 (six) hours as needed for moderate pain or severe pain. 20 tablet 0   KLOR-CON M20 20 MEQ tablet TAKE 1 TABLET BY MOUTH TWICE A  DAY 30 tablet 0   lidocaine-prilocaine  (EMLA) cream Apply to affected area once 30 g 3   LORazepam (ATIVAN) 0.5 MG tablet Take 1 tablet (0.5 mg total) by mouth every 12 (twelve) hours as needed for anxiety. 30 tablet 1   [place] 500 MG CAPS Take 500 mg by mouth daily.     Methylcobalamin (B-12) 5000 MCG TBDP Take 5,000 mcg by mouth daily.     omeprazole (PRILOSEC) 40 MG capsule TAKE 1 CAPSULE (40 MG TOTAL) BY MOUTH DAILY. 90 capsule 1   ondansetron (ZOFRAN) 8 MG tablet Take 1 tablet (8 mg total) by mouth 2 (two) times daily as needed for refractory nausea / vomiting. Start on day 3 after cyclophosphamide chemotherapy. 30 tablet 1   predniSONE (DELTASONE) 20 MG tablet Take 3 tablets (60 mg total) by mouth daily. Take with food on days 1-5 of chemotherapy. 15 tablet 5   Prenatal Vit-Fe Fumarate-FA (PRENATAL MULTIVITAMIN) TABS tablet Take 1 tablet by mouth daily.     prochlorperazine (COMPAZINE) 10 MG tablet Take 1 tablet (10 mg total) by mouth every 6 (six) hours as needed (Nausea or vomiting). 30 tablet 6   Turmeric 500 MG CAPS Take 500 mg by mouth daily.     venlafaxine XR (EFFEXOR-XR) 37.5 MG 24 hr capsule TAKE 1 CAPSULE (37.5 MG TOTAL) BY MOUTH DAILY WITH BREAKFAST. FOR HOT FLASHES AND ANXIETY 90 capsule 1   No current facility-administered medications for this visit.   Facility-Administered Medications Ordered in Other Visits  Medication Dose Route Frequency Provider Last Rate Last Admin   0.9 %  sodium chloride infusion   Intravenous Continuous Brunetta Genera, MD 20 mL/hr at 10/10/21 0859 New Bag at 10/10/21 0859   acetaminophen (TYLENOL) tablet 650 mg  650 mg Oral Q6H PRN Brunetta Genera, MD       dexamethasone (DECADRON) 10 mg in sodium chloride 0.9 % 50 mL IVPB  10 mg Intravenous Once Brunetta Genera, MD       diphenhydrAMINE (BENADRYL) capsule 50 mg  50 mg Oral Q6H PRN Brunetta Genera, MD       famotidine (PEPCID) IVPB 20 mg in NS 100 mL IVPB  20 mg Intravenous Once Brunetta Genera, MD        fosaprepitant (EMEND) 150 mg in sodium chloride 0.9 % 145 mL IVPB  150 mg Intravenous Once Brunetta Genera, MD       palonosetron (ALOXI) injection 0.25 mg  0.25 mg Intravenous Once Brunetta Genera, MD        REVIEW OF SYSTEMS:   .10 Point review of Systems was done is negative except as noted above.  PHYSICAL EXAMINATION: ECOG PERFORMANCE STATUS: 1 - Symptomatic but completely ambulatory  . Vitals:   09/25/21 0925  BP: 107/75  Pulse: (!) 111  Resp: 20  Temp: (!) 97.5 F (36.4 C)  SpO2: 98%    Filed Weights   09/25/21 0925  Weight: 238 lb 11.2 oz (108.3 kg)    .Body mass index is 40.97 kg/m. [name] GENERAL:alert, in no acute distress and comfortable SKIN: no acute rashes, no significant lesions EYES: conjunctiva are pink and non-injected, sclera anicteric OROPHARYNX: MMM, no exudates, no oropharyngeal erythema or ulceration NECK: supple, no JVD LYMPH:  no palpable lymphadenopathy in the cervical, axillary or inguinal regions LUNGS: clear to auscultation b/l with normal respiratory effort HEART: regular rate & rhythm ABDOMEN:  normoactive bowel sounds , non tender, not distended. Extremity: no pedal edema PSYCH:  alert & oriented x 3 with fluent speech NEURO: no focal motor/sensory deficits   LABORATORY DATA:  I have reviewed the data as listed  . CBC Latest Ref Rng & Units 09/25/2021 09/18/2021  WBC 4.0 - 10.5 K/uL 1.6(L) 5.9  Hemoglobin 12.0 - 15.0 g/dL 13.2 13.4  Hematocrit 36.0 - 46.0 % 38.2 39.9  Platelets 150 - 400 K/uL 98(L) 154   . CMP Latest Ref Rng & Units 09/25/2021 09/18/2021  Glucose 70 - 99 mg/dL 158(H) 133(H)  BUN 6 - 20 mg/dL 23(H) 20  Creatinine 0.44 - 1.00 mg/dL 0.78 0.75  Sodium 135 - 145 mmol/L 139 142  Potassium 3.5 - 5.1 mmol/L 3.1(L) 3.7  Chloride 98 - 111 mmol/L 101 106  CO2 22 - 32 mmol/L 27 26  Calcium 8.9 - 10.3 mg/dL 9.3 9.1  Total Protein 6.5 - 8.1 g/dL 6.6 6.9  Total Bilirubin 0.3 - 1.2 mg/dL 0.7 0.3  Alkaline Phos 38 -  126 U/L 111 113  AST 15 - 41 U/L 11(L) 19  ALT 0 - 44 U/L 18 25   Component     Latest Ref Rng & Units 08/20/2021  IgG (Immunoglobin G), Serum     586 - 1,602 mg/dL 789  IgA     87 - 352 mg/dL 337  IgM (Immunoglobulin M), Srm     26 - 217 mg/dL 51  Total Protein ELP     6.0 - 8.5 g/dL 6.8  Albumin SerPl Elph-Mcnc     2.9 - 4.4 g/dL 3.9  Alpha 1     0.0 - 0.4 g/dL 0.2  Alpha2 Glob SerPl Elph-Mcnc     0.4 - 1.0 g/dL 0.7  B-Globulin SerPl Elph-Mcnc     0.7 - 1.3 g/dL 1.3  Gamma Glob SerPl Elph-Mcnc     0.4 - 1.8 g/dL 0.8  M Protein SerPl Elph-Mcnc     Not Observed g/dL Not Observed  Globulin, Total     2.2 - 3.9 g/dL 2.9  Albumin/Glob SerPl     0.7 - 1.7 1.4  IFE 1      Comment  Please Note (HCV):      Comment  HIV Screen 4th Generation wRfx     Non Reactive Non Reactive  Hepatitis B Surface Ag     NON REACTIVE NON REACTIVE  Hep B Core Total Ab     NON REACTIVE NON REACTIVE  HCV Ab     NON REACTIVE NON REACTIVE  LDH     98 - 192 U/L 228 (H)    SURGICAL PATHOLOGY  CASE: [id number]  PATIENT: Lyvia Magro  Surgical Pathology Report      Clinical History: lesion of thoracic vertebra (cm)      FINAL MICROSCOPIC DIAGNOSIS:   A-C. BONE, T12 VERTEBRAL BODY AND MARROW, BIOPSY:  -  Non-Hodgkin B-cell lymphoma, high grade  -  See comment   COMMENT:   A-C.  The biopsies show normal hematopoeisis admixed with crush artifact  with associated marrow fibrosis and an atypical cellular infiltrate  composed of medium to large cells with irregular nuclear contours, ample  cytoplasm, variably prominent nucleoli, increased mitoses and apoptosis.  An initial panel of immunohistochemistry (cytokeratin AE1/3, cytokeratin  7, cytokeratin 20, CD20 and CD3) confirmed the morphologic impression  that the atypical cells were lymphoid in nature.  The atypical  lymphocytes are positive for Pax-5, CD20, CD10, bcl-6 but negative for  CD30, bcl-2, mum-1 and EBV by in-situ  hybridization. CD3 and CD5  highlight background T-cells.  The proliferative rate by ki-67 is  increased (50-60%).  Overall, the findings are consistent with a  non-Hodgkin B-cell lymphoma, high grade with a germinal center  phenotype.   FISH studies are pending and will be reported in an  addendum.  These findings were discussed with Dr. Ellene Route on August 06, 2021.   RADIOGRAPHIC STUDIES: I have personally reviewed the radiological images as listed and agreed with the findings in the report.  ECHO 09/08/2021 1.Left ventricular ejection fraction, by estimation, is 55 to 60%. The left ventricle has normal function. The left ventricle has no regional wall motion abnormalities. There ismild left ventricular hypertrophy. Left ventricular diastolic parameters were normal.The average left ventricular global longitudinal strain is -20.4 %. The global longitudinal strain is normal. 2. Right ventricular systolic function is normal. The right ventricular size is normal. 3. Left atrial size was mildly dilated. The mitral valve is normal in structure. Trivial mitral valve regurgitation. No evidence of mitral stenosis. Moderate mitral annular calcification. 4.The aortic valve is normal in structure. Aortic valve regurgitation is not visualized. No aortic stenosis is present. 5.The inferior vena cava is normal in size with greater than 50% respiratory variability, suggesting right atrial pressure of 3 mmHg.  No results found.  ASSESSMENT & PLAN:   55 year old female with  1) Recently diagnosed stage IVA high-grade large B cell non-Hodgkin's lymphoma.  Germinal center phenotype.  C-Myc not rearranged.  Ki-67 50 to 60% No overt constitutional symptoms PET/CT with involvement of thoracic spine and acromion process on the right.  Abdominal lymphadenopathy. PLAN Labs done on 08/20/2021 were reviewed in detail CBC within normal limits, CMP unremarkable, LDH slightly elevated at 228. Hepatitis testing  unremarkable HIV nonreactive Myeloma panel shows no M spike protein spike.  Echo cardiogram shows normal ejection fraction  PET CT scan done on 09/02/2021 shows Scattered mild hypermetabolism within subcentimeter periportal, retroperitoneal and mesenteric lymph nodes, compatible with the given history of large B-cell lymphoma. 2. Hypermetabolism within known T7 and T12 lesions, better seen on MR thoracic spine 07/04/2021. 3. Focal hypermetabolism in the acromion of the right scapula, without a definite CT correlate. Recommend attention on follow-up.  2) chemotherapy related neutropenia -patient has already received Udenyca and should start bouncing her counts.  No fevers or signs of infection. PLAN -Labs done today were reviewed with the patient in detail. -We discussed that her counts are at their nadir and she is neutropenic but has already received growth factor support and her counts should start bouncing back. -She was recommended to take neutropenic precautions and call us immediately if any signs of infections or fevers arose. -Some mild grade 1 nausea controlled with current supportive medications. -Grade 1 fatigue related to chemotherapy which is currently improving. -No other acute prohibitive toxicities noted.  3) patient with palpitations with mild persistent tachycardia with heart rates up to 120s to 130s on her apple watch. -We discussed that this could be related to dehydration or chemotherapy. -Since she is having palpitations though not currently we discussed and decided to put her on low-dose Toprol XL at 25 mg p.o. daily and closely monitor heart rates at home and report any new symptoms if they arise again. -She was also recommended to maintain good p.o. hydration with at least 2 L of water daily.  Follow-up -Please schedule for cycle 2 of R-CHOP with port flush labs and MD visit as ordered on 10/09/2021 -Please schedule for cycle #3 of R-CHOP with port flush labs  and MD  visit as per orders.  All of the patients questions were answered with apparent satisfaction. The patient knows to call the clinic with any problems, questions or concerns.  . The total time spent in the appointment was 32 minutes including time spent reviewing results, performing detailed toxicity check and providing recommendations, talking about neutropenic precautions and discussing and management of chemotherapy related tachycardia.    Sullivan Lone MD [place] AAHIVMS [facility] Hematology/Oncology Physician [facility]

## 2021-10-10 NOTE — Addendum Note (Signed)
Addended by: Tedd Sias on: 10/10/2021 03:30 PM   Modules accepted: Orders

## 2021-10-10 NOTE — Patient Instructions (Signed)
[facility]   Discharge Instructions: Thank you for choosing [facility] to provide your oncology and hematology care.   If you have a lab appointment with the [facility], please go directly to the [facility] and check in at the registration area.   Wear comfortable clothing and clothing appropriate for easy access to any Portacath or PICC line.   We strive to give you quality time with your provider. You may need to reschedule your appointment if you arrive late (15 or more minutes).  Arriving late affects you and other patients whose appointments are after yours.  Also, if you miss three or more appointments without notifying the office, you may be dismissed from the clinic at the providers discretion.      For prescription refill requests, have your pharmacy contact our office and allow 72 hours for refills to be completed.    Today you received the following chemotherapy and/or immunotherapy agents Doxorubicin, vincristine, cytoxan, rituximab      To help prevent nausea and vomiting after your treatment, we encourage you to take your nausea medication as directed.  BELOW ARE SYMPTOMS THAT SHOULD BE REPORTED IMMEDIATELY: *FEVER GREATER THAN 100.4 F (38 C) OR HIGHER *CHILLS OR SWEATING *NAUSEA AND VOMITING THAT IS NOT CONTROLLED WITH YOUR NAUSEA MEDICATION *UNUSUAL SHORTNESS OF BREATH *UNUSUAL BRUISING OR BLEEDING *URINARY PROBLEMS (pain or burning when urinating, or frequent urination) *BOWEL PROBLEMS (unusual diarrhea, constipation, pain near the anus) TENDERNESS IN MOUTH AND THROAT WITH OR WITHOUT PRESENCE OF ULCERS (sore throat, sores in mouth, or a toothache) UNUSUAL RASH, SWELLING OR PAIN  UNUSUAL VAGINAL DISCHARGE OR ITCHING   Items with * indicate a potential emergency and should be followed up as soon as possible or go to the Emergency Department if any problems should occur.  Please show the CHEMOTHERAPY ALERT CARD or  IMMUNOTHERAPY ALERT CARD at check-in to the Emergency Department and triage nurse.  Should you have questions after your visit or need to cancel or reschedule your appointment, please contact [facility]  Dept: [phone]  and follow the prompts.  Office hours are 8:00 a.m. to 4:30 p.m. Monday - Friday. Please note that voicemails left after 4:00 p.m. may not be returned until the following business day.  We are closed weekends and major holidays. You have access to a nurse at all times for urgent questions. Please call the main number to the clinic Dept: [phone] and follow the prompts.   For any non-urgent questions, you may also contact your provider using MyChart. We now offer e-Visits for anyone 32 and older to request care online for non-urgent symptoms. For details visit [email or web address].   Also download the MyChart app! Go to the app store, search "MyChart", open the app, select [facility], and log in with your MyChart username and password.  Due to Covid, a mask is required upon entering the hospital/clinic. If you do not have a mask, one will be given to you upon arrival. For doctor visits, patients may have 1 support person aged 54 or older with them. For treatment visits, patients cannot have anyone with them due to current Covid guidelines and our immunocompromised population.   Doxorubicin injection What is this medication? DOXORUBICIN (dox oh ROO bi sin) is a chemotherapy drug. It is used to treat many kinds of cancer like leukemia, lymphoma, neuroblastoma, sarcoma, and Wilms' tumor. It is also used to treat bladder cancer, breast cancer, lung cancer,  ovarian cancer, stomach cancer, and thyroid cancer. This medicine may be used for other purposes; ask your health care provider or pharmacist if you have questions. COMMON BRAND NAME(S): Adriamycin, Adriamycin PFS, Adriamycin RDF, Rubex What should I tell my care team before I take this  medication? They need to know if you have any of these conditions: heart disease history of low blood counts caused by a medicine liver disease recent or ongoing radiation therapy an unusual or allergic reaction to doxorubicin, other chemotherapy agents, other medicines, foods, dyes, or preservatives pregnant or trying to get pregnant breast-feeding How should I use this medication? This drug is given as an infusion into a vein. It is administered in a hospital or clinic by a specially trained health care professional. If you have pain, swelling, burning or any unusual feeling around the site of your injection, tell your health care professional right away. Talk to your pediatrician regarding the use of this medicine in children. Special care may be needed. Overdosage: If you think you have taken too much of this medicine contact a poison control center or emergency room at once. NOTE: This medicine is only for you. Do not share this medicine with others. What if I miss a dose? It is important not to miss your dose. Call your doctor or health care professional if you are unable to keep an appointment. What may interact with this medication? This medicine may interact with the following medications: 6-mercaptopurine paclitaxel phenytoin St. John's Wort trastuzumab verapamil This list may not describe all possible interactions. Give your health care provider a list of all the medicines, herbs, non-prescription drugs, or dietary supplements you use. Also tell them if you smoke, drink alcohol, or use illegal drugs. Some items may interact with your medicine. What should I watch for while using this medication? This drug may make you feel generally unwell. This is not uncommon, as chemotherapy can affect healthy cells as well as cancer cells. Report any side effects. Continue your course of treatment even though you feel ill unless your doctor tells you to stop. There is a maximum amount of  this medicine you should receive throughout your life. The amount depends on the medical condition being treated and your overall health. Your doctor will watch how much of this medicine you receive in your lifetime. Tell your doctor if you have taken this medicine before. You may need blood work done while you are taking this medicine. Your urine may turn red for a few days after your dose. This is not blood. If your urine is dark or brown, call your doctor. In some cases, you may be given additional medicines to help with side effects. Follow all directions for their use. Call your doctor or health care professional for advice if you get a fever, chills or sore throat, or other symptoms of a cold or flu. Do not treat yourself. This drug decreases your body's ability to fight infections. Try to avoid being around people who are sick. This medicine may increase your risk to bruise or bleed. Call your doctor or health care professional if you notice any unusual bleeding. Talk to your doctor about your risk of cancer. You may be more at risk for certain types of cancers if you take this medicine. Do not become pregnant while taking this medicine or for 6 months after stopping it. Women should inform their doctor if they wish to become pregnant or think they might be pregnant. Men should not father a  child while taking this medicine and for 6 months after stopping it. There is a potential for serious side effects to an unborn child. Talk to your health care professional or pharmacist for more information. Do not breast-feed an infant while taking this medicine. This medicine has caused ovarian failure in some women and reduced sperm counts in some men This medicine may interfere with the ability to have a child. Talk with your doctor or health care professional if you are concerned about your fertility. This medicine may cause a decrease in Co-Enzyme Q-10. You should make sure that you get enough Co-Enzyme  Q-10 while you are taking this medicine. Discuss the foods you eat and the vitamins you take with your health care professional. What side effects may I notice from receiving this medication? Side effects that you should report to your doctor or health care professional as soon as possible: allergic reactions like skin rash, itching or hives, swelling of the face, lips, or tongue breathing problems chest pain fast or irregular heartbeat low blood counts - this medicine may decrease the number of white blood cells, red blood cells and platelets. You may be at increased risk for infections and bleeding. pain, redness, or irritation at site where injected signs of infection - fever or chills, cough, sore throat, pain or difficulty passing urine signs of decreased platelets or bleeding - bruising, pinpoint red spots on the skin, black, tarry stools, blood in the urine swelling of the ankles, feet, hands tiredness weakness Side effects that usually do not require medical attention (report to your doctor or health care professional if they continue or are bothersome): diarrhea hair loss mouth sores nail discoloration or damage nausea red colored urine vomiting This list may not describe all possible side effects. Call your doctor for medical advice about side effects. You may report side effects to FDA at 1-800-FDA-1088. Where should I keep my medication? This drug is given in a hospital or clinic and will not be stored at home. NOTE: This sheet is a summary. It may not cover all possible information. If you have questions about this medicine, talk to your doctor, pharmacist, or health care provider.  2022 Elsevier/Gold Standard (2017-06-03 00:00:00)  Vincristine injection What is this medication? VINCRISTINE (vin KRIS teen) is a chemotherapy drug. It slows the growth of cancer cells. This medicine is used to treat many types of cancer like Hodgkin's disease, leukemia, non-Hodgkin's  lymphoma, neuroblastoma (brain cancer), rhabdomyosarcoma, and Wilms' tumor. This medicine may be used for other purposes; ask your health care provider or pharmacist if you have questions. COMMON BRAND NAME(S): Oncovin, Vincasar PFS What should I tell my care team before I take this medication? They need to know if you have any of these conditions: blood disorders gout infection (especially chickenpox, cold sores, or herpes) kidney disease liver disease lung disease nervous system disease like Charcot-Marie-Tooth (CMT) recent or ongoing radiation therapy an unusual or allergic reaction to vincristine, other chemotherapy agents, other medicines, foods, dyes, or preservatives pregnant or trying to get pregnant breast-feeding How should I use this medication? This drug is given as an infusion into a vein. It is administered in a hospital or clinic by a specially trained health care professional. If you have pain, swelling, burning, or any unusual feeling around the site of your injection, tell your health care professional right away. Talk to your pediatrician regarding the use of this medicine in children. While this drug may be prescribed for selected conditions, precautions do  apply. Overdosage: If you think you have taken too much of this medicine contact a poison control center or emergency room at once. NOTE: This medicine is only for you. Do not share this medicine with others. What if I miss a dose? It is important not to miss your dose. Call your doctor or health care professional if you are unable to keep an appointment. What may interact with this medication? certain medicines for fungal infections like itraconazole, ketoconazole, posaconazole, voriconazole certain medicines for seizures like phenytoin This list may not describe all possible interactions. Give your health care provider a list of all the medicines, herbs, non-prescription drugs, or dietary supplements you use. Also  tell them if you smoke, drink alcohol, or use illegal drugs. Some items may interact with your medicine. What should I watch for while using this medication? This drug may make you feel generally unwell. This is not uncommon, as chemotherapy can affect healthy cells as well as cancer cells. Report any side effects. Continue your course of treatment even though you feel ill unless your doctor tells you to stop. You may need blood work done while you are taking this medicine. This medicine will cause constipation. Try to have a bowel movement at least every 2 to 3 days. If you do not have a bowel movement for 3 days, call your doctor or health care professional. In some cases, you may be given additional medicines to help with side effects. Follow all directions for their use. Do not become pregnant while taking this medicine. Women should inform their doctor if they wish to become pregnant or think they might be pregnant. There is a potential for serious side effects to an unborn child. Talk to your health care professional or pharmacist for more information. Do not breast-feed an infant while taking this medicine. This medicine may make it more difficult to get pregnant or to father a child. Talk to your healthcare professional if you are concerned about your fertility. What side effects may I notice from receiving this medication? Side effects that you should report to your doctor or health care professional as soon as possible: allergic reactions like skin rash, itching or hives, swelling of the face, lips, or tongue breathing problems confusion or changes in emotions or moods constipation cough mouth sores muscle weakness nausea and vomiting pain, swelling, redness or irritation at the injection site pain, tingling, numbness in the hands or feet problems with balance, talking, walking seizures stomach pain trouble passing urine or change in the amount of urine Side effects that usually do  not require medical attention (report to your doctor or health care professional if they continue or are bothersome): diarrhea hair loss jaw pain loss of appetite This list may not describe all possible side effects. Call your doctor for medical advice about side effects. You may report side effects to FDA at 1-800-FDA-1088. Where should I keep my medication? This drug is given in a hospital or clinic and will not be stored at home. NOTE: This sheet is a summary. It may not cover all possible information. If you have questions about this medicine, talk to your doctor, pharmacist, or health care provider.  2022 Elsevier/Gold Standard (2021-06-17 00:00:00)  Cyclophosphamide Injection What is this medication? CYCLOPHOSPHAMIDE (sye kloe FOSS fa mide) is a chemotherapy drug. It slows the growth of cancer cells. This medicine is used to treat many types of cancer like lymphoma, myeloma, leukemia, breast cancer, and ovarian cancer, to name a few. This medicine  may be used for other purposes; ask your health care provider or pharmacist if you have questions. COMMON BRAND NAME(S): Cytoxan, Neosar What should I tell my care team before I take this medication? They need to know if you have any of these conditions: heart disease history of irregular heartbeat infection kidney disease liver disease low blood counts, like white cells, platelets, or red blood cells on hemodialysis recent or ongoing radiation therapy scarring or thickening of the lungs trouble passing urine an unusual or allergic reaction to cyclophosphamide, other medicines, foods, dyes, or preservatives pregnant or trying to get pregnant breast-feeding How should I use this medication? This drug is usually given as an injection into a vein or muscle or by infusion into a vein. It is administered in a hospital or clinic by a specially trained health care professional. Talk to your pediatrician regarding the use of this medicine in  children. Special care may be needed. Overdosage: If you think you have taken too much of this medicine contact a poison control center or emergency room at once. NOTE: This medicine is only for you. Do not share this medicine with others. What if I miss a dose? It is important not to miss your dose. Call your doctor or health care professional if you are unable to keep an appointment. What may interact with this medication? amphotericin B azathioprine certain antivirals for HIV or hepatitis certain medicines for blood pressure, heart disease, irregular heart beat certain medicines that treat or prevent blood clots like warfarin certain other medicines for cancer cyclosporine etanercept indomethacin medicines that relax muscles for surgery medicines to increase blood counts metronidazole This list may not describe all possible interactions. Give your health care provider a list of all the medicines, herbs, non-prescription drugs, or dietary supplements you use. Also tell them if you smoke, drink alcohol, or use illegal drugs. Some items may interact with your medicine. What should I watch for while using this medication? Your condition will be monitored carefully while you are receiving this medicine. You may need blood work done while you are taking this medicine. Drink water or other fluids as directed. Urinate often, even at night. Some products may contain alcohol. Ask your health care professional if this medicine contains alcohol. Be sure to tell all health care professionals you are taking this medicine. Certain medicines, like metronidazole and disulfiram, can cause an unpleasant reaction when taken with alcohol. The reaction includes flushing, headache, nausea, vomiting, sweating, and increased thirst. The reaction can last from 30 minutes to several hours. Do not become pregnant while taking this medicine or for 1 year after stopping it. Women should inform their health care  professional if they wish to become pregnant or think they might be pregnant. Men should not father a child while taking this medicine and for 4 months after stopping it. There is potential for serious side effects to an unborn child. Talk to your health care professional for more information. Do not breast-feed an infant while taking this medicine or for 1 week after stopping it. This medicine has caused ovarian failure in some women. This medicine may make it more difficult to get pregnant. Talk to your health care professional if you are concerned about your fertility. This medicine has caused decreased sperm counts in some men. This may make it more difficult to father a child. Talk to your health care professional if you are concerned about your fertility. Call your health care professional for advice if you get a  fever, chills, or sore throat, or other symptoms of a cold or flu. Do not treat yourself. This medicine decreases your body's ability to fight infections. Try to avoid being around people who are sick. Avoid taking medicines that contain aspirin, acetaminophen, ibuprofen, naproxen, or ketoprofen unless instructed by your health care professional. These medicines may hide a fever. Talk to your health care professional about your risk of cancer. You may be more at risk for certain types of cancer if you take this medicine. If you are going to need surgery or other procedure, tell your health care professional that you are using this medicine. Be careful brushing or flossing your teeth or using a toothpick because you may get an infection or bleed more easily. If you have any dental work done, tell your dentist you are receiving this medicine. What side effects may I notice from receiving this medication? Side effects that you should report to your doctor or health care professional as soon as possible: allergic reactions like skin rash, itching or hives, swelling of the face, lips, or  tongue breathing problems nausea, vomiting signs and symptoms of bleeding such as bloody or black, tarry stools; red or dark brown urine; spitting up blood or brown material that looks like coffee grounds; red spots on the skin; unusual bruising or bleeding from the eyes, gums, or nose signs and symptoms of heart failure like fast, irregular heartbeat, sudden weight gain; swelling of the ankles, feet, hands signs and symptoms of infection like fever; chills; cough; sore throat; pain or trouble passing urine signs and symptoms of kidney injury like trouble passing urine or change in the amount of urine signs and symptoms of liver injury like dark yellow or brown urine; general ill feeling or flu-like symptoms; light-colored stools; loss of appetite; nausea; right upper belly pain; unusually weak or tired; yellowing of the eyes or skin Side effects that usually do not require medical attention (report to your doctor or health care professional if they continue or are bothersome): confusion decreased hearing diarrhea facial flushing hair loss headache loss of appetite missed menstrual periods signs and symptoms of low red blood cells or anemia such as unusually weak or tired; feeling faint or lightheaded; falls skin discoloration This list may not describe all possible side effects. Call your doctor for medical advice about side effects. You may report side effects to FDA at 1-800-FDA-1088. Where should I keep my medication? This drug is given in a hospital or clinic and will not be stored at home. NOTE: This sheet is a summary. It may not cover all possible information. If you have questions about this medicine, talk to your doctor, pharmacist, or health care provider.  20 Rituximab Injection What is this medication? RITUXIMAB (ri TUX i mab) is a monoclonal antibody. It is used to treat certain types of cancer like non-Hodgkin lymphoma and chronic lymphocytic leukemia. It is also used to  treat rheumatoid arthritis, granulomatosis with polyangiitis, microscopic polyangiitis, and pemphigus vulgaris. This medicine may be used for other purposes; ask your health care provider or pharmacist if you have questions. COMMON BRAND NAME(S): RIABNI, Rituxan, RUXIENCE What should I tell my care team before I take this medication? They need to know if you have any of these conditions: chest pain heart disease infection especially a viral infection such as chickenpox, cold sores, hepatitis B, or herpes immune system problems irregular heartbeat or rhythm kidney disease low blood counts (white cells, platelets, or red cells) lung disease recent or  upcoming vaccine an unusual or allergic reaction to rituximab, other medicines, foods, dyes, or preservatives pregnant or trying to get pregnant breast-feeding How should I use this medication? This medicine is injected into a vein. It is given by a health care provider in a hospital or clinic setting. A special MedGuide will be given to you before each treatment. Be sure to read this information carefully each time. Talk to your health care provider about the use of this medicine in children. While this drug may be prescribed for children as young as 6 months for selected conditions, precautions do apply. Overdosage: If you think you have taken too much of this medicine contact a poison control center or emergency room at once. NOTE: This medicine is only for you. Do not share this medicine with others. What if I miss a dose? Keep appointments for follow-up doses. It is important not to miss your dose. Call your health care provider if you are unable to keep an appointment. What may interact with this medication? Do not take this medicine with any of the following medicines: live vaccines This medicine may also interact with the following medicines: cisplatin This list may not describe all possible interactions. Give your health care  provider a list of all the medicines, herbs, non-prescription drugs, or dietary supplements you use. Also tell them if you smoke, drink alcohol, or use illegal drugs. Some items may interact with your medicine. What should I watch for while using this medication? Your condition will be monitored carefully while you are receiving this medicine. You may need blood work done while you are taking this medicine. This medicine can cause serious infusion reactions. To reduce the risk your health care provider may give you other medicines to take before receiving this one. Be sure to follow the directions from your health care provider. This medicine may increase your risk of getting an infection. Call your health care provider for advice if you get a fever, chills, sore throat, or other symptoms of a cold or flu. Do not treat yourself. Try to avoid being around people who are sick. Call your health care provider if you are around anyone with measles, chickenpox, or if you develop sores or blisters that do not heal properly. Avoid taking medicines that contain aspirin, acetaminophen, ibuprofen, naproxen, or ketoprofen unless instructed by your health care provider. These medicines may hide a fever. This medicine may cause serious skin reactions. They can happen weeks to months after starting the medicine. Contact your health care provider right away if you notice fevers or flu-like symptoms with a rash. The rash may be red or purple and then turn into blisters or peeling of the skin. Or, you might notice a red rash with swelling of the face, lips or lymph nodes in your neck or under your arms. In some patients, this medicine may cause a serious brain infection that may cause death. If you have any problems seeing, thinking, speaking, walking, or standing, tell your healthcare professional right away. If you cannot reach your healthcare professional, urgently seek other source of medical care. Do not become pregnant  while taking this medicine or for at least 12 months after stopping it. Women should inform their health care provider if they wish to become pregnant or think they might be pregnant. There is potential for serious harm to an unborn child. Talk to your health care provider for more information. Women should use a reliable form of birth control while taking this medicine  and for 12 months after stopping it. Do not breast-feed while taking this medicine or for at least 6 months after stopping it. What side effects may I notice from receiving this medication? Side effects that you should report to your health care provider as soon as possible: allergic reactions (skin rash, itching or hives; swelling of the face, lips, or tongue) diarrhea edema (sudden weight gain; swelling of the ankles, feet, hands or other unusual swelling; trouble breathing) fast, irregular heartbeat heart attack (trouble breathing; pain or tightness in the chest, neck, back or arms; unusually weak or tired) infection (fever, chills, cough, sore throat, pain or trouble passing urine) kidney injury (trouble passing urine or change in the amount of urine) liver injury (dark yellow or brown urine; general ill feeling or flu-like symptoms; loss of appetite, right upper belly pain; unusually weak or tired, yellowing of the eyes or skin) low blood pressure (dizziness; feeling faint or lightheaded, falls; unusually weak or tired) low red blood cell counts (trouble breathing; feeling faint; lightheaded, falls; unusually weak or tired) mouth sores redness, blistering, peeling, or loosening of the skin, including inside the mouth stomach pain unusual bruising or bleeding wheezing (trouble breathing with loud or whistling sounds) vomiting Side effects that usually do not require medical attention (report to your health care provider if they continue or are bothersome): headache joint pain muscle cramps, pain nausea This list may not  describe all possible side effects. Call your doctor for medical advice about side effects. You may report side effects to FDA at 1-800-FDA-1088. Where should I keep my medication? This medicine is given in a hospital or clinic. It will not be stored at home. NOTE: This sheet is a summary. It may not cover all possible information. If you have questions about this medicine, talk to your doctor, pharmacist, or health care provider.  2022 [employer] (2020-09-30 00:00:00)

## 2021-10-11 ENCOUNTER — Other Ambulatory Visit: Payer: Self-pay

## 2021-10-11 ENCOUNTER — Inpatient Hospital Stay: Payer: 59

## 2021-10-11 VITALS — BP 152/81 | HR 91 | Temp 98.2°F | Resp 18

## 2021-10-11 DIAGNOSIS — C8330 Diffuse large B-cell lymphoma, unspecified site: Secondary | ICD-10-CM

## 2021-10-11 DIAGNOSIS — Z7189 Other specified counseling: Secondary | ICD-10-CM

## 2021-10-11 DIAGNOSIS — C8512 Unspecified B-cell lymphoma, intrathoracic lymph nodes: Secondary | ICD-10-CM | POA: Diagnosis not present

## 2021-10-11 MED ORDER — PEGFILGRASTIM-BMEZ 6 MG/0.6ML ~~LOC~~ SOSY
6.0000 mg | PREFILLED_SYRINGE | Freq: Once | SUBCUTANEOUS | Status: AC
  Administered 2021-10-11: 6 mg via SUBCUTANEOUS
  Filled 2021-10-11: qty 0.6

## 2021-10-11 NOTE — Patient Instructions (Signed)

## 2021-10-13 ENCOUNTER — Other Ambulatory Visit: Payer: Self-pay | Admitting: Hematology

## 2021-10-17 ENCOUNTER — Other Ambulatory Visit: Payer: Self-pay | Admitting: Hematology

## 2021-10-29 ENCOUNTER — Inpatient Hospital Stay (HOSPITAL_BASED_OUTPATIENT_CLINIC_OR_DEPARTMENT_OTHER): Payer: 59 | Admitting: Hematology

## 2021-10-29 ENCOUNTER — Inpatient Hospital Stay: Payer: 59 | Attending: Hematology

## 2021-10-29 ENCOUNTER — Other Ambulatory Visit: Payer: Self-pay

## 2021-10-29 ENCOUNTER — Inpatient Hospital Stay: Payer: 59

## 2021-10-29 VITALS — BP 140/78 | HR 96 | Temp 97.9°F | Resp 18

## 2021-10-29 VITALS — BP 162/86 | HR 97 | Temp 97.5°F | Resp 18 | Wt 249.9 lb

## 2021-10-29 DIAGNOSIS — Z5111 Encounter for antineoplastic chemotherapy: Secondary | ICD-10-CM | POA: Insufficient documentation

## 2021-10-29 DIAGNOSIS — Z7189 Other specified counseling: Secondary | ICD-10-CM

## 2021-10-29 DIAGNOSIS — C8512 Unspecified B-cell lymphoma, intrathoracic lymph nodes: Secondary | ICD-10-CM | POA: Diagnosis not present

## 2021-10-29 DIAGNOSIS — C8330 Diffuse large B-cell lymphoma, unspecified site: Secondary | ICD-10-CM

## 2021-10-29 DIAGNOSIS — Z5189 Encounter for other specified aftercare: Secondary | ICD-10-CM | POA: Diagnosis not present

## 2021-10-29 DIAGNOSIS — Z95828 Presence of other vascular implants and grafts: Secondary | ICD-10-CM

## 2021-10-29 LAB — CBC WITH DIFFERENTIAL (CANCER CENTER ONLY)
Abs Immature Granulocytes: 0.05 10*3/uL (ref 0.00–0.07)
Basophils Absolute: 0 10*3/uL (ref 0.0–0.1)
Basophils Relative: 1 %
Eosinophils Absolute: 0 10*3/uL (ref 0.0–0.5)
Eosinophils Relative: 1 %
HCT: 32.6 % — ABNORMAL LOW (ref 36.0–46.0)
Hemoglobin: 11 g/dL — ABNORMAL LOW (ref 12.0–15.0)
Immature Granulocytes: 1 %
Lymphocytes Relative: 17 %
Lymphs Abs: 1 10*3/uL (ref 0.7–4.0)
MCH: 31.2 pg (ref 26.0–34.0)
MCHC: 33.7 g/dL (ref 30.0–36.0)
MCV: 92.4 fL (ref 80.0–100.0)
Monocytes Absolute: 0.5 10*3/uL (ref 0.1–1.0)
Monocytes Relative: 9 %
Neutro Abs: 4 10*3/uL (ref 1.7–7.7)
Neutrophils Relative %: 71 %
Platelet Count: 177 10*3/uL (ref 150–400)
RBC: 3.53 MIL/uL — ABNORMAL LOW (ref 3.87–5.11)
RDW: 15.1 % (ref 11.5–15.5)
WBC Count: 5.6 10*3/uL (ref 4.0–10.5)
nRBC: 0.4 % — ABNORMAL HIGH (ref 0.0–0.2)

## 2021-10-29 LAB — CMP (CANCER CENTER ONLY)
ALT: 26 U/L (ref 0–44)
AST: 22 U/L (ref 15–41)
Albumin: 4 g/dL (ref 3.5–5.0)
Alkaline Phosphatase: 80 U/L (ref 38–126)
Anion gap: 7 (ref 5–15)
BUN: 15 mg/dL (ref 6–20)
CO2: 29 mmol/L (ref 22–32)
Calcium: 9.5 mg/dL (ref 8.9–10.3)
Chloride: 105 mmol/L (ref 98–111)
Creatinine: 0.66 mg/dL (ref 0.44–1.00)
GFR, Estimated: >60 mL/min (ref >60)
Glucose, Bld: 134 mg/dL — ABNORMAL HIGH (ref 70–99)
Potassium: 3.6 mmol/L (ref 3.5–5.1)
Sodium: 141 mmol/L (ref 135–145)
Total Bilirubin: 0.3 mg/dL (ref 0.3–1.2)
Total Protein: 6.4 g/dL — ABNORMAL LOW (ref 6.5–8.1)

## 2021-10-29 LAB — LACTATE DEHYDROGENASE: LDH: 231 U/L — ABNORMAL HIGH (ref 98–192)

## 2021-10-29 MED ORDER — SODIUM CHLORIDE 0.9 % IV SOLN
150.0000 mg | Freq: Once | INTRAVENOUS | Status: AC
  Administered 2021-10-29: 150 mg via INTRAVENOUS
  Filled 2021-10-29: qty 150

## 2021-10-29 MED ORDER — FAMOTIDINE 20 MG IN NS 100 ML IVPB
20.0000 mg | Freq: Once | INTRAVENOUS | Status: AC
  Administered 2021-10-29: 20 mg via INTRAVENOUS
  Filled 2021-10-29: qty 100

## 2021-10-29 MED ORDER — DOXORUBICIN HCL CHEMO IV INJECTION 2 MG/ML
50.0000 mg/m2 | Freq: Once | INTRAVENOUS | Status: AC
  Administered 2021-10-29: 112 mg via INTRAVENOUS
  Filled 2021-10-29: qty 56

## 2021-10-29 MED ORDER — HEPARIN SOD (PORK) LOCK FLUSH 100 UNIT/ML IV SOLN
500.0000 [IU] | Freq: Once | INTRAVENOUS | Status: AC | PRN
  Administered 2021-10-29: 500 [IU]

## 2021-10-29 MED ORDER — VINCRISTINE SULFATE CHEMO INJECTION 1 MG/ML
2.0000 mg | Freq: Once | INTRAVENOUS | Status: AC
  Administered 2021-10-29: 2 mg via INTRAVENOUS
  Filled 2021-10-29: qty 2

## 2021-10-29 MED ORDER — SODIUM CHLORIDE 0.9 % IV SOLN
10.0000 mg | Freq: Once | INTRAVENOUS | Status: AC
  Administered 2021-10-29: 10 mg via INTRAVENOUS
  Filled 2021-10-29: qty 10

## 2021-10-29 MED ORDER — SODIUM CHLORIDE 0.9 % IV SOLN: Freq: Once | INTRAVENOUS | Status: AC

## 2021-10-29 MED ORDER — SODIUM CHLORIDE 0.9% FLUSH
10.0000 mL | INTRAVENOUS | Status: DC | PRN
  Administered 2021-10-29: 10 mL

## 2021-10-29 MED ORDER — SODIUM CHLORIDE 0.9 % IV SOLN
750.0000 mg/m2 | Freq: Once | INTRAVENOUS | Status: AC
  Administered 2021-10-29: 1660 mg via INTRAVENOUS
  Filled 2021-10-29: qty 83

## 2021-10-29 MED ORDER — ACETAMINOPHEN 325 MG PO TABS
650.0000 mg | ORAL_TABLET | Freq: Once | ORAL | Status: AC
  Administered 2021-10-29: 650 mg via ORAL
  Filled 2021-10-29: qty 2

## 2021-10-29 MED ORDER — SODIUM CHLORIDE 0.9% FLUSH
10.0000 mL | Freq: Once | INTRAVENOUS | Status: AC
  Administered 2021-10-29: 10 mL

## 2021-10-29 MED ORDER — DIPHENHYDRAMINE HCL 25 MG PO CAPS
50.0000 mg | ORAL_CAPSULE | Freq: Once | ORAL | Status: AC
  Administered 2021-10-29: 50 mg via ORAL
  Filled 2021-10-29: qty 2

## 2021-10-29 MED ORDER — SODIUM CHLORIDE 0.9 % IV SOLN
375.0000 mg/m2 | Freq: Once | INTRAVENOUS | Status: AC
  Administered 2021-10-29: 800 mg via INTRAVENOUS
  Filled 2021-10-29: qty 50

## 2021-10-29 MED ORDER — PALONOSETRON HCL INJECTION 0.25 MG/5ML
0.2500 mg | Freq: Once | INTRAVENOUS | Status: AC
  Administered 2021-10-29: 0.25 mg via INTRAVENOUS
  Filled 2021-10-29: qty 5

## 2021-10-29 NOTE — Progress Notes (Signed)
VO from MD [name] to change to [employer]

## 2021-10-30 ENCOUNTER — Encounter: Payer: Self-pay | Admitting: Hematology

## 2021-10-30 ENCOUNTER — Telehealth: Payer: Self-pay | Admitting: Hematology

## 2021-10-30 NOTE — Telephone Encounter (Signed)
Scheduled follow-up appointments per 1/18 los. Patient is aware. °

## 2021-10-31 ENCOUNTER — Inpatient Hospital Stay: Payer: 59

## 2021-10-31 ENCOUNTER — Other Ambulatory Visit: Payer: Self-pay

## 2021-10-31 VITALS — BP 146/81 | HR 81 | Temp 98.5°F | Resp 16

## 2021-10-31 DIAGNOSIS — Z7189 Other specified counseling: Secondary | ICD-10-CM

## 2021-10-31 DIAGNOSIS — C8512 Unspecified B-cell lymphoma, intrathoracic lymph nodes: Secondary | ICD-10-CM | POA: Diagnosis not present

## 2021-10-31 DIAGNOSIS — C8330 Diffuse large B-cell lymphoma, unspecified site: Secondary | ICD-10-CM

## 2021-10-31 MED ORDER — PEGFILGRASTIM-BMEZ 6 MG/0.6ML ~~LOC~~ SOSY
6.0000 mg | PREFILLED_SYRINGE | Freq: Once | SUBCUTANEOUS | Status: AC
  Administered 2021-10-31: 6 mg via SUBCUTANEOUS
  Filled 2021-10-31: qty 0.6

## 2021-11-04 ENCOUNTER — Encounter: Payer: Self-pay | Admitting: Hematology

## 2021-11-04 NOTE — Progress Notes (Addendum)
[name]   HEMATOLOGY/ONCOLOGY CLINIC NOTE  Date of Service: .10/29/2021   Patient Care Team: Pleas Koch, NP as PCP - General (Internal Medicine) Kristeen Miss, MD as Consulting Physician (Neurosurgery) Rolm Bookbinder, MD as Consulting Physician (General Surgery)  CHIEF COMPLAINTS/PURPOSE OF CONSULTATION:  Follow-up prior to planned cycle 3 of R-CHOP chemotherapy  HISTORY OF PRESENTING ILLNESS:   Virginia Oneal is a wonderful 56 y.o. female who has been referred to [name] by Dr Kristeen Miss MD for evaluation and management of newly diagnosed high grade B cell lymphoma.  Patient has a history of hypertension, seasonal allergies, gestational diabetes, depression/anxiety, COVID-19 infection in January 2022, kidney stones presented with gross hematuria for 3 days in February 2022.  She was evaluated by her primary care provider and had a CT abdomen and pelvis on 11/20/2020 which showed small nonobstructive calculus in the right kidney.  No suspicious urinary tract mass.  She was noted to have new sclerosis of the T12 vertebral body concerning for possible osseous metastatic disease.  There was no evidence of primary malignancy in the abdomen or pelvis. Patient subsequently had a bone scan on 12/23/2020 which showed 1. Intense activity within the posterior aspect of the T12 vertebralbody which extends in symmetric fashion through the LEFT and RIGHT pedicle. This is associated by sclerosis on the CT portion of the exam. The symmetric nature of the lesion a could indicate trauma. Recommend clinical correlation. Cannot exclude malignancy at this level. 2. No evidence of malignant lesions elsewhere on the whole-body scan.  Patient reports that she was referred to Dr. Kristeen Miss for further evaluation of her T12 lesion and had a MRI of the thoracic spine with and without contrast on 02/20/2021 which showed Heterogeneous Decreased T1/T2 signal in the T12 vertebral body associated with sclerosis of that  vertebra on CT which is new since 2020. There is a small enhancing extraosseous component mildly projecting into the ventral epidural space which appears 1 or 2 mm larger since February (series 10, image 7), but no dural involvement or spinal stenosis. And there is a second smaller but similar lesion in the posterior right T7 body, also more conspicuous since February.  There is a chronic and seemingly unrelated 4 cm T5-T6 left paraspinal cystic mass, stable since 2016.  Patient had a T12 vertebral body biopsy by Dr. Ellene Route on 04/07/2021 which was indeterminate and showed no overt malignancy.  Patient had a follow-up MRI of the thoracic spine on 07/04/2021 which showed 1. Mild interval progression of the T12 lesion, especially on the right where there is ventral epidural soft tissue which is non-compressive. Unchanged infiltrative lesion in the right T7 body and pedicle.  Patient had a repeat open transpedicular biopsy of T12 vertebra with Dr. Ellene Route on 07/31/2021 which showed high-grade non-Hodgkin's B-cell lymphoma with germinal center phenotype Ki-67 of 50 to 60%. Prior study did not show c-Myc rearrangement.  Patient was referred to medical oncology for further evaluation and management.  INTERVAL HISTORY  .Virginia Oneal is here with her husband for follow-up prior to her third cycle of R-CHOP chemotherapy. She notes no acute new symptoms.  No significant tingling or numbness in her hands or feet.  She noted it transiently but has resolved over time after the second cycle of treatment. No fevers no chills no night sweats. No issues with infection. No significant new back pain or lower extremity weakness or changes in bowel or bladder habits. Minimal constipation that resolved.  Labs today CBC shows  hemoglobin of 11, WBC count of 5.6k with an ANC of 4000 and platelets of 177k. CMP within normal limits LDH 231  MEDICAL HISTORY:  Past Medical History:  Diagnosis Date   Abnormal uterine  bleeding    Anemia    as a teenager   Anxiety    postpartum and hx panic attacks   Arrhythmia    Arthritis    back and hands   Chest mass    by CT scanning   Chest pain 16/07/9603   Complication of anesthesia    slow to wake up   COVID-19 10/2020   Depression    DOE (dyspnea on exertion) 07/26/2014   Family history of adverse reaction to anesthesia    mom slow to wake up   Gestational diabetes    resolved after pregnancy   History of kidney stones    Hypertension    Personal history of colonic polyps    PONV (postoperative nausea and vomiting)    PREMATURE ATRIAL CONTRACTIONS 08/15/2008   Qualifier: Diagnosis of  By: Council Mechanic MD, Hilaria Ota    RENAL CALCULUS, HX OF 08/15/2008   Qualifier: Diagnosis of  By: Zara Council LPN, Wanda     Rotator cuff tendinitis 06/29/2011   Seasonal allergies    Urinary incontinence 05/2009   hx bladder sling--Dr. Quincy Simmonds    SURGICAL HISTORY: Past Surgical History:  Procedure Laterality Date   ABDOMINAL HYSTERECTOMY  05/2009   TAH/LSO   BLADDER SUSPENSION  2010   Dr. Quincy Simmonds   BREAST BIOPSY     left stereo   CESAREAN SECTION  03/2003   CESAREAN SECTION  2004   CHOLECYSTECTOMY N/A 01/02/2021   Procedure: LAPAROSCOPIC CHOLECYSTECTOMY;  Surgeon: Rolm Bookbinder, MD;  Location: [facility];  Service: General;  Laterality: N/A;  58  - GEN AND TAP BLOCK LDOW   COLONOSCOPY  09/08   COLPORRHAPHY  05/2009   Dr. Quincy Simmonds   ENDOMETRIAL ABLATION  2006   Dr. Quincy Simmonds   INCONTINENCE SURGERY     sling for urinary incontinence   IR IMAGING GUIDED PORT INSERTION  09/01/2021   KYPHOPLASTY N/A 04/07/2021   Procedure: Thoracic 12 Vertebral body biopsy;  Surgeon: Kristeen Miss, MD;  Location: [facility];  Service: Neurosurgery;  Laterality: N/A;   LAPAROSCOPIC ENDOMETRIOSIS FULGURATION     ablation   LUMBAR LAMINECTOMY/DECOMPRESSION MICRODISCECTOMY N/A 07/31/2021   Procedure: Open Transpedicular biopsy of Thoracic twelve;  Surgeon: Kristeen Miss, MD;  Location: [facility];   Service: Neurosurgery;  Laterality: N/A;   OOPHORECTOMY  2010   w/hyst--Dr. Quincy Simmonds   TUBAL LIGATION  2004   w/ C-section Dr. Quincy Simmonds   TYMPANOPLASTY     right ear   VAGINAL HYSTERECTOMY  05/21/2009   lap assisted /partial    SOCIAL HISTORY: Social History   Socioeconomic History   Marital status: Married    Spouse name: Not on file   Number of children: 2   Years of education: Not on file   Highest education level: Not on file  Occupational History   Occupation: [employer]  Tobacco Use   Smoking status: Former    Years: 10.00    Types: Cigarettes    Quit date: 01/06/1999    Years since quitting: 22.8   Smokeless tobacco: Never  Vaping Use   Vaping Use: Never used  Substance and Sexual Activity   Alcohol use: No    Alcohol/week: 0.0 standard drinks   Drug use: No   Sexual activity: Yes    Partners:  Male    Birth control/protection: Other-see comments, Surgical    Comment: TAH/LSO  Other Topics Concern   Not on file  Social History Narrative   ** Merged History Encounter **       Social Determinants of Health   Financial Resource Strain: Not on file  Food Insecurity: Not on file  Transportation Needs: Not on file  Physical Activity: Not on file  Stress: Not on file  Social Connections: Not on file  Intimate Partner Violence: Not on file    FAMILY HISTORY: Family History  Problem Relation Age of Onset   Kidney disease Mother        renal failure   Stroke Mother    Diabetes Mother    Hypertension Mother    Heart failure Mother    Colon cancer Mother    Stroke Father    Cancer - Colon Maternal Aunt    Hypertension Sister    Rheum arthritis Sister    Hypertension Brother    Hypertension Sister    Hypertension Sister    Hypertension Sister    Breast cancer Sister    Kidney cancer Sister    Esophageal cancer Neg Hx    Rectal cancer Neg Hx    Stomach cancer Neg Hx     ALLERGIES:  is allergic to buspar [buspirone hcl].  MEDICATIONS:   Current Outpatient Medications  Medication Sig Dispense Refill   acetaminophen (TYLENOL) 500 MG tablet Take 1,000 mg by mouth every 6 (six) hours as needed for moderate pain or headache.     b complex vitamins capsule Take 1 capsule by mouth daily.     betamethasone valerate ointment (VALISONE) 0.1 % Apply 1 application topically 2 (two) times daily. (Patient taking differently: Apply 1 application topically 2 (two) times daily as needed (rash).) 45 g 1   Biotin 10000 MCG TABS Take 10,000 mcg by mouth daily.     Black Cohosh 40 MG CAPS Take 40 mg by mouth daily.     cetirizine (ZYRTEC) 10 MG tablet Take 10 mg by mouth daily.      Cholecalciferol (VITAMIN D3) 125 MCG (5000 UT) CAPS Take 5,000 Units by mouth daily.     COVID-19 mRNA bivalent vaccine, Moderna, (MODERNA COVID-19 BIVAL BOOSTER) 50 MCG/0.5ML injection Inject into the muscle. 0.5 mL 0   hydrochlorothiazide (HYDRODIURIL) 25 MG tablet Take 1 tablet (25 mg total) by mouth daily. For blood pressure. 90 tablet 3   HYDROcodone-acetaminophen (NORCO) 5-325 MG tablet Take 1-2 tablets by mouth every 6 (six) hours as needed for moderate pain or severe pain. 10 tablet 0   hydrOXYzine (ATARAX/VISTARIL) 10 MG tablet Take 1 or 2 tablets by mouth as needed for anxiety attack. 30 tablet 0   influenza vac split quadrivalent PF (FLUARIX) 0.5 ML injection Inject into the muscle. 0.5 mL 0   ketorolac (TORADOL) 10 MG tablet Take 1 tablet (10 mg total) by mouth every 6 (six) hours as needed for moderate pain or severe pain. 20 tablet 0   KLOR-CON M20 20 MEQ tablet TAKE 1 TABLET BY MOUTH TWICE A DAY 30 tablet 0   lidocaine-prilocaine (EMLA) cream Apply to affected area once 30 g 3   LORazepam (ATIVAN) 0.5 MG tablet Take 1 tablet (0.5 mg total) by mouth every 12 (twelve) hours as needed for anxiety. 30 tablet 1   [place] 500 MG CAPS Take 500 mg by mouth daily.     Methylcobalamin (B-12) 5000 MCG TBDP Take 5,000 mcg by mouth  daily.     metoprolol succinate  (TOPROL-XL) 25 MG 24 hr tablet TAKE 1 TABLET (25 MG TOTAL) BY MOUTH DAILY. 30 tablet 1   omeprazole (PRILOSEC) 40 MG capsule TAKE 1 CAPSULE (40 MG TOTAL) BY MOUTH DAILY. 90 capsule 1   ondansetron (ZOFRAN) 8 MG tablet Take 1 tablet (8 mg total) by mouth 2 (two) times daily as needed for refractory nausea / vomiting. Start on day 3 after cyclophosphamide chemotherapy. 30 tablet 1   predniSONE (DELTASONE) 20 MG tablet Take 3 tablets (60 mg total) by mouth daily. Take with food on days 1-5 of chemotherapy. 15 tablet 5   Prenatal Vit-Fe Fumarate-FA (PRENATAL MULTIVITAMIN) TABS tablet Take 1 tablet by mouth daily.     prochlorperazine (COMPAZINE) 10 MG tablet Take 1 tablet (10 mg total) by mouth every 6 (six) hours as needed (Nausea or vomiting). 30 tablet 6   Turmeric 500 MG CAPS Take 500 mg by mouth daily.     venlafaxine XR (EFFEXOR-XR) 37.5 MG 24 hr capsule TAKE 1 CAPSULE (37.5 MG TOTAL) BY MOUTH DAILY WITH BREAKFAST. FOR HOT FLASHES AND ANXIETY 90 capsule 1   No current facility-administered medications for this visit.    REVIEW OF SYSTEMS:   .10 Point review of Systems was done is negative except as noted above.   PHYSICAL EXAMINATION: ECOG PERFORMANCE STATUS: 1 - Symptomatic but completely ambulatory  . Vitals:   10/29/21 1007  BP: (!) 162/86  Pulse: 97  Resp: 18  Temp: (!) 97.5 F (36.4 C)  SpO2: 98%    Filed Weights   10/29/21 1007  Weight: 249 lb 14.4 oz (113.4 kg)    .Body mass index is 42.9 kg/m. [name] GENERAL:alert, in no acute distress and comfortable SKIN: no acute rashes, no significant lesions EYES: conjunctiva are pink and non-injected, sclera anicteric OROPHARYNX: MMM, no exudates, no oropharyngeal erythema or ulceration NECK: supple, no JVD LYMPH:  no palpable lymphadenopathy in the cervical, axillary or inguinal regions LUNGS: clear to auscultation b/l with normal respiratory effort HEART: regular rate & rhythm ABDOMEN:  normoactive bowel sounds , non tender,  not distended. Extremity: no pedal edema PSYCH: alert & oriented x 3 with fluent speech NEURO: no focal motor/sensory deficits  LABORATORY DATA:  I have reviewed the data as listed  . CBC Latest Ref Rng & Units 10/29/2021 10/09/2021 09/25/2021  WBC 4.0 - 10.5 K/uL 5.6 6.3 1.6(L)  Hemoglobin 12.0 - 15.0 g/dL 11.0(L) 12.0 13.2  Hematocrit 36.0 - 46.0 % 32.6(L) 35.3(L) 38.2  Platelets 150 - 400 K/uL 177 246 98(L)   . CMP Latest Ref Rng & Units 10/29/2021 10/09/2021 09/25/2021  Glucose 70 - 99 mg/dL 134(H) 77 158(H)  BUN 6 - 20 mg/dL 15 17 23(H)  Creatinine 0.44 - 1.00 mg/dL 0.66 0.75 0.78  Sodium 135 - 145 mmol/L 141 141 139  Potassium 3.5 - 5.1 mmol/L 3.6 3.7 3.1(L)  Chloride 98 - 111 mmol/L 105 105 101  CO2 22 - 32 mmol/L _0 Calcium 8.9 - 10.3 mg/dL 9.5 9.2 9.3  Total Protein 6.5 - 8.1 g/dL 6.4(L) 6.6 6.6  Total Bilirubin 0.3 - 1.2 mg/dL 0.3 0.3 0.7  Alkaline Phos 38 - 126 U/L 80 81 111  AST 15 - 41 U/L 22 29 11(L)  ALT 0 - 44 U/L _1 Lab Results  Component Value Date   LDH 231 (H) 10/29/2021    HIV Screen 4th Generation wRfx     Non Reactive  Non Reactive  Hepatitis B Surface Ag     NON REACTIVE NON REACTIVE  Hep B Core Total Ab     NON REACTIVE NON REACTIVE  HCV Ab     NON REACTIVE NON REACTIVE  LDH     98 - 192 U/L 228 (H)    SURGICAL PATHOLOGY  CASE: [id number]  PATIENT: Adja Alyea  Surgical Pathology Report   Clinical History: lesion of thoracic vertebra (cm)   FINAL MICROSCOPIC DIAGNOSIS:   A-C. BONE, T12 VERTEBRAL BODY AND MARROW, BIOPSY:  -  Non-Hodgkin B-cell lymphoma, high grade  -  See comment   COMMENT:   A-C.  The biopsies show normal hematopoeisis admixed with crush artifact  with associated marrow fibrosis and an atypical cellular infiltrate  composed of medium to large cells with irregular nuclear contours, ample  cytoplasm, variably prominent nucleoli, increased mitoses and apoptosis.  An initial panel of  immunohistochemistry (cytokeratin AE1/3, cytokeratin  7, cytokeratin 20, CD20 and CD3) confirmed the morphologic impression  that the atypical cells were lymphoid in nature.  The atypical  lymphocytes are positive for Pax-5, CD20, CD10, bcl-6 but negative for  CD30, bcl-2, mum-1 and EBV by in-situ hybridization. CD3 and CD5  highlight background T-cells.  The proliferative rate by ki-67 is  increased (50-60%).  Overall, the findings are consistent with a  non-Hodgkin B-cell lymphoma, high grade with a germinal center  phenotype.   FISH studies are pending and will be reported in an  addendum.  These findings were discussed with Dr. Ellene Route on August 06, 2021.    RADIOGRAPHIC STUDIES: I have personally reviewed the radiological images as listed and agreed with the findings in the report.  ECHO 09/08/2021 1.Left ventricular ejection fraction, by estimation, is 55 to 60%. The left ventricle has normal function. The left ventricle has no regional wall motion abnormalities. There ismild left ventricular hypertrophy. Left ventricular diastolic parameters were normal.The average left ventricular global longitudinal strain is -20.4 %. The global longitudinal strain is normal. 2. Right ventricular systolic function is normal. The right ventricular size is normal. 3. Left atrial size was mildly dilated. The mitral valve is normal in structure. Trivial mitral valve regurgitation. No evidence of mitral stenosis. Moderate mitral annular calcification. 4.The aortic valve is normal in structure. Aortic valve regurgitation is not visualized. No aortic stenosis is present. 5.The inferior vena cava is normal in size with greater than 50% respiratory variability, suggesting right atrial pressure of 3 mmHg.  NM PET Image Restag (PS) Skull Base To Thigh  Result Date: 11/13/2021 CLINICAL DATA:  Subsequent treatment strategy for diffuse large B-cell lymphoma, assess treatment response in a 56 year old female.  EXAM: NUCLEAR MEDICINE PET SKULL BASE TO THIGH TECHNIQUE: 12.3 mCi F-18 FDG was injected intravenously. Full-ring PET imaging was performed from the skull base to thigh after the radiotracer. CT data was obtained and used for attenuation correction and anatomic localization. Fasting blood glucose: 117 mg/dl COMPARISON:  September 02, 2021. FINDINGS: Mediastinal blood pool activity: SUV max 2.92 Liver activity: SUV max 4.16 NECK: No hypermetabolic lymph nodes in the neck. Incidental CT findings: none CHEST: No hypermetabolic mediastinal or hilar nodes. No suspicious pulmonary nodules on the CT scan. Incidental CT findings: RIGHT-sided Port-A-Cath in situ terminates at the caval to atrial junction. Heart size normal without pericardial effusion. Scattered aortic atherosclerosis. Lungs are clear. Airways are patent. Small soft tissue density along the LEFT paraspinous region (image 77/4) 13 mm markedly diminished in size compared to studies from  2016 and unchanged from the recent PET comparison shows no signs of increased metabolic activity. ABDOMEN/PELVIS: No abnormal hypermetabolic activity within the liver, pancreas, adrenal glands, or spleen. No hypermetabolic lymph nodes in the abdomen or pelvis. Lymph nodes with increased metabolic activity have diminished in size and increased metabolism no signs of uptake equal to or greater than mediastinal blood pool (image 125/4) very small periportal lymph nodes 6 mm previously 8 mm with a maximum SUV of 2.7 previously maximum SUV of 3.0 Small lymph node at the root of the small bowel mesentery measuring 2-3 mm short axis previously 4-5 mm now with a maximum SUV of 1.7 as compared to 3.4. No focal solid organ uptake. Splenic uptake less than liver. No splenic enlargement. Incidental CT findings: Hepatic steatosis suspect worsening of steatosis since the prior study. Lobular hepatic cyst along the anterior LEFT hemi liver is unchanged. Post cholecystectomy. No pancreatic  inflammation. Spleen, adrenal glands and kidneys without acute process. Nephrolithiasis in the lower pole of the LEFT kidney with a 7 mm calculus is unchanged. Urinary bladder is decompressed. No acute gastrointestinal process.  Post hysterectomy. SKELETON: Tracks within the bilateral T12 pedicles crossing into the vertebral body likely related to prior biopsy, associated with increased metabolic activity at D22 on the prior study. Maximum SUV at T12 on the RIGHT on the previous exam 8.0 now 3.4 Generalized heterogeneous marrow uptake both in the spine and in the pelvis Increased uptake in the LEFT lateral aspect of T12 with a maximum SUV of 4.2 Incidental CT findings: none IMPRESSION: Interval improvement with respect to size and metabolism associated with abdominal lymph nodes seen on the prior study. Diminished metabolism in general in the T12 vertebral body potentially associated with prior biopsy. Heterogeneous appearance of the marrow but with generalized increase in marrow uptake, potentially related to marrow stimulation in the current context. MRI of the spine may be helpful for further evaluation given prior spinal involvement. Hepatic steatosis, suspect interval worsening. Nephrolithiasis. Aortic atherosclerosis. Electronically Signed   By: Zetta Bills M.D.   On: 11/13/2021 14:15    ASSESSMENT & PLAN:   56 year old female with  1) Stage IVA high-grade large B cell non-Hodgkin's lymphoma.  Germinal center phenotype.  C-Myc not rearranged.  Ki-67 50 to 60% No overt constitutional symptoms PET/CT with involvement of thoracic spine and acromion process on the right.  Abdominal lymphadenopathy. PLAN Labs done on 08/20/2021 were reviewed in detail CBC within normal limits, CMP unremarkable, LDH slightly elevated at 228. Hepatitis testing unremarkable HIV nonreactive Myeloma panel shows no M spike protein spike.  Echo cardiogram shows normal ejection fraction  PET CT scan done on 09/02/2021  shows Scattered mild hypermetabolism within subcentimeter periportal, retroperitoneal and mesenteric lymph nodes, compatible with the given history of large B-cell lymphoma. 2. Hypermetabolism within known T7 and T12 lesions, better seen on MR thoracic spine 07/04/2021. 3. Focal hypermetabolism in the acromion of the right scapula, without a definite CT correlate. Recommend attention on follow-up.  2) Chemotherapy related neutropenia -now resolved.  No issues with infections.    3) G-CSF related significant bone pain. PLAN -Labs done today reviewed with the patient CBC CMP stable. -No symptoms suggestive of lymphoma progression at this time. -No significant notable toxicities from her second cycle of R-CHOP chemotherapy. -Patient is appropriate to proceed with cycle 3 of R-CHOP with the same supportive medications. -She has as needed Norco available for G-CSF related bone pains. -We shall plan to get a repeat PET CT scan  prior to her fourth cycle of R-CHOP to evaluate for interim treatment response.  3) Patient with palpitations with mild persistent tachycardia with heart rates up to 120s to 130s on her apple watch. Patient notes that she has not had any palpitations since her last clinic visit. She noted that her heart rates have improved and are in the 80-100 range and she opted to not start the Toprol XL. PLAN -Patient notes no further palpitations or tachycardia.  She has not had to use her beta-blockers. -Recommended continue monitoring, stress relief, good hydration with at least 2 L of fluids daily.  Follow-up PET/CT in 2 weeks Please schedule cycle 4 and cycle 5 of R-CHOP as ordered. Port flush labs and MD visits on cycle 4-day 1 and cycle 5-day 1  All of the patients questions were answered with apparent satisfaction. The patient knows to call the clinic with any problems, questions or concerns.   . The total time spent in the appointment was 30 minutes for chart review, review  of labs with the patient, toxicity check, ordering and management of chemoimmunotherapy, setting of follow-ups including PET scan and documentation.  Sullivan Lone MD MS AAHIVMS [facility] Hematology/Oncology Physician [facility]  .[name]

## 2021-11-12 ENCOUNTER — Other Ambulatory Visit: Payer: Self-pay

## 2021-11-12 ENCOUNTER — Ambulatory Visit (HOSPITAL_COMMUNITY)
Admission: RE | Admit: 2021-11-12 | Discharge: 2021-11-12 | Disposition: A | Discharge status: Home / Self Care | Payer: 59 | Source: Ambulatory Visit | Attending: Hematology | Admitting: Hematology

## 2021-11-12 DIAGNOSIS — C8330 Diffuse large B-cell lymphoma, unspecified site: Secondary | ICD-10-CM | POA: Diagnosis present

## 2021-11-12 LAB — GLUCOSE, CAPILLARY: Glucose-Capillary: 117 mg/dL — ABNORMAL HIGH (ref 70–99)

## 2021-11-12 MED ORDER — FLUDEOXYGLUCOSE F - 18 (FDG) INJECTION
12.4000 | Freq: Once | INTRAVENOUS | Status: AC | PRN
  Administered 2021-11-12: 12.31 via INTRAVENOUS

## 2021-11-14 ENCOUNTER — Other Ambulatory Visit: Payer: Self-pay | Admitting: Hematology

## 2021-11-19 ENCOUNTER — Inpatient Hospital Stay (HOSPITAL_BASED_OUTPATIENT_CLINIC_OR_DEPARTMENT_OTHER): Payer: 59 | Admitting: Hematology

## 2021-11-19 ENCOUNTER — Inpatient Hospital Stay: Payer: 59

## 2021-11-19 ENCOUNTER — Other Ambulatory Visit: Payer: Self-pay

## 2021-11-19 ENCOUNTER — Inpatient Hospital Stay: Payer: 59 | Attending: Hematology

## 2021-11-19 VITALS — BP 135/72 | HR 92 | Temp 98.2°F | Resp 18 | Ht 64.0 in | Wt 251.0 lb

## 2021-11-19 DIAGNOSIS — C8512 Unspecified B-cell lymphoma, intrathoracic lymph nodes: Secondary | ICD-10-CM | POA: Diagnosis not present

## 2021-11-19 DIAGNOSIS — Z95828 Presence of other vascular implants and grafts: Secondary | ICD-10-CM

## 2021-11-19 DIAGNOSIS — C8330 Diffuse large B-cell lymphoma, unspecified site: Secondary | ICD-10-CM | POA: Diagnosis not present

## 2021-11-19 DIAGNOSIS — Z7189 Other specified counseling: Secondary | ICD-10-CM

## 2021-11-19 DIAGNOSIS — Z5189 Encounter for other specified aftercare: Secondary | ICD-10-CM | POA: Diagnosis not present

## 2021-11-19 DIAGNOSIS — Z5111 Encounter for antineoplastic chemotherapy: Secondary | ICD-10-CM | POA: Insufficient documentation

## 2021-11-19 LAB — CBC WITH DIFFERENTIAL (CANCER CENTER ONLY)
Abs Immature Granulocytes: 0.04 10*3/uL (ref 0.00–0.07)
Basophils Absolute: 0 10*3/uL (ref 0.0–0.1)
Basophils Relative: 1 %
Eosinophils Absolute: 0 10*3/uL (ref 0.0–0.5)
Eosinophils Relative: 0 %
HCT: 32 % — ABNORMAL LOW (ref 36.0–46.0)
Hemoglobin: 10.8 g/dL — ABNORMAL LOW (ref 12.0–15.0)
Immature Granulocytes: 1 %
Lymphocytes Relative: 11 %
Lymphs Abs: 0.5 10*3/uL — ABNORMAL LOW (ref 0.7–4.0)
MCH: 31.9 pg (ref 26.0–34.0)
MCHC: 33.8 g/dL (ref 30.0–36.0)
MCV: 94.4 fL (ref 80.0–100.0)
Monocytes Absolute: 0.1 10*3/uL (ref 0.1–1.0)
Monocytes Relative: 2 %
Neutro Abs: 3.5 10*3/uL (ref 1.7–7.7)
Neutrophils Relative %: 85 %
Platelet Count: 243 10*3/uL (ref 150–400)
RBC: 3.39 MIL/uL — ABNORMAL LOW (ref 3.87–5.11)
RDW: 17.3 % — ABNORMAL HIGH (ref 11.5–15.5)
WBC Count: 4.1 10*3/uL (ref 4.0–10.5)
nRBC: 0 % (ref 0.0–0.2)

## 2021-11-19 LAB — CMP (CANCER CENTER ONLY)
ALT: 27 U/L (ref 0–44)
AST: 20 U/L (ref 15–41)
Albumin: 4.2 g/dL (ref 3.5–5.0)
Alkaline Phosphatase: 81 U/L (ref 38–126)
Anion gap: 8 (ref 5–15)
BUN: 18 mg/dL (ref 6–20)
CO2: 28 mmol/L (ref 22–32)
Calcium: 9.4 mg/dL (ref 8.9–10.3)
Chloride: 103 mmol/L (ref 98–111)
Creatinine: 0.7 mg/dL (ref 0.44–1.00)
GFR, Estimated: >60 mL/min (ref >60)
Glucose, Bld: 189 mg/dL — ABNORMAL HIGH (ref 70–99)
Potassium: 3.8 mmol/L (ref 3.5–5.1)
Sodium: 139 mmol/L (ref 135–145)
Total Bilirubin: 0.4 mg/dL (ref 0.3–1.2)
Total Protein: 6.7 g/dL (ref 6.5–8.1)

## 2021-11-19 LAB — LACTATE DEHYDROGENASE: LDH: 233 U/L — ABNORMAL HIGH (ref 98–192)

## 2021-11-19 MED ORDER — SODIUM CHLORIDE 0.9% FLUSH
10.0000 mL | INTRAVENOUS | Status: DC | PRN
  Administered 2021-11-19: 10 mL

## 2021-11-19 MED ORDER — SODIUM CHLORIDE 0.9 % IV SOLN: Freq: Once | INTRAVENOUS | Status: AC

## 2021-11-19 MED ORDER — SODIUM CHLORIDE 0.9 % IV SOLN
10.0000 mg | Freq: Once | INTRAVENOUS | Status: AC
  Administered 2021-11-19: 10 mg via INTRAVENOUS
  Filled 2021-11-19: qty 10

## 2021-11-19 MED ORDER — DOXORUBICIN HCL CHEMO IV INJECTION 2 MG/ML
50.0000 mg/m2 | Freq: Once | INTRAVENOUS | Status: AC
  Administered 2021-11-19: 112 mg via INTRAVENOUS
  Filled 2021-11-19: qty 56

## 2021-11-19 MED ORDER — SODIUM CHLORIDE 0.9 % IV SOLN
750.0000 mg/m2 | Freq: Once | INTRAVENOUS | Status: AC
  Administered 2021-11-19: 1660 mg via INTRAVENOUS
  Filled 2021-11-19: qty 83

## 2021-11-19 MED ORDER — HEPARIN SOD (PORK) LOCK FLUSH 100 UNIT/ML IV SOLN
500.0000 [IU] | Freq: Once | INTRAVENOUS | Status: AC | PRN
  Administered 2021-11-19: 500 [IU]

## 2021-11-19 MED ORDER — VINCRISTINE SULFATE CHEMO INJECTION 1 MG/ML
2.0000 mg | Freq: Once | INTRAVENOUS | Status: AC
  Administered 2021-11-19: 2 mg via INTRAVENOUS
  Filled 2021-11-19: qty 2

## 2021-11-19 MED ORDER — SODIUM CHLORIDE 0.9 % IV SOLN
150.0000 mg | Freq: Once | INTRAVENOUS | Status: AC
  Administered 2021-11-19: 150 mg via INTRAVENOUS
  Filled 2021-11-19: qty 150

## 2021-11-19 MED ORDER — SODIUM CHLORIDE 0.9 % IV SOLN
375.0000 mg/m2 | Freq: Once | INTRAVENOUS | Status: AC
  Administered 2021-11-19: 800 mg via INTRAVENOUS
  Filled 2021-11-19: qty 50

## 2021-11-19 MED ORDER — PALONOSETRON HCL INJECTION 0.25 MG/5ML
0.2500 mg | Freq: Once | INTRAVENOUS | Status: AC
  Administered 2021-11-19: 0.25 mg via INTRAVENOUS
  Filled 2021-11-19: qty 5

## 2021-11-19 MED ORDER — ACETAMINOPHEN 325 MG PO TABS
650.0000 mg | ORAL_TABLET | Freq: Once | ORAL | Status: AC
  Administered 2021-11-19: 650 mg via ORAL
  Filled 2021-11-19: qty 2

## 2021-11-19 MED ORDER — DIPHENHYDRAMINE HCL 25 MG PO CAPS
50.0000 mg | ORAL_CAPSULE | Freq: Once | ORAL | Status: AC
  Administered 2021-11-19: 50 mg via ORAL
  Filled 2021-11-19: qty 2

## 2021-11-19 MED ORDER — FAMOTIDINE IN NACL 20-0.9 MG/50ML-% IV SOLN
20.0000 mg | Freq: Once | INTRAVENOUS | Status: AC
  Administered 2021-11-19: 20 mg via INTRAVENOUS
  Filled 2021-11-19: qty 50

## 2021-11-19 MED ORDER — SODIUM CHLORIDE 0.9% FLUSH
10.0000 mL | Freq: Once | INTRAVENOUS | Status: AC
  Administered 2021-11-19: 10 mL

## 2021-11-19 NOTE — Progress Notes (Addendum)
[name]   HEMATOLOGY/ONCOLOGY CLINIC NOTE  Date of Service: .11/19/2021   Patient Care Team: Pleas Koch, NP as PCP - General (Internal Medicine) Kristeen Miss, MD as Consulting Physician (Neurosurgery) Rolm Bookbinder, MD as Consulting Physician (General Surgery)  CHIEF COMPLAINTS/PURPOSE OF CONSULTATION:  Follow-up prior to planned cycle 4 of R-CHOP chemotherapy  HISTORY OF PRESENTING ILLNESS:   Virginia Oneal is a wonderful 56 y.o. female who has been referred to [name] by Dr Kristeen Miss MD for evaluation and management of newly diagnosed high grade B cell lymphoma.  Patient has a history of hypertension, seasonal allergies, gestational diabetes, depression/anxiety, COVID-19 infection in January 2022, kidney stones presented with gross hematuria for 3 days in February 2022.  She was evaluated by her primary care provider and had a CT abdomen and pelvis on 11/20/2020 which showed small nonobstructive calculus in the right kidney.  No suspicious urinary tract mass.  She was noted to have new sclerosis of the T12 vertebral body concerning for possible osseous metastatic disease.  There was no evidence of primary malignancy in the abdomen or pelvis. Patient subsequently had a bone scan on 12/23/2020 which showed 1. Intense activity within the posterior aspect of the T12 vertebralbody which extends in symmetric fashion through the LEFT and RIGHT pedicle. This is associated by sclerosis on the CT portion of the exam. The symmetric nature of the lesion a could indicate trauma. Recommend clinical correlation. Cannot exclude malignancy at this level. 2. No evidence of malignant lesions elsewhere on the whole-body scan.  Patient reports that she was referred to Dr. Kristeen Miss for further evaluation of her T12 lesion and had a MRI of the thoracic spine with and without contrast on 02/20/2021 which showed Heterogeneous Decreased T1/T2 signal in the T12 vertebral body associated with sclerosis of that  vertebra on CT which is new since 2020. There is a small enhancing extraosseous component mildly projecting into the ventral epidural space which appears 1 or 2 mm larger since February (series 10, image 7), but no dural involvement or spinal stenosis. And there is a second smaller but similar lesion in the posterior right T7 body, also more conspicuous since February.  There is a chronic and seemingly unrelated 4 cm T5-T6 left paraspinal cystic mass, stable since 2016.  Patient had a T12 vertebral body biopsy by Dr. Ellene Route on 04/07/2021 which was indeterminate and showed no overt malignancy.  Patient had a follow-up MRI of the thoracic spine on 07/04/2021 which showed 1. Mild interval progression of the T12 lesion, especially on the right where there is ventral epidural soft tissue which is non-compressive. Unchanged infiltrative lesion in the right T7 body and pedicle.  Patient had a repeat open transpedicular biopsy of T12 vertebra with Dr. Ellene Route on 07/31/2021 which showed high-grade non-Hodgkin's B-cell lymphoma with germinal center phenotype Ki-67 of 50 to 60%. Prior study did not show c-Myc rearrangement.  Patient was referred to medical oncology for further evaluation and management.  INTERVAL HISTORY  .Virginia Oneal is here with her husband prior to her cycle 4 of R-CHOP chemoimmunotherapy. She notes no acute issues since her last clinic visit. No fevers no chills no night sweats no unexpected weight loss.  PET CT scan was done on 11/12/2021 and showed Interval improvement with respect to size and metabolism associated with abdominal lymph nodes seen on the prior study.  Diminished metabolism in general in the T12 vertebral body potentially associated with prior biopsy.   Heterogeneous appearance of the marrow  but with generalized increase in marrow uptake, potentially related to marrow stimulation in the current context. MRI of the spine may be helpful for further evaluation given prior  spinal involvement. Hepatic steatosis, suspect interval worsening.   Labs done today show CBC with a hemoglobin of 10.8 with normal WBC count of 4.1k and normal platelets of 243k LDH 233 CMP within normal limits  MEDICAL HISTORY:  Past Medical History:  Diagnosis Date   Abnormal uterine bleeding    Anemia    as a teenager   Anxiety    postpartum and hx panic attacks   Arrhythmia    Arthritis    back and hands   Chest mass    by CT scanning   Chest pain 50/93/2671   Complication of anesthesia    slow to wake up   COVID-19 10/2020   Depression    DOE (dyspnea on exertion) 07/26/2014   Family history of adverse reaction to anesthesia    mom slow to wake up   Gestational diabetes    resolved after pregnancy   History of kidney stones    Hypertension    Personal history of colonic polyps    PONV (postoperative nausea and vomiting)    PREMATURE ATRIAL CONTRACTIONS 08/15/2008   Qualifier: Diagnosis of  By: Council Mechanic MD, Hilaria Ota    RENAL CALCULUS, HX OF 08/15/2008   Qualifier: Diagnosis of  By: Zara Council LPN, Wanda     Rotator cuff tendinitis 06/29/2011   Seasonal allergies    Urinary incontinence 05/2009   hx bladder sling--Dr. Quincy Simmonds    SURGICAL HISTORY: Past Surgical History:  Procedure Laterality Date   ABDOMINAL HYSTERECTOMY  05/2009   TAH/LSO   BLADDER SUSPENSION  2010   Dr. Quincy Simmonds   BREAST BIOPSY     left stereo   CESAREAN SECTION  03/2003   CESAREAN SECTION  2004   CHOLECYSTECTOMY N/A 01/02/2021   Procedure: LAPAROSCOPIC CHOLECYSTECTOMY;  Surgeon: Rolm Bookbinder, MD;  Location: [facility];  Service: General;  Laterality: N/A;  58  - GEN AND TAP BLOCK LDOW   COLONOSCOPY  09/08   COLPORRHAPHY  05/2009   Dr. Quincy Simmonds   ENDOMETRIAL ABLATION  2006   Dr. Quincy Simmonds   INCONTINENCE SURGERY     sling for urinary incontinence   IR IMAGING GUIDED PORT INSERTION  09/01/2021   KYPHOPLASTY N/A 04/07/2021   Procedure: Thoracic 12 Vertebral body biopsy;  Surgeon: Kristeen Miss, MD;   Location: [facility];  Service: Neurosurgery;  Laterality: N/A;   LAPAROSCOPIC ENDOMETRIOSIS FULGURATION     ablation   LUMBAR LAMINECTOMY/DECOMPRESSION MICRODISCECTOMY N/A 07/31/2021   Procedure: Open Transpedicular biopsy of Thoracic twelve;  Surgeon: Kristeen Miss, MD;  Location: [facility];  Service: Neurosurgery;  Laterality: N/A;   OOPHORECTOMY  2010   w/hyst--Dr. Quincy Simmonds   TUBAL LIGATION  2004   w/ C-section Dr. Quincy Simmonds   TYMPANOPLASTY     right ear   VAGINAL HYSTERECTOMY  05/21/2009   lap assisted /partial    SOCIAL HISTORY: Social History   Socioeconomic History   Marital status: Married    Spouse name: Not on file   Number of children: 2   Years of education: Not on file   Highest education level: Not on file  Occupational History   Occupation: [employer]  Tobacco Use   Smoking status: Former    Years: 10.00    Types: Cigarettes    Quit date: 01/06/1999    Years since quitting: 22.9   Smokeless  tobacco: Never  Vaping Use   Vaping Use: Never used  Substance and Sexual Activity   Alcohol use: No    Alcohol/week: 0.0 standard drinks   Drug use: No   Sexual activity: Yes    Partners: Male    Birth control/protection: Other-see comments, Surgical    Comment: TAH/LSO  Other Topics Concern   Not on file  Social History Narrative   ** Merged History Encounter **       Social Determinants of Health   Financial Resource Strain: Not on file  Food Insecurity: Not on file  Transportation Needs: Not on file  Physical Activity: Not on file  Stress: Not on file  Social Connections: Not on file  Intimate Partner Violence: Not on file    FAMILY HISTORY: Family History  Problem Relation Age of Onset   Kidney disease Mother        renal failure   Stroke Mother    Diabetes Mother    Hypertension Mother    Heart failure Mother    Colon cancer Mother    Stroke Father    Cancer - Colon Maternal Aunt    Hypertension Sister    Rheum arthritis Sister    Hypertension  Brother    Hypertension Sister    Hypertension Sister    Hypertension Sister    Breast cancer Sister    Kidney cancer Sister    Esophageal cancer Neg Hx    Rectal cancer Neg Hx    Stomach cancer Neg Hx     ALLERGIES:  is allergic to buspar [buspirone hcl].  MEDICATIONS:  Current Outpatient Medications  Medication Sig Dispense Refill   acetaminophen (TYLENOL) 500 MG tablet Take 1,000 mg by mouth every 6 (six) hours as needed for moderate pain or headache.     b complex vitamins capsule Take 1 capsule by mouth daily.     betamethasone valerate ointment (VALISONE) 0.1 % Apply 1 application topically 2 (two) times daily. (Patient taking differently: Apply 1 application topically 2 (two) times daily as needed (rash).) 45 g 1   Biotin 10000 MCG TABS Take 10,000 mcg by mouth daily.     Black Cohosh 40 MG CAPS Take 40 mg by mouth daily.     cetirizine (ZYRTEC) 10 MG tablet Take 10 mg by mouth daily.      Cholecalciferol (VITAMIN D3) 125 MCG (5000 UT) CAPS Take 5,000 Units by mouth daily.     COVID-19 mRNA bivalent vaccine, Moderna, (MODERNA COVID-19 BIVAL BOOSTER) 50 MCG/0.5ML injection Inject into the muscle. 0.5 mL 0   hydrochlorothiazide (HYDRODIURIL) 25 MG tablet Take 1 tablet (25 mg total) by mouth daily. For blood pressure. 90 tablet 3   HYDROcodone-acetaminophen (NORCO) 5-325 MG tablet Take 1-2 tablets by mouth every 6 (six) hours as needed for moderate pain or severe pain. 10 tablet 0   hydrOXYzine (ATARAX/VISTARIL) 10 MG tablet Take 1 or 2 tablets by mouth as needed for anxiety attack. 30 tablet 0   influenza vac split quadrivalent PF (FLUARIX) 0.5 ML injection Inject into the muscle. 0.5 mL 0   ketorolac (TORADOL) 10 MG tablet Take 1 tablet (10 mg total) by mouth every 6 (six) hours as needed for moderate pain or severe pain. 20 tablet 0   KLOR-CON M20 20 MEQ tablet TAKE 1 TABLET BY MOUTH TWICE A DAY 30 tablet 0   lidocaine-prilocaine (EMLA) cream Apply to affected area once 30 g 3    LORazepam (ATIVAN) 0.5 MG tablet Take 1  tablet (0.5 mg total) by mouth every 12 (twelve) hours as needed for anxiety. 30 tablet 1   [place] 500 MG CAPS Take 500 mg by mouth daily.     Methylcobalamin (B-12) 5000 MCG TBDP Take 5,000 mcg by mouth daily.     metoprolol succinate (TOPROL-XL) 25 MG 24 hr tablet TAKE 1 TABLET (25 MG TOTAL) BY MOUTH DAILY. 30 tablet 1   omeprazole (PRILOSEC) 40 MG capsule TAKE 1 CAPSULE (40 MG TOTAL) BY MOUTH DAILY. 90 capsule 1   ondansetron (ZOFRAN) 8 MG tablet Take 1 tablet (8 mg total) by mouth 2 (two) times daily as needed for refractory nausea / vomiting. Start on day 3 after cyclophosphamide chemotherapy. 30 tablet 1   predniSONE (DELTASONE) 20 MG tablet Take 3 tablets (60 mg total) by mouth daily. Take with food on days 1-5 of chemotherapy. 15 tablet 5   Prenatal Vit-Fe Fumarate-FA (PRENATAL MULTIVITAMIN) TABS tablet Take 1 tablet by mouth daily.     prochlorperazine (COMPAZINE) 10 MG tablet Take 1 tablet (10 mg total) by mouth every 6 (six) hours as needed (Nausea or vomiting). 30 tablet 6   Turmeric 500 MG CAPS Take 500 mg by mouth daily.     venlafaxine XR (EFFEXOR-XR) 37.5 MG 24 hr capsule TAKE 1 CAPSULE (37.5 MG TOTAL) BY MOUTH DAILY WITH BREAKFAST. FOR HOT FLASHES AND ANXIETY 90 capsule 1   No current facility-administered medications for this visit.    REVIEW OF SYSTEMS:   10 Point review of Systems was done is negative except as noted above.   PHYSICAL EXAMINATION: ECOG PERFORMANCE STATUS: 1 - Symptomatic but completely ambulatory Vital signs reviewed NAD GENERAL:alert, in no acute distress and comfortable SKIN: no acute rashes, no significant lesions EYES: conjunctiva are pink and non-injected, sclera anicteric OROPHARYNX: MMM, no exudates, no oropharyngeal erythema or ulceration NECK: supple, no JVD LYMPH:  no palpable lymphadenopathy in the cervical, axillary or inguinal regions LUNGS: clear to auscultation b/l with normal respiratory  effort HEART: regular rate & rhythm ABDOMEN:  normoactive bowel sounds , non tender, not distended. Extremity: no pedal edema PSYCH: alert & oriented x 3 with fluent speech NEURO: no focal motor/sensory deficits   LABORATORY DATA:  I have reviewed the data as listed  . CBC Latest Ref Rng & Units 11/19/2021 10/29/2021 10/09/2021  WBC 4.0 - 10.5 K/uL 4.1 5.6 6.3  Hemoglobin 12.0 - 15.0 g/dL 10.8(L) 11.0(L) 12.0  Hematocrit 36.0 - 46.0 % 32.0(L) 32.6(L) 35.3(L)  Platelets 150 - 400 K/uL 243 177 246   . CMP Latest Ref Rng & Units 11/19/2021 10/29/2021 10/09/2021  Glucose 70 - 99 mg/dL 189(H) 134(H) 77  BUN 6 - 20 mg/dL 18 15 17   Creatinine 0.44 - 1.00 mg/dL 0.70 0.66 0.75  Sodium 135 - 145 mmol/L 139 141 141  Potassium 3.5 - 5.1 mmol/L 3.8 3.6 3.7  Chloride 98 - 111 mmol/L 103 105 105  CO2 22 - 32 mmol/L 28 29 29   Calcium 8.9 - 10.3 mg/dL 9.4 9.5 9.2  Total Protein 6.5 - 8.1 g/dL 6.7 6.4(L) 6.6  Total Bilirubin 0.3 - 1.2 mg/dL 0.4 0.3 0.3  Alkaline Phos 38 - 126 U/L 81 80 81  AST 15 - 41 U/L 20 22 29   ALT 0 - 44 U/L 27 26 30    Lab Results  Component Value Date   LDH 233 (H) 11/19/2021    HIV Screen 4th Generation wRfx     Non Reactive Non Reactive  Hepatitis B Surface Ag  NON REACTIVE NON REACTIVE  Hep B Core Total Ab     NON REACTIVE NON REACTIVE  HCV Ab     NON REACTIVE NON REACTIVE  LDH     98 - 192 U/L 228 (H)    SURGICAL PATHOLOGY  CASE: [id number]  PATIENT: Dori Rauh  Surgical Pathology Report   Clinical History: lesion of thoracic vertebra (cm)   FINAL MICROSCOPIC DIAGNOSIS:   A-C. BONE, T12 VERTEBRAL BODY AND MARROW, BIOPSY:  -  Non-Hodgkin B-cell lymphoma, high grade  -  See comment   COMMENT:   A-C.  The biopsies show normal hematopoeisis admixed with crush artifact  with associated marrow fibrosis and an atypical cellular infiltrate  composed of medium to large cells with irregular nuclear contours, ample  cytoplasm, variably prominent  nucleoli, increased mitoses and apoptosis.  An initial panel of immunohistochemistry (cytokeratin AE1/3, cytokeratin  7, cytokeratin 20, CD20 and CD3) confirmed the morphologic impression  that the atypical cells were lymphoid in nature.  The atypical  lymphocytes are positive for Pax-5, CD20, CD10, bcl-6 but negative for  CD30, bcl-2, mum-1 and EBV by in-situ hybridization. CD3 and CD5  highlight background T-cells.  The proliferative rate by ki-67 is  increased (50-60%).  Overall, the findings are consistent with a  non-Hodgkin B-cell lymphoma, high grade with a germinal center  phenotype.   FISH studies are pending and will be reported in an  addendum.  These findings were discussed with Dr. Ellene Route on August 06, 2021.    RADIOGRAPHIC STUDIES: I have personally reviewed the radiological images as listed and agreed with the findings in the report.  ECHO 09/08/2021 1.Left ventricular ejection fraction, by estimation, is 55 to 60%. The left ventricle has normal function. The left ventricle has no regional wall motion abnormalities. There ismild left ventricular hypertrophy. Left ventricular diastolic parameters were normal.The average left ventricular global longitudinal strain is -20.4 %. The global longitudinal strain is normal. 2. Right ventricular systolic function is normal. The right ventricular size is normal. 3. Left atrial size was mildly dilated. The mitral valve is normal in structure. Trivial mitral valve regurgitation. No evidence of mitral stenosis. Moderate mitral annular calcification. 4.The aortic valve is normal in structure. Aortic valve regurgitation is not visualized. No aortic stenosis is present. 5.The inferior vena cava is normal in size with greater than 50% respiratory variability, suggesting right atrial pressure of 3 mmHg.  NM PET Image Restag (PS) Skull Base To Thigh  Result Date: 11/13/2021 CLINICAL DATA:  Subsequent treatment strategy for diffuse large  B-cell lymphoma, assess treatment response in a 56 year old female. EXAM: NUCLEAR MEDICINE PET SKULL BASE TO THIGH TECHNIQUE: 12.3 mCi F-18 FDG was injected intravenously. Full-ring PET imaging was performed from the skull base to thigh after the radiotracer. CT data was obtained and used for attenuation correction and anatomic localization. Fasting blood glucose: 117 mg/dl COMPARISON:  September 02, 2021. FINDINGS: Mediastinal blood pool activity: SUV max 2.92 Liver activity: SUV max 4.16 NECK: No hypermetabolic lymph nodes in the neck. Incidental CT findings: none CHEST: No hypermetabolic mediastinal or hilar nodes. No suspicious pulmonary nodules on the CT scan. Incidental CT findings: RIGHT-sided Port-A-Cath in situ terminates at the caval to atrial junction. Heart size normal without pericardial effusion. Scattered aortic atherosclerosis. Lungs are clear. Airways are patent. Small soft tissue density along the LEFT paraspinous region (image 77/4) 13 mm markedly diminished in size compared to studies from 2016 and unchanged from the recent PET comparison shows no signs  of increased metabolic activity. ABDOMEN/PELVIS: No abnormal hypermetabolic activity within the liver, pancreas, adrenal glands, or spleen. No hypermetabolic lymph nodes in the abdomen or pelvis. Lymph nodes with increased metabolic activity have diminished in size and increased metabolism no signs of uptake equal to or greater than mediastinal blood pool (image 125/4) very small periportal lymph nodes 6 mm previously 8 mm with a maximum SUV of 2.7 previously maximum SUV of 3.0 Small lymph node at the root of the small bowel mesentery measuring 2-3 mm short axis previously 4-5 mm now with a maximum SUV of 1.7 as compared to 3.4. No focal solid organ uptake. Splenic uptake less than liver. No splenic enlargement. Incidental CT findings: Hepatic steatosis suspect worsening of steatosis since the prior study. Lobular hepatic cyst along the anterior  LEFT hemi liver is unchanged. Post cholecystectomy. No pancreatic inflammation. Spleen, adrenal glands and kidneys without acute process. Nephrolithiasis in the lower pole of the LEFT kidney with a 7 mm calculus is unchanged. Urinary bladder is decompressed. No acute gastrointestinal process.  Post hysterectomy. SKELETON: Tracks within the bilateral T12 pedicles crossing into the vertebral body likely related to prior biopsy, associated with increased metabolic activity at W26 on the prior study. Maximum SUV at T12 on the RIGHT on the previous exam 8.0 now 3.4 Generalized heterogeneous marrow uptake both in the spine and in the pelvis Increased uptake in the LEFT lateral aspect of T12 with a maximum SUV of 4.2 Incidental CT findings: none IMPRESSION: Interval improvement with respect to size and metabolism associated with abdominal lymph nodes seen on the prior study. Diminished metabolism in general in the T12 vertebral body potentially associated with prior biopsy. Heterogeneous appearance of the marrow but with generalized increase in marrow uptake, potentially related to marrow stimulation in the current context. MRI of the spine may be helpful for further evaluation given prior spinal involvement. Hepatic steatosis, suspect interval worsening. Nephrolithiasis. Aortic atherosclerosis. Electronically Signed   By: Zetta Bills M.D.   On: 11/13/2021 14:15    ASSESSMENT & PLAN:   56 year old female with  1) Stage IVA high-grade large B cell non-Hodgkin's lymphoma.  Germinal center phenotype.  C-Myc not rearranged.  Ki-67 50 to 60% No overt constitutional symptoms PET/CT with involvement of thoracic spine and acromion process on the right.  Abdominal lymphadenopathy. PLAN Labs done on 08/20/2021 were reviewed in detail CBC within normal limits, CMP unremarkable, LDH slightly elevated at 228. Hepatitis testing unremarkable HIV nonreactive Myeloma panel shows no M spike protein spike.  Echo cardiogram  shows normal ejection fraction  PET CT scan done on 09/02/2021 shows Scattered mild hypermetabolism within subcentimeter periportal, retroperitoneal and mesenteric lymph nodes, compatible with the given history of large B-cell lymphoma. 2. Hypermetabolism within known T7 and T12 lesions, better seen on MR thoracic spine 07/04/2021. 3. Focal hypermetabolism in the acromion of the right scapula, without a definite CT correlate. Recommend attention on follow-up.  2) Chemotherapy related neutropenia -now resolved.  No issues with infections.    3) G-CSF related significant bone pain. PLAN -Discussed with patient 11/19/2021  PET CT scan was done on 11/12/2021 and showed Interval improvement with respect to size and metabolism associated with abdominal lymph nodes seen on the prior study.  Diminished metabolism in general in the T12 vertebral body potentially associated with prior biopsy.   Heterogeneous appearance of the marrow but with generalized increase in marrow uptake, potentially related to marrow stimulation in the current context. MRI of the spine may be helpful  for further evaluation given prior spinal involvement. Hepatic steatosis, suspect interval worsening.   Labs done today show CBC with a hemoglobin of 10.8 with normal WBC count of 4.1k and normal platelets of 243k LDH 233 CMP within normal limits  Patient has no significant toxicities from cycle 3 of R-CHOP and is appropriate to proceed with cycle 4 of R-CHOP today.  No labs or PET scan findings to suggest lymphoma progression.  3) Patient with palpitations with mild persistent tachycardia with heart rates up to 120s to 130s on her apple watch. Patient notes that she has not had any palpitations since her last clinic visit. She noted that her heart rates have improved and are in the 80-100 range and she opted to not start the Toprol XL. PLAN -Patient notes no further palpitations or tachycardia.  She has not had to use her  beta-blockers. -Recommended continue monitoring, stress relief, good hydration with at least 2 L of fluids daily.  Follow-up Please schedule cycle 6 of R-CHOP as ordered. Port flush labs and MD visits on cycle 6-day 1   The total time spent in the appointment was 31 minutes*.  All of the patient's questions were answered with apparent satisfaction. The patient knows to call the clinic with any problems, questions or concerns.   Sullivan Lone MD MS AAHIVMS [facility] Hematology/Oncology Physician [facility]  .*Total Encounter Time as defined by the Centers for Medicare and Medicaid Services includes, in addition to the face-to-face time of a patient visit (documented in the note above) non-face-to-face time: obtaining and reviewing outside history, ordering and reviewing medications, tests or procedures, care coordination (communications with other health care professionals or caregivers) and documentation in the medical record.

## 2021-11-19 NOTE — Patient Instructions (Signed)
[facility]   Discharge Instructions: Thank you for choosing [facility] to provide your oncology and hematology care.   If you have a lab appointment with the [facility], please go directly to the [facility] and check in at the registration area.   Wear comfortable clothing and clothing appropriate for easy access to any Portacath or PICC line.   We strive to give you quality time with your provider. You may need to reschedule your appointment if you arrive late (15 or more minutes).  Arriving late affects you and other patients whose appointments are after yours.  Also, if you miss three or more appointments without notifying the office, you may be dismissed from the clinic at the providers discretion.      For prescription refill requests, have your pharmacy contact our office and allow 72 hours for refills to be completed.    Today you received the following chemotherapy and/or immunotherapy agents Doxorubicin, vincristine, cytoxan, rituximab      To help prevent nausea and vomiting after your treatment, we encourage you to take your nausea medication as directed.  BELOW ARE SYMPTOMS THAT SHOULD BE REPORTED IMMEDIATELY: *FEVER GREATER THAN 100.4 F (38 C) OR HIGHER *CHILLS OR SWEATING *NAUSEA AND VOMITING THAT IS NOT CONTROLLED WITH YOUR NAUSEA MEDICATION *UNUSUAL SHORTNESS OF BREATH *UNUSUAL BRUISING OR BLEEDING *URINARY PROBLEMS (pain or burning when urinating, or frequent urination) *BOWEL PROBLEMS (unusual diarrhea, constipation, pain near the anus) TENDERNESS IN MOUTH AND THROAT WITH OR WITHOUT PRESENCE OF ULCERS (sore throat, sores in mouth, or a toothache) UNUSUAL RASH, SWELLING OR PAIN  UNUSUAL VAGINAL DISCHARGE OR ITCHING   Items with * indicate a potential emergency and should be followed up as soon as possible or go to the Emergency Department if any problems should occur.  Please show the CHEMOTHERAPY ALERT CARD or  IMMUNOTHERAPY ALERT CARD at check-in to the Emergency Department and triage nurse.  Should you have questions after your visit or need to cancel or reschedule your appointment, please contact [facility]  Dept: [phone]  and follow the prompts.  Office hours are 8:00 a.m. to 4:30 p.m. Monday - Friday. Please note that voicemails left after 4:00 p.m. may not be returned until the following business day.  We are closed weekends and major holidays. You have access to a nurse at all times for urgent questions. Please call the main number to the clinic Dept: [phone] and follow the prompts.   For any non-urgent questions, you may also contact your provider using MyChart. We now offer e-Visits for anyone 32 and older to request care online for non-urgent symptoms. For details visit [email or web address].   Also download the MyChart app! Go to the app store, search "MyChart", open the app, select [facility], and log in with your MyChart username and password.  Due to Covid, a mask is required upon entering the hospital/clinic. If you do not have a mask, one will be given to you upon arrival. For doctor visits, patients may have 1 support person aged 34 or older with them. For treatment visits, patients cannot have anyone with them due to current Covid guidelines and our immunocompromised population.     Doxorubicin injection What is this medication? DOXORUBICIN (dox oh ROO bi sin) is a chemotherapy drug. It is used to treat many kinds of cancer like leukemia, lymphoma, neuroblastoma, sarcoma, and Wilms' tumor. It is also used to treat bladder cancer, breast cancer,  lung cancer, ovarian cancer, stomach cancer, and thyroid cancer. This medicine may be used for other purposes; ask your health care provider or pharmacist if you have questions. COMMON BRAND NAME(S): Adriamycin, Adriamycin PFS, Adriamycin RDF, Rubex What should I tell my care team before I take this  medication? They need to know if you have any of these conditions: heart disease history of low blood counts caused by a medicine liver disease recent or ongoing radiation therapy an unusual or allergic reaction to doxorubicin, other chemotherapy agents, other medicines, foods, dyes, or preservatives pregnant or trying to get pregnant breast-feeding How should I use this medication? This drug is given as an infusion into a vein. It is administered in a hospital or clinic by a specially trained health care professional. If you have pain, swelling, burning or any unusual feeling around the site of your injection, tell your health care professional right away. Talk to your pediatrician regarding the use of this medicine in children. Special care may be needed. Overdosage: If you think you have taken too much of this medicine contact a poison control center or emergency room at once. NOTE: This medicine is only for you. Do not share this medicine with others. What if I miss a dose? It is important not to miss your dose. Call your doctor or health care professional if you are unable to keep an appointment. What may interact with this medication? This medicine may interact with the following medications: 6-mercaptopurine paclitaxel phenytoin St. John's Wort trastuzumab verapamil This list may not describe all possible interactions. Give your health care provider a list of all the medicines, herbs, non-prescription drugs, or dietary supplements you use. Also tell them if you smoke, drink alcohol, or use illegal drugs. Some items may interact with your medicine. What should I watch for while using this medication? This drug may make you feel generally unwell. This is not uncommon, as chemotherapy can affect healthy cells as well as cancer cells. Report any side effects. Continue your course of treatment even though you feel ill unless your doctor tells you to stop. There is a maximum amount of  this medicine you should receive throughout your life. The amount depends on the medical condition being treated and your overall health. Your doctor will watch how much of this medicine you receive in your lifetime. Tell your doctor if you have taken this medicine before. You may need blood work done while you are taking this medicine. Your urine may turn red for a few days after your dose. This is not blood. If your urine is dark or brown, call your doctor. In some cases, you may be given additional medicines to help with side effects. Follow all directions for their use. Call your doctor or health care professional for advice if you get a fever, chills or sore throat, or other symptoms of a cold or flu. Do not treat yourself. This drug decreases your body's ability to fight infections. Try to avoid being around people who are sick. This medicine may increase your risk to bruise or bleed. Call your doctor or health care professional if you notice any unusual bleeding. Talk to your doctor about your risk of cancer. You may be more at risk for certain types of cancers if you take this medicine. Do not become pregnant while taking this medicine or for 6 months after stopping it. Women should inform their doctor if they wish to become pregnant or think they might be pregnant. Men should not  father a child while taking this medicine and for 6 months after stopping it. There is a potential for serious side effects to an unborn child. Talk to your health care professional or pharmacist for more information. Do not breast-feed an infant while taking this medicine. This medicine has caused ovarian failure in some women and reduced sperm counts in some men This medicine may interfere with the ability to have a child. Talk with your doctor or health care professional if you are concerned about your fertility. This medicine may cause a decrease in Co-Enzyme Q-10. You should make sure that you get enough Co-Enzyme  Q-10 while you are taking this medicine. Discuss the foods you eat and the vitamins you take with your health care professional. What side effects may I notice from receiving this medication? Side effects that you should report to your doctor or health care professional as soon as possible: allergic reactions like skin rash, itching or hives, swelling of the face, lips, or tongue breathing problems chest pain fast or irregular heartbeat low blood counts - this medicine may decrease the number of white blood cells, red blood cells and platelets. You may be at increased risk for infections and bleeding. pain, redness, or irritation at site where injected signs of infection - fever or chills, cough, sore throat, pain or difficulty passing urine signs of decreased platelets or bleeding - bruising, pinpoint red spots on the skin, black, tarry stools, blood in the urine swelling of the ankles, feet, hands tiredness weakness Side effects that usually do not require medical attention (report to your doctor or health care professional if they continue or are bothersome): diarrhea hair loss mouth sores nail discoloration or damage nausea red colored urine vomiting This list may not describe all possible side effects. Call your doctor for medical advice about side effects. You may report side effects to FDA at 1-800-FDA-1088. Where should I keep my medication? This drug is given in a hospital or clinic and will not be stored at home. NOTE: This sheet is a summary. It may not cover all possible information. If you have questions about this medicine, talk to your doctor, pharmacist, or health care provider.  2022 Elsevier/Gold Standard (2017-06-03 00:00:00)  Vincristine injection What is this medication? VINCRISTINE (vin KRIS teen) is a chemotherapy drug. It slows the growth of cancer cells. This medicine is used to treat many types of cancer like Hodgkin's disease, leukemia, non-Hodgkin's  lymphoma, neuroblastoma (brain cancer), rhabdomyosarcoma, and Wilms' tumor. This medicine may be used for other purposes; ask your health care provider or pharmacist if you have questions. COMMON BRAND NAME(S): Oncovin, Vincasar PFS What should I tell my care team before I take this medication? They need to know if you have any of these conditions: blood disorders gout infection (especially chickenpox, cold sores, or herpes) kidney disease liver disease lung disease nervous system disease like Charcot-Marie-Tooth (CMT) recent or ongoing radiation therapy an unusual or allergic reaction to vincristine, other chemotherapy agents, other medicines, foods, dyes, or preservatives pregnant or trying to get pregnant breast-feeding How should I use this medication? This drug is given as an infusion into a vein. It is administered in a hospital or clinic by a specially trained health care professional. If you have pain, swelling, burning, or any unusual feeling around the site of your injection, tell your health care professional right away. Talk to your pediatrician regarding the use of this medicine in children. While this drug may be prescribed for selected conditions,  precautions do apply. Overdosage: If you think you have taken too much of this medicine contact a poison control center or emergency room at once. NOTE: This medicine is only for you. Do not share this medicine with others. What if I miss a dose? It is important not to miss your dose. Call your doctor or health care professional if you are unable to keep an appointment. What may interact with this medication? certain medicines for fungal infections like itraconazole, ketoconazole, posaconazole, voriconazole certain medicines for seizures like phenytoin This list may not describe all possible interactions. Give your health care provider a list of all the medicines, herbs, non-prescription drugs, or dietary supplements you use. Also  tell them if you smoke, drink alcohol, or use illegal drugs. Some items may interact with your medicine. What should I watch for while using this medication? This drug may make you feel generally unwell. This is not uncommon, as chemotherapy can affect healthy cells as well as cancer cells. Report any side effects. Continue your course of treatment even though you feel ill unless your doctor tells you to stop. You may need blood work done while you are taking this medicine. This medicine will cause constipation. Try to have a bowel movement at least every 2 to 3 days. If you do not have a bowel movement for 3 days, call your doctor or health care professional. In some cases, you may be given additional medicines to help with side effects. Follow all directions for their use. Do not become pregnant while taking this medicine. Women should inform their doctor if they wish to become pregnant or think they might be pregnant. There is a potential for serious side effects to an unborn child. Talk to your health care professional or pharmacist for more information. Do not breast-feed an infant while taking this medicine. This medicine may make it more difficult to get pregnant or to father a child. Talk to your healthcare professional if you are concerned about your fertility. What side effects may I notice from receiving this medication? Side effects that you should report to your doctor or health care professional as soon as possible: allergic reactions like skin rash, itching or hives, swelling of the face, lips, or tongue breathing problems confusion or changes in emotions or moods constipation cough mouth sores muscle weakness nausea and vomiting pain, swelling, redness or irritation at the injection site pain, tingling, numbness in the hands or feet problems with balance, talking, walking seizures stomach pain trouble passing urine or change in the amount of urine Side effects that usually do  not require medical attention (report to your doctor or health care professional if they continue or are bothersome): diarrhea hair loss jaw pain loss of appetite This list may not describe all possible side effects. Call your doctor for medical advice about side effects. You may report side effects to FDA at 1-800-FDA-1088. Where should I keep my medication? This drug is given in a hospital or clinic and will not be stored at home. NOTE: This sheet is a summary. It may not cover all possible information. If you have questions about this medicine, talk to your doctor, pharmacist, or health care provider.  2022 Elsevier/Gold Standard (2021-06-17 00:00:00)  Cyclophosphamide Injection What is this medication? CYCLOPHOSPHAMIDE (sye kloe FOSS fa mide) is a chemotherapy drug. It slows the growth of cancer cells. This medicine is used to treat many types of cancer like lymphoma, myeloma, leukemia, breast cancer, and ovarian cancer, to name a few.  This medicine may be used for other purposes; ask your health care provider or pharmacist if you have questions. COMMON BRAND NAME(S): Cytoxan, Neosar What should I tell my care team before I take this medication? They need to know if you have any of these conditions: heart disease history of irregular heartbeat infection kidney disease liver disease low blood counts, like white cells, platelets, or red blood cells on hemodialysis recent or ongoing radiation therapy scarring or thickening of the lungs trouble passing urine an unusual or allergic reaction to cyclophosphamide, other medicines, foods, dyes, or preservatives pregnant or trying to get pregnant breast-feeding How should I use this medication? This drug is usually given as an injection into a vein or muscle or by infusion into a vein. It is administered in a hospital or clinic by a specially trained health care professional. Talk to your pediatrician regarding the use of this medicine in  children. Special care may be needed. Overdosage: If you think you have taken too much of this medicine contact a poison control center or emergency room at once. NOTE: This medicine is only for you. Do not share this medicine with others. What if I miss a dose? It is important not to miss your dose. Call your doctor or health care professional if you are unable to keep an appointment. What may interact with this medication? amphotericin B azathioprine certain antivirals for HIV or hepatitis certain medicines for blood pressure, heart disease, irregular heart beat certain medicines that treat or prevent blood clots like warfarin certain other medicines for cancer cyclosporine etanercept indomethacin medicines that relax muscles for surgery medicines to increase blood counts metronidazole This list may not describe all possible interactions. Give your health care provider a list of all the medicines, herbs, non-prescription drugs, or dietary supplements you use. Also tell them if you smoke, drink alcohol, or use illegal drugs. Some items may interact with your medicine. What should I watch for while using this medication? Your condition will be monitored carefully while you are receiving this medicine. You may need blood work done while you are taking this medicine. Drink water or other fluids as directed. Urinate often, even at night. Some products may contain alcohol. Ask your health care professional if this medicine contains alcohol. Be sure to tell all health care professionals you are taking this medicine. Certain medicines, like metronidazole and disulfiram, can cause an unpleasant reaction when taken with alcohol. The reaction includes flushing, headache, nausea, vomiting, sweating, and increased thirst. The reaction can last from 30 minutes to several hours. Do not become pregnant while taking this medicine or for 1 year after stopping it. Women should inform their health care  professional if they wish to become pregnant or think they might be pregnant. Men should not father a child while taking this medicine and for 4 months after stopping it. There is potential for serious side effects to an unborn child. Talk to your health care professional for more information. Do not breast-feed an infant while taking this medicine or for 1 week after stopping it. This medicine has caused ovarian failure in some women. This medicine may make it more difficult to get pregnant. Talk to your health care professional if you are concerned about your fertility. This medicine has caused decreased sperm counts in some men. This may make it more difficult to father a child. Talk to your health care professional if you are concerned about your fertility. Call your health care professional for advice if you  get a fever, chills, or sore throat, or other symptoms of a cold or flu. Do not treat yourself. This medicine decreases your body's ability to fight infections. Try to avoid being around people who are sick. Avoid taking medicines that contain aspirin, acetaminophen, ibuprofen, naproxen, or ketoprofen unless instructed by your health care professional. These medicines may hide a fever. Talk to your health care professional about your risk of cancer. You may be more at risk for certain types of cancer if you take this medicine. If you are going to need surgery or other procedure, tell your health care professional that you are using this medicine. Be careful brushing or flossing your teeth or using a toothpick because you may get an infection or bleed more easily. If you have any dental work done, tell your dentist you are receiving this medicine. What side effects may I notice from receiving this medication? Side effects that you should report to your doctor or health care professional as soon as possible: allergic reactions like skin rash, itching or hives, swelling of the face, lips, or  tongue breathing problems nausea, vomiting signs and symptoms of bleeding such as bloody or black, tarry stools; red or dark brown urine; spitting up blood or brown material that looks like coffee grounds; red spots on the skin; unusual bruising or bleeding from the eyes, gums, or nose signs and symptoms of heart failure like fast, irregular heartbeat, sudden weight gain; swelling of the ankles, feet, hands signs and symptoms of infection like fever; chills; cough; sore throat; pain or trouble passing urine signs and symptoms of kidney injury like trouble passing urine or change in the amount of urine signs and symptoms of liver injury like dark yellow or brown urine; general ill feeling or flu-like symptoms; light-colored stools; loss of appetite; nausea; right upper belly pain; unusually weak or tired; yellowing of the eyes or skin Side effects that usually do not require medical attention (report to your doctor or health care professional if they continue or are bothersome): confusion decreased hearing diarrhea facial flushing hair loss headache loss of appetite missed menstrual periods signs and symptoms of low red blood cells or anemia such as unusually weak or tired; feeling faint or lightheaded; falls skin discoloration This list may not describe all possible side effects. Call your doctor for medical advice about side effects. You may report side effects to FDA at 1-800-FDA-1088. Where should I keep my medication? This drug is given in a hospital or clinic and will not be stored at home. NOTE: This sheet is a summary. It may not cover all possible information. If you have questions about this medicine, talk to your doctor, pharmacist, or health care provider.  20 Rituximab Injection What is this medication? RITUXIMAB (ri TUX i mab) is a monoclonal antibody. It is used to treat certain types of cancer like non-Hodgkin lymphoma and chronic lymphocytic leukemia. It is also used to  treat rheumatoid arthritis, granulomatosis with polyangiitis, microscopic polyangiitis, and pemphigus vulgaris. This medicine may be used for other purposes; ask your health care provider or pharmacist if you have questions. COMMON BRAND NAME(S): RIABNI, Rituxan, RUXIENCE What should I tell my care team before I take this medication? They need to know if you have any of these conditions: chest pain heart disease infection especially a viral infection such as chickenpox, cold sores, hepatitis B, or herpes immune system problems irregular heartbeat or rhythm kidney disease low blood counts (white cells, platelets, or red cells) lung disease  recent or upcoming vaccine an unusual or allergic reaction to rituximab, other medicines, foods, dyes, or preservatives pregnant or trying to get pregnant breast-feeding How should I use this medication? This medicine is injected into a vein. It is given by a health care provider in a hospital or clinic setting. A special MedGuide will be given to you before each treatment. Be sure to read this information carefully each time. Talk to your health care provider about the use of this medicine in children. While this drug may be prescribed for children as young as 6 months for selected conditions, precautions do apply. Overdosage: If you think you have taken too much of this medicine contact a poison control center or emergency room at once. NOTE: This medicine is only for you. Do not share this medicine with others. What if I miss a dose? Keep appointments for follow-up doses. It is important not to miss your dose. Call your health care provider if you are unable to keep an appointment. What may interact with this medication? Do not take this medicine with any of the following medicines: live vaccines This medicine may also interact with the following medicines: cisplatin This list may not describe all possible interactions. Give your health care  provider a list of all the medicines, herbs, non-prescription drugs, or dietary supplements you use. Also tell them if you smoke, drink alcohol, or use illegal drugs. Some items may interact with your medicine. What should I watch for while using this medication? Your condition will be monitored carefully while you are receiving this medicine. You may need blood work done while you are taking this medicine. This medicine can cause serious infusion reactions. To reduce the risk your health care provider may give you other medicines to take before receiving this one. Be sure to follow the directions from your health care provider. This medicine may increase your risk of getting an infection. Call your health care provider for advice if you get a fever, chills, sore throat, or other symptoms of a cold or flu. Do not treat yourself. Try to avoid being around people who are sick. Call your health care provider if you are around anyone with measles, chickenpox, or if you develop sores or blisters that do not heal properly. Avoid taking medicines that contain aspirin, acetaminophen, ibuprofen, naproxen, or ketoprofen unless instructed by your health care provider. These medicines may hide a fever. This medicine may cause serious skin reactions. They can happen weeks to months after starting the medicine. Contact your health care provider right away if you notice fevers or flu-like symptoms with a rash. The rash may be red or purple and then turn into blisters or peeling of the skin. Or, you might notice a red rash with swelling of the face, lips or lymph nodes in your neck or under your arms. In some patients, this medicine may cause a serious brain infection that may cause death. If you have any problems seeing, thinking, speaking, walking, or standing, tell your healthcare professional right away. If you cannot reach your healthcare professional, urgently seek other source of medical care. Do not become pregnant  while taking this medicine or for at least 12 months after stopping it. Women should inform their health care provider if they wish to become pregnant or think they might be pregnant. There is potential for serious harm to an unborn child. Talk to your health care provider for more information. Women should use a reliable form of birth control while taking  this medicine and for 12 months after stopping it. Do not breast-feed while taking this medicine or for at least 6 months after stopping it. What side effects may I notice from receiving this medication? Side effects that you should report to your health care provider as soon as possible: allergic reactions (skin rash, itching or hives; swelling of the face, lips, or tongue) diarrhea edema (sudden weight gain; swelling of the ankles, feet, hands or other unusual swelling; trouble breathing) fast, irregular heartbeat heart attack (trouble breathing; pain or tightness in the chest, neck, back or arms; unusually weak or tired) infection (fever, chills, cough, sore throat, pain or trouble passing urine) kidney injury (trouble passing urine or change in the amount of urine) liver injury (dark yellow or brown urine; general ill feeling or flu-like symptoms; loss of appetite, right upper belly pain; unusually weak or tired, yellowing of the eyes or skin) low blood pressure (dizziness; feeling faint or lightheaded, falls; unusually weak or tired) low red blood cell counts (trouble breathing; feeling faint; lightheaded, falls; unusually weak or tired) mouth sores redness, blistering, peeling, or loosening of the skin, including inside the mouth stomach pain unusual bruising or bleeding wheezing (trouble breathing with loud or whistling sounds) vomiting Side effects that usually do not require medical attention (report to your health care provider if they continue or are bothersome): headache joint pain muscle cramps, pain nausea This list may not  describe all possible side effects. Call your doctor for medical advice about side effects. You may report side effects to FDA at 1-800-FDA-1088. Where should I keep my medication? This medicine is given in a hospital or clinic. It will not be stored at home. NOTE: This sheet is a summary. It may not cover all possible information. If you have questions about this medicine, talk to your doctor, pharmacist, or health care provider.  2022 Elsevier/Gold Standard (2020-09-30 00:00:00)

## 2021-11-20 ENCOUNTER — Telehealth: Payer: Self-pay | Admitting: Hematology

## 2021-11-20 NOTE — Telephone Encounter (Signed)
Scheduled follow-up appointments per 2/8 los. Patient is aware. 

## 2021-11-21 ENCOUNTER — Inpatient Hospital Stay: Payer: 59

## 2021-11-21 ENCOUNTER — Other Ambulatory Visit: Payer: Self-pay

## 2021-11-21 VITALS — BP 153/79 | Temp 98.8°F | Resp 18

## 2021-11-21 DIAGNOSIS — C8330 Diffuse large B-cell lymphoma, unspecified site: Secondary | ICD-10-CM

## 2021-11-21 DIAGNOSIS — Z7189 Other specified counseling: Secondary | ICD-10-CM

## 2021-11-21 DIAGNOSIS — C8512 Unspecified B-cell lymphoma, intrathoracic lymph nodes: Secondary | ICD-10-CM | POA: Diagnosis not present

## 2021-11-21 MED ORDER — PEGFILGRASTIM-BMEZ 6 MG/0.6ML ~~LOC~~ SOSY
6.0000 mg | PREFILLED_SYRINGE | Freq: Once | SUBCUTANEOUS | Status: AC
  Administered 2021-11-21: 6 mg via SUBCUTANEOUS
  Filled 2021-11-21: qty 0.6

## 2021-11-23 ENCOUNTER — Other Ambulatory Visit: Payer: Self-pay | Admitting: Hematology

## 2021-11-25 ENCOUNTER — Encounter: Payer: Self-pay | Admitting: Hematology

## 2021-12-06 ENCOUNTER — Other Ambulatory Visit: Payer: Self-pay | Admitting: Primary Care

## 2021-12-06 ENCOUNTER — Other Ambulatory Visit: Payer: Self-pay | Admitting: Hematology

## 2021-12-06 DIAGNOSIS — R232 Flushing: Secondary | ICD-10-CM

## 2021-12-06 DIAGNOSIS — F419 Anxiety disorder, unspecified: Secondary | ICD-10-CM

## 2021-12-07 ENCOUNTER — Encounter: Payer: Self-pay | Admitting: Hematology

## 2021-12-08 ENCOUNTER — Other Ambulatory Visit: Payer: Self-pay | Admitting: Hematology

## 2021-12-10 ENCOUNTER — Inpatient Hospital Stay: Payer: 59

## 2021-12-10 ENCOUNTER — Inpatient Hospital Stay (HOSPITAL_BASED_OUTPATIENT_CLINIC_OR_DEPARTMENT_OTHER): Payer: 59 | Admitting: Hematology

## 2021-12-10 ENCOUNTER — Inpatient Hospital Stay: Payer: 59 | Attending: Hematology

## 2021-12-10 ENCOUNTER — Other Ambulatory Visit: Payer: Self-pay

## 2021-12-10 VITALS — BP 126/76 | HR 95 | Temp 97.6°F | Resp 18 | Ht 64.0 in | Wt 252.4 lb

## 2021-12-10 DIAGNOSIS — Z8616 Personal history of COVID-19: Secondary | ICD-10-CM | POA: Insufficient documentation

## 2021-12-10 DIAGNOSIS — C8330 Diffuse large B-cell lymphoma, unspecified site: Secondary | ICD-10-CM

## 2021-12-10 DIAGNOSIS — Z87442 Personal history of urinary calculi: Secondary | ICD-10-CM | POA: Insufficient documentation

## 2021-12-10 DIAGNOSIS — Z8051 Family history of malignant neoplasm of kidney: Secondary | ICD-10-CM | POA: Diagnosis not present

## 2021-12-10 DIAGNOSIS — Z95828 Presence of other vascular implants and grafts: Secondary | ICD-10-CM

## 2021-12-10 DIAGNOSIS — Z800 Family history of malignant neoplasm of digestive organs: Secondary | ICD-10-CM | POA: Diagnosis not present

## 2021-12-10 DIAGNOSIS — Z87891 Personal history of nicotine dependence: Secondary | ICD-10-CM | POA: Insufficient documentation

## 2021-12-10 DIAGNOSIS — I10 Essential (primary) hypertension: Secondary | ICD-10-CM | POA: Diagnosis not present

## 2021-12-10 DIAGNOSIS — Z5112 Encounter for antineoplastic immunotherapy: Secondary | ICD-10-CM | POA: Diagnosis not present

## 2021-12-10 DIAGNOSIS — R000 Tachycardia, unspecified: Secondary | ICD-10-CM | POA: Diagnosis not present

## 2021-12-10 DIAGNOSIS — Z5189 Encounter for other specified aftercare: Secondary | ICD-10-CM | POA: Insufficient documentation

## 2021-12-10 DIAGNOSIS — C7951 Secondary malignant neoplasm of bone: Secondary | ICD-10-CM | POA: Insufficient documentation

## 2021-12-10 DIAGNOSIS — C8512 Unspecified B-cell lymphoma, intrathoracic lymph nodes: Secondary | ICD-10-CM | POA: Insufficient documentation

## 2021-12-10 DIAGNOSIS — Z7189 Other specified counseling: Secondary | ICD-10-CM

## 2021-12-10 DIAGNOSIS — Z86010 Personal history of colonic polyps: Secondary | ICD-10-CM | POA: Diagnosis not present

## 2021-12-10 DIAGNOSIS — Z803 Family history of malignant neoplasm of breast: Secondary | ICD-10-CM | POA: Insufficient documentation

## 2021-12-10 DIAGNOSIS — G893 Neoplasm related pain (acute) (chronic): Secondary | ICD-10-CM | POA: Insufficient documentation

## 2021-12-10 DIAGNOSIS — Z79899 Other long term (current) drug therapy: Secondary | ICD-10-CM | POA: Diagnosis not present

## 2021-12-10 DIAGNOSIS — Z5111 Encounter for antineoplastic chemotherapy: Secondary | ICD-10-CM

## 2021-12-10 LAB — CBC WITH DIFFERENTIAL (CANCER CENTER ONLY)
Abs Immature Granulocytes: 0.04 10*3/uL (ref 0.00–0.07)
Basophils Absolute: 0 10*3/uL (ref 0.0–0.1)
Basophils Relative: 1 %
Eosinophils Absolute: 0 10*3/uL (ref 0.0–0.5)
Eosinophils Relative: 0 %
HCT: 31.3 % — ABNORMAL LOW (ref 36.0–46.0)
Hemoglobin: 11.1 g/dL — ABNORMAL LOW (ref 12.0–15.0)
Immature Granulocytes: 1 %
Lymphocytes Relative: 10 %
Lymphs Abs: 0.4 10*3/uL — ABNORMAL LOW (ref 0.7–4.0)
MCH: 34 pg (ref 26.0–34.0)
MCHC: 35.5 g/dL (ref 30.0–36.0)
MCV: 96 fL (ref 80.0–100.0)
Monocytes Absolute: 0.1 10*3/uL (ref 0.1–1.0)
Monocytes Relative: 3 %
Neutro Abs: 3.5 10*3/uL (ref 1.7–7.7)
Neutrophils Relative %: 85 %
Platelet Count: 266 10*3/uL (ref 150–400)
RBC: 3.26 MIL/uL — ABNORMAL LOW (ref 3.87–5.11)
RDW: 17.2 % — ABNORMAL HIGH (ref 11.5–15.5)
WBC Count: 4 10*3/uL (ref 4.0–10.5)
nRBC: 0 % (ref 0.0–0.2)

## 2021-12-10 LAB — CMP (CANCER CENTER ONLY)
ALT: 31 U/L (ref 0–44)
AST: 24 U/L (ref 15–41)
Albumin: 4.3 g/dL (ref 3.5–5.0)
Alkaline Phosphatase: 84 U/L (ref 38–126)
Anion gap: 7 (ref 5–15)
BUN: 18 mg/dL (ref 6–20)
CO2: 29 mmol/L (ref 22–32)
Calcium: 9.5 mg/dL (ref 8.9–10.3)
Chloride: 103 mmol/L (ref 98–111)
Creatinine: 0.71 mg/dL (ref 0.44–1.00)
GFR, Estimated: >60 mL/min (ref >60)
Glucose, Bld: 170 mg/dL — ABNORMAL HIGH (ref 70–99)
Potassium: 3.5 mmol/L (ref 3.5–5.1)
Sodium: 139 mmol/L (ref 135–145)
Total Bilirubin: 0.4 mg/dL (ref 0.3–1.2)
Total Protein: 6.7 g/dL (ref 6.5–8.1)

## 2021-12-10 LAB — LACTATE DEHYDROGENASE: LDH: 234 U/L — ABNORMAL HIGH (ref 98–192)

## 2021-12-10 MED ORDER — SODIUM CHLORIDE 0.9 % IV SOLN
150.0000 mg | Freq: Once | INTRAVENOUS | Status: AC
  Administered 2021-12-10: 150 mg via INTRAVENOUS
  Filled 2021-12-10: qty 150

## 2021-12-10 MED ORDER — ACETAMINOPHEN 325 MG PO TABS
650.0000 mg | ORAL_TABLET | Freq: Once | ORAL | Status: AC
  Administered 2021-12-10: 650 mg via ORAL
  Filled 2021-12-10: qty 2

## 2021-12-10 MED ORDER — SODIUM CHLORIDE 0.9% FLUSH
10.0000 mL | INTRAVENOUS | Status: DC | PRN
  Administered 2021-12-10: 10 mL

## 2021-12-10 MED ORDER — HEPARIN SOD (PORK) LOCK FLUSH 100 UNIT/ML IV SOLN
500.0000 [IU] | Freq: Once | INTRAVENOUS | Status: AC | PRN
  Administered 2021-12-10: 500 [IU]

## 2021-12-10 MED ORDER — SODIUM CHLORIDE 0.9 % IV SOLN
10.0000 mg | Freq: Once | INTRAVENOUS | Status: AC
  Administered 2021-12-10: 10 mg via INTRAVENOUS
  Filled 2021-12-10: qty 10

## 2021-12-10 MED ORDER — FAMOTIDINE IN NACL 20-0.9 MG/50ML-% IV SOLN
20.0000 mg | Freq: Once | INTRAVENOUS | Status: AC
  Administered 2021-12-10: 20 mg via INTRAVENOUS
  Filled 2021-12-10: qty 50

## 2021-12-10 MED ORDER — SODIUM CHLORIDE 0.9 % IV SOLN: Freq: Once | INTRAVENOUS | Status: AC

## 2021-12-10 MED ORDER — DIPHENHYDRAMINE HCL 25 MG PO CAPS
50.0000 mg | ORAL_CAPSULE | Freq: Once | ORAL | Status: AC
  Administered 2021-12-10: 50 mg via ORAL
  Filled 2021-12-10: qty 2

## 2021-12-10 MED ORDER — VINCRISTINE SULFATE CHEMO INJECTION 1 MG/ML
2.0000 mg | Freq: Once | INTRAVENOUS | Status: AC
  Administered 2021-12-10: 2 mg via INTRAVENOUS
  Filled 2021-12-10: qty 2

## 2021-12-10 MED ORDER — PALONOSETRON HCL INJECTION 0.25 MG/5ML
0.2500 mg | Freq: Once | INTRAVENOUS | Status: AC
  Administered 2021-12-10: 0.25 mg via INTRAVENOUS
  Filled 2021-12-10: qty 5

## 2021-12-10 MED ORDER — SODIUM CHLORIDE 0.9% FLUSH
10.0000 mL | Freq: Once | INTRAVENOUS | Status: AC
  Administered 2021-12-10: 10 mL

## 2021-12-10 MED ORDER — SODIUM CHLORIDE 0.9 % IV SOLN
375.0000 mg/m2 | Freq: Once | INTRAVENOUS | Status: AC
  Administered 2021-12-10: 800 mg via INTRAVENOUS
  Filled 2021-12-10: qty 50

## 2021-12-10 MED ORDER — DOXORUBICIN HCL CHEMO IV INJECTION 2 MG/ML
50.0000 mg/m2 | Freq: Once | INTRAVENOUS | Status: AC
  Administered 2021-12-10: 112 mg via INTRAVENOUS
  Filled 2021-12-10: qty 56

## 2021-12-10 MED ORDER — SODIUM CHLORIDE 0.9 % IV SOLN
750.0000 mg/m2 | Freq: Once | INTRAVENOUS | Status: AC
  Administered 2021-12-10: 1660 mg via INTRAVENOUS
  Filled 2021-12-10: qty 83

## 2021-12-10 NOTE — Patient Instructions (Signed)
[facility]  ? Discharge Instructions: ?Thank you for choosing [facility] to provide your oncology and hematology care.  ? ?If you have a lab appointment with the [facility], please go directly to the [facility] and check in at the registration area. ?  ?Wear comfortable clothing and clothing appropriate for easy access to any Portacath or PICC line.  ? ?We strive to give you quality time with your provider. You may need to reschedule your appointment if you arrive late (15 or more minutes).  Arriving late affects you and other patients whose appointments are after yours.  Also, if you miss three or more appointments without notifying the office, you may be dismissed from the clinic at the provider?s discretion.    ?  ?For prescription refill requests, have your pharmacy contact our office and allow 72 hours for refills to be completed.   ? ?Today you received the following chemotherapy and/or immunotherapy agents: Doxorubicin (Adriamycin), Vincristine (Oncovin), Cyclophosphamide (Cytoxan), and Rituximab (Rituxan)     ?  ?To help prevent nausea and vomiting after your treatment, we encourage you to take your nausea medication as directed. ? ?BELOW ARE SYMPTOMS THAT SHOULD BE REPORTED IMMEDIATELY: ?*FEVER GREATER THAN 100.4 F (38 ?C) OR HIGHER ?*CHILLS OR SWEATING ?*NAUSEA AND VOMITING THAT IS NOT CONTROLLED WITH YOUR NAUSEA MEDICATION ?*UNUSUAL SHORTNESS OF BREATH ?*UNUSUAL BRUISING OR BLEEDING ?*URINARY PROBLEMS (pain or burning when urinating, or frequent urination) ?*BOWEL PROBLEMS (unusual diarrhea, constipation, pain near the anus) ?TENDERNESS IN MOUTH AND THROAT WITH OR WITHOUT PRESENCE OF ULCERS (sore throat, sores in mouth, or a toothache) ?UNUSUAL RASH, SWELLING OR PAIN  ?UNUSUAL VAGINAL DISCHARGE OR ITCHING  ? ?Items with * indicate a potential emergency and should be followed up as soon as possible or go to the Emergency Department if any problems should  occur. ? ?Please show the CHEMOTHERAPY ALERT CARD or IMMUNOTHERAPY ALERT CARD at check-in to the Emergency Department and triage nurse. ? ?Should you have questions after your visit or need to cancel or reschedule your appointment, please contact [facility]  Dept: [phone]  and follow the prompts.  Office hours are 8:00 a.m. to 4:30 p.m. Monday - Friday. Please note that voicemails left after 4:00 p.m. may not be returned until the following business day.  We are closed weekends and major holidays. You have access to a nurse at all times for urgent questions. Please call the main number to the clinic Dept: [phone] and follow the prompts. ? ? ?For any non-urgent questions, you may also contact your provider using MyChart. We now offer e-Visits for anyone 105 and older to request care online for non-urgent symptoms. For details visit [email or web address]. ?  ?Also download the MyChart app! Go to the app store, search "MyChart", open the app, select [facility], and log in with your MyChart username and password. ? ?Due to Covid, a mask is required upon entering the hospital/clinic. If you do not have a mask, one will be given to you upon arrival. For doctor visits, patients may have 1 support person aged 94 or older with them. For treatment visits, patients cannot have anyone with them due to current Covid guidelines and our immunocompromised population.  ? ?

## 2021-12-10 NOTE — Progress Notes (Signed)
Per Dr. Irene Limbo - okay to treat with elevated HR of 102 today and to use echo from 09/08/2021. ?

## 2021-12-12 ENCOUNTER — Other Ambulatory Visit: Payer: Self-pay

## 2021-12-12 ENCOUNTER — Inpatient Hospital Stay: Payer: 59

## 2021-12-12 VITALS — BP 154/90 | HR 88 | Temp 98.1°F | Resp 18

## 2021-12-12 DIAGNOSIS — Z7189 Other specified counseling: Secondary | ICD-10-CM

## 2021-12-12 DIAGNOSIS — C8330 Diffuse large B-cell lymphoma, unspecified site: Secondary | ICD-10-CM

## 2021-12-12 DIAGNOSIS — C8512 Unspecified B-cell lymphoma, intrathoracic lymph nodes: Secondary | ICD-10-CM | POA: Diagnosis not present

## 2021-12-12 MED ORDER — PEGFILGRASTIM-BMEZ 6 MG/0.6ML ~~LOC~~ SOSY
6.0000 mg | PREFILLED_SYRINGE | Freq: Once | SUBCUTANEOUS | Status: AC
  Administered 2021-12-12: 6 mg via SUBCUTANEOUS
  Filled 2021-12-12: qty 0.6

## 2021-12-16 ENCOUNTER — Other Ambulatory Visit: Payer: Self-pay | Admitting: Hematology

## 2021-12-16 NOTE — Progress Notes (Signed)
[name]   HEMATOLOGY/ONCOLOGY CLINIC NOTE  Date of Service: .12/10/2021   Patient Care Team: Pleas Koch, NP as PCP - General (Internal Medicine) Kristeen Miss, MD as Consulting Physician (Neurosurgery) Rolm Bookbinder, MD as Consulting Physician (General Surgery)  CHIEF COMPLAINTS/PURPOSE OF CONSULTATION:  Follow-up for continued evaluation and management of large B-cell lymphoma  HISTORY OF PRESENTING ILLNESS:  Please see previous note for details of initial presentation  INTERVAL HISTORY  .Virginia Oneal is here for continued evaluation and management of her large B-cell lymphoma and cycle 5 of R-CHOP chemotherapy. She notes no overt toxicities since her last clinic visit.  Notes some change in bowel habits with constipation and alternating diarrhea. No fevers no chills no night sweats. No unexpected weight loss. Labs done today reviewed with the patient.  MEDICAL HISTORY:  Past Medical History:  Diagnosis Date   Abnormal uterine bleeding    Anemia    as a teenager   Anxiety    postpartum and hx panic attacks   Arrhythmia    Arthritis    back and hands   Chest mass    by CT scanning   Chest pain 65/78/4696   Complication of anesthesia    slow to wake up   COVID-19 10/2020   Depression    DOE (dyspnea on exertion) 07/26/2014   Family history of adverse reaction to anesthesia    mom slow to wake up   Gestational diabetes    resolved after pregnancy   History of kidney stones    Hypertension    Personal history of colonic polyps    PONV (postoperative nausea and vomiting)    PREMATURE ATRIAL CONTRACTIONS 08/15/2008   Qualifier: Diagnosis of  By: Council Mechanic MD, Hilaria Ota    RENAL CALCULUS, HX OF 08/15/2008   Qualifier: Diagnosis of  By: Zara Council LPN, Wanda     Rotator cuff tendinitis 06/29/2011   Seasonal allergies    Urinary incontinence 05/2009   hx bladder sling--Dr. Quincy Simmonds    SURGICAL HISTORY: Past Surgical History:  Procedure Laterality Date    ABDOMINAL HYSTERECTOMY  05/2009   TAH/LSO   BLADDER SUSPENSION  2010   Dr. Quincy Simmonds   BREAST BIOPSY     left stereo   CESAREAN SECTION  03/2003   CESAREAN SECTION  2004   CHOLECYSTECTOMY N/A 01/02/2021   Procedure: LAPAROSCOPIC CHOLECYSTECTOMY;  Surgeon: Rolm Bookbinder, MD;  Location: [facility];  Service: General;  Laterality: N/A;  58  - GEN AND TAP BLOCK LDOW   COLONOSCOPY  09/08   COLPORRHAPHY  05/2009   Dr. Quincy Simmonds   ENDOMETRIAL ABLATION  2006   Dr. Quincy Simmonds   INCONTINENCE SURGERY     sling for urinary incontinence   IR IMAGING GUIDED PORT INSERTION  09/01/2021   KYPHOPLASTY N/A 04/07/2021   Procedure: Thoracic 12 Vertebral body biopsy;  Surgeon: Kristeen Miss, MD;  Location: [facility];  Service: Neurosurgery;  Laterality: N/A;   LAPAROSCOPIC ENDOMETRIOSIS FULGURATION     ablation   LUMBAR LAMINECTOMY/DECOMPRESSION MICRODISCECTOMY N/A 07/31/2021   Procedure: Open Transpedicular biopsy of Thoracic twelve;  Surgeon: Kristeen Miss, MD;  Location: [facility];  Service: Neurosurgery;  Laterality: N/A;   OOPHORECTOMY  2010   w/hyst--Dr. Quincy Simmonds   TUBAL LIGATION  2004   w/ C-section Dr. Quincy Simmonds   TYMPANOPLASTY     right ear   VAGINAL HYSTERECTOMY  05/21/2009   lap assisted /partial    SOCIAL HISTORY: Social History   Socioeconomic History   Marital status: Married  Spouse name: Not on file   Number of children: 2   Years of education: Not on file   Highest education level: Not on file  Occupational History   Occupation: [employer]  Tobacco Use   Smoking status: Former    Years: 10.00    Types: Cigarettes    Quit date: 01/06/1999    Years since quitting: 22.9   Smokeless tobacco: Never  Vaping Use   Vaping Use: Never used  Substance and Sexual Activity   Alcohol use: No    Alcohol/week: 0.0 standard drinks   Drug use: No   Sexual activity: Yes    Partners: Male    Birth control/protection: Other-see comments, Surgical    Comment: TAH/LSO  Other Topics Concern   Not on file   Social History Narrative   ** Merged History Encounter **       Social Determinants of Health   Financial Resource Strain: Not on file  Food Insecurity: Not on file  Transportation Needs: Not on file  Physical Activity: Not on file  Stress: Not on file  Social Connections: Not on file  Intimate Partner Violence: Not on file    FAMILY HISTORY: Family History  Problem Relation Age of Onset   Kidney disease Mother        renal failure   Stroke Mother    Diabetes Mother    Hypertension Mother    Heart failure Mother    Colon cancer Mother    Stroke Father    Cancer - Colon Maternal Aunt    Hypertension Sister    Rheum arthritis Sister    Hypertension Brother    Hypertension Sister    Hypertension Sister    Hypertension Sister    Breast cancer Sister    Kidney cancer Sister    Esophageal cancer Neg Hx    Rectal cancer Neg Hx    Stomach cancer Neg Hx     ALLERGIES:  is allergic to buspar [buspirone hcl].  MEDICATIONS:  Current Outpatient Medications  Medication Sig Dispense Refill   acetaminophen (TYLENOL) 500 MG tablet Take 1,000 mg by mouth every 6 (six) hours as needed for moderate pain or headache.     b complex vitamins capsule Take 1 capsule by mouth daily.     betamethasone valerate ointment (VALISONE) 0.1 % Apply 1 application topically 2 (two) times daily. (Patient taking differently: Apply 1 application topically 2 (two) times daily as needed (rash).) 45 g 1   Biotin 10000 MCG TABS Take 10,000 mcg by mouth daily.     Black Cohosh 40 MG CAPS Take 40 mg by mouth daily.     cetirizine (ZYRTEC) 10 MG tablet Take 10 mg by mouth daily.      Cholecalciferol (VITAMIN D3) 125 MCG (5000 UT) CAPS Take 5,000 Units by mouth daily.     COVID-19 mRNA bivalent vaccine, Moderna, (MODERNA COVID-19 BIVAL BOOSTER) 50 MCG/0.5ML injection Inject into the muscle. 0.5 mL 0   hydrochlorothiazide (HYDRODIURIL) 25 MG tablet Take 1 tablet (25 mg total) by mouth daily. For blood  pressure. 90 tablet 3   HYDROcodone-acetaminophen (NORCO) 5-325 MG tablet Take 1-2 tablets by mouth every 6 (six) hours as needed for moderate pain or severe pain. 10 tablet 0   hydrOXYzine (ATARAX/VISTARIL) 10 MG tablet Take 1 or 2 tablets by mouth as needed for anxiety attack. 30 tablet 0   influenza vac split quadrivalent PF (FLUARIX) 0.5 ML injection Inject into the muscle. 0.5 mL 0  ketorolac (TORADOL) 10 MG tablet Take 1 tablet (10 mg total) by mouth every 6 (six) hours as needed for moderate pain or severe pain. 20 tablet 0   KLOR-CON M20 20 MEQ tablet TAKE 1 TABLET BY MOUTH TWICE A DAY 30 tablet 0   lidocaine-prilocaine (EMLA) cream Apply to affected area once 30 g 3   LORazepam (ATIVAN) 0.5 MG tablet Take 1 tablet (0.5 mg total) by mouth every 12 (twelve) hours as needed for anxiety. 30 tablet 1   [place] 500 MG CAPS Take 500 mg by mouth daily.     Methylcobalamin (B-12) 5000 MCG TBDP Take 5,000 mcg by mouth daily.     metoprolol succinate (TOPROL-XL) 25 MG 24 hr tablet TAKE 1 TABLET (25 MG TOTAL) BY MOUTH DAILY. 30 tablet 1   omeprazole (PRILOSEC) 40 MG capsule TAKE 1 CAPSULE (40 MG TOTAL) BY MOUTH DAILY. 90 capsule 1   ondansetron (ZOFRAN) 8 MG tablet Take 1 tablet (8 mg total) by mouth 2 (two) times daily as needed for refractory nausea / vomiting. Start on day 3 after cyclophosphamide chemotherapy. 30 tablet 1   predniSONE (DELTASONE) 20 MG tablet Take 3 tablets (60 mg total) by mouth daily. Take with food on days 1-5 of chemotherapy. 15 tablet 5   Prenatal Vit-Fe Fumarate-FA (PRENATAL MULTIVITAMIN) TABS tablet Take 1 tablet by mouth daily.     prochlorperazine (COMPAZINE) 10 MG tablet Take 1 tablet (10 mg total) by mouth every 6 (six) hours as needed (Nausea or vomiting). 30 tablet 6   Turmeric 500 MG CAPS Take 500 mg by mouth daily.     venlafaxine XR (EFFEXOR-XR) 37.5 MG 24 hr capsule TAKE 1 CAPSULE (37.5 MG TOTAL) BY MOUTH DAILY WITH BREAKFAST. FOR HOT FLASHES AND ANXIETY 90 capsule 1    No current facility-administered medications for this visit.    REVIEW OF SYSTEMS:   10 Point review of Systems was done is negative except as noted above.  PHYSICAL EXAMINATION: ECOG PERFORMANCE STATUS: 1 - Symptomatic but completely ambulatory NAD GENERAL:alert, in no acute distress and comfortable SKIN: no acute rashes, no significant lesions EYES: conjunctiva are pink and non-injected, sclera anicteric OROPHARYNX: MMM, no exudates, no oropharyngeal erythema or ulceration NECK: supple, no JVD LYMPH:  no palpable lymphadenopathy in the cervical, axillary or inguinal regions LUNGS: clear to auscultation b/l with normal respiratory effort HEART: regular rate & rhythm ABDOMEN:  normoactive bowel sounds , non tender, not distended. Extremity: no pedal edema PSYCH: alert & oriented x 3 with fluent speech NEURO: no focal motor/sensory deficits   LABORATORY DATA:  I have reviewed the data as listed  . CBC Latest Ref Rng & Units 12/10/2021 11/19/2021 10/29/2021  WBC 4.0 - 10.5 K/uL 4.0 4.1 5.6  Hemoglobin 12.0 - 15.0 g/dL 11.1(L) 10.8(L) 11.0(L)  Hematocrit 36.0 - 46.0 % 31.3(L) 32.0(L) 32.6(L)  Platelets 150 - 400 K/uL 266 243 177   . CMP Latest Ref Rng & Units 12/10/2021 11/19/2021 10/29/2021  Glucose 70 - 99 mg/dL 170(H) 189(H) 134(H)  BUN 6 - 20 mg/dL 18 18 15   Creatinine 0.44 - 1.00 mg/dL 0.71 0.70 0.66  Sodium 135 - 145 mmol/L 139 139 141  Potassium 3.5 - 5.1 mmol/L 3.5 3.8 3.6  Chloride 98 - 111 mmol/L 103 103 105  CO2 22 - 32 mmol/L 29 28 29   Calcium 8.9 - 10.3 mg/dL 9.5 9.4 9.5  Total Protein 6.5 - 8.1 g/dL 6.7 6.7 6.4(L)  Total Bilirubin 0.3 - 1.2 mg/dL 0.4 0.4  0.3  Alkaline Phos 38 - 126 U/L 84 81 80  AST 15 - 41 U/L 24 20 22   ALT 0 - 44 U/L 31 27 26    Lab Results  Component Value Date   LDH 234 (H) 12/10/2021    HIV Screen 4th Generation wRfx     Non Reactive Non Reactive  Hepatitis B Surface Ag     NON REACTIVE NON REACTIVE  Hep B Core Total Ab     NON  REACTIVE NON REACTIVE  HCV Ab     NON REACTIVE NON REACTIVE  LDH     98 - 192 U/L 228 (H)    SURGICAL PATHOLOGY  CASE: [id number]  PATIENT: Amire Kowal  Surgical Pathology Report   Clinical History: lesion of thoracic vertebra (cm)   FINAL MICROSCOPIC DIAGNOSIS:   A-C. BONE, T12 VERTEBRAL BODY AND MARROW, BIOPSY:  -  Non-Hodgkin B-cell lymphoma, high grade  -  See comment   COMMENT:   A-C.  The biopsies show normal hematopoeisis admixed with crush artifact  with associated marrow fibrosis and an atypical cellular infiltrate  composed of medium to large cells with irregular nuclear contours, ample  cytoplasm, variably prominent nucleoli, increased mitoses and apoptosis.  An initial panel of immunohistochemistry (cytokeratin AE1/3, cytokeratin  7, cytokeratin 20, CD20 and CD3) confirmed the morphologic impression  that the atypical cells were lymphoid in nature.  The atypical  lymphocytes are positive for Pax-5, CD20, CD10, bcl-6 but negative for  CD30, bcl-2, mum-1 and EBV by in-situ hybridization. CD3 and CD5  highlight background T-cells.  The proliferative rate by ki-67 is  increased (50-60%).  Overall, the findings are consistent with a  non-Hodgkin B-cell lymphoma, high grade with a germinal center  phenotype.   FISH studies are pending and will be reported in an  addendum.  These findings were discussed with Dr. Ellene Route on August 06, 2021.    RADIOGRAPHIC STUDIES: I have personally reviewed the radiological images as listed and agreed with the findings in the report.  ECHO 09/08/2021 1.Left ventricular ejection fraction, by estimation, is 55 to 60%. The left ventricle has normal function. The left ventricle has no regional wall motion abnormalities. There ismild left ventricular hypertrophy. Left ventricular diastolic parameters were normal.The average left ventricular global longitudinal strain is -20.4 %. The global longitudinal strain is normal. 2. Right  ventricular systolic function is normal. The right ventricular size is normal. 3. Left atrial size was mildly dilated. The mitral valve is normal in structure. Trivial mitral valve regurgitation. No evidence of mitral stenosis. Moderate mitral annular calcification. 4.The aortic valve is normal in structure. Aortic valve regurgitation is not visualized. No aortic stenosis is present. 5.The inferior vena cava is normal in size with greater than 50% respiratory variability, suggesting right atrial pressure of 3 mmHg.  No results found.  ASSESSMENT & PLAN:   56 year old female with  1) Stage IVA high-grade large B cell non-Hodgkin's lymphoma.  Germinal center phenotype.  C-Myc not rearranged.  Ki-67 50 to 60% No overt constitutional symptoms PET/CT with involvement of thoracic spine and acromion process on the right.  Abdominal lymphadenopathy.  Hepatitis testing unremarkable HIV nonreactive Myeloma panel shows no M spike protein spike.  Echo cardiogram shows normal ejection fraction  PET CT scan done on 09/02/2021 shows Scattered mild hypermetabolism within subcentimeter periportal, retroperitoneal and mesenteric lymph nodes, compatible with the given history of large B-cell lymphoma. 2. Hypermetabolism within known T7 and T12 lesions, better seen on MR thoracic  spine 07/04/2021. 3. Focal hypermetabolism in the acromion of the right scapula, without a definite CT correlate. Recommend attention on follow-up.  2) Chemotherapy related neutropenia -now resolved.  No issues with infections.    3) G-CSF related significant bone pain. PLAN -Labs done on 12/10/2021 reviewed with the patient in detail CBC 10.8 hemoglobin, normal platelets of 243k and normal WBC count of 4.1k CMP  LDH 223 No clinical or lab evidence of lymphoma progression at this time Patient appropriate to proceed with her fifth cycle of R-CHOP with current supportive medications.  3) Patient with palpitations with mild  persistent tachycardia with heart rates up to 120s to 130s on her apple watch. Patient notes that she has not had any palpitations since her last clinic visit. She noted that her heart rates have improved and are in the 80-100 range and she opted to not start the Toprol XL. PLAN -No further palpitations noted.  Patient did not take the prescribed low-dose beta-blocker.  Encouraged hydration.  Follow-up Please schedule cycle 6 of R-CHOP as ordered. Port flush labs and MD visits on cycle 6-day 1    The total time spent in the appointment was 30 minutes*.  All of the patient's questions were answered with apparent satisfaction. The patient knows to call the clinic with any problems, questions or concerns.   Sullivan Lone MD MS AAHIVMS [facility] Hematology/Oncology Physician [facility]  .*Total Encounter Time as defined by the Centers for Medicare and Medicaid Services includes, in addition to the face-to-face time of a patient visit (documented in the note above) non-face-to-face time: obtaining and reviewing outside history, ordering and reviewing medications, tests or procedures, care coordination (communications with other health care professionals or caregivers) and documentation in the medical record.

## 2021-12-17 ENCOUNTER — Encounter: Payer: Self-pay | Admitting: Hematology

## 2021-12-22 ENCOUNTER — Other Ambulatory Visit: Payer: Self-pay | Admitting: Hematology

## 2021-12-28 ENCOUNTER — Other Ambulatory Visit: Payer: Self-pay | Admitting: Hematology

## 2021-12-29 ENCOUNTER — Encounter: Payer: Self-pay | Admitting: Hematology

## 2021-12-30 ENCOUNTER — Other Ambulatory Visit: Payer: Self-pay | Admitting: *Deleted

## 2021-12-30 ENCOUNTER — Other Ambulatory Visit: Payer: Self-pay

## 2021-12-30 DIAGNOSIS — C8330 Diffuse large B-cell lymphoma, unspecified site: Secondary | ICD-10-CM

## 2021-12-31 ENCOUNTER — Inpatient Hospital Stay (HOSPITAL_BASED_OUTPATIENT_CLINIC_OR_DEPARTMENT_OTHER): Payer: 59 | Admitting: Hematology

## 2021-12-31 ENCOUNTER — Inpatient Hospital Stay: Payer: 59

## 2021-12-31 ENCOUNTER — Other Ambulatory Visit: Payer: Self-pay

## 2021-12-31 VITALS — BP 161/80 | HR 102 | Temp 97.5°F | Resp 20 | Wt 257.0 lb

## 2021-12-31 VITALS — BP 132/80 | HR 96 | Temp 98.0°F | Resp 18

## 2021-12-31 DIAGNOSIS — Z5111 Encounter for antineoplastic chemotherapy: Secondary | ICD-10-CM | POA: Diagnosis not present

## 2021-12-31 DIAGNOSIS — Z7189 Other specified counseling: Secondary | ICD-10-CM

## 2021-12-31 DIAGNOSIS — C8330 Diffuse large B-cell lymphoma, unspecified site: Secondary | ICD-10-CM

## 2021-12-31 DIAGNOSIS — C8512 Unspecified B-cell lymphoma, intrathoracic lymph nodes: Secondary | ICD-10-CM | POA: Diagnosis not present

## 2021-12-31 DIAGNOSIS — Z95828 Presence of other vascular implants and grafts: Secondary | ICD-10-CM

## 2021-12-31 LAB — CMP (CANCER CENTER ONLY)
ALT: 31 U/L (ref 0–44)
AST: 25 U/L (ref 15–41)
Albumin: 4.1 g/dL (ref 3.5–5.0)
Alkaline Phosphatase: 76 U/L (ref 38–126)
Anion gap: 6 (ref 5–15)
BUN: 15 mg/dL (ref 6–20)
CO2: 30 mmol/L (ref 22–32)
Calcium: 9.7 mg/dL (ref 8.9–10.3)
Chloride: 103 mmol/L (ref 98–111)
Creatinine: 0.65 mg/dL (ref 0.44–1.00)
GFR, Estimated: >60 mL/min (ref >60)
Glucose, Bld: 174 mg/dL — ABNORMAL HIGH (ref 70–99)
Potassium: 3.7 mmol/L (ref 3.5–5.1)
Sodium: 139 mmol/L (ref 135–145)
Total Bilirubin: 0.4 mg/dL (ref 0.3–1.2)
Total Protein: 6.2 g/dL — ABNORMAL LOW (ref 6.5–8.1)

## 2021-12-31 LAB — CBC WITH DIFFERENTIAL (CANCER CENTER ONLY)
Abs Immature Granulocytes: 0.02 10*3/uL (ref 0.00–0.07)
Basophils Absolute: 0 10*3/uL (ref 0.0–0.1)
Basophils Relative: 1 %
Eosinophils Absolute: 0 10*3/uL (ref 0.0–0.5)
Eosinophils Relative: 0 %
HCT: 30.6 % — ABNORMAL LOW (ref 36.0–46.0)
Hemoglobin: 10.3 g/dL — ABNORMAL LOW (ref 12.0–15.0)
Immature Granulocytes: 0 %
Lymphocytes Relative: 12 %
Lymphs Abs: 0.6 10*3/uL — ABNORMAL LOW (ref 0.7–4.0)
MCH: 33.4 pg (ref 26.0–34.0)
MCHC: 33.7 g/dL (ref 30.0–36.0)
MCV: 99.4 fL (ref 80.0–100.0)
Monocytes Absolute: 0.5 10*3/uL (ref 0.1–1.0)
Monocytes Relative: 11 %
Neutro Abs: 3.9 10*3/uL (ref 1.7–7.7)
Neutrophils Relative %: 76 %
Platelet Count: 226 10*3/uL (ref 150–400)
RBC: 3.08 MIL/uL — ABNORMAL LOW (ref 3.87–5.11)
RDW: 15.8 % — ABNORMAL HIGH (ref 11.5–15.5)
WBC Count: 5.1 10*3/uL (ref 4.0–10.5)
nRBC: 0 % (ref 0.0–0.2)

## 2021-12-31 LAB — LACTATE DEHYDROGENASE: LDH: 225 U/L — ABNORMAL HIGH (ref 98–192)

## 2021-12-31 MED ORDER — HEPARIN SOD (PORK) LOCK FLUSH 100 UNIT/ML IV SOLN
500.0000 [IU] | Freq: Once | INTRAVENOUS | Status: AC | PRN
  Administered 2021-12-31: 500 [IU]

## 2021-12-31 MED ORDER — SODIUM CHLORIDE 0.9 % IV SOLN: Freq: Once | INTRAVENOUS | Status: AC

## 2021-12-31 MED ORDER — DIPHENHYDRAMINE HCL 25 MG PO CAPS
50.0000 mg | ORAL_CAPSULE | Freq: Once | ORAL | Status: AC
  Administered 2021-12-31: 50 mg via ORAL
  Filled 2021-12-31: qty 2

## 2021-12-31 MED ORDER — VINCRISTINE SULFATE CHEMO INJECTION 1 MG/ML
2.0000 mg | Freq: Once | INTRAVENOUS | Status: AC
  Administered 2021-12-31: 2 mg via INTRAVENOUS
  Filled 2021-12-31: qty 2

## 2021-12-31 MED ORDER — SODIUM CHLORIDE 0.9 % IV SOLN
750.0000 mg/m2 | Freq: Once | INTRAVENOUS | Status: AC
  Administered 2021-12-31: 1660 mg via INTRAVENOUS
  Filled 2021-12-31: qty 83

## 2021-12-31 MED ORDER — DOXORUBICIN HCL CHEMO IV INJECTION 2 MG/ML
50.0000 mg/m2 | Freq: Once | INTRAVENOUS | Status: AC
  Administered 2021-12-31: 112 mg via INTRAVENOUS
  Filled 2021-12-31: qty 56

## 2021-12-31 MED ORDER — SODIUM CHLORIDE 0.9% FLUSH
10.0000 mL | INTRAVENOUS | Status: DC | PRN
  Administered 2021-12-31: 10 mL

## 2021-12-31 MED ORDER — SODIUM CHLORIDE 0.9 % IV SOLN
10.0000 mg | Freq: Once | INTRAVENOUS | Status: AC
  Administered 2021-12-31: 10 mg via INTRAVENOUS
  Filled 2021-12-31: qty 10

## 2021-12-31 MED ORDER — ACETAMINOPHEN 325 MG PO TABS
650.0000 mg | ORAL_TABLET | Freq: Once | ORAL | Status: AC
  Administered 2021-12-31: 650 mg via ORAL
  Filled 2021-12-31: qty 2

## 2021-12-31 MED ORDER — SODIUM CHLORIDE 0.9% FLUSH
10.0000 mL | Freq: Once | INTRAVENOUS | Status: AC
  Administered 2021-12-31: 10 mL

## 2021-12-31 MED ORDER — SODIUM CHLORIDE 0.9 % IV SOLN
150.0000 mg | Freq: Once | INTRAVENOUS | Status: AC
  Administered 2021-12-31: 150 mg via INTRAVENOUS
  Filled 2021-12-31: qty 150

## 2021-12-31 MED ORDER — SODIUM CHLORIDE 0.9 % IV SOLN
375.0000 mg/m2 | Freq: Once | INTRAVENOUS | Status: AC
  Administered 2021-12-31: 800 mg via INTRAVENOUS
  Filled 2021-12-31: qty 50

## 2021-12-31 MED ORDER — PALONOSETRON HCL INJECTION 0.25 MG/5ML
0.2500 mg | Freq: Once | INTRAVENOUS | Status: AC
  Administered 2021-12-31: 0.25 mg via INTRAVENOUS
  Filled 2021-12-31: qty 5

## 2021-12-31 MED ORDER — FAMOTIDINE IN NACL 20-0.9 MG/50ML-% IV SOLN
20.0000 mg | Freq: Once | INTRAVENOUS | Status: AC
  Administered 2021-12-31: 20 mg via INTRAVENOUS
  Filled 2021-12-31: qty 50

## 2021-12-31 NOTE — Patient Instructions (Signed)

## 2021-12-31 NOTE — Progress Notes (Signed)
Ok to treat with echo from 09/08/21 per Dr. Irene Limbo  ?

## 2021-12-31 NOTE — Patient Instructions (Signed)
[facility]  ? Discharge Instructions: ?Thank you for choosing [facility] to provide your oncology and hematology care.  ? ?If you have a lab appointment with the [facility], please go directly to the [facility] and check in at the registration area. ?  ?Wear comfortable clothing and clothing appropriate for easy access to any Portacath or PICC line.  ? ?We strive to give you quality time with your provider. You may need to reschedule your appointment if you arrive late (15 or more minutes).  Arriving late affects you and other patients whose appointments are after yours.  Also, if you miss three or more appointments without notifying the office, you may be dismissed from the clinic at the provider?s discretion.    ?  ?For prescription refill requests, have your pharmacy contact our office and allow 72 hours for refills to be completed.   ? ?Today you received the following chemotherapy and/or immunotherapy agents: Doxorubicin (Adriamycin), Vincristine (Oncovin), Cyclophosphamide (Cytoxan), and Rituximab (Rituxan)     ?  ?To help prevent nausea and vomiting after your treatment, we encourage you to take your nausea medication as directed. ? ?BELOW ARE SYMPTOMS THAT SHOULD BE REPORTED IMMEDIATELY: ?*FEVER GREATER THAN 100.4 F (38 ?C) OR HIGHER ?*CHILLS OR SWEATING ?*NAUSEA AND VOMITING THAT IS NOT CONTROLLED WITH YOUR NAUSEA MEDICATION ?*UNUSUAL SHORTNESS OF BREATH ?*UNUSUAL BRUISING OR BLEEDING ?*URINARY PROBLEMS (pain or burning when urinating, or frequent urination) ?*BOWEL PROBLEMS (unusual diarrhea, constipation, pain near the anus) ?TENDERNESS IN MOUTH AND THROAT WITH OR WITHOUT PRESENCE OF ULCERS (sore throat, sores in mouth, or a toothache) ?UNUSUAL RASH, SWELLING OR PAIN  ?UNUSUAL VAGINAL DISCHARGE OR ITCHING  ? ?Items with * indicate a potential emergency and should be followed up as soon as possible or go to the Emergency Department if any problems should  occur. ? ?Please show the CHEMOTHERAPY ALERT CARD or IMMUNOTHERAPY ALERT CARD at check-in to the Emergency Department and triage nurse. ? ?Should you have questions after your visit or need to cancel or reschedule your appointment, please contact [facility]  Dept: [phone]  and follow the prompts.  Office hours are 8:00 a.m. to 4:30 p.m. Monday - Friday. Please note that voicemails left after 4:00 p.m. may not be returned until the following business day.  We are closed weekends and major holidays. You have access to a nurse at all times for urgent questions. Please call the main number to the clinic Dept: [phone] and follow the prompts. ? ? ?For any non-urgent questions, you may also contact your provider using MyChart. We now offer e-Visits for anyone 105 and older to request care online for non-urgent symptoms. For details visit [email or web address]. ?  ?Also download the MyChart app! Go to the app store, search "MyChart", open the app, select [facility], and log in with your MyChart username and password. ? ?Due to Covid, a mask is required upon entering the hospital/clinic. If you do not have a mask, one will be given to you upon arrival. For doctor visits, patients may have 1 support person aged 94 or older with them. For treatment visits, patients cannot have anyone with them due to current Covid guidelines and our immunocompromised population.  ? ?

## 2022-01-01 ENCOUNTER — Telehealth: Payer: Self-pay | Admitting: Hematology

## 2022-01-01 NOTE — Telephone Encounter (Signed)
Scheduled follow-up appointment per 3/22 los. Patient is aware. ?

## 2022-01-02 ENCOUNTER — Other Ambulatory Visit: Payer: Self-pay

## 2022-01-02 ENCOUNTER — Inpatient Hospital Stay: Payer: 59

## 2022-01-02 VITALS — BP 136/79 | HR 96 | Temp 98.5°F | Resp 20

## 2022-01-02 DIAGNOSIS — C8512 Unspecified B-cell lymphoma, intrathoracic lymph nodes: Secondary | ICD-10-CM | POA: Diagnosis not present

## 2022-01-02 DIAGNOSIS — Z7189 Other specified counseling: Secondary | ICD-10-CM

## 2022-01-02 DIAGNOSIS — C8330 Diffuse large B-cell lymphoma, unspecified site: Secondary | ICD-10-CM

## 2022-01-02 MED ORDER — PEGFILGRASTIM-BMEZ 6 MG/0.6ML ~~LOC~~ SOSY
6.0000 mg | PREFILLED_SYRINGE | Freq: Once | SUBCUTANEOUS | Status: AC
  Administered 2022-01-02: 6 mg via SUBCUTANEOUS
  Filled 2022-01-02: qty 0.6

## 2022-01-07 ENCOUNTER — Encounter: Payer: Self-pay | Admitting: Hematology

## 2022-01-07 NOTE — Addendum Note (Signed)
Addended by: Sullivan Lone on: 01/07/2022 01:36 AM ? ? Modules accepted: Orders ? ?

## 2022-01-07 NOTE — Progress Notes (Addendum)
. ? ? ?HEMATOLOGY/ONCOLOGY CLINIC NOTE ? ?Date of Service: .12/31/2021 ? ? ?Patient Care Team: ?Pleas Koch, NP as PCP - General (Internal Medicine) ?Kristeen Miss, MD as Consulting Physician (Neurosurgery) ?Rolm Bookbinder, MD as Consulting Physician (General Surgery) ? ?CHIEF COMPLAINTS/PURPOSE OF CONSULTATION:  ?Follow-up for continued evaluation and management of large B-cell lymphoma and for cycle 6 of R-CHOP chemotherapy. ? ?HISTORY OF PRESENTING ILLNESS:  ?Please see previous note for details of initial presentation ? ?INTERVAL HISTORY ? ?.Virginia Oneal is here for continued valuation and management of her recently diagnosed large B-cell lymphoma and for cycle 6 of R-CHOP chemoimmunotherapy. ?She notes continued hair loss with grade 2 alopecia. ?Patient notes no new tingling or numbness in her hands or feet. ?No chest pain no shortness of breath no new issues with significant tachycardia. ?No chest pain or shortness of breath.  No new lumps or bumps. ? ?Labs done today were reviewed in detail with the patient. ? ? ?MEDICAL HISTORY:  ?Past Medical History:  ?Diagnosis Date  ? Abnormal uterine bleeding   ? Anemia   ? as a teenager  ? Anxiety   ? postpartum and hx panic attacks  ? Arrhythmia   ? Arthritis   ? back and hands  ? Chest mass   ? by CT scanning  ? Chest pain 07/26/2014  ? Complication of anesthesia   ? slow to wake up  ? COVID-19 10/2020  ? Depression   ? DOE (dyspnea on exertion) 07/26/2014  ? Family history of adverse reaction to anesthesia   ? mom slow to wake up  ? Gestational diabetes   ? resolved after pregnancy  ? History of kidney stones   ? Hypertension   ? Personal history of colonic polyps   ? PONV (postoperative nausea and vomiting)   ? PREMATURE ATRIAL CONTRACTIONS 08/15/2008  ? Qualifier: Diagnosis of  By: Council Mechanic MD, Hilaria Ota   ? RENAL CALCULUS, HX OF 08/15/2008  ? Qualifier: Diagnosis of  By: Zara Council LPN, Mariann Laster    ? Rotator cuff tendinitis 06/29/2011  ? Seasonal allergies    ? Urinary incontinence 05/2009  ? hx bladder sling--Dr. Quincy Simmonds  ? ? ?SURGICAL HISTORY: ?Past Surgical History:  ?Procedure Laterality Date  ? ABDOMINAL HYSTERECTOMY  05/2009  ? TAH/LSO  ? BLADDER SUSPENSION  2010  ? Dr. Quincy Simmonds  ? BREAST BIOPSY    ? left stereo  ? CESAREAN SECTION  03/2003  ? CESAREAN SECTION  2004  ? CHOLECYSTECTOMY N/A 01/02/2021  ? Procedure: LAPAROSCOPIC CHOLECYSTECTOMY;  Surgeon: Rolm Bookbinder, MD;  Location: [facility];  Service: General;  Laterality: N/A;  58  - GEN AND TAP BLOCK ?LDOW  ? COLONOSCOPY  09/08  ? COLPORRHAPHY  05/2009  ? Dr. Quincy Simmonds  ? ENDOMETRIAL ABLATION  2006  ? Dr. Quincy Simmonds  ? INCONTINENCE SURGERY    ? sling for urinary incontinence  ? IR IMAGING GUIDED PORT INSERTION  09/01/2021  ? KYPHOPLASTY N/A 04/07/2021  ? Procedure: Thoracic 12 Vertebral body biopsy;  Surgeon: Kristeen Miss, MD;  Location: [place];  Service: Neurosurgery;  Laterality: N/A;  ? LAPAROSCOPIC ENDOMETRIOSIS FULGURATION    ? ablation  ? LUMBAR LAMINECTOMY/DECOMPRESSION MICRODISCECTOMY N/A 07/31/2021  ? Procedure: Open Transpedicular biopsy of Thoracic twelve;  Surgeon: Kristeen Miss, MD;  Location: [place];  Service: Neurosurgery;  Laterality: N/A;  ? OOPHORECTOMY  2010  ? w/hyst--Dr. Quincy Simmonds  ? TUBAL LIGATION  2004  ? w/ C-section Dr. Quincy Simmonds  ? TYMPANOPLASTY    ?  right ear  ? VAGINAL HYSTERECTOMY  05/21/2009  ? lap assisted /partial  ? ? ?SOCIAL HISTORY: ?Social History  ? ?Socioeconomic History  ? Marital status: Married  ?  Spouse name: Not on file  ? Number of children: 2  ? Years of education: Not on file  ? Highest education level: Not on file  ?Occupational History  ? Occupation: [employer]  ?Tobacco Use  ? Smoking status: Former  ?  Years: 10.00  ?  Types: Cigarettes  ?  Quit date: 01/06/1999  ?  Years since quitting: 23.0  ? Smokeless tobacco: Never  ?Vaping Use  ? Vaping Use: Never used  ?Substance and Sexual Activity  ? Alcohol use: No  ?  Alcohol/week: 0.0 standard drinks  ? Drug use: No  ? Sexual  activity: Yes  ?  Partners: Male  ?  Birth control/protection: Other-see comments, Surgical  ?  Comment: TAH/LSO  ?Other Topics Concern  ? Not on file  ?Social History Narrative  ? ** Merged History Encounter **  ?    ? ?Social Determinants of Health  ? ?Financial Resource Strain: Not on file  ?Food Insecurity: Not on file  ?Transportation Needs: Not on file  ?Physical Activity: Not on file  ?Stress: Not on file  ?Social Connections: Not on file  ?Intimate Partner Violence: Not on file  ? ? ?FAMILY HISTORY: ?Family History  ?Problem Relation Age of Onset  ? Kidney disease Mother   ?     renal failure  ? Stroke Mother   ? Diabetes Mother   ? Hypertension Mother   ? Heart failure Mother   ? Colon cancer Mother   ? Stroke Father   ? Cancer - Colon Maternal Aunt   ? Hypertension Sister   ? Rheum arthritis Sister   ? Hypertension Brother   ? Hypertension Sister   ? Hypertension Sister   ? Hypertension Sister   ? Breast cancer Sister   ? Kidney cancer Sister   ? Esophageal cancer Neg Hx   ? Rectal cancer Neg Hx   ? Stomach cancer Neg Hx   ? ? ?ALLERGIES:  is allergic to buspar [buspirone hcl]. ? ?MEDICATIONS:  ?Current Outpatient Medications  ?Medication Sig Dispense Refill  ? acetaminophen (TYLENOL) 500 MG tablet Take 1,000 mg by mouth every 6 (six) hours as needed for moderate pain or headache.    ? b complex vitamins capsule Take 1 capsule by mouth daily.    ? betamethasone valerate ointment (VALISONE) 0.1 % Apply 1 application topically 2 (two) times daily. (Patient taking differently: Apply 1 application. topically 2 (two) times daily as needed (rash).) 45 g 1  ? cetirizine (ZYRTEC) 10 MG tablet Take 10 mg by mouth daily.     ? Cholecalciferol (VITAMIN D3) 125 MCG (5000 UT) CAPS Take 5,000 Units by mouth daily.    ? hydrochlorothiazide (HYDRODIURIL) 25 MG tablet Take 1 tablet (25 mg total) by mouth daily. For blood pressure. 90 tablet 3  ? HYDROcodone-acetaminophen (NORCO) 5-325 MG tablet Take 1-2 tablets by mouth  every 6 (six) hours as needed for moderate pain or severe pain. 10 tablet 0  ? hydrOXYzine (ATARAX/VISTARIL) 10 MG tablet Take 1 or 2 tablets by mouth as needed for anxiety attack. 30 tablet 0  ? KLOR-CON M20 20 MEQ tablet TAKE 1 TABLET BY MOUTH TWICE A DAY 30 tablet 0  ? lidocaine-prilocaine (EMLA) cream Apply to affected area once 30 g 3  ? Methylcobalamin (B-12) 5000 MCG  TBDP Take 5,000 mcg by mouth daily.    ? omeprazole (PRILOSEC) 40 MG capsule TAKE 1 CAPSULE (40 MG TOTAL) BY MOUTH DAILY. 90 capsule 1  ? ondansetron (ZOFRAN) 8 MG tablet Take 1 tablet (8 mg total) by mouth 2 (two) times daily as needed for refractory nausea / vomiting. Start on day 3 after cyclophosphamide chemotherapy. 30 tablet 1  ? predniSONE (DELTASONE) 20 MG tablet Take 3 tablets (60 mg total) by mouth daily. Take with food on days 1-5 of chemotherapy. 15 tablet 5  ? prochlorperazine (COMPAZINE) 10 MG tablet Take 1 tablet (10 mg total) by mouth every 6 (six) hours as needed (Nausea or vomiting). 30 tablet 6  ? venlafaxine XR (EFFEXOR-XR) 37.5 MG 24 hr capsule TAKE 1 CAPSULE (37.5 MG TOTAL) BY MOUTH DAILY WITH BREAKFAST. FOR HOT FLASHES AND ANXIETY 90 capsule 1  ? Biotin 10000 MCG TABS Take 10,000 mcg by mouth daily.    ? Black Cohosh 40 MG CAPS Take 40 mg by mouth daily.    ? COVID-19 mRNA bivalent vaccine, Moderna, (MODERNA COVID-19 BIVAL BOOSTER) 50 MCG/0.5ML injection Inject into the muscle. 0.5 mL 0  ? influenza vac split quadrivalent PF (FLUARIX) 0.5 ML injection Inject into the muscle. 0.5 mL 0  ? ketorolac (TORADOL) 10 MG tablet Take 1 tablet (10 mg total) by mouth every 6 (six) hours as needed for moderate pain or severe pain. 20 tablet 0  ? LORazepam (ATIVAN) 0.5 MG tablet Take 1 tablet (0.5 mg total) by mouth every 12 (twelve) hours as needed for anxiety. 30 tablet 1  ? Maca 500 MG CAPS Take 500 mg by mouth daily.    ? metoprolol succinate (TOPROL-XL) 25 MG 24 hr tablet TAKE 1 TABLET (25 MG TOTAL) BY MOUTH DAILY. (Patient not  taking: Reported on 12/31/2021) 90 tablet 1  ? Prenatal Vit-Fe Fumarate-FA (PRENATAL MULTIVITAMIN) TABS tablet Take 1 tablet by mouth daily.    ? Turmeric 500 MG CAPS Take 500 mg by mouth daily.    ? ?No current fa

## 2022-01-16 ENCOUNTER — Other Ambulatory Visit: Payer: Self-pay | Admitting: Hematology

## 2022-01-26 ENCOUNTER — Other Ambulatory Visit: Payer: Self-pay | Admitting: Primary Care

## 2022-01-26 DIAGNOSIS — R232 Flushing: Secondary | ICD-10-CM

## 2022-01-26 DIAGNOSIS — F419 Anxiety disorder, unspecified: Secondary | ICD-10-CM

## 2022-02-04 ENCOUNTER — Ambulatory Visit (HOSPITAL_COMMUNITY)
Admission: RE | Admit: 2022-02-04 | Discharge: 2022-02-04 | Disposition: A | Discharge status: Home / Self Care | Payer: 59 | Source: Ambulatory Visit | Attending: Hematology | Admitting: Hematology

## 2022-02-04 DIAGNOSIS — C8330 Diffuse large B-cell lymphoma, unspecified site: Secondary | ICD-10-CM | POA: Diagnosis present

## 2022-02-04 LAB — GLUCOSE, CAPILLARY: Glucose-Capillary: 117 mg/dL — ABNORMAL HIGH (ref 70–99)

## 2022-02-04 MED ORDER — FLUDEOXYGLUCOSE F - 18 (FDG) INJECTION
12.7500 | Freq: Once | INTRAVENOUS | Status: AC | PRN
  Administered 2022-02-04: 12.75 via INTRAVENOUS

## 2022-02-11 ENCOUNTER — Inpatient Hospital Stay: Payer: 59 | Attending: Hematology | Admitting: Hematology

## 2022-02-11 ENCOUNTER — Inpatient Hospital Stay: Payer: 59

## 2022-02-11 ENCOUNTER — Other Ambulatory Visit: Payer: Self-pay

## 2022-02-11 DIAGNOSIS — Z7189 Other specified counseling: Secondary | ICD-10-CM | POA: Diagnosis not present

## 2022-02-11 DIAGNOSIS — Z8616 Personal history of COVID-19: Secondary | ICD-10-CM | POA: Insufficient documentation

## 2022-02-11 DIAGNOSIS — D702 Other drug-induced agranulocytosis: Secondary | ICD-10-CM | POA: Diagnosis not present

## 2022-02-11 DIAGNOSIS — Z87891 Personal history of nicotine dependence: Secondary | ICD-10-CM | POA: Diagnosis not present

## 2022-02-11 DIAGNOSIS — C8512 Unspecified B-cell lymphoma, intrathoracic lymph nodes: Secondary | ICD-10-CM | POA: Diagnosis present

## 2022-02-11 DIAGNOSIS — Z800 Family history of malignant neoplasm of digestive organs: Secondary | ICD-10-CM | POA: Diagnosis not present

## 2022-02-11 DIAGNOSIS — C8330 Diffuse large B-cell lymphoma, unspecified site: Secondary | ICD-10-CM

## 2022-02-11 DIAGNOSIS — R599 Enlarged lymph nodes, unspecified: Secondary | ICD-10-CM | POA: Diagnosis not present

## 2022-02-11 DIAGNOSIS — I10 Essential (primary) hypertension: Secondary | ICD-10-CM | POA: Insufficient documentation

## 2022-02-11 DIAGNOSIS — Z95828 Presence of other vascular implants and grafts: Secondary | ICD-10-CM

## 2022-02-11 DIAGNOSIS — Z86010 Personal history of colonic polyps: Secondary | ICD-10-CM | POA: Diagnosis not present

## 2022-02-11 DIAGNOSIS — Z87442 Personal history of urinary calculi: Secondary | ICD-10-CM | POA: Diagnosis not present

## 2022-02-11 LAB — CBC WITH DIFFERENTIAL (CANCER CENTER ONLY)
Abs Immature Granulocytes: 0.01 10*3/uL (ref 0.00–0.07)
Basophils Absolute: 0 10*3/uL (ref 0.0–0.1)
Basophils Relative: 0 %
Eosinophils Absolute: 0.2 10*3/uL (ref 0.0–0.5)
Eosinophils Relative: 4 %
HCT: 34.7 % — ABNORMAL LOW (ref 36.0–46.0)
Hemoglobin: 11.9 g/dL — ABNORMAL LOW (ref 12.0–15.0)
Immature Granulocytes: 0 %
Lymphocytes Relative: 18 %
Lymphs Abs: 0.8 10*3/uL (ref 0.7–4.0)
MCH: 32.3 pg (ref 26.0–34.0)
MCHC: 34.3 g/dL (ref 30.0–36.0)
MCV: 94.3 fL (ref 80.0–100.0)
Monocytes Absolute: 0.4 10*3/uL (ref 0.1–1.0)
Monocytes Relative: 10 %
Neutro Abs: 3.1 10*3/uL (ref 1.7–7.7)
Neutrophils Relative %: 68 %
Platelet Count: 163 10*3/uL (ref 150–400)
RBC: 3.68 MIL/uL — ABNORMAL LOW (ref 3.87–5.11)
RDW: 12.3 % (ref 11.5–15.5)
WBC Count: 4.6 10*3/uL (ref 4.0–10.5)
nRBC: 0 % (ref 0.0–0.2)

## 2022-02-11 LAB — CMP (CANCER CENTER ONLY)
ALT: 39 U/L (ref 0–44)
AST: 32 U/L (ref 15–41)
Albumin: 3.9 g/dL (ref 3.5–5.0)
Alkaline Phosphatase: 85 U/L (ref 38–126)
Anion gap: 7 (ref 5–15)
BUN: 16 mg/dL (ref 6–20)
CO2: 27 mmol/L (ref 22–32)
Calcium: 9.2 mg/dL (ref 8.9–10.3)
Chloride: 103 mmol/L (ref 98–111)
Creatinine: 0.64 mg/dL (ref 0.44–1.00)
GFR, Estimated: >60 mL/min (ref >60)
Glucose, Bld: 160 mg/dL — ABNORMAL HIGH (ref 70–99)
Potassium: 3.5 mmol/L (ref 3.5–5.1)
Sodium: 137 mmol/L (ref 135–145)
Total Bilirubin: 0.5 mg/dL (ref 0.3–1.2)
Total Protein: 6.4 g/dL — ABNORMAL LOW (ref 6.5–8.1)

## 2022-02-11 LAB — LACTATE DEHYDROGENASE: LDH: 392 U/L — ABNORMAL HIGH (ref 98–192)

## 2022-02-11 MED ORDER — SODIUM CHLORIDE 0.9% FLUSH
10.0000 mL | Freq: Once | INTRAVENOUS | Status: AC
  Administered 2022-02-11: 10 mL

## 2022-02-11 MED ORDER — HEPARIN SOD (PORK) LOCK FLUSH 100 UNIT/ML IV SOLN
500.0000 [IU] | Freq: Once | INTRAVENOUS | Status: AC
  Administered 2022-02-11: 500 [IU]

## 2022-02-11 MED ORDER — LIDOCAINE-PRILOCAINE 2.5-2.5 % EX CREA: TOPICAL_CREAM | CUTANEOUS | 3 refills | Status: DC

## 2022-02-11 MED ORDER — AMOXICILLIN 500 MG PO TABS: 2000.0000 mg | ORAL_TABLET | Freq: Once | ORAL | 0 refills | Status: DC | PRN

## 2022-02-12 ENCOUNTER — Telehealth: Payer: Self-pay | Admitting: Hematology

## 2022-02-12 NOTE — Telephone Encounter (Signed)
Scheduled follow-up appointment per 5/3 los. Patient is aware. 

## 2022-02-17 ENCOUNTER — Encounter: Payer: Self-pay | Admitting: Hematology

## 2022-02-17 NOTE — Progress Notes (Signed)
. ? ? ?HEMATOLOGY/ONCOLOGY CLINIC NOTE ? ?Date of Service: .02/11/2022 ? ? ?Patient Care Team: ?Pleas Koch, NP as PCP - General (Internal Medicine) ?Kristeen Miss, MD as Consulting Physician (Neurosurgery) ?Rolm Bookbinder, MD as Consulting Physician (General Surgery) ? ?CHIEF COMPLAINTS/PURPOSE OF CONSULTATION:  ?Follow-up for continued evaluation and management of large B-cell lymphoma and for PET CT review after 6 cycles of R-CHOP chemotherapy ? ?HISTORY OF PRESENTING ILLNESS:  ?Please see previous note for details of initial presentation ? ?INTERVAL HISTORY ? ?.Virginia Oneal is here for follow-up regarding her large B-cell lymphoma after having completed 6 cycles of planned chemoimmunotherapy and to review her PET CT scan. ?She notes that her fatigue has been gradually improving.  Minimal lower extremity swelling.  No fevers no chills no night sweats no new lumps or bumps. ?Improved p.o. intake. ?No abnormal bleeding or bruising. ?Labs done today were reviewed in detail. ?PET CT scan done 02/04/2022 was discussed in detail-shows no hypermetabolic lymphadenopathy above or below the diaphragm and no splenomegaly.  Similar FDG avidity in the T12 vertebral body reflecting changes related to previous biopsy of T12 vertebral body lesion. ?We discussed in detail and patient is agreeable with referral to radiation oncology for consideration of involved site radiation to her isolated skeletal lesion. ? ?MEDICAL HISTORY:  ?Past Medical History:  ?Diagnosis Date  ? Abnormal uterine bleeding   ? Anemia   ? as a teenager  ? Anxiety   ? postpartum and hx panic attacks  ? Arrhythmia   ? Arthritis   ? back and hands  ? Chest mass   ? by CT scanning  ? Chest pain 07/26/2014  ? Complication of anesthesia   ? slow to wake up  ? COVID-19 10/2020  ? Depression   ? DOE (dyspnea on exertion) 07/26/2014  ? Family history of adverse reaction to anesthesia   ? mom slow to wake up  ? Gestational diabetes   ? resolved after  pregnancy  ? History of kidney stones   ? Hypertension   ? Personal history of colonic polyps   ? PONV (postoperative nausea and vomiting)   ? PREMATURE ATRIAL CONTRACTIONS 08/15/2008  ? Qualifier: Diagnosis of  By: Council Mechanic MD, Hilaria Ota   ? RENAL CALCULUS, HX OF 08/15/2008  ? Qualifier: Diagnosis of  By: Zara Council LPN, Mariann Laster    ? Rotator cuff tendinitis 06/29/2011  ? Seasonal allergies   ? Urinary incontinence 05/2009  ? hx bladder sling--Dr. Quincy Simmonds  ? ? ?SURGICAL HISTORY: ?Past Surgical History:  ?Procedure Laterality Date  ? ABDOMINAL HYSTERECTOMY  05/2009  ? TAH/LSO  ? BLADDER SUSPENSION  2010  ? Dr. Quincy Simmonds  ? BREAST BIOPSY    ? left stereo  ? CESAREAN SECTION  03/2003  ? CESAREAN SECTION  2004  ? CHOLECYSTECTOMY N/A 01/02/2021  ? Procedure: LAPAROSCOPIC CHOLECYSTECTOMY;  Surgeon: Rolm Bookbinder, MD;  Location: [facility];  Service: General;  Laterality: N/A;  58  - GEN AND TAP BLOCK ?LDOW  ? COLONOSCOPY  09/08  ? COLPORRHAPHY  05/2009  ? Dr. Quincy Simmonds  ? ENDOMETRIAL ABLATION  2006  ? Dr. Quincy Simmonds  ? INCONTINENCE SURGERY    ? sling for urinary incontinence  ? IR IMAGING GUIDED PORT INSERTION  09/01/2021  ? KYPHOPLASTY N/A 04/07/2021  ? Procedure: Thoracic 12 Vertebral body biopsy;  Surgeon: Kristeen Miss, MD;  Location: [place];  Service: Neurosurgery;  Laterality: N/A;  ? LAPAROSCOPIC ENDOMETRIOSIS FULGURATION    ? ablation  ? LUMBAR LAMINECTOMY/DECOMPRESSION MICRODISCECTOMY  N/A 07/31/2021  ? Procedure: Open Transpedicular biopsy of Thoracic twelve;  Surgeon: Kristeen Miss, MD;  Location: [place];  Service: Neurosurgery;  Laterality: N/A;  ? OOPHORECTOMY  2010  ? w/hyst--Dr. Quincy Simmonds  ? TUBAL LIGATION  2004  ? w/ C-section Dr. Quincy Simmonds  ? TYMPANOPLASTY    ? right ear  ? VAGINAL HYSTERECTOMY  05/21/2009  ? lap assisted /partial  ? ? ?SOCIAL HISTORY: ?Social History  ? ?Socioeconomic History  ? Marital status: Married  ?  Spouse name: Not on file  ? Number of children: 2  ? Years of education: Not on file  ? Highest education level: Not on  file  ?Occupational History  ? Occupation: [employer]  ?Tobacco Use  ? Smoking status: Former  ?  Years: 10.00  ?  Types: Cigarettes  ?  Quit date: 01/06/1999  ?  Years since quitting: 23.1  ? Smokeless tobacco: Never  ?Vaping Use  ? Vaping Use: Never used  ?Substance and Sexual Activity  ? Alcohol use: No  ?  Alcohol/week: 0.0 standard drinks  ? Drug use: No  ? Sexual activity: Yes  ?  Partners: Male  ?  Birth control/protection: Other-see comments, Surgical  ?  Comment: TAH/LSO  ?Other Topics Concern  ? Not on file  ?Social History Narrative  ? ** Merged History Encounter **  ?    ? ?Social Determinants of Health  ? ?Financial Resource Strain: Not on file  ?Food Insecurity: Not on file  ?Transportation Needs: Not on file  ?Physical Activity: Not on file  ?Stress: Not on file  ?Social Connections: Not on file  ?Intimate Partner Violence: Not on file  ? ? ?FAMILY HISTORY: ?Family History  ?Problem Relation Age of Onset  ? Kidney disease Mother   ?     renal failure  ? Stroke Mother   ? Diabetes Mother   ? Hypertension Mother   ? Heart failure Mother   ? Colon cancer Mother   ? Stroke Father   ? Cancer - Colon Maternal Aunt   ? Hypertension Sister   ? Rheum arthritis Sister   ? Hypertension Brother   ? Hypertension Sister   ? Hypertension Sister   ? Hypertension Sister   ? Breast cancer Sister   ? Kidney cancer Sister   ? Esophageal cancer Neg Hx   ? Rectal cancer Neg Hx   ? Stomach cancer Neg Hx   ? ? ?ALLERGIES:  is allergic to buspar [buspirone hcl]. ? ?MEDICATIONS:  ?Current Outpatient Medications  ?Medication Sig Dispense Refill  ? amoxicillin (AMOXIL) 500 MG tablet Take 4 tablets (2,000 mg total) by mouth once as needed for up to 1 dose (1 to 2 hours prior to dental procedure). 4 tablet 0  ? acetaminophen (TYLENOL) 500 MG tablet Take 1,000 mg by mouth every 6 (six) hours as needed for moderate pain or headache.    ? b complex vitamins capsule Take 1 capsule by mouth daily.    ? cetirizine (ZYRTEC) 10 MG  tablet Take 10 mg by mouth daily.     ? Cholecalciferol (VITAMIN D3) 125 MCG (5000 UT) CAPS Take 5,000 Units by mouth daily.    ? COVID-19 mRNA bivalent vaccine, Moderna, (MODERNA COVID-19 BIVAL BOOSTER) 50 MCG/0.5ML injection Inject into the muscle. 0.5 mL 0  ? hydrochlorothiazide (HYDRODIURIL) 25 MG tablet Take 1 tablet (25 mg total) by mouth daily. For blood pressure. 90 tablet 3  ? hydrOXYzine (ATARAX/VISTARIL) 10 MG tablet Take 1 or  2 tablets by mouth as needed for anxiety attack. 30 tablet 0  ? influenza vac split quadrivalent PF (FLUARIX) 0.5 ML injection Inject into the muscle. 0.5 mL 0  ? KLOR-CON M20 20 MEQ tablet TAKE 1 TABLET BY MOUTH TWICE A DAY 180 tablet 1  ? lidocaine-prilocaine (EMLA) cream Apply to affected area once 30 g 3  ? LORazepam (ATIVAN) 0.5 MG tablet Take 1 tablet (0.5 mg total) by mouth every 12 (twelve) hours as needed for anxiety. 30 tablet 1  ? Methylcobalamin (B-12) 5000 MCG TBDP Take 5,000 mcg by mouth daily.    ? omeprazole (PRILOSEC) 40 MG capsule TAKE 1 CAPSULE (40 MG TOTAL) BY MOUTH DAILY. 90 capsule 1  ? ondansetron (ZOFRAN) 8 MG tablet Take 1 tablet (8 mg total) by mouth 2 (two) times daily as needed for refractory nausea / vomiting. Start on day 3 after cyclophosphamide chemotherapy. 30 tablet 1  ? prochlorperazine (COMPAZINE) 10 MG tablet Take 1 tablet (10 mg total) by mouth every 6 (six) hours as needed (Nausea or vomiting). 30 tablet 6  ? venlafaxine XR (EFFEXOR-XR) 37.5 MG 24 hr capsule Take 1 capsule (37.5 mg total) by mouth daily with breakfast. For hot flashes and anxiety. Due in July for office visit. 90 capsule 0  ? ?No current facility-administered medications for this visit.  ? ? ?REVIEW OF SYSTEMS:   ?10 Point review of Systems was done is negative except as noted above. ? ?PHYSICAL EXAMINATION: ?.BP (!) 169/84 (BP Location: Left Arm, Patient Position: Sitting) Comment: nurse notified  Pulse 98   Temp 97.8 ?F (36.6 ?C) (Temporal)   Resp 18   Ht '5\' 4"'$  (1.626 m)    Wt 254 lb 14.4 oz (115.6 kg)   SpO2 100%   BMI 43.75 kg/m?  ?NAD ?GENERAL:alert, in no acute distress and comfortable ?SKIN: no acute rashes, no significant lesions ?EYES: conjunctiva are pink and non-inj

## 2022-02-19 ENCOUNTER — Other Ambulatory Visit: Payer: Self-pay | Admitting: Hematology

## 2022-02-19 DIAGNOSIS — C8330 Diffuse large B-cell lymphoma, unspecified site: Secondary | ICD-10-CM

## 2022-03-02 NOTE — Progress Notes (Signed)
Histology and Location of Primary Cancer: Stage IVA high-grade large B cell non-Hodgkin's Lymphoma  Location(s) of Symptomatic Metastases: T12  PET 02/04/2022:  Similar hypermetabolic activity associated with the tracks in the bilateral T12 pedicles crossing into the vertebral body with similar mild uptake in the right paraspinal musculature, likely reflecting sequela prior biopsy with a max SUV of 3.6 previously 3.4 along the right pedicle. As well there is similar increased metabolic activity in the M40 vertebral body slightly asymmetric to the left with a max SUV of 4.01 previously 4.2  Past/Anticipated chemotherapy by medical oncology, if any:  Dr. Irene Limbo 02/11/2022 -PET CT scan done 02/04/2022 was reviewed in detail with the patient.  No evidence of hypermetabolic lymphadenopathy above or below the diaphragm.  FDG avidity at T12 likely related to her previous procedure. -Discussed with patient and will refer her to radiation oncology for consideration of involved site radiation therapy to the T12 lesion. -We shall plan to see her back in 2 months. -Completed R-CHOP chemotherapy.   Pain on a scale of 0-10 is:     If Spine Met(s), symptoms, if any, include: Bowel/Bladder retention or incontinence (please describe):  Numbness or weakness in extremities (please describe):  Current Decadron regimen, if applicable:   Ambulatory status? Walker? Wheelchair?:   SAFETY ISSUES: Prior radiation?  Pacemaker/ICD?  Possible current pregnancy? Hysterectomy Is the patient on methotrexate?   Current Complaints / other details:

## 2022-03-03 ENCOUNTER — Ambulatory Visit
Admission: RE | Admit: 2022-03-03 | Discharge: 2022-03-03 | Disposition: A | Discharge status: Home / Self Care | Payer: 59 | Source: Ambulatory Visit | Attending: Radiation Oncology | Admitting: Radiation Oncology

## 2022-03-03 ENCOUNTER — Other Ambulatory Visit: Payer: Self-pay

## 2022-03-03 ENCOUNTER — Encounter: Payer: Self-pay | Admitting: Hematology

## 2022-03-03 ENCOUNTER — Encounter: Payer: Self-pay | Admitting: Radiation Oncology

## 2022-03-03 VITALS — BP 160/100 | HR 93 | Temp 96.3°F | Resp 18 | Ht 64.0 in | Wt 251.1 lb

## 2022-03-03 DIAGNOSIS — Z8616 Personal history of COVID-19: Secondary | ICD-10-CM | POA: Diagnosis not present

## 2022-03-03 DIAGNOSIS — Z803 Family history of malignant neoplasm of breast: Secondary | ICD-10-CM | POA: Insufficient documentation

## 2022-03-03 DIAGNOSIS — C7951 Secondary malignant neoplasm of bone: Secondary | ICD-10-CM

## 2022-03-03 DIAGNOSIS — Z79899 Other long term (current) drug therapy: Secondary | ICD-10-CM | POA: Diagnosis not present

## 2022-03-03 DIAGNOSIS — Z87442 Personal history of urinary calculi: Secondary | ICD-10-CM | POA: Diagnosis not present

## 2022-03-03 DIAGNOSIS — Z87891 Personal history of nicotine dependence: Secondary | ICD-10-CM | POA: Insufficient documentation

## 2022-03-03 DIAGNOSIS — Z86010 Personal history of colonic polyps: Secondary | ICD-10-CM | POA: Insufficient documentation

## 2022-03-03 DIAGNOSIS — Z800 Family history of malignant neoplasm of digestive organs: Secondary | ICD-10-CM | POA: Insufficient documentation

## 2022-03-03 DIAGNOSIS — I700 Atherosclerosis of aorta: Secondary | ICD-10-CM | POA: Insufficient documentation

## 2022-03-03 DIAGNOSIS — K760 Fatty (change of) liver, not elsewhere classified: Secondary | ICD-10-CM | POA: Insufficient documentation

## 2022-03-03 DIAGNOSIS — C8339 Diffuse large B-cell lymphoma, extranodal and solid organ sites: Secondary | ICD-10-CM | POA: Diagnosis present

## 2022-03-03 DIAGNOSIS — I10 Essential (primary) hypertension: Secondary | ICD-10-CM | POA: Diagnosis not present

## 2022-03-03 NOTE — Progress Notes (Signed)
Radiation Oncology         [phone] ________________________________  Name: Virginia Oneal        MRN: [id number]  Date of Service: 03/03/2022 DOB: [birth year]  FA:OZHYQ, Leticia Penna, NP  Brunetta Genera, MD     REFERRING PHYSICIAN: Brunetta Genera, MD   DIAGNOSIS: The encounter diagnosis was Secondary malignant neoplasm of bone [facility]).   HISTORY OF PRESENT ILLNESS: Virginia Oneal is a 56 y.o. female seen at the request of Dr. Irene Limbo for a history of Stage IV B Cell Lymphoma diagnosed in the fall of 2022. She had Been dealing with back pain prior to the and was seen by neurosurgery in early 2022 and an MRI of the spine of the thoracic level showed T signal abnormality in T12 vertebral body.  A repeat MRI on 03/02/2021 showed decreased T1 and T2 signal in the T12 vertebral body with sclerosis new since 2020 with an enhancing extraosseous component mildly projecting into the ventral epidural space.  She underwent biopsy of the T12 vertebral body on 04/07/2021 and no significant blastic population or B-cell population was noted no malignant cells were seen in the fine-needle aspirate.  She continued to have symptoms and ultimately a biopsy again of the T12 vertebral body on 07/31/2021 showed non-Hodgkin's B cell lymphoma high-grade.  Pet imaging in November 2022 showed scattered mild hypermetabolic activity in the periportal, retroperitoneal, and mesenteric lymph nodes, hypermetabolism was seen in T7 and T12 and focal hypermetabolism in the acromion and the right scapula without a significant CT correlate was noted.  She received 6 cycles of R-CHOP chemotherapy with Dr. Jenell Milliner which she completed on 12/31/2021.  Posttreatment PET on 02/04/2022 shows no hypermetabolic lymphadenopathy, similar FDG avidity in the T12 vertebral body and pedicles with persistent metabolic change reflecting prior biopsy.  Interval decrease in mild diffuse marrow uptake reflecting marrow stimulation.  She is seen to  consider involved site radiotherapy to T12.    PREVIOUS RADIATION THERAPY: No   PAST MEDICAL HISTORY:  Past Medical History:  Diagnosis Date   Abnormal uterine bleeding    Anemia    as a teenager   Anxiety    postpartum and hx panic attacks   Arrhythmia    Arthritis    back and hands   Chest mass    by CT scanning   Chest pain 65/78/4696   Complication of anesthesia    slow to wake up   COVID-19 10/2020   Depression    DOE (dyspnea on exertion) 07/26/2014   Family history of adverse reaction to anesthesia    mom slow to wake up   Gestational diabetes    resolved after pregnancy   History of kidney stones    Hypertension    Personal history of colonic polyps    PONV (postoperative nausea and vomiting)    PREMATURE ATRIAL CONTRACTIONS 08/15/2008   Qualifier: Diagnosis of  By: Council Mechanic MD, Hilaria Ota    RENAL CALCULUS, HX OF 08/15/2008   Qualifier: Diagnosis of  By: Zara Council LPN, Wanda     Rotator cuff tendinitis 06/29/2011   Seasonal allergies    Urinary incontinence 05/2009   hx bladder sling--Dr. Quincy Simmonds       PAST SURGICAL HISTORY: Past Surgical History:  Procedure Laterality Date   ABDOMINAL HYSTERECTOMY  05/2009   TAH/LSO   BLADDER SUSPENSION  2010   Dr. Quincy Simmonds   BREAST BIOPSY     left stereo   CESAREAN SECTION  03/2003  CESAREAN SECTION  2004   CHOLECYSTECTOMY N/A 01/02/2021   Procedure: LAPAROSCOPIC CHOLECYSTECTOMY;  Surgeon: Rolm Bookbinder, MD;  Location: [facility];  Service: General;  Laterality: N/A;  58  - GEN AND TAP BLOCK LDOW   COLONOSCOPY  09/08   COLPORRHAPHY  05/2009   Dr. Quincy Simmonds   ENDOMETRIAL ABLATION  2006   Dr. Quincy Simmonds   INCONTINENCE SURGERY     sling for urinary incontinence   IR IMAGING GUIDED PORT INSERTION  09/01/2021   KYPHOPLASTY N/A 04/07/2021   Procedure: Thoracic 12 Vertebral body biopsy;  Surgeon: Kristeen Miss, MD;  Location: [facility];  Service: Neurosurgery;  Laterality: N/A;   LAPAROSCOPIC ENDOMETRIOSIS FULGURATION     ablation    LUMBAR LAMINECTOMY/DECOMPRESSION MICRODISCECTOMY N/A 07/31/2021   Procedure: Open Transpedicular biopsy of Thoracic twelve;  Surgeon: Kristeen Miss, MD;  Location: [place];  Service: Neurosurgery;  Laterality: N/A;   OOPHORECTOMY  2010   w/hyst--Dr. Quincy Simmonds   TUBAL LIGATION  2004   w/ C-section Dr. Quincy Simmonds   TYMPANOPLASTY     right ear   VAGINAL HYSTERECTOMY  05/21/2009   lap assisted /partial     FAMILY HISTORY:  Family History  Problem Relation Age of Onset   Kidney disease Mother        renal failure   Stroke Mother    Diabetes Mother    Hypertension Mother    Heart failure Mother    Colon cancer Mother    Stroke Father    Cancer - Colon Maternal Aunt    Hypertension Sister    Rheum arthritis Sister    Hypertension Brother    Hypertension Sister    Hypertension Sister    Hypertension Sister    Breast cancer Sister    Kidney cancer Sister    Esophageal cancer Neg Hx    Rectal cancer Neg Hx    Stomach cancer Neg Hx      SOCIAL HISTORY:  reports that she quit smoking about 23 years ago. Her smoking use included cigarettes. She has never used smokeless tobacco. She reports that she does not drink alcohol and does not use drugs.  The patient is married and lives in [place]. She is a Agricultural engineer.   ALLERGIES: Buspar [buspirone hcl]   MEDICATIONS:  Current Outpatient Medications  Medication Sig Dispense Refill   acetaminophen (TYLENOL) 500 MG tablet Take 1,000 mg by mouth every 6 (six) hours as needed for moderate pain or headache.     b complex vitamins capsule Take 1 capsule by mouth daily.     cetirizine (ZYRTEC) 10 MG tablet Take 10 mg by mouth daily.      Cholecalciferol (VITAMIN D3) 125 MCG (5000 UT) CAPS Take 5,000 Units by mouth daily.     COVID-19 mRNA bivalent vaccine, Moderna, (MODERNA COVID-19 BIVAL BOOSTER) 50 MCG/0.5ML injection Inject into the muscle. 0.5 mL 0   hydrochlorothiazide (HYDRODIURIL) 25 MG tablet Take 1 tablet (25 mg total) by mouth daily. For  blood pressure. 90 tablet 3   hydrOXYzine (ATARAX/VISTARIL) 10 MG tablet Take 1 or 2 tablets by mouth as needed for anxiety attack. 30 tablet 0   KLOR-CON M20 20 MEQ tablet TAKE 1 TABLET BY MOUTH TWICE A DAY 180 tablet 1   lidocaine-prilocaine (EMLA) cream Apply to affected area once 30 g 3   Methylcobalamin (B-12) 5000 MCG TBDP Take 5,000 mcg by mouth daily.     omeprazole (PRILOSEC) 40 MG capsule TAKE 1 CAPSULE (40 MG TOTAL) BY MOUTH DAILY. [place]  capsule 1   ondansetron (ZOFRAN) 8 MG tablet Take 1 tablet (8 mg total) by mouth 2 (two) times daily as needed for refractory nausea / vomiting. Start on day 3 after cyclophosphamide chemotherapy. 30 tablet 1   prochlorperazine (COMPAZINE) 10 MG tablet Take 1 tablet (10 mg total) by mouth every 6 (six) hours as needed (Nausea or vomiting). 30 tablet 6   venlafaxine XR (EFFEXOR-XR) 37.5 MG 24 hr capsule Take 1 capsule (37.5 mg total) by mouth daily with breakfast. For hot flashes and anxiety. Due in July for office visit. 90 capsule 0   amoxicillin (AMOXIL) 500 MG tablet Take 4 tablets (2,000 mg total) by mouth once as needed for up to 1 dose (1 to 2 hours prior to dental procedure). (Patient not taking: Reported on 03/03/2022) 4 tablet 0   No current facility-administered medications for this encounter.     REVIEW OF SYSTEMS: On review of systems, the patient reports that she is doing okay. She reports discomfort in her low back at times but much better than prior to her chemotherapy. She reports aches in her hands and right foot but has required injections in the past for this. She has been tired but no other complaints are verbalized.      PHYSICAL EXAM:  Wt Readings from Last 3 Encounters:  03/03/22 251 lb 2 oz (113.9 kg)  02/11/22 254 lb 14.4 oz (115.6 kg)  12/31/21 257 lb (116.6 kg)   Temp Readings from Last 3 Encounters:  03/03/22 (!) 96.3 F (35.7 C) (Temporal)  02/11/22 97.8 F (36.6 C) (Temporal)  01/02/22 98.5 F (36.9 C) (Oral)   BP  Readings from Last 3 Encounters:  03/03/22 (!) 160/100  02/11/22 (!) 169/84  01/02/22 136/79   Pulse Readings from Last 3 Encounters:  03/03/22 93  02/11/22 98  01/02/22 96   Pain Assessment Pain Score: 0-No pain/10  In general this is a well appearing caucasian female in no acute distress. She's alert and oriented x4 and appropriate throughout the examination. Cardiopulmonary assessment is negative for acute distress and she exhibits normal effort.     ECOG = 1  0 - Asymptomatic (Fully active, able to carry on all predisease activities without restriction)  1 - Symptomatic but completely ambulatory (Restricted in physically strenuous activity but ambulatory and able to carry out work of a light or sedentary nature. For example, light housework, office work)  2 - Symptomatic, <50% in bed during the day (Ambulatory and capable of all self care but unable to carry out any work activities. Up and about more than 50% of waking hours)  3 - Symptomatic, >50% in bed, but not bedbound (Capable of only limited self-care, confined to bed or chair 50% or more of waking hours)  4 - Bedbound (Completely disabled. Cannot carry on any self-care. Totally confined to bed or chair)  5 - Death   Eustace Pen MM, Creech RH, Tormey DC, et al. [phone]). "Toxicity and response criteria of the [facility]". [place] Oncol. 5 (6): 649-55    LABORATORY DATA:  Lab Results  Component Value Date   WBC 4.6 02/11/2022   HGB 11.9 (L) 02/11/2022   HCT 34.7 (L) 02/11/2022   MCV 94.3 02/11/2022   PLT 163 02/11/2022   Lab Results  Component Value Date   NA 137 02/11/2022   K 3.5 02/11/2022   CL 103 02/11/2022   CO2 27 02/11/2022   Lab Results  Component Value Date  ALT 39 02/11/2022   AST 32 02/11/2022   ALKPHOS 85 02/11/2022   BILITOT 0.5 02/11/2022      RADIOGRAPHY: NM PET Image Restag (PS) Skull Base To Thigh  Result Date: 02/04/2022 CLINICAL DATA:  Subsequent treatment  strategy for diffuse large B-cell lymphoma. EXAM: NUCLEAR MEDICINE PET SKULL BASE TO THIGH TECHNIQUE: 12.75 mCi F-18 FDG was injected intravenously. Full-ring PET imaging was performed from the skull base to thigh after the radiotracer. CT data was obtained and used for attenuation correction and anatomic localization. Fasting blood glucose: 117 mg/dl COMPARISON:  Multiple priors including most recent PET-CT November 12, 2021 FINDINGS: Mediastinal blood pool activity: SUV max 2.19 Liver activity: SUV max 3.66 NECK: No hypermetabolic cervical adenopathy. Incidental CT findings: No pathologically enlarged cervical lymph nodes. Streak artifact from dental hardware. CHEST: No hypermetabolic thoracic adenopathy or suspicious pulmonary nodules. Incidental CT findings: Right chest wall Port-A-Cath with tip at the superior cavoatrial junction. Mitral annular calcifications. Normal size heart. Aortic atherosclerosis. No suspicious pulmonary nodules or masses. Soft tissue density along the left paraspinous region again does not demonstrate abnormal hypermetabolic activity is similar in size over multiple prior studies measuring 14 x 10 mm on image 76/4. ABDOMEN/PELVIS: No abnormal hypermetabolic activity within the liver, pancreas, adrenal glands, or spleen. No pathologically enlarged hypermetabolic abdominal or pelvic lymph nodes. Previously indexed periportal lymph node does not demonstrate significant abnormal FDG avidity and measures 6 mm on image 126/4 unchanged in size. Incidental CT findings: Hepatic steatosis. Hepatic cysts. Gallbladder surgically absent. No hydronephrosis. Nonobstructive right upper pole renal stones. No bowel obstruction. SKELETON: Similar hypermetabolic activity associated with the tracks in the bilateral T12 pedicles crossing into the vertebral body with similar mild uptake in the right paraspinal musculature, likely reflecting sequela prior biopsy with a max SUV of 3.6 previously 3.4 along the  right pedicle. As well there is similar increased metabolic activity in the H96 vertebral body slightly asymmetric to the left with a max SUV of 4.01 previously 4.2. Mild diffuse marrow uptake is decreased from prior and most likely reflects marrow stimulation. Incidental CT findings: none IMPRESSION: 1. No hypermetabolic lymphadenopathy above or below the diaphragm and no splenomegaly. 2. Similar FDG avidity in the T12 vertebral body and pedicles, with the persistent metabolic activity possibly reflecting change related to the prior biopsy of the T12 vertebral body lesion. Attention on follow-up imaging is suggested. 3. Interval decrease in the now mild diffuse marrow uptake, most likely reflecting marrow stimulation. 4.  Hepatic steatosis. 5.  Nonobstructive right nephrolithiasis. Electronically Signed   By: Dahlia Bailiff M.D.   On: 02/04/2022 14:56       IMPRESSION/PLAN: 1. Stage IV B Cell Lymphoma. Dr. Lisbeth Renshaw discusses the pathology findings and reviews the nature of lymphomatous disease and the rationale for radiotherapy in sites of prior bulky disease especially given the proximity to her spinal canal. He recommends a course of palliative radiotherapy, with goals of being locally aggressive to reduce risks of recurrence. We discussed the risks, benefits, short, and long term effects of radiotherapy, as well as the curative intent, and the patient is interested in proceeding. Dr. Lisbeth Renshaw discusses the delivery and logistics of radiotherapy and anticipates a course of 2 weeks of radiotherapy to the T12 vertebral body. Written consent is obtained and placed in the chart, a copy was provided to the patient. The patient will be contacted to coordinate treatment planning by our simulation department.    In a visit lasting 60 minutes, greater than 50%  of the time was spent face to face discussing the patient's condition, in preparation for the discussion, and coordinating the patient's care.  The above  documentation reflects my direct findings during this shared patient visit. Please see the separate note by Dr. Lisbeth Renshaw on this date for the remainder of the patient's plan of care.    Carola Rhine, [facility]   **Disclaimer: This note was dictated with voice recognition software. Similar sounding words can inadvertently be transcribed and this note may contain transcription errors which may not have been corrected upon publication of note.**

## 2022-03-11 ENCOUNTER — Other Ambulatory Visit: Payer: Self-pay

## 2022-03-11 ENCOUNTER — Ambulatory Visit
Admission: RE | Admit: 2022-03-11 | Discharge: 2022-03-11 | Disposition: A | Discharge status: Home / Self Care | Payer: 59 | Source: Ambulatory Visit | Attending: Radiation Oncology | Admitting: Radiation Oncology

## 2022-03-11 DIAGNOSIS — C8332 Diffuse large B-cell lymphoma, intrathoracic lymph nodes: Secondary | ICD-10-CM | POA: Diagnosis present

## 2022-03-11 DIAGNOSIS — Z510 Encounter for antineoplastic radiation therapy: Secondary | ICD-10-CM | POA: Insufficient documentation

## 2022-03-17 ENCOUNTER — Ambulatory Visit: Payer: 59 | Admitting: Radiation Oncology

## 2022-03-18 ENCOUNTER — Ambulatory Visit: Payer: 59

## 2022-03-18 ENCOUNTER — Ambulatory Visit: Payer: 59 | Admitting: Radiation Oncology

## 2022-03-18 ENCOUNTER — Other Ambulatory Visit: Payer: Self-pay | Admitting: Oncology

## 2022-03-18 DIAGNOSIS — C8332 Diffuse large B-cell lymphoma, intrathoracic lymph nodes: Secondary | ICD-10-CM | POA: Insufficient documentation

## 2022-03-18 DIAGNOSIS — Z510 Encounter for antineoplastic radiation therapy: Secondary | ICD-10-CM | POA: Insufficient documentation

## 2022-03-19 ENCOUNTER — Other Ambulatory Visit: Payer: Self-pay

## 2022-03-19 ENCOUNTER — Ambulatory Visit
Admission: RE | Admit: 2022-03-19 | Discharge: 2022-03-19 | Disposition: A | Discharge status: Home / Self Care | Payer: 59 | Source: Ambulatory Visit | Attending: Radiation Oncology | Admitting: Radiation Oncology

## 2022-03-19 DIAGNOSIS — C8332 Diffuse large B-cell lymphoma, intrathoracic lymph nodes: Secondary | ICD-10-CM | POA: Diagnosis not present

## 2022-03-19 LAB — RAD ONC ARIA SESSION SUMMARY
Course Elapsed Days: 0
Plan Fractions Treated to Date: 1
Plan Prescribed Dose Per Fraction: 2.5 Gy
Plan Total Fractions Prescribed: 10
Plan Total Prescribed Dose: 25 Gy
Reference Point Dosage Given to Date: 2.5 Gy
Reference Point Session Dosage Given: 2.5 Gy
Session Number: 1

## 2022-03-20 ENCOUNTER — Ambulatory Visit
Admission: RE | Admit: 2022-03-20 | Discharge: 2022-03-20 | Disposition: A | Discharge status: Home / Self Care | Payer: 59 | Source: Ambulatory Visit | Attending: Radiation Oncology | Admitting: Radiation Oncology

## 2022-03-20 ENCOUNTER — Other Ambulatory Visit: Payer: Self-pay

## 2022-03-20 DIAGNOSIS — C8332 Diffuse large B-cell lymphoma, intrathoracic lymph nodes: Secondary | ICD-10-CM | POA: Diagnosis not present

## 2022-03-20 LAB — RAD ONC ARIA SESSION SUMMARY
Course Elapsed Days: 1
Plan Fractions Treated to Date: 2
Plan Prescribed Dose Per Fraction: 2.5 Gy
Plan Total Fractions Prescribed: 10
Plan Total Prescribed Dose: 25 Gy
Reference Point Dosage Given to Date: 5 Gy
Reference Point Session Dosage Given: 2.5 Gy
Session Number: 2

## 2022-03-23 ENCOUNTER — Other Ambulatory Visit: Payer: Self-pay

## 2022-03-23 ENCOUNTER — Ambulatory Visit
Admission: RE | Admit: 2022-03-23 | Discharge: 2022-03-23 | Disposition: A | Discharge status: Home / Self Care | Payer: 59 | Source: Ambulatory Visit | Attending: Radiation Oncology | Admitting: Radiation Oncology

## 2022-03-23 DIAGNOSIS — C8332 Diffuse large B-cell lymphoma, intrathoracic lymph nodes: Secondary | ICD-10-CM | POA: Diagnosis not present

## 2022-03-23 LAB — RAD ONC ARIA SESSION SUMMARY
Course Elapsed Days: 4
Plan Fractions Treated to Date: 3
Plan Prescribed Dose Per Fraction: 2.5 Gy
Plan Total Fractions Prescribed: 10
Plan Total Prescribed Dose: 25 Gy
Reference Point Dosage Given to Date: 7.5 Gy
Reference Point Session Dosage Given: 2.5 Gy
Session Number: 3

## 2022-03-24 ENCOUNTER — Other Ambulatory Visit: Payer: Self-pay

## 2022-03-24 ENCOUNTER — Ambulatory Visit
Admission: RE | Admit: 2022-03-24 | Discharge: 2022-03-24 | Disposition: A | Discharge status: Home / Self Care | Payer: 59 | Source: Ambulatory Visit | Attending: Radiation Oncology | Admitting: Radiation Oncology

## 2022-03-24 DIAGNOSIS — C8332 Diffuse large B-cell lymphoma, intrathoracic lymph nodes: Secondary | ICD-10-CM | POA: Diagnosis not present

## 2022-03-24 LAB — RAD ONC ARIA SESSION SUMMARY
Course Elapsed Days: 5
Plan Fractions Treated to Date: 4
Plan Prescribed Dose Per Fraction: 2.5 Gy
Plan Total Fractions Prescribed: 10
Plan Total Prescribed Dose: 25 Gy
Reference Point Dosage Given to Date: 10 Gy
Reference Point Session Dosage Given: 2.5 Gy
Session Number: 4

## 2022-03-25 ENCOUNTER — Other Ambulatory Visit: Payer: Self-pay

## 2022-03-25 ENCOUNTER — Ambulatory Visit
Admission: RE | Admit: 2022-03-25 | Discharge: 2022-03-25 | Disposition: A | Discharge status: Home / Self Care | Payer: 59 | Source: Ambulatory Visit | Attending: Radiation Oncology | Admitting: Radiation Oncology

## 2022-03-25 DIAGNOSIS — C8332 Diffuse large B-cell lymphoma, intrathoracic lymph nodes: Secondary | ICD-10-CM | POA: Diagnosis not present

## 2022-03-25 LAB — RAD ONC ARIA SESSION SUMMARY
Course Elapsed Days: 6
Plan Fractions Treated to Date: 5
Plan Prescribed Dose Per Fraction: 2.5 Gy
Plan Total Fractions Prescribed: 10
Plan Total Prescribed Dose: 25 Gy
Reference Point Dosage Given to Date: 12.5 Gy
Reference Point Session Dosage Given: 2.5 Gy
Session Number: 5

## 2022-03-26 ENCOUNTER — Other Ambulatory Visit: Payer: Self-pay

## 2022-03-26 ENCOUNTER — Ambulatory Visit
Admission: RE | Admit: 2022-03-26 | Discharge: 2022-03-26 | Disposition: A | Discharge status: Home / Self Care | Payer: 59 | Source: Ambulatory Visit | Attending: Radiation Oncology | Admitting: Radiation Oncology

## 2022-03-26 DIAGNOSIS — C8332 Diffuse large B-cell lymphoma, intrathoracic lymph nodes: Secondary | ICD-10-CM | POA: Diagnosis not present

## 2022-03-26 LAB — RAD ONC ARIA SESSION SUMMARY
Course Elapsed Days: 7
Plan Fractions Treated to Date: 6
Plan Prescribed Dose Per Fraction: 2.5 Gy
Plan Total Fractions Prescribed: 10
Plan Total Prescribed Dose: 25 Gy
Reference Point Dosage Given to Date: 15 Gy
Reference Point Session Dosage Given: 2.5 Gy
Session Number: 6

## 2022-03-27 ENCOUNTER — Other Ambulatory Visit: Payer: Self-pay

## 2022-03-27 ENCOUNTER — Ambulatory Visit
Admission: RE | Admit: 2022-03-27 | Discharge: 2022-03-27 | Disposition: A | Discharge status: Home / Self Care | Payer: 59 | Source: Ambulatory Visit | Attending: Radiation Oncology | Admitting: Radiation Oncology

## 2022-03-27 DIAGNOSIS — C8332 Diffuse large B-cell lymphoma, intrathoracic lymph nodes: Secondary | ICD-10-CM | POA: Diagnosis not present

## 2022-03-27 LAB — RAD ONC ARIA SESSION SUMMARY
Course Elapsed Days: 8
Plan Fractions Treated to Date: 7
Plan Prescribed Dose Per Fraction: 2.5 Gy
Plan Total Fractions Prescribed: 10
Plan Total Prescribed Dose: 25 Gy
Reference Point Dosage Given to Date: 17.5 Gy
Reference Point Session Dosage Given: 2.5 Gy
Session Number: 7

## 2022-03-30 ENCOUNTER — Other Ambulatory Visit: Payer: Self-pay

## 2022-03-30 ENCOUNTER — Ambulatory Visit: Payer: 59

## 2022-03-30 ENCOUNTER — Ambulatory Visit
Admission: RE | Admit: 2022-03-30 | Discharge: 2022-03-30 | Disposition: A | Discharge status: Home / Self Care | Payer: 59 | Source: Ambulatory Visit | Attending: Radiation Oncology | Admitting: Radiation Oncology

## 2022-03-30 DIAGNOSIS — C8332 Diffuse large B-cell lymphoma, intrathoracic lymph nodes: Secondary | ICD-10-CM | POA: Diagnosis not present

## 2022-03-30 LAB — RAD ONC ARIA SESSION SUMMARY
Course Elapsed Days: 11
Plan Fractions Treated to Date: 8
Plan Prescribed Dose Per Fraction: 2.5 Gy
Plan Total Fractions Prescribed: 10
Plan Total Prescribed Dose: 25 Gy
Reference Point Dosage Given to Date: 20 Gy
Reference Point Session Dosage Given: 2.5 Gy
Session Number: 8

## 2022-03-31 ENCOUNTER — Ambulatory Visit
Admission: RE | Admit: 2022-03-31 | Discharge: 2022-03-31 | Disposition: A | Discharge status: Home / Self Care | Payer: 59 | Source: Ambulatory Visit | Attending: Radiation Oncology | Admitting: Radiation Oncology

## 2022-03-31 ENCOUNTER — Ambulatory Visit: Payer: 59

## 2022-03-31 ENCOUNTER — Other Ambulatory Visit: Payer: Self-pay

## 2022-03-31 DIAGNOSIS — C8332 Diffuse large B-cell lymphoma, intrathoracic lymph nodes: Secondary | ICD-10-CM | POA: Diagnosis not present

## 2022-03-31 LAB — RAD ONC ARIA SESSION SUMMARY
Course Elapsed Days: 12
Plan Fractions Treated to Date: 9
Plan Prescribed Dose Per Fraction: 2.5 Gy
Plan Total Fractions Prescribed: 10
Plan Total Prescribed Dose: 25 Gy
Reference Point Dosage Given to Date: 22.5 Gy
Reference Point Session Dosage Given: 2.5 Gy
Session Number: 9

## 2022-04-01 ENCOUNTER — Other Ambulatory Visit: Payer: Self-pay

## 2022-04-01 ENCOUNTER — Ambulatory Visit
Admission: RE | Admit: 2022-04-01 | Discharge: 2022-04-01 | Disposition: A | Discharge status: Home / Self Care | Payer: 59 | Source: Ambulatory Visit | Attending: Radiation Oncology | Admitting: Radiation Oncology

## 2022-04-01 ENCOUNTER — Encounter: Payer: Self-pay | Admitting: Radiation Oncology

## 2022-04-01 DIAGNOSIS — C8332 Diffuse large B-cell lymphoma, intrathoracic lymph nodes: Secondary | ICD-10-CM | POA: Diagnosis not present

## 2022-04-01 LAB — RAD ONC ARIA SESSION SUMMARY
Course Elapsed Days: 13
Plan Fractions Treated to Date: 10
Plan Prescribed Dose Per Fraction: 2.5 Gy
Plan Total Fractions Prescribed: 10
Plan Total Prescribed Dose: 25 Gy
Reference Point Dosage Given to Date: 25 Gy
Reference Point Session Dosage Given: 2.5 Gy
Session Number: 10

## 2022-04-06 ENCOUNTER — Encounter: Payer: Self-pay | Admitting: Hematology

## 2022-04-06 NOTE — Progress Notes (Signed)
                                                                                                                                                             Patient Name: Virginia Oneal MRN: [id number] DOB: [birth year] Referring Physician: Wyvonnia Lora (Profile Not Attached) Date of Service: 04/01/2022 [facility],                                                         End Of Treatment Note  Diagnoses: C83.39-Diffuse large b-cell lymphoma, extranodal and solid organ sites  Cancer Staging: Stage IV B Cell Lymphoma.  Intent: Palliative  Radiation Treatment Dates: 03/19/2022 through 04/01/2022 Site Technique Total Dose (Gy) Dose per Fx (Gy) Completed Fx Beam Energies  Thoracic Spine: Spine_T12 Complex 25/25 2.5 10/10 15X   Narrative: The patient tolerated radiation therapy relatively well. She developed fatigue during therapy.   Plan: The patient will receive a call in about one month from the radiation oncology department. She will continue follow up with Dr. Candise Che as well.   ________________________________________________    Osker Mason, [facility]

## 2022-04-10 ENCOUNTER — Inpatient Hospital Stay: Payer: 59

## 2022-04-10 ENCOUNTER — Inpatient Hospital Stay: Payer: 59 | Attending: Hematology | Admitting: Hematology

## 2022-04-10 ENCOUNTER — Other Ambulatory Visit: Payer: Self-pay

## 2022-04-10 VITALS — BP 160/82 | HR 81 | Temp 98.0°F | Resp 18 | Ht 64.0 in | Wt 252.2 lb

## 2022-04-10 DIAGNOSIS — C8512 Unspecified B-cell lymphoma, intrathoracic lymph nodes: Secondary | ICD-10-CM | POA: Diagnosis present

## 2022-04-10 DIAGNOSIS — Z803 Family history of malignant neoplasm of breast: Secondary | ICD-10-CM | POA: Insufficient documentation

## 2022-04-10 DIAGNOSIS — C8330 Diffuse large B-cell lymphoma, unspecified site: Secondary | ICD-10-CM

## 2022-04-10 DIAGNOSIS — I10 Essential (primary) hypertension: Secondary | ICD-10-CM | POA: Insufficient documentation

## 2022-04-10 DIAGNOSIS — Z87891 Personal history of nicotine dependence: Secondary | ICD-10-CM | POA: Diagnosis not present

## 2022-04-10 DIAGNOSIS — Z90710 Acquired absence of both cervix and uterus: Secondary | ICD-10-CM | POA: Insufficient documentation

## 2022-04-10 DIAGNOSIS — Z800 Family history of malignant neoplasm of digestive organs: Secondary | ICD-10-CM | POA: Insufficient documentation

## 2022-04-10 DIAGNOSIS — Z8051 Family history of malignant neoplasm of kidney: Secondary | ICD-10-CM | POA: Insufficient documentation

## 2022-04-10 DIAGNOSIS — Z95828 Presence of other vascular implants and grafts: Secondary | ICD-10-CM

## 2022-04-10 LAB — CBC WITH DIFFERENTIAL (CANCER CENTER ONLY)
Abs Immature Granulocytes: 0 10*3/uL (ref 0.00–0.07)
Basophils Absolute: 0 10*3/uL (ref 0.0–0.1)
Basophils Relative: 0 %
Eosinophils Absolute: 0.1 10*3/uL (ref 0.0–0.5)
Eosinophils Relative: 3 %
HCT: 37.1 % (ref 36.0–46.0)
Hemoglobin: 12.5 g/dL (ref 12.0–15.0)
Immature Granulocytes: 0 %
Lymphocytes Relative: 16 %
Lymphs Abs: 0.6 10*3/uL — ABNORMAL LOW (ref 0.7–4.0)
MCH: 29.5 pg (ref 26.0–34.0)
MCHC: 33.7 g/dL (ref 30.0–36.0)
MCV: 87.5 fL (ref 80.0–100.0)
Monocytes Absolute: 0.4 10*3/uL (ref 0.1–1.0)
Monocytes Relative: 10 %
Neutro Abs: 2.4 10*3/uL (ref 1.7–7.7)
Neutrophils Relative %: 71 %
Platelet Count: 131 10*3/uL — ABNORMAL LOW (ref 150–400)
RBC: 4.24 MIL/uL (ref 3.87–5.11)
RDW: 12.8 % (ref 11.5–15.5)
WBC Count: 3.5 10*3/uL — ABNORMAL LOW (ref 4.0–10.5)
nRBC: 0 % (ref 0.0–0.2)

## 2022-04-10 LAB — CMP (CANCER CENTER ONLY)
ALT: 30 U/L (ref 0–44)
AST: 22 U/L (ref 15–41)
Albumin: 4 g/dL (ref 3.5–5.0)
Alkaline Phosphatase: 98 U/L (ref 38–126)
Anion gap: 6 (ref 5–15)
BUN: 19 mg/dL (ref 6–20)
CO2: 29 mmol/L (ref 22–32)
Calcium: 9.5 mg/dL (ref 8.9–10.3)
Chloride: 105 mmol/L (ref 98–111)
Creatinine: 0.81 mg/dL (ref 0.44–1.00)
GFR, Estimated: >60 mL/min (ref >60)
Glucose, Bld: 146 mg/dL — ABNORMAL HIGH (ref 70–99)
Potassium: 3.4 mmol/L — ABNORMAL LOW (ref 3.5–5.1)
Sodium: 140 mmol/L (ref 135–145)
Total Bilirubin: 0.3 mg/dL (ref 0.3–1.2)
Total Protein: 6.6 g/dL (ref 6.5–8.1)

## 2022-04-10 LAB — LACTATE DEHYDROGENASE: LDH: 169 U/L (ref 98–192)

## 2022-04-10 MED ORDER — SODIUM CHLORIDE 0.9% FLUSH
10.0000 mL | Freq: Once | INTRAVENOUS | Status: AC
  Administered 2022-04-10: 10 mL

## 2022-04-10 MED ORDER — HEPARIN SOD (PORK) LOCK FLUSH 100 UNIT/ML IV SOLN
500.0000 [IU] | Freq: Once | INTRAVENOUS | Status: AC
  Administered 2022-04-10: 500 [IU]

## 2022-04-10 NOTE — Progress Notes (Signed)
[name]   HEMATOLOGY/ONCOLOGY CLINIC NOTE  Date of Service: 04/10/2022   Patient Care Team: Pleas Koch, NP as PCP - General (Internal Medicine) Kristeen Miss, MD as Consulting Physician (Neurosurgery) Rolm Bookbinder, MD as Consulting Physician (General Surgery)  CHIEF COMPLAINTS/PURPOSE OF CONSULTATION:  Follow-up for continued evaluation and management of large B-cell lymphoma and for PET CT review after 6 cycles of R-CHOP chemotherapy  HISTORY OF PRESENTING ILLNESS:  Please see previous note for details of initial presentation  INTERVAL HISTORY Virginia Oneal is a 56 y.o. female here for follow-up regarding her large B-cell lymphoma after having completed 6 cycles of planned chemoimmunotherapy. She reports She is doing well with no new symptoms or concerns.  She reports pain in lower back area.She notes that this is not new. She further reports that some times the pain travels down her leg. She has a referral to orthopedics but she has decided to wait until she sees her PCP.  We discussed that due to her slightly reduced potassium levels today that she may need to speak with her PCP regarding restarting potassium. She was advised to try eating bananas and drink orange juice to try to increase potassium levels.  We discussed port removal which she has decided to hold off on until after scans are done.  She was advised to update and pursue age-appropriate vaccinations with PCP.  No fevers no chills no night sweats  No new lumps or bumps. Good p.o. intake. No abnormal bleeding or bruising. No other new or acute focal symptoms.  Labs done today were reviewed in detail.  MEDICAL HISTORY:  Past Medical History:  Diagnosis Date   Abnormal uterine bleeding    Anemia    as a teenager   Anxiety    postpartum and hx panic attacks   Arrhythmia    Arthritis    back and hands   Chest mass    by CT scanning   Chest pain 39/76/7341   Complication of anesthesia    slow to  wake up   COVID-19 10/2020   Depression    DOE (dyspnea on exertion) 07/26/2014   Family history of adverse reaction to anesthesia    mom slow to wake up   Gestational diabetes    resolved after pregnancy   History of kidney stones    Hypertension    Personal history of colonic polyps    PONV (postoperative nausea and vomiting)    PREMATURE ATRIAL CONTRACTIONS 08/15/2008   Qualifier: Diagnosis of  By: Council Mechanic MD, Hilaria Ota    RENAL CALCULUS, HX OF 08/15/2008   Qualifier: Diagnosis of  By: Zara Council LPN, Wanda     Rotator cuff tendinitis 06/29/2011   Seasonal allergies    Urinary incontinence 05/2009   hx bladder sling--Dr. Quincy Simmonds    SURGICAL HISTORY: Past Surgical History:  Procedure Laterality Date   ABDOMINAL HYSTERECTOMY  05/2009   TAH/LSO   BLADDER SUSPENSION  2010   Dr. Quincy Simmonds   BREAST BIOPSY     left stereo   CESAREAN SECTION  03/2003   CESAREAN SECTION  2004   CHOLECYSTECTOMY N/A 01/02/2021   Procedure: LAPAROSCOPIC CHOLECYSTECTOMY;  Surgeon: Rolm Bookbinder, MD;  Location: [facility];  Service: General;  Laterality: N/A;  58  - GEN AND TAP BLOCK LDOW   COLONOSCOPY  09/08   COLPORRHAPHY  05/2009   Dr. Quincy Simmonds   ENDOMETRIAL ABLATION  2006   Dr. Quincy Simmonds   INCONTINENCE SURGERY     sling for urinary  incontinence   IR IMAGING GUIDED PORT INSERTION  09/01/2021   KYPHOPLASTY N/A 04/07/2021   Procedure: Thoracic 12 Vertebral body biopsy;  Surgeon: Kristeen Miss, MD;  Location: [place];  Service: Neurosurgery;  Laterality: N/A;   LAPAROSCOPIC ENDOMETRIOSIS FULGURATION     ablation   LUMBAR LAMINECTOMY/DECOMPRESSION MICRODISCECTOMY N/A 07/31/2021   Procedure: Open Transpedicular biopsy of Thoracic twelve;  Surgeon: Kristeen Miss, MD;  Location: [place];  Service: Neurosurgery;  Laterality: N/A;   OOPHORECTOMY  2010   w/hyst--Dr. Quincy Simmonds   TUBAL LIGATION  2004   w/ C-section Dr. Quincy Simmonds   TYMPANOPLASTY     right ear   VAGINAL HYSTERECTOMY  05/21/2009   lap assisted /partial    SOCIAL  HISTORY: Social History   Socioeconomic History   Marital status: Married    Spouse name: Not on file   Number of children: 2   Years of education: Not on file   Highest education level: Not on file  Occupational History   Occupation: [employer]  Tobacco Use   Smoking status: Former    Years: 10.00    Types: Cigarettes    Quit date: 01/06/1999    Years since quitting: 23.2   Smokeless tobacco: Never  Vaping Use   Vaping Use: Never used  Substance and Sexual Activity   Alcohol use: No    Alcohol/week: 0.0 standard drinks of alcohol   Drug use: No   Sexual activity: Yes    Partners: Male    Birth control/protection: Other-see comments, Surgical    Comment: TAH/LSO  Other Topics Concern   Not on file  Social History Narrative   ** Merged History Encounter **       Social Determinants of Health   Financial Resource Strain: Not on file  Food Insecurity: Not on file  Transportation Needs: Not on file  Physical Activity: Not on file  Stress: Not on file  Social Connections: Not on file  Intimate Partner Violence: Not on file    FAMILY HISTORY: Family History  Problem Relation Age of Onset   Kidney disease Mother        renal failure   Stroke Mother    Diabetes Mother    Hypertension Mother    Heart failure Mother    Colon cancer Mother    Stroke Father    Cancer - Colon Maternal Aunt    Hypertension Sister    Rheum arthritis Sister    Hypertension Brother    Hypertension Sister    Hypertension Sister    Hypertension Sister    Breast cancer Sister    Kidney cancer Sister    Esophageal cancer Neg Hx    Rectal cancer Neg Hx    Stomach cancer Neg Hx     ALLERGIES:  is allergic to buspar [buspirone hcl].  MEDICATIONS:  Current Outpatient Medications  Medication Sig Dispense Refill   acetaminophen (TYLENOL) 500 MG tablet Take 1,000 mg by mouth every 6 (six) hours as needed for moderate pain or headache.     amoxicillin (AMOXIL) 500 MG tablet Take 4  tablets (2,000 mg total) by mouth once as needed for up to 1 dose (1 to 2 hours prior to dental procedure). (Patient not taking: Reported on 03/03/2022) 4 tablet 0   b complex vitamins capsule Take 1 capsule by mouth daily.     cetirizine (ZYRTEC) 10 MG tablet Take 10 mg by mouth daily.      Cholecalciferol (VITAMIN D3) 125 MCG (5000 UT) CAPS  Take 5,000 Units by mouth daily.     COVID-19 mRNA bivalent vaccine, Moderna, (MODERNA COVID-19 BIVAL BOOSTER) 50 MCG/0.5ML injection Inject into the muscle. 0.5 mL 0   hydrochlorothiazide (HYDRODIURIL) 25 MG tablet Take 1 tablet (25 mg total) by mouth daily. For blood pressure. 90 tablet 3   hydrOXYzine (ATARAX/VISTARIL) 10 MG tablet Take 1 or 2 tablets by mouth as needed for anxiety attack. 30 tablet 0   KLOR-CON M20 20 MEQ tablet TAKE 1 TABLET BY MOUTH TWICE A DAY 180 tablet 1   lidocaine-prilocaine (EMLA) cream Apply to affected area once 30 g 3   Methylcobalamin (B-12) 5000 MCG TBDP Take 5,000 mcg by mouth daily.     omeprazole (PRILOSEC) 40 MG capsule TAKE 1 CAPSULE (40 MG TOTAL) BY MOUTH DAILY. 90 capsule 1   ondansetron (ZOFRAN) 8 MG tablet Take 1 tablet (8 mg total) by mouth 2 (two) times daily as needed for refractory nausea / vomiting. Start on day 3 after cyclophosphamide chemotherapy. 30 tablet 1   prochlorperazine (COMPAZINE) 10 MG tablet Take 1 tablet (10 mg total) by mouth every 6 (six) hours as needed (Nausea or vomiting). 30 tablet 6   venlafaxine XR (EFFEXOR-XR) 37.5 MG 24 hr capsule Take 1 capsule (37.5 mg total) by mouth daily with breakfast. For hot flashes and anxiety. Due in July for office visit. 90 capsule 0   No current facility-administered medications for this visit.    REVIEW OF SYSTEMS:   10 Point review of Systems was done is negative except as noted above.  PHYSICAL EXAMINATION: .There were no vitals taken for this visit. NAD GENERAL:alert, in no acute distress and comfortable SKIN: no acute rashes, no significant  lesions EYES: conjunctiva are pink and non-injected, sclera anicteric NECK: supple, no JVD LYMPH:  no palpable lymphadenopathy in the cervical, axillary or inguinal regions LUNGS: clear to auscultation b/l with normal respiratory effort HEART: regular rate & rhythm ABDOMEN:  normoactive bowel sounds , non tender, not distended. Extremity: no pedal edema PSYCH: alert & oriented x 3 with fluent speech NEURO: no focal motor/sensory deficits  LABORATORY DATA:  I have reviewed the data as listed  .    Latest Ref Rng & Units 04/10/2022    8:20 AM 02/11/2022   10:12 AM 12/31/2021   10:01 AM  CBC  WBC 4.0 - 10.5 K/uL 3.5  4.6  5.1   Hemoglobin 12.0 - 15.0 g/dL 12.5  11.9  10.3   Hematocrit 36.0 - 46.0 % 37.1  34.7  30.6   Platelets 150 - 400 K/uL 131  163  226    .    Latest Ref Rng & Units 02/11/2022   10:12 AM 12/31/2021   10:01 AM 12/10/2021   10:15 AM  CMP  Glucose 70 - 99 mg/dL 160  174  170   BUN 6 - 20 mg/dL 16  15  18    Creatinine 0.44 - 1.00 mg/dL 0.64  0.65  0.71   Sodium 135 - 145 mmol/L 137  139  139   Potassium 3.5 - 5.1 mmol/L 3.5  3.7  3.5   Chloride 98 - 111 mmol/L 103  103  103   CO2 22 - 32 mmol/L 27  30  29    Calcium 8.9 - 10.3 mg/dL 9.2  9.7  9.5   Total Protein 6.5 - 8.1 g/dL 6.4  6.2  6.7   Total Bilirubin 0.3 - 1.2 mg/dL 0.5  0.4  0.4   Alkaline Phos 38 -  126 U/L 85  76  84   AST 15 - 41 U/L 32  25  24   ALT 0 - 44 U/L 39  31  31    Lab Results  Component Value Date   LDH 392 (H) 02/11/2022    HIV Screen 4th Generation wRfx     Non Reactive Non Reactive  Hepatitis B Surface Ag     NON REACTIVE NON REACTIVE  Hep B Core Total Ab     NON REACTIVE NON REACTIVE  HCV Ab     NON REACTIVE NON REACTIVE  LDH     98 - 192 U/L 228 (H)    SURGICAL PATHOLOGY  CASE: [id number]  PATIENT: Pa Bekele  Surgical Pathology Report   Clinical History: lesion of thoracic vertebra (cm)   FINAL MICROSCOPIC DIAGNOSIS:   A-C. BONE, T12 VERTEBRAL BODY AND MARROW,  BIOPSY:  -  Non-Hodgkin B-cell lymphoma, high grade  -  See comment   COMMENT:   A-C.  The biopsies show normal hematopoeisis admixed with crush artifact  with associated marrow fibrosis and an atypical cellular infiltrate  composed of medium to large cells with irregular nuclear contours, ample  cytoplasm, variably prominent nucleoli, increased mitoses and apoptosis.  An initial panel of immunohistochemistry (cytokeratin AE1/3, cytokeratin  7, cytokeratin 20, CD20 and CD3) confirmed the morphologic impression  that the atypical cells were lymphoid in nature.  The atypical  lymphocytes are positive for Pax-5, CD20, CD10, bcl-6 but negative for  CD30, bcl-2, mum-1 and EBV by in-situ hybridization. CD3 and CD5  highlight background T-cells.  The proliferative rate by ki-67 is  increased (50-60%).  Overall, the findings are consistent with a  non-Hodgkin B-cell lymphoma, high grade with a germinal center  phenotype.   FISH studies are pending and will be reported in an  addendum.  These findings were discussed with Dr. Ellene Route on August 06, 2021.    RADIOGRAPHIC STUDIES: I have personally reviewed the radiological images as listed and agreed with the findings in the report.  ECHO 09/08/2021 1.Left ventricular ejection fraction, by estimation, is 55 to 60%. The left ventricle has normal function. The left ventricle has no regional wall motion abnormalities. There ismild left ventricular hypertrophy. Left ventricular diastolic parameters were normal.The average left ventricular global longitudinal strain is -20.4 %. The global longitudinal strain is normal. 2. Right ventricular systolic function is normal. The right ventricular size is normal. 3. Left atrial size was mildly dilated. The mitral valve is normal in structure. Trivial mitral valve regurgitation. No evidence of mitral stenosis. Moderate mitral annular calcification. 4.The aortic valve is normal in structure. Aortic valve  regurgitation is not visualized. No aortic stenosis is present. 5.The inferior vena cava is normal in size with greater than 50% respiratory variability, suggesting right atrial pressure of 3 mmHg.  No results found.  ASSESSMENT & PLAN:   56 year old female with  1) Stage IVA high-grade large B cell non-Hodgkin's lymphoma.  Germinal center phenotype.  C-Myc not rearranged.  Ki-67 50 to 60% No overt constitutional symptoms PET/CT with involvement of thoracic spine and acromion process on the right.  Abdominal lymphadenopathy.  Hepatitis testing unremarkable HIV nonreactive Myeloma panel shows no M spike protein spike.  Echo cardiogram shows normal ejection fraction  PET CT scan done on 09/02/2021 shows Scattered mild hypermetabolism within subcentimeter periportal, retroperitoneal and mesenteric lymph nodes, compatible with the given history of large B-cell lymphoma. 2. Hypermetabolism within known T7 and T12 lesions, better seen  on MR thoracic spine 07/04/2021. 3. Focal hypermetabolism in the acromion of the right scapula, without a definite CT correlate. Recommend attention on follow-up.  2) Chemotherapy related neutropenia -now resolved.  No issues with infections.    3) G-CSF related significant bone pain.  PLAN -Labs done today were discussed in detail with the patient and husband. CBC shows recovering hemoglobin levels and WBC count is 3.5k and platelets are a little low at 131 likely from recent radiation. CMP stable. Potassium is slightly reduced at 3.4. LDH normalized at 169. No other toxicities from her last cycle of chemoimmunotherapy -She will return to the clinic in 3 months with labs and port flush.  Follow-up RTC with Dr. Irene Limbo with port flush and labs in 3 months  The total time spent in the appointment was *** minutes*.  All of the patient's questions were answered with apparent satisfaction. The patient knows to call the clinic with any problems, questions or  concerns.  Sullivan Lone MD MS AAHIVMS [facility] Hematology/Oncology Physician [facility]  .*Total Encounter Time as defined by the Centers for Medicare and Medicaid Services includes, in addition to the face-to-face time of a patient visit (documented in the note above) non-face-to-face time: obtaining and reviewing outside history, ordering and reviewing medications, tests or procedures, care coordination (communications with other health care professionals or caregivers) and documentation in the medical record.  I, Melene Muller, am acting as scribe for Dr. Sullivan Lone, MD.

## 2022-04-16 ENCOUNTER — Encounter: Payer: Self-pay | Admitting: Hematology

## 2022-04-28 ENCOUNTER — Other Ambulatory Visit: Payer: Self-pay | Admitting: Primary Care

## 2022-04-28 DIAGNOSIS — R232 Flushing: Secondary | ICD-10-CM

## 2022-04-28 DIAGNOSIS — F419 Anxiety disorder, unspecified: Secondary | ICD-10-CM

## 2022-04-28 NOTE — Telephone Encounter (Signed)
Patient is due for CPE/follow up, this will be required prior to any further refills.   Please schedule. Let me know once scheduled.

## 2022-04-29 ENCOUNTER — Other Ambulatory Visit: Payer: Self-pay | Admitting: Primary Care

## 2022-04-29 DIAGNOSIS — Z1231 Encounter for screening mammogram for malignant neoplasm of breast: Secondary | ICD-10-CM

## 2022-05-06 ENCOUNTER — Ambulatory Visit
Admission: RE | Admit: 2022-05-06 | Discharge: 2022-05-06 | Disposition: A | Discharge status: Home / Self Care | Payer: 59 | Source: Ambulatory Visit | Attending: Primary Care | Admitting: Primary Care

## 2022-05-06 DIAGNOSIS — Z1231 Encounter for screening mammogram for malignant neoplasm of breast: Secondary | ICD-10-CM

## 2022-05-11 ENCOUNTER — Ambulatory Visit
Admission: RE | Admit: 2022-05-11 | Discharge: 2022-05-11 | Disposition: A | Discharge status: Home / Self Care | Payer: 59 | Source: Ambulatory Visit | Attending: Radiation Oncology | Admitting: Radiation Oncology

## 2022-05-11 DIAGNOSIS — C8330 Diffuse large B-cell lymphoma, unspecified site: Secondary | ICD-10-CM

## 2022-05-20 ENCOUNTER — Other Ambulatory Visit: Payer: Self-pay | Admitting: Primary Care

## 2022-05-20 DIAGNOSIS — I10 Essential (primary) hypertension: Secondary | ICD-10-CM

## 2022-05-21 ENCOUNTER — Encounter: Payer: Self-pay | Admitting: Primary Care

## 2022-05-21 ENCOUNTER — Ambulatory Visit (INDEPENDENT_AMBULATORY_CARE_PROVIDER_SITE_OTHER): Payer: 59 | Admitting: Primary Care

## 2022-05-21 VITALS — BP 146/74 | HR 96 | Temp 98.7°F | Ht 64.0 in | Wt 253.0 lb

## 2022-05-21 DIAGNOSIS — C8330 Diffuse large B-cell lymphoma, unspecified site: Secondary | ICD-10-CM

## 2022-05-21 DIAGNOSIS — I10 Essential (primary) hypertension: Secondary | ICD-10-CM | POA: Diagnosis not present

## 2022-05-21 DIAGNOSIS — R232 Flushing: Secondary | ICD-10-CM | POA: Diagnosis not present

## 2022-05-21 DIAGNOSIS — R7303 Prediabetes: Secondary | ICD-10-CM | POA: Diagnosis not present

## 2022-05-21 DIAGNOSIS — F419 Anxiety disorder, unspecified: Secondary | ICD-10-CM

## 2022-05-21 DIAGNOSIS — Z0001 Encounter for general adult medical examination with abnormal findings: Secondary | ICD-10-CM

## 2022-05-21 DIAGNOSIS — Z800 Family history of malignant neoplasm of digestive organs: Secondary | ICD-10-CM

## 2022-05-21 LAB — LIPID PANEL
Cholesterol: 206 mg/dL — ABNORMAL HIGH (ref 0–200)
HDL: 62.3 mg/dL (ref >39.00)
LDL Cholesterol: 112 mg/dL — ABNORMAL HIGH (ref 0–99)
NonHDL: 143.45
Total CHOL/HDL Ratio: 3
Triglycerides: 155 mg/dL — ABNORMAL HIGH (ref 0.0–149.0)
VLDL: 31 mg/dL (ref 0.0–40.0)

## 2022-05-21 LAB — BASIC METABOLIC PANEL
BUN: 15 mg/dL (ref 6–23)
CO2: 33 mEq/L — ABNORMAL HIGH (ref 19–32)
Calcium: 9.6 mg/dL (ref 8.4–10.5)
Chloride: 101 mEq/L (ref 96–112)
Creatinine, Ser: 0.79 mg/dL (ref 0.40–1.20)
GFR: 82.68 mL/min (ref >60.00)
Glucose, Bld: 150 mg/dL — ABNORMAL HIGH (ref 70–99)
Potassium: 4 mEq/L (ref 3.5–5.1)
Sodium: 140 mEq/L (ref 135–145)

## 2022-05-21 LAB — HEMOGLOBIN A1C: Hgb A1c MFr Bld: 6.5 % (ref 4.6–6.5)

## 2022-05-21 MED ORDER — LOSARTAN POTASSIUM-HCTZ 50-12.5 MG PO TABS: 1.0000 | ORAL_TABLET | Freq: Every day | ORAL | 0 refills | Status: DC

## 2022-05-21 NOTE — Progress Notes (Signed)
  Radiation Oncology         [phone] ________________________________  Name: Virginia Oneal MRN: [id number]  Date of Service: 05/11/2022  DOB: 11/11/1965  Post Treatment Telephone Note  Diagnosis:   Stage IV B Cell Lymphoma.  Intent: Palliative  Radiation Treatment Dates: 03/19/2022 through 04/01/2022 Site Technique Total Dose (Gy) Dose per Fx (Gy) Completed Fx Beam Energies  Thoracic Spine: Spine_T12 Complex 25/25 2.5 10/10 15X   Narrative: The patient tolerated radiation therapy relatively well. She developed fatigue during therapy.    Impression/Plan: 1. Stage IV B Cell Lymphoma.. The patient has been doing well since completion of radiotherapy. We discussed that we would be happy to continue to follow him as needed, but he will also continue to follow up with Dr. Irene Limbo in medical oncology.      Carola Rhine, PAC

## 2022-05-21 NOTE — Assessment & Plan Note (Signed)
Following with oncology and radiation oncology, office notes and labs reviewed from June 2023.

## 2022-05-21 NOTE — Assessment & Plan Note (Signed)
Colonoscopy UTD, due 2024. Patient aware

## 2022-05-21 NOTE — Assessment & Plan Note (Addendum)
Above goal today, also with multiple documented readings above goal.  Reduce HCTZ to 12.5 mg daily. History of hypokalemia. Add losartan 50 mg daily.   We will plan to see her back in 2-3 weeks for BP check and BMP.

## 2022-05-21 NOTE — Progress Notes (Signed)
  Radiation Oncology         [phone] ________________________________  Name: Virginia Oneal MRN: [id number]  Date of Service: 05/11/2022  DOB: April 14, 1966  Post Treatment Telephone Note  Diagnosis:   Stage IV B Cell Lymphoma.  Intent: Palliative  Radiation Treatment Dates: 03/19/2022 through 04/01/2022 Site Technique Total Dose (Gy) Dose per Fx (Gy) Completed Fx Beam Energies  Thoracic Spine: Spine_T12 Complex 25/25 2.5 10/10 15X    Narrative: The patient tolerated radiation therapy relatively well. She developed fatigue during therapy.     Impression/Plan: 1. Stage IV B Cell Lymphoma.. The patient has been doing well since completion of radiotherapy, she has had some bony aches that she things are related to her systemic therapy. She will follow up with Dr. Irene Limbo in September and we will see her as needed moving forward.      Carola Rhine, PAC

## 2022-05-21 NOTE — Assessment & Plan Note (Signed)
Immunizations UTD. Mammogram UTD. Colonoscopy due 2024  Discussed the importance of a healthy diet and regular exercise in order for weight loss, and to reduce the risk of further co-morbidity.  Exam stable. Labs pending.  Follow up in 1 year for repeat physical.

## 2022-05-21 NOTE — Progress Notes (Signed)
Subjective:    Patient ID: Virginia Oneal, female    DOB: 24-Dec-1965, 56 y.o.   MRN: [id number]  HPI  Virginia Oneal is a very pleasant 56 y.o. female who presents today for complete physical and follow up of chronic conditions.  Immunizations: -Tetanus: 2020 -Influenza: Due this season  -Covid-19: 4 vaccines -Shingles: Completed Shingrix series  Diet: Fair diet.  Exercise: Recently began walking.   Eye exam: Completed 2 years ago  Dental exam: Completes semi-annually   Pap Smear: Hysterectomy  Mammogram: Completed in July 2023  Colonoscopy: Completed in 2019, due 2024  She does not check BP at home. She denies headaches, dizziness, chest pain. She's been told by numerous health care providers that her BP is too high. She was told to contact her PCP.   BP Readings from Last 3 Encounters:  05/21/22 (!) 146/74  04/10/22 (!) 160/82  03/03/22 (!) 160/100      Review of Systems  Constitutional:  Negative for unexpected weight change.  HENT:  Negative for rhinorrhea.   Eyes:  Negative for visual disturbance.  Respiratory:  Negative for cough and shortness of breath.   Cardiovascular:  Negative for chest pain.  Gastrointestinal:  Negative for constipation and diarrhea.  Genitourinary:  Negative for difficulty urinating.  Musculoskeletal:  Positive for arthralgias.  Skin:  Negative for rash.  Allergic/Immunologic: Negative for environmental allergies.  Neurological:  Negative for dizziness and headaches.  Psychiatric/Behavioral:  The patient is not nervous/anxious.          Past Medical History:  Diagnosis Date   Abnormal uterine bleeding    Anemia    as a teenager   Anxiety    postpartum and hx panic attacks   Arrhythmia    Arthritis    back and hands   Chest mass    by CT scanning   Chest pain 32/35/5732   Complication of anesthesia    slow to wake up   COVID-19 10/2020   Depression    DOE (dyspnea on exertion) 07/26/2014   Family history of  adverse reaction to anesthesia    mom slow to wake up   Gestational diabetes    resolved after pregnancy   History of kidney stones    Hypertension    Personal history of colonic polyps    PONV (postoperative nausea and vomiting)    PREMATURE ATRIAL CONTRACTIONS 08/15/2008   Qualifier: Diagnosis of  By: Council Mechanic MD, Hilaria Ota    RENAL CALCULUS, HX OF 08/15/2008   Qualifier: Diagnosis of  By: Zara Council LPN, Wanda     Rotator cuff tendinitis 06/29/2011   Seasonal allergies    Urinary incontinence 05/2009   hx bladder sling--Dr. Quincy Simmonds    Social History   Socioeconomic History   Marital status: Married    Spouse name: Not on file   Number of children: 2   Years of education: Not on file   Highest education level: Not on file  Occupational History   Occupation: [employer]  Tobacco Use   Smoking status: Former    Years: 10.00    Types: Cigarettes    Quit date: 01/06/1999    Years since quitting: 23.3   Smokeless tobacco: Never  Vaping Use   Vaping Use: Never used  Substance and Sexual Activity   Alcohol use: No    Alcohol/week: 0.0 standard drinks of alcohol   Drug use: No   Sexual activity: Yes    Partners: Male    Birth  control/protection: Other-see comments, Surgical    Comment: TAH/LSO  Other Topics Concern   Not on file  Social History Narrative   ** Merged History Encounter **       Social Determinants of Health   Financial Resource Strain: Not on file  Food Insecurity: Not on file  Transportation Needs: Not on file  Physical Activity: Not on file  Stress: Not on file  Social Connections: Not on file  Intimate Partner Violence: Not on file    Past Surgical History:  Procedure Laterality Date   ABDOMINAL HYSTERECTOMY  05/2009   TAH/LSO   BLADDER SUSPENSION  2010   Dr. Quincy Simmonds   BREAST BIOPSY     left stereo   CESAREAN SECTION  03/2003   CESAREAN SECTION  2004   CHOLECYSTECTOMY N/A 01/02/2021   Procedure: LAPAROSCOPIC CHOLECYSTECTOMY;  Surgeon:  Rolm Bookbinder, MD;  Location: [facility];  Service: General;  Laterality: N/A;  58  - GEN AND TAP BLOCK LDOW   COLONOSCOPY  09/08   COLPORRHAPHY  05/2009   Dr. Quincy Simmonds   ENDOMETRIAL ABLATION  2006   Dr. Quincy Simmonds   INCONTINENCE SURGERY     sling for urinary incontinence   IR IMAGING GUIDED PORT INSERTION  09/01/2021   KYPHOPLASTY N/A 04/07/2021   Procedure: Thoracic 12 Vertebral body biopsy;  Surgeon: Kristeen Miss, MD;  Location: [facility];  Service: Neurosurgery;  Laterality: N/A;   LAPAROSCOPIC ENDOMETRIOSIS FULGURATION     ablation   LUMBAR LAMINECTOMY/DECOMPRESSION MICRODISCECTOMY N/A 07/31/2021   Procedure: Open Transpedicular biopsy of Thoracic twelve;  Surgeon: Kristeen Miss, MD;  Location: [facility];  Service: Neurosurgery;  Laterality: N/A;   OOPHORECTOMY  2010   w/hyst--Dr. Quincy Simmonds   TUBAL LIGATION  2004   w/ C-section Dr. Quincy Simmonds   TYMPANOPLASTY     right ear   VAGINAL HYSTERECTOMY  05/21/2009   lap assisted /partial    Family History  Problem Relation Age of Onset   Kidney disease Mother        renal failure   Stroke Mother    Diabetes Mother    Hypertension Mother    Heart failure Mother    Colon cancer Mother    Stroke Father    Cancer - Colon Maternal Aunt    Hypertension Sister    Rheum arthritis Sister    Hypertension Brother    Hypertension Sister    Hypertension Sister    Hypertension Sister    Breast cancer Sister    Kidney cancer Sister    Esophageal cancer Neg Hx    Rectal cancer Neg Hx    Stomach cancer Neg Hx     Allergies  Allergen Reactions   Buspar [Buspirone Hcl]     Worsened her panic attacks    Current Outpatient Medications on File Prior to Visit  Medication Sig Dispense Refill   acetaminophen (TYLENOL) 500 MG tablet Take 1,000 mg by mouth every 6 (six) hours as needed for moderate pain or headache.     b complex vitamins capsule Take 1 capsule by mouth daily.     cetirizine (ZYRTEC) 10 MG tablet Take 10 mg by mouth daily.      Cholecalciferol  (VITAMIN D3) 125 MCG (5000 UT) CAPS Take 5,000 Units by mouth daily.     hydrOXYzine (ATARAX/VISTARIL) 10 MG tablet Take 1 or 2 tablets by mouth as needed for anxiety attack. 30 tablet 0   lidocaine-prilocaine (EMLA) cream Apply to affected area once 30 g 3  Methylcobalamin (B-12) 5000 MCG TBDP Take 5,000 mcg by mouth daily.     ondansetron (ZOFRAN) 8 MG tablet Take 1 tablet (8 mg total) by mouth 2 (two) times daily as needed for refractory nausea / vomiting. Start on day 3 after cyclophosphamide chemotherapy. 30 tablet 1   prochlorperazine (COMPAZINE) 10 MG tablet Take 1 tablet (10 mg total) by mouth every 6 (six) hours as needed (Nausea or vomiting). 30 tablet 6   venlafaxine XR (EFFEXOR-XR) 37.5 MG 24 hr capsule Take 1 capsule (37.5 mg total) by mouth daily with breakfast. For hot flashes and anxiety. Office visit required for further refills. 90 capsule 0   No current facility-administered medications on file prior to visit.    BP (!) 146/74   Pulse 96   Temp 98.7 F (37.1 C) (Oral)   Ht '5\' 4"'$  (1.626 m)   Wt 253 lb (114.8 kg)   SpO2 99%   BMI 43.43 kg/m  Objective:   Physical Exam HENT:     Right Ear: Tympanic membrane and ear canal normal.     Left Ear: Tympanic membrane and ear canal normal.     Nose: Nose normal.  Eyes:     Conjunctiva/sclera: Conjunctivae normal.     Pupils: Pupils are equal, round, and reactive to light.  Neck:     Thyroid: No thyromegaly.  Cardiovascular:     Rate and Rhythm: Normal rate and regular rhythm.     Heart sounds: No murmur heard. Pulmonary:     Effort: Pulmonary effort is normal.     Breath sounds: Normal breath sounds. No rales.  Abdominal:     General: Bowel sounds are normal.     Palpations: Abdomen is soft.     Tenderness: There is no abdominal tenderness.  Musculoskeletal:        General: Normal range of motion.     Cervical back: Neck supple.  Lymphadenopathy:     Cervical: No cervical adenopathy.  Skin:    General: Skin is  warm and dry.     Findings: No rash.  Neurological:     Mental Status: She is alert and oriented to person, place, and time.     Cranial Nerves: No cranial nerve deficit.     Deep Tendon Reflexes: Reflexes are normal and symmetric.  Psychiatric:        Mood and Affect: Mood normal.           Assessment & Plan:   Problem List Items Addressed This Visit       Cardiovascular and Mediastinum   Essential hypertension    Above goal today, also with multiple documented readings above goal.  Reduce HCTZ to 12.5 mg daily. History of hypokalemia. Add losartan 50 mg daily.   We will plan to see her back in 2-3 weeks for BP check and BMP.      Relevant Medications   losartan-hydrochlorothiazide (HYZAAR) 50-12.5 MG tablet   Other Relevant Orders   Lipid panel   Basic metabolic panel   Hot flashes    Controlled.  Continue venlafaxine ER 37.5 mg daily.      Relevant Medications   losartan-hydrochlorothiazide (HYZAAR) 50-12.5 MG tablet     Other   Prediabetes    Discussed the importance of a healthy diet and regular exercise in order for weight loss, and to reduce the risk of further co-morbidity.  Repeat A1C pending.      Relevant Orders   Hemoglobin A1c   Anxiety  Controlled.  Continue venlafaxine ER 37.5 mg daily. Continue hydroxyzine 10 mg PRN.      Family history of colon cancer in mother and 2 aunts    Colonoscopy UTD, due 2024. Patient aware      Encounter for annual general medical examination with abnormal findings in adult - Primary    Immunizations UTD. Mammogram UTD. Colonoscopy due 2024  Discussed the importance of a healthy diet and regular exercise in order for weight loss, and to reduce the risk of further co-morbidity.  Exam stable. Labs pending.  Follow up in 1 year for repeat physical.       Diffuse large B cell lymphoma ([place])    Following with oncology and radiation oncology, office notes and labs reviewed from June 2023.             Pleas Koch, NP

## 2022-05-21 NOTE — Assessment & Plan Note (Signed)
Controlled.  Continue venlafaxine ER 37.5 mg daily. Continue hydroxyzine 10 mg PRN.

## 2022-05-21 NOTE — Patient Instructions (Addendum)
We changed your blood pressure regimen to losartan-hydrochlorothiazide 50-12.5 mg. Take 1 tablet once daily.  Stop by the lab prior to leaving today. I will notify you of your results once received.   Please schedule a follow up visit to meet back with me in 2-3 weeks for blood pressure check.   It was a pleasure to see you today!  Preventive Care 62-58 Years Old, Female Preventive care refers to lifestyle choices and visits with your health care provider that can promote health and wellness. Preventive care visits are also called wellness exams. What can I expect for my preventive care visit? Counseling Your health care provider may ask you questions about your: Medical history, including: Past medical problems. Family medical history. Pregnancy history. Current health, including: Menstrual cycle. Method of birth control. Emotional well-being. Home life and relationship well-being. Sexual activity and sexual health. Lifestyle, including: Alcohol, nicotine or tobacco, and drug use. Access to firearms. Diet, exercise, and sleep habits. Work and work Statistician. Sunscreen use. Safety issues such as seatbelt and bike helmet use. Physical exam Your health care provider will check your: Height and weight. These may be used to calculate your BMI (body mass index). BMI is a measurement that tells if you are at a healthy weight. Waist circumference. This measures the distance around your waistline. This measurement also tells if you are at a healthy weight and may help predict your risk of certain diseases, such as type 2 diabetes and high blood pressure. Heart rate and blood pressure. Body temperature. Skin for abnormal spots. What immunizations do I need?  Vaccines are usually given at various ages, according to a schedule. Your health care provider will recommend vaccines for you based on your age, medical history, and lifestyle or other factors, such as travel or where you  work. What tests do I need? Screening Your health care provider may recommend screening tests for certain conditions. This may include: Lipid and cholesterol levels. Diabetes screening. This is done by checking your blood sugar (glucose) after you have not eaten for a while (fasting). Pelvic exam and Pap test. Hepatitis B test. Hepatitis C test. HIV (human immunodeficiency virus) test. STI (sexually transmitted infection) testing, if you are at risk. Lung cancer screening. Colorectal cancer screening. Mammogram. Talk with your health care provider about when you should start having regular mammograms. This may depend on whether you have a family history of breast cancer. BRCA-related cancer screening. This may be done if you have a family history of breast, ovarian, tubal, or peritoneal cancers. Bone density scan. This is done to screen for osteoporosis. Talk with your health care provider about your test results, treatment options, and if necessary, the need for more tests. Follow these instructions at home: Eating and drinking  Eat a diet that includes fresh fruits and vegetables, whole grains, lean protein, and low-fat dairy products. Take vitamin and mineral supplements as recommended by your health care provider. Do not drink alcohol if: Your health care provider tells you not to drink. You are pregnant, may be pregnant, or are planning to become pregnant. If you drink alcohol: Limit how much you have to 0-1 drink a day. Know how much alcohol is in your drink. In the [place] one drink equals one 12 oz bottle of beer (355 mL), one 5 oz glass of wine (148 mL), or one 1 oz glass of hard liquor (44 mL). Lifestyle Brush your teeth every morning and night with fluoride toothpaste. Floss one time each day. Exercise  for at least 30 minutes 5 or more days each week. Do not use any products that contain nicotine or tobacco. These products include cigarettes, chewing tobacco, and vaping  devices, such as e-cigarettes. If you need help quitting, ask your health care provider. Do not use drugs. If you are sexually active, practice safe sex. Use a condom or other form of protection to prevent STIs. If you do not wish to become pregnant, use a form of birth control. If you plan to become pregnant, see your health care provider for a prepregnancy visit. Take aspirin only as told by your health care provider. Make sure that you understand how much to take and what form to take. Work with your health care provider to find out whether it is safe and beneficial for you to take aspirin daily. Find healthy ways to manage stress, such as: Meditation, yoga, or listening to music. Journaling. Talking to a trusted person. Spending time with friends and family. Minimize exposure to UV radiation to reduce your risk of skin cancer. Safety Always wear your seat belt while driving or riding in a vehicle. Do not drive: If you have been drinking alcohol. Do not ride with someone who has been drinking. When you are tired or distracted. While texting. If you have been using any mind-altering substances or drugs. Wear a helmet and other protective equipment during sports activities. If you have firearms in your house, make sure you follow all gun safety procedures. Seek help if you have been physically or sexually abused. What's next? Visit your health care provider once a year for an annual wellness visit. Ask your health care provider how often you should have your eyes and teeth checked. Stay up to date on all vaccines. This information is not intended to replace advice given to you by your health care provider. Make sure you discuss any questions you have with your health care provider. Document Revised: 03/26/2021 Document Reviewed: 03/26/2021 Elsevier Patient Education  [place].

## 2022-05-21 NOTE — Assessment & Plan Note (Signed)
Discussed the importance of a healthy diet and regular exercise in order for weight loss, and to reduce the risk of further co-morbidity. ? ?Repeat A1C pending. ?

## 2022-05-21 NOTE — Assessment & Plan Note (Signed)
Controlled.  Continue venlafaxine ER 37.5 mg daily.

## 2022-06-02 ENCOUNTER — Other Ambulatory Visit: Payer: Self-pay | Admitting: Hematology and Oncology

## 2022-06-08 ENCOUNTER — Ambulatory Visit (INDEPENDENT_AMBULATORY_CARE_PROVIDER_SITE_OTHER): Payer: 59 | Admitting: Primary Care

## 2022-06-08 ENCOUNTER — Other Ambulatory Visit: Payer: Self-pay | Admitting: Primary Care

## 2022-06-08 VITALS — BP 120/76 | HR 86 | Temp 98.6°F | Ht 64.0 in | Wt 249.0 lb

## 2022-06-08 DIAGNOSIS — I10 Essential (primary) hypertension: Secondary | ICD-10-CM

## 2022-06-08 DIAGNOSIS — Z23 Encounter for immunization: Secondary | ICD-10-CM

## 2022-06-08 DIAGNOSIS — F419 Anxiety disorder, unspecified: Secondary | ICD-10-CM | POA: Diagnosis not present

## 2022-06-08 DIAGNOSIS — E1165 Type 2 diabetes mellitus with hyperglycemia: Secondary | ICD-10-CM | POA: Diagnosis not present

## 2022-06-08 LAB — BASIC METABOLIC PANEL
BUN: 22 mg/dL (ref 6–23)
CO2: 29 mEq/L (ref 19–32)
Calcium: 9.4 mg/dL (ref 8.4–10.5)
Chloride: 104 mEq/L (ref 96–112)
Creatinine, Ser: 0.77 mg/dL (ref 0.40–1.20)
GFR: 85.23 mL/min (ref >60.00)
Glucose, Bld: 141 mg/dL — ABNORMAL HIGH (ref 70–99)
Potassium: 4 mEq/L (ref 3.5–5.1)
Sodium: 138 mEq/L (ref 135–145)

## 2022-06-08 MED ORDER — LOSARTAN POTASSIUM-HCTZ 50-12.5 MG PO TABS: 1.0000 | ORAL_TABLET | Freq: Every day | ORAL | 3 refills | Status: DC

## 2022-06-08 MED ORDER — HYDROXYZINE HCL 10 MG PO TABS: ORAL_TABLET | ORAL | 0 refills | Status: AC

## 2022-06-08 NOTE — Patient Instructions (Addendum)
Stop by the lab prior to leaving today. I will notify you of your results once received.   Continue taking losartan-HCTZ 50-12.5 mg daily for blood pressure.  It is important that you improve your diet. Please limit carbohydrates in the form of white bread, rice, pasta, sweets, fast food, fried food, sugary drinks, etc. Increase your consumption of fresh fruits and vegetables, whole grains, lean protein.  Ensure you are consuming 64 ounces of water daily.  Please schedule a follow up visit for 3 months for diabetes check.  It was a pleasure to see you today!  Diabetes Mellitus and Nutrition, Adult When you have diabetes, or diabetes mellitus, it is very important to have healthy eating habits because your blood sugar (glucose) levels are greatly affected by what you eat and drink. Eating healthy foods in the right amounts, at about the same times every day, can help you: Manage your blood glucose. Lower your risk of heart disease. Improve your blood pressure. Reach or maintain a healthy weight. What can affect my meal plan? Every person with diabetes is different, and each person has different needs for a meal plan. Your health care provider may recommend that you work with a dietitian to make a meal plan that is best for you. Your meal plan may vary depending on factors such as: The calories you need. The medicines you take. Your weight. Your blood glucose, blood pressure, and cholesterol levels. Your activity level. Other health conditions you have, such as heart or kidney disease. How do carbohydrates affect me? Carbohydrates, also called carbs, affect your blood glucose level more than any other type of food. Eating carbs raises the amount of glucose in your blood. It is important to know how many carbs you can safely have in each meal. This is different for every person. Your dietitian can help you calculate how many carbs you should have at each meal and for each snack. How does  alcohol affect me? Alcohol can cause a decrease in blood glucose (hypoglycemia), especially if you use insulin or take certain diabetes medicines by mouth. Hypoglycemia can be a life-threatening condition. Symptoms of hypoglycemia, such as sleepiness, dizziness, and confusion, are similar to symptoms of having too much alcohol. Do not drink alcohol if: Your health care provider tells you not to drink. You are pregnant, may be pregnant, or are planning to become pregnant. If you drink alcohol: Limit how much you have to: 0-1 drink a day for women. 0-2 drinks a day for men. Know how much alcohol is in your drink. In the [place] one drink equals one 12 oz bottle of beer (355 mL), one 5 oz glass of wine (148 mL), or one 1 oz glass of hard liquor (44 mL). Keep yourself hydrated with water, diet soda, or unsweetened iced tea. Keep in mind that regular soda, juice, and other mixers may contain a lot of sugar and must be counted as carbs. What are tips for following this plan?  Reading food labels Start by checking the serving size on the Nutrition Facts label of packaged foods and drinks. The number of calories and the amount of carbs, fats, and other nutrients listed on the label are based on one serving of the item. Many items contain more than one serving per package. Check the total grams (g) of carbs in one serving. Check the number of grams of saturated fats and trans fats in one serving. Choose foods that have a low amount or none of these fats.  Check the number of milligrams (mg) of salt (sodium) in one serving. Most people should limit total sodium intake to less than 2,300 mg per day. Always check the nutrition information of foods labeled as "low-fat" or "nonfat." These foods may be higher in added sugar or refined carbs and should be avoided. Talk to your dietitian to identify your daily goals for nutrients listed on the label. Shopping Avoid buying canned, pre-made, or processed foods.  These foods tend to be high in fat, sodium, and added sugar. Shop around the outside edge of the grocery store. This is where you will most often find fresh fruits and vegetables, bulk grains, fresh meats, and fresh dairy products. Cooking Use low-heat cooking methods, such as baking, instead of high-heat cooking methods, such as deep frying. Cook using healthy oils, such as olive, canola, or sunflower oil. Avoid cooking with butter, cream, or high-fat meats. Meal planning Eat meals and snacks regularly, preferably at the same times every day. Avoid going long periods of time without eating. Eat foods that are high in fiber, such as fresh fruits, vegetables, beans, and whole grains. Eat 4-6 oz (112-168 g) of lean protein each day, such as lean meat, chicken, fish, eggs, or tofu. One ounce (oz) (28 g) of lean protein is equal to: 1 oz (28 g) of meat, chicken, or fish. 1 egg.  cup (62 g) of tofu. Eat some foods each day that contain healthy fats, such as avocado, nuts, seeds, and fish. What foods should I eat? Fruits Berries. Apples. Oranges. Peaches. Apricots. Plums. Grapes. Mangoes. Papayas. Pomegranates. Kiwi. Cherries. Vegetables Leafy greens, including lettuce, spinach, kale, chard, collard greens, mustard greens, and cabbage. Beets. Cauliflower. Broccoli. Carrots. Green beans. Tomatoes. Peppers. Onions. Cucumbers. Brussels sprouts. Grains Whole grains, such as whole-wheat or whole-grain bread, crackers, tortillas, cereal, and pasta. Unsweetened oatmeal. Quinoa. Brown or wild rice. Meats and other proteins Seafood. Poultry without skin. Lean cuts of poultry and beef. Tofu. Nuts. Seeds. Dairy Low-fat or fat-free dairy products such as milk, yogurt, and cheese. The items listed above may not be a complete list of foods and beverages you can eat and drink. Contact a dietitian for more information. What foods should I avoid? Fruits Fruits canned with syrup. Vegetables Canned vegetables.  Frozen vegetables with butter or cream sauce. Grains Refined white flour and flour products such as bread, pasta, snack foods, and cereals. Avoid all processed foods. Meats and other proteins Fatty cuts of meat. Poultry with skin. Breaded or fried meats. Processed meat. Avoid saturated fats. Dairy Full-fat yogurt, cheese, or milk. Beverages Sweetened drinks, such as soda or iced tea. The items listed above may not be a complete list of foods and beverages you should avoid. Contact a dietitian for more information. Questions to ask a health care provider Do I need to meet with a certified diabetes care and education specialist? Do I need to meet with a dietitian? What number can I call if I have questions? When are the best times to check my blood glucose? Where to find more information: American Diabetes Association: diabetes.org Academy of Nutrition and Dietetics: eatright.[employer] of Diabetes and Digestive and Kidney Diseases: [employer] Association of Diabetes Care & Education Specialists: diabeteseducator.org Summary It is important to have healthy eating habits because your blood sugar (glucose) levels are greatly affected by what you eat and drink. It is important to use alcohol carefully. A healthy meal plan will help you manage your blood glucose and lower your risk of heart  disease. Your health care provider may recommend that you work with a dietitian to make a meal plan that is best for you. This information is not intended to replace advice given to you by your health care provider. Make sure you discuss any questions you have with your health care provider. Document Revised: 05/01/2020 Document Reviewed: 05/01/2020 Elsevier Patient Education  [place].

## 2022-06-08 NOTE — Assessment & Plan Note (Signed)
New diagnosis, recent A1C of 6.5.  Long discussion regarding the pathophysiology, diagnosis, and treatment plan for diabetes.  Discussed options for treatment including metformin vs lifestyle changes.  She is motivated to work on weight loss and improve her diabetes with lifestyle changes.  Long discussion regarding diet and exercise needed.  Foot exam today. Managed on ACE-I. Consider statin next visit.  Follow up in 3 months.

## 2022-06-08 NOTE — Progress Notes (Signed)
Subjective:    Patient ID: Virginia Oneal, female    DOB: August 20, 1966, 56 y.o.   MRN: [id number]  Hypertension Pertinent negatives include no chest pain, headaches or shortness of breath.    Virginia Oneal is a very pleasant 56 y.o. female with a history of hypertension, diffuse large B-cell lymphoma, hot flashes who presents today for follow-up of hypertension and new onset type 2 diabetes.  She is also needing a refill of her hydroxyzine.  1) Essential Hypertension: She was last evaluated on 05/21/2022 for her routine physical when her blood pressure was noted to be above goal during this visit and several prior visits despite management on HCTZ 25 mg.  During this visit we lowered her HCTZ to 12.5 mg and added losartan 50 mg daily.  She is here for follow-up today.  Since her last visit she is compliant to losartan-hydrochlorothiazide 50-12.5 mg daily. She's been checking BP at home which have been running in the 130's-140's/80's. She denies headaches, dizziness, chest pain.   BP Readings from Last 3 Encounters:  06/08/22 120/76  05/21/22 (!) 146/74  04/10/22 (!) 160/82   2) Type 2 Diabetes:  Current medications include: None.  Last A1C: 5.7 in October 2022, 6.5 in August 2023 Last Eye Exam: Due Last Foot Exam: Completed today Pneumonia Vaccination: Urine Microalbumin: None.  Managed on ARB. Statin: None.   Dietary changes since last visit: Historically drinking a lot of orange juice, eating a lot of sweets.    Exercise: None.    Review of Systems  Eyes:  Negative for visual disturbance.  Respiratory:  Negative for shortness of breath.   Cardiovascular:  Negative for chest pain.  Neurological:  Negative for dizziness, numbness and headaches.         Past Medical History:  Diagnosis Date   Abnormal uterine bleeding    Anemia    as a teenager   Anxiety    postpartum and hx panic attacks   Arrhythmia    Arthritis    back and hands   Chest mass    by CT  scanning   Chest pain 76/54/6503   Complication of anesthesia    slow to wake up   COVID-19 10/2020   Depression    DOE (dyspnea on exertion) 07/26/2014   Family history of adverse reaction to anesthesia    mom slow to wake up   Gestational diabetes    resolved after pregnancy   History of kidney stones    Hypertension    Personal history of colonic polyps    PONV (postoperative nausea and vomiting)    PREMATURE ATRIAL CONTRACTIONS 08/15/2008   Qualifier: Diagnosis of  By: Council Mechanic MD, Hilaria Ota    RENAL CALCULUS, HX OF 08/15/2008   Qualifier: Diagnosis of  By: Zara Council LPN, Wanda     Rotator cuff tendinitis 06/29/2011   Seasonal allergies    Urinary incontinence 05/2009   hx bladder sling--Dr. Quincy Simmonds    Social History   Socioeconomic History   Marital status: Married    Spouse name: Not on file   Number of children: 2   Years of education: Not on file   Highest education level: Not on file  Occupational History   Occupation: [employer]  Tobacco Use   Smoking status: Former    Years: 10.00    Types: Cigarettes    Quit date: 01/06/1999    Years since quitting: 23.4   Smokeless tobacco: Never  Vaping Use  Vaping Use: Never used  Substance and Sexual Activity   Alcohol use: No    Alcohol/week: 0.0 standard drinks of alcohol   Drug use: No   Sexual activity: Yes    Partners: Male    Birth control/protection: Other-see comments, Surgical    Comment: TAH/LSO  Other Topics Concern   Not on file  Social History Narrative   ** Merged History Encounter **       Social Determinants of Health   Financial Resource Strain: Not on file  Food Insecurity: Not on file  Transportation Needs: Not on file  Physical Activity: Not on file  Stress: Not on file  Social Connections: Not on file  Intimate Partner Violence: Not on file    Past Surgical History:  Procedure Laterality Date   ABDOMINAL HYSTERECTOMY  05/2009   TAH/LSO   BLADDER SUSPENSION  2010   Dr. Quincy Simmonds    BREAST BIOPSY     left stereo   CESAREAN SECTION  03/2003   CESAREAN SECTION  2004   CHOLECYSTECTOMY N/A 01/02/2021   Procedure: LAPAROSCOPIC CHOLECYSTECTOMY;  Surgeon: Rolm Bookbinder, MD;  Location: [facility];  Service: General;  Laterality: N/A;  58  - GEN AND TAP BLOCK LDOW   COLONOSCOPY  09/08   COLPORRHAPHY  05/2009   Dr. Quincy Simmonds   ENDOMETRIAL ABLATION  2006   Dr. Quincy Simmonds   INCONTINENCE SURGERY     sling for urinary incontinence   IR IMAGING GUIDED PORT INSERTION  09/01/2021   KYPHOPLASTY N/A 04/07/2021   Procedure: Thoracic 12 Vertebral body biopsy;  Surgeon: Kristeen Miss, MD;  Location: [place];  Service: Neurosurgery;  Laterality: N/A;   LAPAROSCOPIC ENDOMETRIOSIS FULGURATION     ablation   LUMBAR LAMINECTOMY/DECOMPRESSION MICRODISCECTOMY N/A 07/31/2021   Procedure: Open Transpedicular biopsy of Thoracic twelve;  Surgeon: Kristeen Miss, MD;  Location: [place];  Service: Neurosurgery;  Laterality: N/A;   OOPHORECTOMY  2010   w/hyst--Dr. Quincy Simmonds   TUBAL LIGATION  2004   w/ C-section Dr. Quincy Simmonds   TYMPANOPLASTY     right ear   VAGINAL HYSTERECTOMY  05/21/2009   lap assisted /partial    Family History  Problem Relation Age of Onset   Kidney disease Mother        renal failure   Stroke Mother    Diabetes Mother    Hypertension Mother    Heart failure Mother    Colon cancer Mother    Stroke Father    Cancer - Colon Maternal Aunt    Hypertension Sister    Rheum arthritis Sister    Hypertension Brother    Hypertension Sister    Hypertension Sister    Hypertension Sister    Breast cancer Sister    Kidney cancer Sister    Esophageal cancer Neg Hx    Rectal cancer Neg Hx    Stomach cancer Neg Hx     Allergies  Allergen Reactions   Buspar [Buspirone Hcl]     Worsened her panic attacks    Current Outpatient Medications on File Prior to Visit  Medication Sig Dispense Refill   acetaminophen (TYLENOL) 500 MG tablet Take 1,000 mg by mouth every 6 (six) hours as needed for  moderate pain or headache.     b complex vitamins capsule Take 1 capsule by mouth daily.     cetirizine (ZYRTEC) 10 MG tablet Take 10 mg by mouth daily.      Cholecalciferol (VITAMIN D3) 125 MCG (5000 UT) CAPS Take 5,000 Units  by mouth daily.     hydrOXYzine (ATARAX/VISTARIL) 10 MG tablet Take 1 or 2 tablets by mouth as needed for anxiety attack. 30 tablet 0   lidocaine-prilocaine (EMLA) cream Apply to affected area once 30 g 3   losartan-hydrochlorothiazide (HYZAAR) 50-12.5 MG tablet Take 1 tablet by mouth daily. for blood pressure. 30 tablet 0   Methylcobalamin (B-12) 5000 MCG TBDP Take 5,000 mcg by mouth daily.     venlafaxine XR (EFFEXOR-XR) 37.5 MG 24 hr capsule Take 1 capsule (37.5 mg total) by mouth daily with breakfast. For hot flashes and anxiety. Office visit required for further refills. 90 capsule 0   No current facility-administered medications on file prior to visit.    BP 120/76   Pulse 86   Temp 98.6 F (37 C) (Oral)   Ht '5\' 4"'$  (1.626 m)   Wt 249 lb (112.9 kg)   SpO2 97%   BMI 42.74 kg/m  Objective:   Physical Exam Cardiovascular:     Rate and Rhythm: Normal rate and regular rhythm.  Pulmonary:     Effort: Pulmonary effort is normal.     Breath sounds: Normal breath sounds.  Musculoskeletal:     Cervical back: Neck supple.  Skin:    General: Skin is warm and dry.           Assessment & Plan:   Problem List Items Addressed This Visit       Cardiovascular and Mediastinum   Essential hypertension - Primary    Controlled.  Continue losartan-hydrochlorothiazide 50-12.5 mg daily. Repeat BMP pending.      Relevant Orders   Basic metabolic panel     Endocrine   Type 2 diabetes mellitus with hyperglycemia (HCC)    New diagnosis, recent A1C of 6.5.  Long discussion regarding the pathophysiology, diagnosis, and treatment plan for diabetes.  Discussed options for treatment including metformin vs lifestyle changes.  She is motivated to work on weight  loss and improve her diabetes with lifestyle changes.  Long discussion regarding diet and exercise needed.  Foot exam today. Managed on ACE-I. Consider statin next visit.  Follow up in 3 months.          Pleas Koch, NP

## 2022-06-08 NOTE — Assessment & Plan Note (Signed)
Controlled.  Continue losartan-hydrochlorothiazide 50-12.5 mg daily. Repeat BMP pending.

## 2022-06-10 ENCOUNTER — Ambulatory Visit: Payer: 59 | Admitting: Primary Care

## 2022-07-10 ENCOUNTER — Inpatient Hospital Stay: Payer: 59 | Attending: Hematology

## 2022-07-10 ENCOUNTER — Inpatient Hospital Stay (HOSPITAL_BASED_OUTPATIENT_CLINIC_OR_DEPARTMENT_OTHER): Payer: 59 | Admitting: Hematology

## 2022-07-10 VITALS — BP 133/86 | HR 87 | Temp 97.5°F | Resp 16 | Ht 64.0 in | Wt 250.2 lb

## 2022-07-10 DIAGNOSIS — Z803 Family history of malignant neoplasm of breast: Secondary | ICD-10-CM | POA: Insufficient documentation

## 2022-07-10 DIAGNOSIS — Z8051 Family history of malignant neoplasm of kidney: Secondary | ICD-10-CM | POA: Diagnosis not present

## 2022-07-10 DIAGNOSIS — I10 Essential (primary) hypertension: Secondary | ICD-10-CM | POA: Insufficient documentation

## 2022-07-10 DIAGNOSIS — Z95828 Presence of other vascular implants and grafts: Secondary | ICD-10-CM

## 2022-07-10 DIAGNOSIS — C8330 Diffuse large B-cell lymphoma, unspecified site: Secondary | ICD-10-CM | POA: Diagnosis not present

## 2022-07-10 DIAGNOSIS — Z800 Family history of malignant neoplasm of digestive organs: Secondary | ICD-10-CM | POA: Insufficient documentation

## 2022-07-10 DIAGNOSIS — Z87891 Personal history of nicotine dependence: Secondary | ICD-10-CM | POA: Insufficient documentation

## 2022-07-10 DIAGNOSIS — Z90710 Acquired absence of both cervix and uterus: Secondary | ICD-10-CM | POA: Insufficient documentation

## 2022-07-10 DIAGNOSIS — C8512 Unspecified B-cell lymphoma, intrathoracic lymph nodes: Secondary | ICD-10-CM | POA: Insufficient documentation

## 2022-07-10 LAB — CMP (CANCER CENTER ONLY)
ALT: 26 U/L (ref 0–44)
AST: 21 U/L (ref 15–41)
Albumin: 4.1 g/dL (ref 3.5–5.0)
Alkaline Phosphatase: 107 U/L (ref 38–126)
Anion gap: 4 — ABNORMAL LOW (ref 5–15)
BUN: 17 mg/dL (ref 6–20)
CO2: 30 mmol/L (ref 22–32)
Calcium: 9.5 mg/dL (ref 8.9–10.3)
Chloride: 104 mmol/L (ref 98–111)
Creatinine: 0.68 mg/dL (ref 0.44–1.00)
GFR, Estimated: >60 mL/min (ref >60)
Glucose, Bld: 104 mg/dL — ABNORMAL HIGH (ref 70–99)
Potassium: 3.7 mmol/L (ref 3.5–5.1)
Sodium: 138 mmol/L (ref 135–145)
Total Bilirubin: 0.5 mg/dL (ref 0.3–1.2)
Total Protein: 6.5 g/dL (ref 6.5–8.1)

## 2022-07-10 LAB — CBC WITH DIFFERENTIAL (CANCER CENTER ONLY)
Abs Immature Granulocytes: 0.02 10*3/uL (ref 0.00–0.07)
Basophils Absolute: 0 10*3/uL (ref 0.0–0.1)
Basophils Relative: 1 %
Eosinophils Absolute: 0.1 10*3/uL (ref 0.0–0.5)
Eosinophils Relative: 3 %
HCT: 36.3 % (ref 36.0–46.0)
Hemoglobin: 12.6 g/dL (ref 12.0–15.0)
Immature Granulocytes: 0 %
Lymphocytes Relative: 21 %
Lymphs Abs: 1 10*3/uL (ref 0.7–4.0)
MCH: 30.9 pg (ref 26.0–34.0)
MCHC: 34.7 g/dL (ref 30.0–36.0)
MCV: 89 fL (ref 80.0–100.0)
Monocytes Absolute: 0.6 10*3/uL (ref 0.1–1.0)
Monocytes Relative: 13 %
Neutro Abs: 3 10*3/uL (ref 1.7–7.7)
Neutrophils Relative %: 62 %
Platelet Count: 148 10*3/uL — ABNORMAL LOW (ref 150–400)
RBC: 4.08 MIL/uL (ref 3.87–5.11)
RDW: 13.2 % (ref 11.5–15.5)
WBC Count: 4.8 10*3/uL (ref 4.0–10.5)
nRBC: 0 % (ref 0.0–0.2)

## 2022-07-10 LAB — LACTATE DEHYDROGENASE: LDH: 180 U/L (ref 98–192)

## 2022-07-10 MED ORDER — SODIUM CHLORIDE 0.9% FLUSH
10.0000 mL | Freq: Once | INTRAVENOUS | Status: AC
  Administered 2022-07-10: 10 mL

## 2022-07-10 MED ORDER — HEPARIN SOD (PORK) LOCK FLUSH 100 UNIT/ML IV SOLN
500.0000 [IU] | Freq: Once | INTRAVENOUS | Status: AC
  Administered 2022-07-10: 500 [IU]

## 2022-07-17 ENCOUNTER — Encounter: Payer: Self-pay | Admitting: Hematology

## 2022-07-17 NOTE — Progress Notes (Signed)
[name]   HEMATOLOGY/ONCOLOGY CLINIC NOTE  Date of Service: .07/10/2022    Patient Care Team: Pleas Koch, NP as PCP - General (Internal Medicine) Kristeen Miss, MD as Consulting Physician (Neurosurgery) Rolm Bookbinder, MD as Consulting Physician (General Surgery)  CHIEF COMPLAINTS/PURPOSE OF CONSULTATION:  Follow-up for continued evaluation and management of large B-cell lymphoma and for PET CT review after 6 cycles of R-CHOP chemotherapy  HISTORY OF PRESENTING ILLNESS:  Please see previous note for details of initial presentation  INTERVAL HISTORY  Virginia Oneal is a 56 y.o. female is here for follow-up of her diffuse large B-cell lymphoma. She notes no acute new symptoms since her last clinic visit.  No new back pain.  No new lumps or bumps.  No new skin rashes.  No unexpected fevers chills night sweats or unexpected weight loss.  Labs done today were discussed in detail with the patient. MEDICAL HISTORY:  Past Medical History:  Diagnosis Date   Abnormal uterine bleeding    Anemia    as a teenager   Anxiety    postpartum and hx panic attacks   Arrhythmia    Arthritis    back and hands   Chest mass    by CT scanning   Chest pain 14/97/0263   Complication of anesthesia    slow to wake up   COVID-19 10/2020   Depression    DOE (dyspnea on exertion) 07/26/2014   Family history of adverse reaction to anesthesia    mom slow to wake up   Gestational diabetes    resolved after pregnancy   History of kidney stones    Hypertension    Personal history of colonic polyps    PONV (postoperative nausea and vomiting)    PREMATURE ATRIAL CONTRACTIONS 08/15/2008   Qualifier: Diagnosis of  By: Council Mechanic MD, Hilaria Ota    RENAL CALCULUS, HX OF 08/15/2008   Qualifier: Diagnosis of  By: Zara Council LPN, Wanda     Rotator cuff tendinitis 06/29/2011   Seasonal allergies    Urinary incontinence 05/2009   hx bladder sling--Dr. Quincy Simmonds    SURGICAL HISTORY: Past Surgical History:   Procedure Laterality Date   ABDOMINAL HYSTERECTOMY  05/2009   TAH/LSO   BLADDER SUSPENSION  2010   Dr. Quincy Simmonds   BREAST BIOPSY     left stereo   CESAREAN SECTION  03/2003   CESAREAN SECTION  2004   CHOLECYSTECTOMY N/A 01/02/2021   Procedure: LAPAROSCOPIC CHOLECYSTECTOMY;  Surgeon: Rolm Bookbinder, MD;  Location: [facility];  Service: General;  Laterality: N/A;  58  - GEN AND TAP BLOCK LDOW   COLONOSCOPY  09/08   COLPORRHAPHY  05/2009   Dr. Quincy Simmonds   ENDOMETRIAL ABLATION  2006   Dr. Quincy Simmonds   INCONTINENCE SURGERY     sling for urinary incontinence   IR IMAGING GUIDED PORT INSERTION  09/01/2021   KYPHOPLASTY N/A 04/07/2021   Procedure: Thoracic 12 Vertebral body biopsy;  Surgeon: Kristeen Miss, MD;  Location: [place];  Service: Neurosurgery;  Laterality: N/A;   LAPAROSCOPIC ENDOMETRIOSIS FULGURATION     ablation   LUMBAR LAMINECTOMY/DECOMPRESSION MICRODISCECTOMY N/A 07/31/2021   Procedure: Open Transpedicular biopsy of Thoracic twelve;  Surgeon: Kristeen Miss, MD;  Location: [place];  Service: Neurosurgery;  Laterality: N/A;   OOPHORECTOMY  2010   w/hyst--Dr. Quincy Simmonds   TUBAL LIGATION  2004   w/ C-section Dr. Quincy Simmonds   TYMPANOPLASTY     right ear   VAGINAL HYSTERECTOMY  05/21/2009   lap assisted /  partial    SOCIAL HISTORY: Social History   Socioeconomic History   Marital status: Married    Spouse name: Not on file   Number of children: 2   Years of education: Not on file   Highest education level: Not on file  Occupational History   Occupation: [employer]  Tobacco Use   Smoking status: Former    Years: 10.00    Types: Cigarettes    Quit date: 01/06/1999    Years since quitting: 23.5   Smokeless tobacco: Never  Vaping Use   Vaping Use: Never used  Substance and Sexual Activity   Alcohol use: No    Alcohol/week: 0.0 standard drinks of alcohol   Drug use: No   Sexual activity: Yes    Partners: Male    Birth control/protection: Other-see comments, Surgical    Comment:  TAH/LSO  Other Topics Concern   Not on file  Social History Narrative   ** Merged History Encounter **       Social Determinants of Health   Financial Resource Strain: Not on file  Food Insecurity: Not on file  Transportation Needs: Not on file  Physical Activity: Not on file  Stress: Not on file  Social Connections: Not on file  Intimate Partner Violence: Not on file    FAMILY HISTORY: Family History  Problem Relation Age of Onset   Kidney disease Mother        renal failure   Stroke Mother    Diabetes Mother    Hypertension Mother    Heart failure Mother    Colon cancer Mother    Stroke Father    Cancer - Colon Maternal Aunt    Hypertension Sister    Rheum arthritis Sister    Hypertension Brother    Hypertension Sister    Hypertension Sister    Hypertension Sister    Breast cancer Sister    Kidney cancer Sister    Esophageal cancer Neg Hx    Rectal cancer Neg Hx    Stomach cancer Neg Hx     ALLERGIES:  is allergic to buspar [buspirone hcl].  MEDICATIONS:  Current Outpatient Medications  Medication Sig Dispense Refill   acetaminophen (TYLENOL) 500 MG tablet Take 1,000 mg by mouth every 6 (six) hours as needed for moderate pain or headache.     b complex vitamins capsule Take 1 capsule by mouth daily.     cetirizine (ZYRTEC) 10 MG tablet Take 10 mg by mouth daily.      Cholecalciferol (VITAMIN D3) 125 MCG (5000 UT) CAPS Take 5,000 Units by mouth daily.     hydrOXYzine (ATARAX) 10 MG tablet Take 1 or 2 tablets by mouth as needed for anxiety attack. 15 tablet 0   lidocaine-prilocaine (EMLA) cream Apply to affected area once 30 g 3   losartan-hydrochlorothiazide (HYZAAR) 50-12.5 MG tablet Take 1 tablet by mouth daily. for blood pressure. 90 tablet 3   Methylcobalamin (B-12) 5000 MCG TBDP Take 5,000 mcg by mouth daily.     venlafaxine XR (EFFEXOR-XR) 37.5 MG 24 hr capsule Take 1 capsule (37.5 mg total) by mouth daily with breakfast. For hot flashes and anxiety.  Office visit required for further refills. 90 capsule 0   No current facility-administered medications for this visit.    REVIEW OF SYSTEMS:   10 Point review of Systems was done is negative except as noted above.  PHYSICAL EXAMINATION: .BP 133/86 (BP Location: Left Arm, Patient Position: Sitting)   Pulse 87  Temp (!) 97.5 F (36.4 C) (Temporal)   Resp 16   Ht _0  (1.626 m)   Wt 250 lb 3.2 oz (113.5 kg)   SpO2 96%   BMI 42.95 kg/m  NAD GENERAL:alert, in no acute distress and comfortable SKIN: no acute rashes, no significant lesions EYES: conjunctiva are pink and non-injected, sclera anicteric OROPHARYNX: MMM, no exudates, no oropharyngeal erythema or ulceration NECK: supple, no JVD LYMPH:  no palpable lymphadenopathy in the cervical, axillary or inguinal regions LUNGS: clear to auscultation b/l with normal respiratory effort HEART: regular rate & rhythm ABDOMEN:  normoactive bowel sounds , non tender, not distended. Extremity: no pedal edema PSYCH: alert & oriented x 3 with fluent speech NEURO: no focal motor/sensory deficits  LABORATORY DATA:  I have reviewed the data as listed  .    Latest Ref Rng & Units 07/10/2022   12:30 PM 04/10/2022    8:20 AM 02/11/2022   10:12 AM  CBC  WBC 4.0 - 10.5 K/uL 4.8  3.5  4.6   Hemoglobin 12.0 - 15.0 g/dL 12.6  12.5  11.9   Hematocrit 36.0 - 46.0 % 36.3  37.1  34.7   Platelets 150 - 400 K/uL 148  131  163    .    Latest Ref Rng & Units 07/10/2022   12:30 PM 06/08/2022   10:50 AM 05/21/2022   11:49 AM  CMP  Glucose 70 - 99 mg/dL 104  141  150   BUN 6 - 20 mg/dL _1 Creatinine 0.44 - 1.00 mg/dL 0.68  0.77  0.79   Sodium 135 - 145 mmol/L 138  138  140   Potassium 3.5 - 5.1 mmol/L 3.7  4.0  4.0   Chloride 98 - 111 mmol/L 104  104  101   CO2 22 - 32 mmol/L 30  29  33   Calcium 8.9 - 10.3 mg/dL 9.5  9.4  9.6   Total Protein 6.5 - 8.1 g/dL 6.5     Total Bilirubin 0.3 - 1.2 mg/dL 0.5     Alkaline Phos 38 - 126 U/L 107      AST 15 - 41 U/L 21     ALT 0 - 44 U/L 26      Lab Results  Component Value Date   LDH 180 07/10/2022    HIV Screen 4th Generation wRfx     Non Reactive Non Reactive  Hepatitis B Surface Ag     NON REACTIVE NON REACTIVE  Hep B Core Total Ab     NON REACTIVE NON REACTIVE  HCV Ab     NON REACTIVE NON REACTIVE  LDH     98 - 192 U/L 228 (H)    SURGICAL PATHOLOGY  CASE: [id number]  PATIENT: Gissel Jentsch  Surgical Pathology Report   Clinical History: lesion of thoracic vertebra (cm)   FINAL MICROSCOPIC DIAGNOSIS:   A-C. BONE, T12 VERTEBRAL BODY AND MARROW, BIOPSY:  -  Non-Hodgkin B-cell lymphoma, high grade  -  See comment   COMMENT:   A-C.  The biopsies show normal hematopoeisis admixed with crush artifact  with associated marrow fibrosis and an atypical cellular infiltrate  composed of medium to large cells with irregular nuclear contours, ample  cytoplasm, variably prominent nucleoli, increased mitoses and apoptosis.  An initial panel of immunohistochemistry (cytokeratin AE1/3, cytokeratin  7, cytokeratin 20, CD20 and CD3) confirmed the morphologic impression  that the atypical cells were lymphoid in nature.  The atypical  lymphocytes are positive for Pax-5, CD20, CD10, bcl-6 but negative for  CD30, bcl-2, mum-1 and EBV by in-situ hybridization. CD3 and CD5  highlight background T-cells.  The proliferative rate by ki-67 is  increased (50-60%).  Overall, the findings are consistent with a  non-Hodgkin B-cell lymphoma, high grade with a germinal center  phenotype.   FISH studies are pending and will be reported in an  addendum.  These findings were discussed with Dr. Ellene Route on August 06, 2021.    RADIOGRAPHIC STUDIES: I have personally reviewed the radiological images as listed and agreed with the findings in the report.  ECHO 09/08/2021 1.Left ventricular ejection fraction, by estimation, is 55 to 60%. The left ventricle has normal function. The left  ventricle has no regional wall motion abnormalities. There ismild left ventricular hypertrophy. Left ventricular diastolic parameters were normal.The average left ventricular global longitudinal strain is -20.4 %. The global longitudinal strain is normal. 2. Right ventricular systolic function is normal. The right ventricular size is normal. 3. Left atrial size was mildly dilated. The mitral valve is normal in structure. Trivial mitral valve regurgitation. No evidence of mitral stenosis. Moderate mitral annular calcification. 4.The aortic valve is normal in structure. Aortic valve regurgitation is not visualized. No aortic stenosis is present. 5.The inferior vena cava is normal in size with greater than 50% respiratory variability, suggesting right atrial pressure of 3 mmHg.  No results found.  ASSESSMENT & PLAN:   56 year old female with  1) Stage IVA high-grade large B cell non-Hodgkin's lymphoma.  Germinal center phenotype.  C-Myc not rearranged.  Ki-67 50 to 60% No overt constitutional symptoms PET/CT with involvement of thoracic spine and acromion process on the right.  Abdominal lymphadenopathy.  Hepatitis testing unremarkable HIV nonreactive Myeloma panel shows no M spike protein spike.  Echo cardiogram shows normal ejection fraction  PET CT scan done on 09/02/2021 shows Scattered mild hypermetabolism within subcentimeter periportal, retroperitoneal and mesenteric lymph nodes, compatible with the given history of large B-cell lymphoma. 2. Hypermetabolism within known T7 and T12 lesions, better seen on MR thoracic spine 07/04/2021. 3. Focal hypermetabolism in the acromion of the right scapula, without a definite CT correlate. Recommend attention on follow-up.  2) Chemotherapy related neutropenia -now resolved.  No issues with infections.    3) G-CSF related significant bone pain.  PLAN -Patient's labs done today were discussed in detail with her  CBC within normal limits with  hemoglobin of 12.6, WBC count of 4.8k and platelets of 148k CMP stable LDH within normal limits at 180 Patient has no lab or clinical evidence of lymphoma progression at this time. We will get a PET CT scan prior to her next follow-up in 3 months to reevaluate her FDG avid T12 lesion for resolution after radiation.  Follow-up PET/CT in12 weeks  return to clinic with Dr. Irene Limbo with labs in 14 weeks  The total time spent in the appointment was 21 minutes*.  All of the patient's questions were answered with apparent satisfaction. The patient knows to call the clinic with any problems, questions or concerns.   Sullivan Lone MD MS AAHIVMS [facility] Hematology/Oncology Physician [facility]  .*Total Encounter Time as defined by the Centers for Medicare and Medicaid Services includes, in addition to the face-to-face time of a patient visit (documented in the note above) non-face-to-face time: obtaining and reviewing outside history, ordering and reviewing medications, tests or procedures, care coordination (communications with other health care professionals or caregivers)  and documentation in the medical record.

## 2022-07-28 ENCOUNTER — Other Ambulatory Visit: Payer: Self-pay | Admitting: Primary Care

## 2022-07-28 DIAGNOSIS — F419 Anxiety disorder, unspecified: Secondary | ICD-10-CM

## 2022-07-28 DIAGNOSIS — R232 Flushing: Secondary | ICD-10-CM

## 2022-09-08 ENCOUNTER — Encounter: Payer: Self-pay | Admitting: Primary Care

## 2022-09-08 ENCOUNTER — Ambulatory Visit (INDEPENDENT_AMBULATORY_CARE_PROVIDER_SITE_OTHER): Payer: 59 | Admitting: Primary Care

## 2022-09-08 VITALS — BP 138/84 | HR 95 | Temp 96.6°F | Ht 64.0 in | Wt 250.0 lb

## 2022-09-08 DIAGNOSIS — E1165 Type 2 diabetes mellitus with hyperglycemia: Secondary | ICD-10-CM

## 2022-09-08 LAB — MICROALBUMIN / CREATININE URINE RATIO
Creatinine,U: 142.9 mg/dL
Microalb Creat Ratio: 0.8 mg/g (ref 0.0–30.0)
Microalb, Ur: 1.2 mg/dL (ref 0.0–1.9)

## 2022-09-08 LAB — HEMOGLOBIN A1C: Hgb A1c MFr Bld: 6 % (ref 4.6–6.5)

## 2022-09-08 NOTE — Assessment & Plan Note (Signed)
Repeat A1C pending today.   Encouraged her to work on her diet, limit sweets. Continue regular activity.  We did discuss initiation of metformin XR 500 mg daily if glucose readings are still above goal, she agrees if warranted. Discussed potential side effects.   Urine microalbumin due and pending. Foot exam UTD.  Follow up in 3 months.

## 2022-09-08 NOTE — Progress Notes (Signed)
Subjective:    Patient ID: Virginia Oneal, female    DOB: [birth year], 56 y.o.   MRN: [id number]  HPI  Virginia Oneal is a very pleasant 56 y.o. female with a history of type 2 diabetes, hypertension, diffuse large B cell lymphoma who presents today for follow up of diabetes.  New diagnosis of type 2 diabetes in August 2023 with A1C of 6.5. prior history of prediabetes one year prior.   Current medications include: None. During her last visit she wanted to work on lifestyle changes to manage diabetes.   She is checking her blood glucose 0 times daily.  Last A1C: 6.5 in August 2023 Last Eye Exam: Due Last Foot Exam: UTD Pneumonia Vaccination: never completed Urine Microalbumin: Due Statin: None.  Dietary changes since last visit: She continues to eat sweets daily. She has increased her intake of water. She drinks sweet tea with Splenda.    Exercise: She began walking most days of the week.    BP Readings from Last 3 Encounters:  09/08/22 138/84  07/10/22 133/86  06/08/22 120/76       Review of Systems  Eyes:  Negative for visual disturbance.  Cardiovascular:  Negative for chest pain.  Neurological:  Negative for numbness.         Past Medical History:  Diagnosis Date   Abnormal uterine bleeding    Anemia    as a teenager   Anxiety    postpartum and hx panic attacks   Arrhythmia    Arthritis    back and hands   Chest mass    by CT scanning   Chest pain 57/84/6962   Complication of anesthesia    slow to wake up   COVID-19 10/2020   Depression    DOE (dyspnea on exertion) 07/26/2014   Family history of adverse reaction to anesthesia    mom slow to wake up   Gestational diabetes    resolved after pregnancy   History of kidney stones    Hypertension    Personal history of colonic polyps    PONV (postoperative nausea and vomiting)    PREMATURE ATRIAL CONTRACTIONS 08/15/2008   Qualifier: Diagnosis of  By: Council Mechanic MD, Hilaria Ota    RENAL CALCULUS,  HX OF 08/15/2008   Qualifier: Diagnosis of  By: Zara Council LPN, Wanda     Rotator cuff tendinitis 06/29/2011   Seasonal allergies    Urinary incontinence 05/2009   hx bladder sling--Dr. Quincy Simmonds    Social History   Socioeconomic History   Marital status: Married    Spouse name: Not on file   Number of children: 2   Years of education: Not on file   Highest education level: Not on file  Occupational History   Occupation: [employer]  Tobacco Use   Smoking status: Former    Years: 10.00    Types: Cigarettes    Quit date: 01/06/1999    Years since quitting: 23.6   Smokeless tobacco: Never  Vaping Use   Vaping Use: Never used  Substance and Sexual Activity   Alcohol use: No    Alcohol/week: 0.0 standard drinks of alcohol   Drug use: No   Sexual activity: Yes    Partners: Male    Birth control/protection: Other-see comments, Surgical    Comment: TAH/LSO  Other Topics Concern   Not on file  Social History Narrative   ** Merged History Encounter **       Social Determinants of Health  Financial Resource Strain: Not on file  Food Insecurity: Not on file  Transportation Needs: Not on file  Physical Activity: Not on file  Stress: Not on file  Social Connections: Not on file  Intimate Partner Violence: Not on file    Past Surgical History:  Procedure Laterality Date   ABDOMINAL HYSTERECTOMY  05/2009   TAH/LSO   BLADDER SUSPENSION  2010   Dr. Quincy Simmonds   BREAST BIOPSY     left stereo   CESAREAN SECTION  03/2003   CESAREAN SECTION  2004   CHOLECYSTECTOMY N/A 01/02/2021   Procedure: LAPAROSCOPIC CHOLECYSTECTOMY;  Surgeon: Rolm Bookbinder, MD;  Location: [facility];  Service: General;  Laterality: N/A;  58  - GEN AND TAP BLOCK LDOW   COLONOSCOPY  09/08   COLPORRHAPHY  05/2009   Dr. Quincy Simmonds   ENDOMETRIAL ABLATION  2006   Dr. Quincy Simmonds   INCONTINENCE SURGERY     sling for urinary incontinence   IR IMAGING GUIDED PORT INSERTION  09/01/2021   KYPHOPLASTY N/A 04/07/2021    Procedure: Thoracic 12 Vertebral body biopsy;  Surgeon: Kristeen Miss, MD;  Location: [facility];  Service: Neurosurgery;  Laterality: N/A;   LAPAROSCOPIC ENDOMETRIOSIS FULGURATION     ablation   LUMBAR LAMINECTOMY/DECOMPRESSION MICRODISCECTOMY N/A 07/31/2021   Procedure: Open Transpedicular biopsy of Thoracic twelve;  Surgeon: Kristeen Miss, MD;  Location: [facility];  Service: Neurosurgery;  Laterality: N/A;   OOPHORECTOMY  2010   w/hyst--Dr. Quincy Simmonds   TUBAL LIGATION  2004   w/ C-section Dr. Quincy Simmonds   TYMPANOPLASTY     right ear   VAGINAL HYSTERECTOMY  05/21/2009   lap assisted /partial    Family History  Problem Relation Age of Onset   Kidney disease Mother        renal failure   Stroke Mother    Diabetes Mother    Hypertension Mother    Heart failure Mother    Colon cancer Mother    Stroke Father    Cancer - Colon Maternal Aunt    Hypertension Sister    Rheum arthritis Sister    Hypertension Brother    Hypertension Sister    Hypertension Sister    Hypertension Sister    Breast cancer Sister    Kidney cancer Sister    Esophageal cancer Neg Hx    Rectal cancer Neg Hx    Stomach cancer Neg Hx     Allergies  Allergen Reactions   Buspar [Buspirone Hcl]     Worsened her panic attacks    Current Outpatient Medications on File Prior to Visit  Medication Sig Dispense Refill   acetaminophen (TYLENOL) 500 MG tablet Take 1,000 mg by mouth every 6 (six) hours as needed for moderate pain or headache.     b complex vitamins capsule Take 1 capsule by mouth daily.     betamethasone valerate (VALISONE) 0.1 % cream Apply topically as needed.     cetirizine (ZYRTEC) 10 MG tablet Take 10 mg by mouth daily.      Cholecalciferol (VITAMIN D3) 125 MCG (5000 UT) CAPS Take 5,000 Units by mouth daily.     hydrOXYzine (ATARAX) 10 MG tablet Take 1 or 2 tablets by mouth as needed for anxiety attack. 15 tablet 0   lidocaine-prilocaine (EMLA) cream Apply to affected area once 30 g 3    losartan-hydrochlorothiazide (HYZAAR) 50-12.5 MG tablet Take 1 tablet by mouth daily. for blood pressure. 90 tablet 3   Methylcobalamin (B-12) 5000 MCG TBDP Take 5,000 mcg  by mouth daily.     venlafaxine XR (EFFEXOR-XR) 37.5 MG 24 hr capsule TAKE 1 CAPSULE BY MOUTH EVERY DAY WITH BREAKFAST FOR HOT FLASHES AND ANXIETY 90 capsule 2   No current facility-administered medications on file prior to visit.    BP 138/84   Pulse 95   Temp (!) 96.6 F (35.9 C) (Temporal)   Ht '5\' 4"'$  (1.626 m)   Wt 250 lb (113.4 kg)   SpO2 95%   BMI 42.91 kg/m  Objective:   Physical Exam Cardiovascular:     Rate and Rhythm: Normal rate and regular rhythm.  Pulmonary:     Effort: Pulmonary effort is normal.     Breath sounds: Normal breath sounds.  Musculoskeletal:     Cervical back: Neck supple.  Skin:    General: Skin is warm and dry.           Assessment & Plan:   Problem List Items Addressed This Visit       Endocrine   Type 2 diabetes mellitus with hyperglycemia ([place]) - Primary    Repeat A1C pending today.   Encouraged her to work on her diet, limit sweets. Continue regular activity.  We did discuss initiation of metformin XR 500 mg daily if glucose readings are still above goal, she agrees if warranted. Discussed potential side effects.   Urine microalbumin due and pending. Foot exam UTD.  Follow up in 3 months.       Relevant Orders   Microalbumin/Creatinine Ratio, Urine   Hemoglobin A1c       Pleas Koch, NP

## 2022-09-09 ENCOUNTER — Ambulatory Visit (HOSPITAL_COMMUNITY)
Admission: RE | Admit: 2022-09-09 | Discharge: 2022-09-09 | Disposition: A | Discharge status: Home / Self Care | Payer: 59 | Source: Ambulatory Visit | Attending: Hematology | Admitting: Hematology

## 2022-09-09 DIAGNOSIS — C8330 Diffuse large B-cell lymphoma, unspecified site: Secondary | ICD-10-CM | POA: Insufficient documentation

## 2022-09-09 LAB — GLUCOSE, CAPILLARY: Glucose-Capillary: 113 mg/dL — ABNORMAL HIGH (ref 70–99)

## 2022-09-09 MED ORDER — FLUDEOXYGLUCOSE F - 18 (FDG) INJECTION
12.4900 | Freq: Once | INTRAVENOUS | Status: AC
  Administered 2022-09-09: 12.49 via INTRAVENOUS

## 2022-09-21 ENCOUNTER — Other Ambulatory Visit: Payer: Self-pay

## 2022-09-21 DIAGNOSIS — C8330 Diffuse large B-cell lymphoma, unspecified site: Secondary | ICD-10-CM

## 2022-09-21 MED ORDER — AMOXICILLIN 500 MG PO CAPS: 2000.0000 mg | ORAL_CAPSULE | Freq: Once | ORAL | 1 refills | Status: AC

## 2022-10-02 ENCOUNTER — Encounter: Payer: Self-pay | Admitting: Nurse Practitioner

## 2022-10-02 ENCOUNTER — Ambulatory Visit (INDEPENDENT_AMBULATORY_CARE_PROVIDER_SITE_OTHER): Payer: 59 | Admitting: Nurse Practitioner

## 2022-10-02 VITALS — BP 138/78 | HR 97 | Temp 97.9°F | Resp 16 | Ht 64.0 in | Wt 244.5 lb

## 2022-10-02 DIAGNOSIS — U071 COVID-19: Secondary | ICD-10-CM | POA: Insufficient documentation

## 2022-10-02 MED ORDER — MOLNUPIRAVIR EUA 200MG CAPSULE: 4.0000 | ORAL_CAPSULE | Freq: Two times a day (BID) | ORAL | 0 refills | Status: AC

## 2022-10-02 NOTE — Progress Notes (Signed)
   Acute Office Visit  Subjective:     Patient ID: Virginia Oneal, female    DOB: 06-Jun-1966, 56 y.o.   MRN: [id number]  Chief Complaint  Patient presents with   Covid Positive    On 12/20    Cough    X 3 days   Fever     Patient is in today for COVID-19  Symptoms started on 09/30/2022 Covid test on 09/30/2022 that was positive.  Husband was sick on Sunday Moderna x2 and 2 boosters Has been taking robitussin and tylenol. Did help   Review of Systems  Constitutional:  Positive for chills, fever and malaise/fatigue.       Appetite decreased Fluid intake is good   HENT:  Positive for congestion, sinus pain and sore throat. Negative for ear discharge and ear pain.   Respiratory:  Positive for cough and sputum production. Negative for shortness of breath.   Musculoskeletal:  Positive for joint pain and myalgias.  Neurological:  Negative for headaches (improved).        Objective:    BP 138/78   Pulse 97   Temp 97.9 F (36.6 C)   Resp 16   Ht '5\' 4"'$  (1.626 m)   Wt 244 lb 8 oz (110.9 kg)   SpO2 98%   BMI 41.97 kg/m    Physical Exam Vitals and nursing note reviewed.  Constitutional:      Appearance: Normal appearance. She is obese.  HENT:     Right Ear: Ear canal and external ear normal.     Left Ear: Tympanic membrane, ear canal and external ear normal.     Ears:     Comments: Right TM scarring    Mouth/Throat:     Mouth: Mucous membranes are moist.     Pharynx: Oropharynx is clear.  Cardiovascular:     Rate and Rhythm: Normal rate and regular rhythm.     Heart sounds: Normal heart sounds.  Pulmonary:     Effort: Pulmonary effort is normal.     Breath sounds: Normal breath sounds.  Lymphadenopathy:     Cervical: No cervical adenopathy.  Neurological:     Mental Status: She is alert.     No results found for any visits on 10/02/22.      Assessment & Plan:   Problem List Items Addressed This Visit       Other   COVID-19 - Primary     Positive for COVID-19 at home.  Did discuss antiviral treatments today are EUA only.  After joint discussion we will pursue molnupiravir.  Patient does have several comorbidities including including large B-cell lymphoma, obesity, diabetes controlled with diet.  Discussed CDC guidelines/recommendations in regards to self-isolation/quarantine.  Discussed signs and symptoms when to be seen over the weekend.  Start molnupiravir soon as possible follow-up if no improvement      Relevant Medications   molnupiravir EUA (LAGEVRIO) 200 mg CAPS capsule    Meds ordered this encounter  Medications   molnupiravir EUA (LAGEVRIO) 200 mg CAPS capsule    Sig: Take 4 capsules (800 mg total) by mouth 2 (two) times daily for 5 days.    Dispense:  40 capsule    Refill:  0    Order Specific Question:   Supervising Provider    Answer:   TOWER, MARNE A [[id number]    Return if symptoms worsen or fail to improve.  Romilda Garret, NP

## 2022-10-02 NOTE — Patient Instructions (Signed)
Nice to see you today I have sent in the antiviral medication to the pharmacy Your need to quarantine through 10/04/2022 If you have been fever free for 24 hours with out the use of fever reducing medications then on 10/05/2022 you can go out but need to wear a mask around others for 5 days Follow up if you do not improve

## 2022-10-02 NOTE — Assessment & Plan Note (Signed)
Positive for COVID-19 at home.  Did discuss antiviral treatments today are EUA only.  After joint discussion we will pursue molnupiravir.  Patient does have several comorbidities including including large B-cell lymphoma, obesity, diabetes controlled with diet.  Discussed CDC guidelines/recommendations in regards to self-isolation/quarantine.  Discussed signs and symptoms when to be seen over the weekend.  Start molnupiravir soon as possible follow-up if no improvement

## 2022-10-15 ENCOUNTER — Other Ambulatory Visit: Payer: Self-pay

## 2022-10-15 DIAGNOSIS — C8330 Diffuse large B-cell lymphoma, unspecified site: Secondary | ICD-10-CM

## 2022-10-16 ENCOUNTER — Inpatient Hospital Stay (HOSPITAL_BASED_OUTPATIENT_CLINIC_OR_DEPARTMENT_OTHER): Payer: 59 | Admitting: Hematology

## 2022-10-16 ENCOUNTER — Inpatient Hospital Stay: Payer: 59 | Attending: Hematology

## 2022-10-16 VITALS — BP 151/83 | HR 89 | Temp 97.7°F | Resp 17 | Wt 248.3 lb

## 2022-10-16 DIAGNOSIS — Z8616 Personal history of COVID-19: Secondary | ICD-10-CM | POA: Diagnosis not present

## 2022-10-16 DIAGNOSIS — K760 Fatty (change of) liver, not elsewhere classified: Secondary | ICD-10-CM | POA: Diagnosis not present

## 2022-10-16 DIAGNOSIS — Z86010 Personal history of colonic polyps: Secondary | ICD-10-CM | POA: Diagnosis not present

## 2022-10-16 DIAGNOSIS — C8330 Diffuse large B-cell lymphoma, unspecified site: Secondary | ICD-10-CM

## 2022-10-16 DIAGNOSIS — I10 Essential (primary) hypertension: Secondary | ICD-10-CM | POA: Insufficient documentation

## 2022-10-16 DIAGNOSIS — Z95828 Presence of other vascular implants and grafts: Secondary | ICD-10-CM

## 2022-10-16 DIAGNOSIS — I2510 Atherosclerotic heart disease of native coronary artery without angina pectoris: Secondary | ICD-10-CM | POA: Diagnosis not present

## 2022-10-16 DIAGNOSIS — Z87442 Personal history of urinary calculi: Secondary | ICD-10-CM | POA: Diagnosis not present

## 2022-10-16 DIAGNOSIS — C8512 Unspecified B-cell lymphoma, intrathoracic lymph nodes: Secondary | ICD-10-CM | POA: Diagnosis present

## 2022-10-16 LAB — CBC WITH DIFFERENTIAL (CANCER CENTER ONLY)
Abs Immature Granulocytes: 0.01 10*3/uL (ref 0.00–0.07)
Basophils Absolute: 0 10*3/uL (ref 0.0–0.1)
Basophils Relative: 1 %
Eosinophils Absolute: 0.2 10*3/uL (ref 0.0–0.5)
Eosinophils Relative: 4 %
HCT: 39.9 % (ref 36.0–46.0)
Hemoglobin: 14 g/dL (ref 12.0–15.0)
Immature Granulocytes: 0 %
Lymphocytes Relative: 18 %
Lymphs Abs: 0.8 10*3/uL (ref 0.7–4.0)
MCH: 31.7 pg (ref 26.0–34.0)
MCHC: 35.1 g/dL (ref 30.0–36.0)
MCV: 90.3 fL (ref 80.0–100.0)
Monocytes Absolute: 0.4 10*3/uL (ref 0.1–1.0)
Monocytes Relative: 9 %
Neutro Abs: 3 10*3/uL (ref 1.7–7.7)
Neutrophils Relative %: 68 %
Platelet Count: 147 10*3/uL — ABNORMAL LOW (ref 150–400)
RBC: 4.42 MIL/uL (ref 3.87–5.11)
RDW: 12.4 % (ref 11.5–15.5)
WBC Count: 4.3 10*3/uL (ref 4.0–10.5)
nRBC: 0 % (ref 0.0–0.2)

## 2022-10-16 LAB — CMP (CANCER CENTER ONLY)
ALT: 25 U/L (ref 0–44)
AST: 20 U/L (ref 15–41)
Albumin: 4.1 g/dL (ref 3.5–5.0)
Alkaline Phosphatase: 94 U/L (ref 38–126)
Anion gap: 8 (ref 5–15)
BUN: 13 mg/dL (ref 6–20)
CO2: 27 mmol/L (ref 22–32)
Calcium: 9.6 mg/dL (ref 8.9–10.3)
Chloride: 105 mmol/L (ref 98–111)
Creatinine: 0.72 mg/dL (ref 0.44–1.00)
GFR, Estimated: >60 mL/min (ref >60)
Glucose, Bld: 129 mg/dL — ABNORMAL HIGH (ref 70–99)
Potassium: 4.3 mmol/L (ref 3.5–5.1)
Sodium: 140 mmol/L (ref 135–145)
Total Bilirubin: 0.5 mg/dL (ref 0.3–1.2)
Total Protein: 6.1 g/dL — ABNORMAL LOW (ref 6.5–8.1)

## 2022-10-16 LAB — LACTATE DEHYDROGENASE: LDH: 162 U/L (ref 98–192)

## 2022-10-16 IMAGING — CT NM PET TUM IMG RESTAG (PS) SKULL BASE T - THIGH
7 series · 25 of 25 positions shown · non-contrast
Comparison: Multiple priors including most recent [facility]

CLINICAL DATA: Subsequent treatment strategy for diffuse large
B-cell lymphoma.

EXAM:
NUCLEAR MEDICINE PET SKULL BASE TO THIGH
TECHNIQUE: 12.75 mCi F-18 FDG was injected intravenously. Full-ring PET imaging
was performed from the skull base to thigh after the radiotracer. CT
data was obtained and used for attenuation correction and anatomic
localization.
Fasting blood glucose: 117 mg/dl

[Series 3: pet sk_thigh ac · axial · 5.0mm · 4.07mm/px · z∈[-750,+178]mm · 6 of 233 slices shown]
[im 1/233]
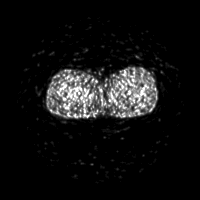
[im 47/233]
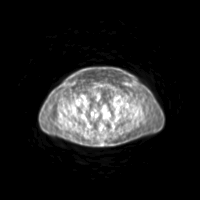
[im 93/233]
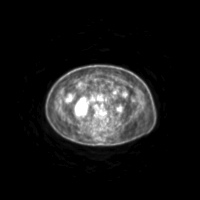
[im 140/233]
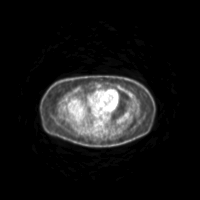
[im 186/233]
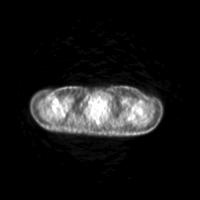
[im 233/233]
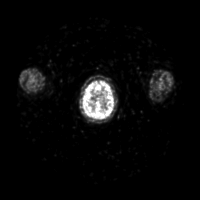

[Series 4: ct sk_thigh 5.0 br38 · axial · 5.0mm · 0.98mm/px · z∈[-750,+178]mm · 5 of 233 slices shown]
[im 1/233  brain]
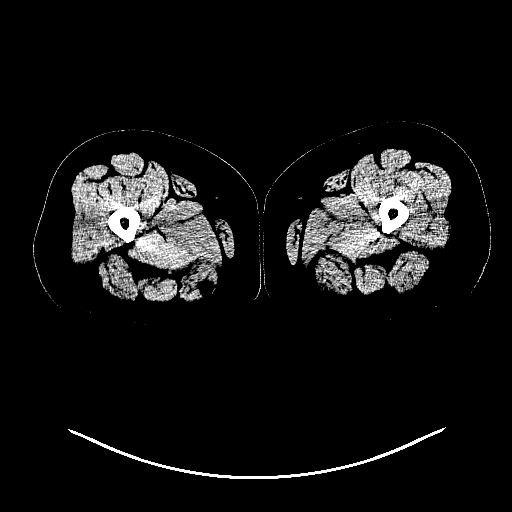
[im 59/233  brain]
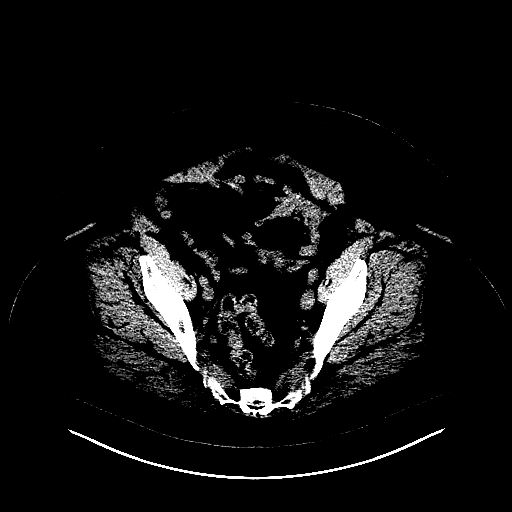
[im 117/233  brain]
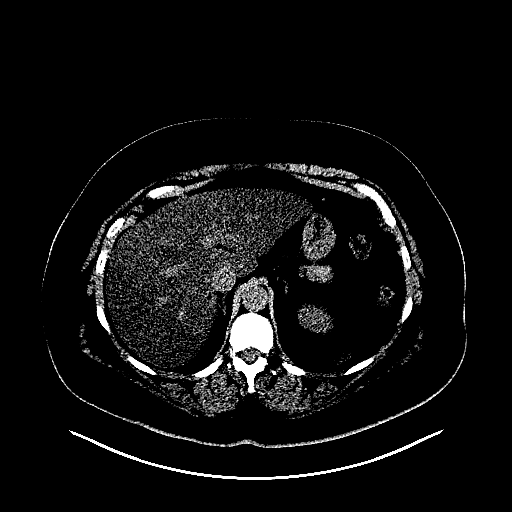
[im 175/233]
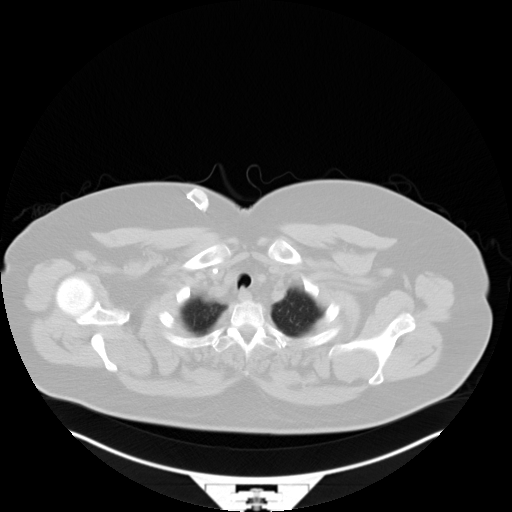
[im 233/233  brain]
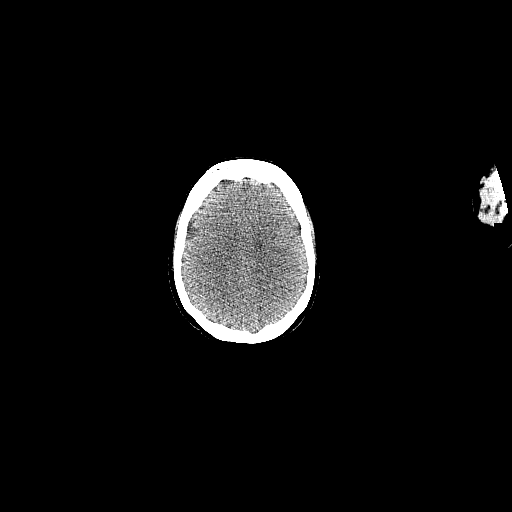

[Series 5: pet sk_thigh nac · axial · 5.0mm · 4.07mm/px · z∈[-750,+178]mm · 5 of 233 slices shown]
[im 1/233]
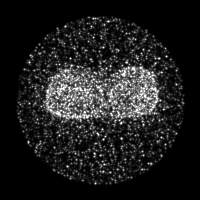
[im 59/233]
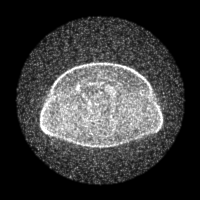
[im 117/233]
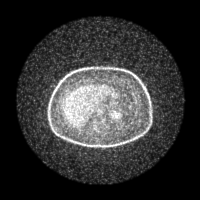
[im 175/233]
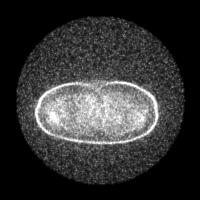
[im 233/233]
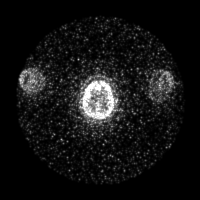

[Series 7: ct 5.0 bl57 lung_bone · axial · 5.0mm · 0.82mm/px · z∈[-342,-26]mm · 2 of 80 slices shown]
[im 1/80]
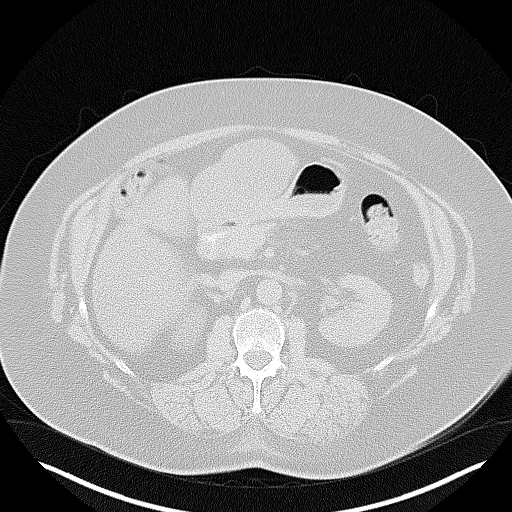
[im 80/80]
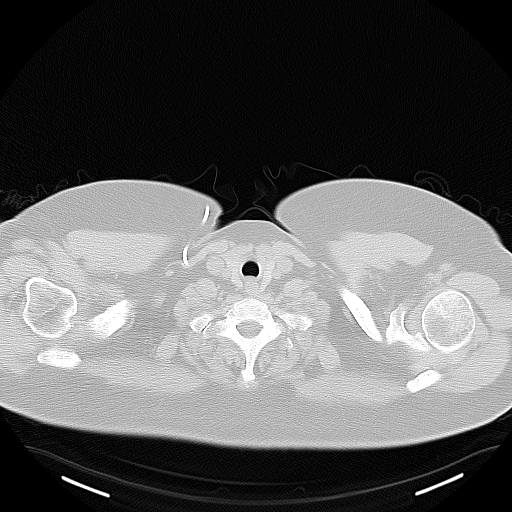

[Series 603: fused tra · 5 of 232 slices shown]
[im 1/232]
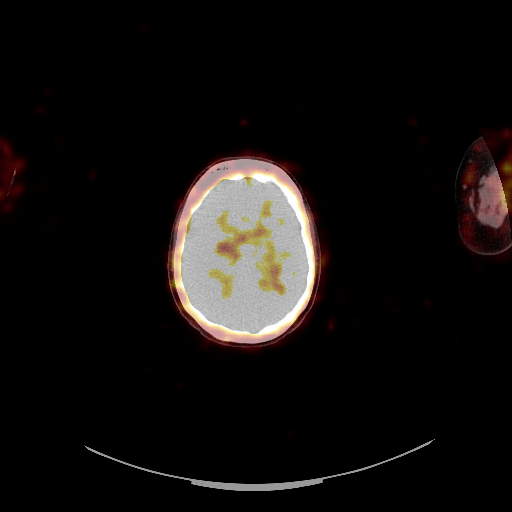
[im 58/232]
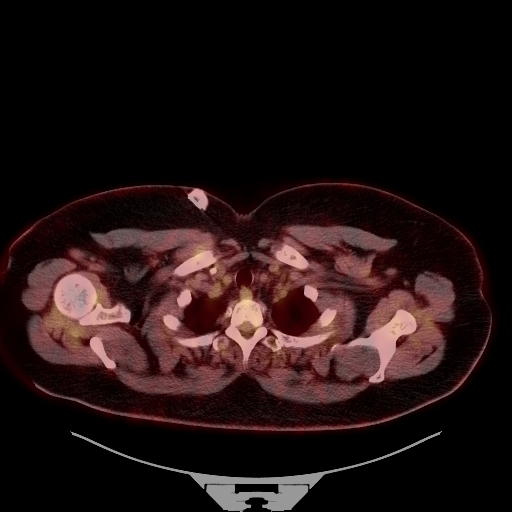
[im 116/232]
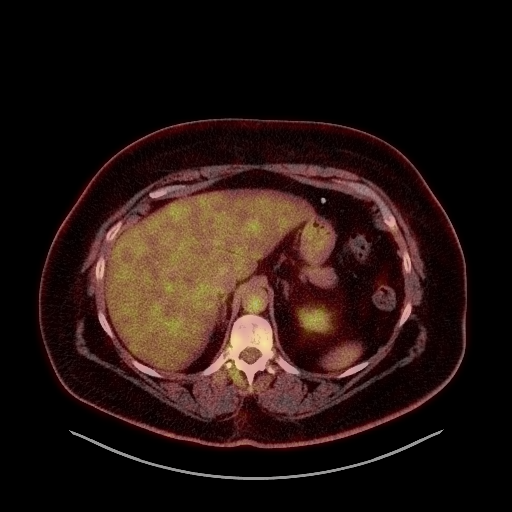
[im 174/232]
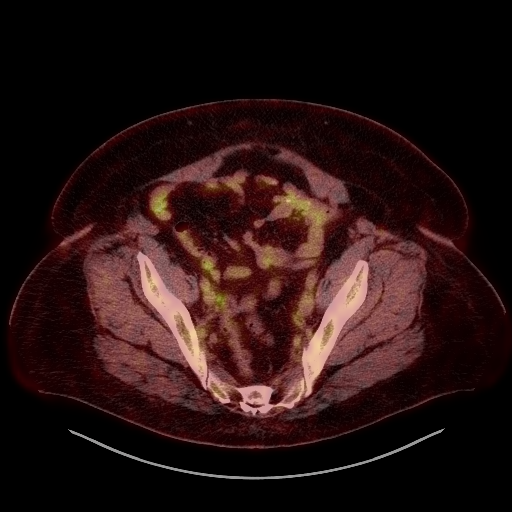
[im 232/232]
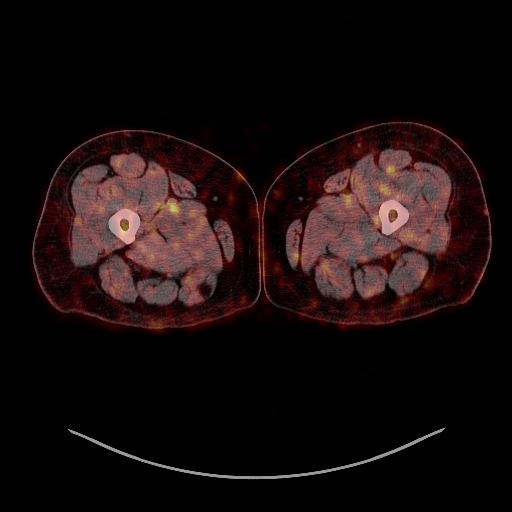

[Series 604: fused cor · 1 of 66 slices shown]
[im 1/66]
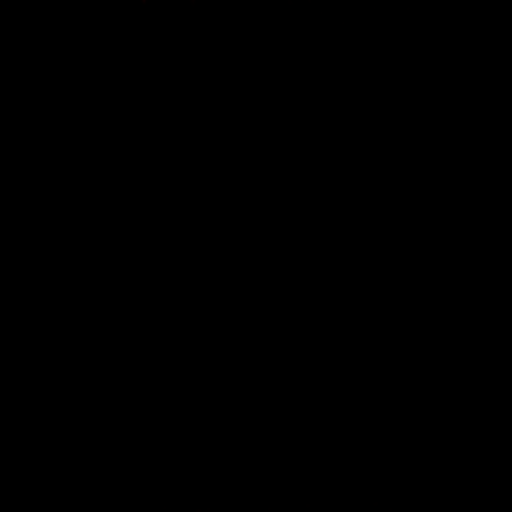

[Series 605: mip pet · coronal · 1.93mm/px · 1 of 32 slices shown]
[im 1/32]
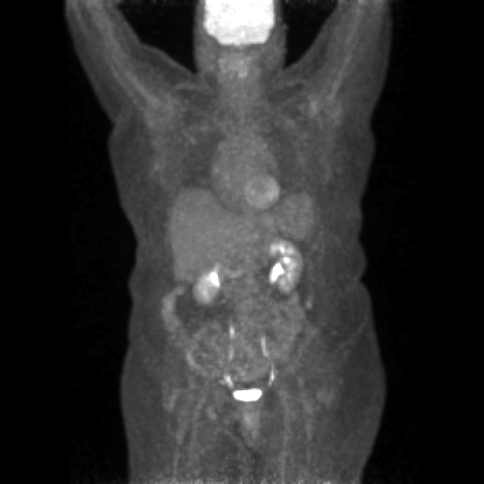

[25 of 25 positions shown; findings below may reference images not displayed]

FINDINGS: Mediastinal blood pool activity: SUV max

Liver activity: SUV max

NECK: No hypermetabolic cervical adenopathy.

Incidental CT findings: No pathologically enlarged cervical lymph
nodes. Streak artifact from dental hardware.

CHEST: No hypermetabolic thoracic adenopathy or suspicious pulmonary
nodules.

Incidental CT findings: Right chest wall Port-A-Cath with tip at the
superior cavoatrial junction. Mitral annular calcifications. Normal
size heart. Aortic atherosclerosis. No suspicious pulmonary nodules
or masses.

Soft tissue density along the left paraspinous region again does not
demonstrate abnormal hypermetabolic activity is similar in size over
multiple prior studies measuring 14 x 10 mm on image 76/4.

ABDOMEN/PELVIS: No abnormal hypermetabolic activity within the
liver, pancreas, adrenal glands, or spleen.

No pathologically enlarged hypermetabolic abdominal or pelvic lymph
nodes. Previously indexed periportal lymph node does not demonstrate
significant abnormal FDG avidity and measures 6 mm on image 126/4
unchanged in size.

Incidental CT findings: Hepatic steatosis. Hepatic cysts.
Gallbladder surgically absent. No hydronephrosis. Nonobstructive
right upper pole renal stones. No bowel obstruction.

SKELETON: Similar hypermetabolic activity associated with the tracks
in the bilateral T12 pedicles crossing into the vertebral body with
similar mild uptake in the right paraspinal musculature, likely
reflecting sequela prior biopsy with a max SUV of 3.6 previously
along the right pedicle. As well there is similar increased
metabolic activity in the T12 vertebral body slightly asymmetric to
the left with a max SUV of 4.01 previously 4.2.

Mild diffuse marrow uptake is decreased from prior and most likely
reflects marrow stimulation.

Incidental CT findings: none
IMPRESSION: 1. No hypermetabolic lymphadenopathy above or below the diaphragm
and no splenomegaly.

2. Similar FDG avidity in the T12 vertebral body and pedicles, with
the persistent metabolic activity possibly reflecting change related
to the prior biopsy of the T12 vertebral body lesion. Attention on
follow-up imaging is suggested.

3. Interval decrease in the now mild diffuse marrow uptake, most
likely reflecting marrow stimulation.

4.  Hepatic steatosis.

5.  Nonobstructive right nephrolithiasis.

## 2022-10-16 NOTE — Progress Notes (Signed)
Virginia Oneal   HEMATOLOGY/ONCOLOGY CLINIC NOTE  Date of Service: 10/16/22  Patient Care Team: Doreene Nest, NP as PCP - General (Internal Medicine) Barnett Abu, MD as Consulting Physician (Neurosurgery) Emelia Loron, MD as Consulting Physician (General Surgery)  CHIEF COMPLAINTS/PURPOSE OF CONSULTATION:  Follow-up for continued evaluation and management of large B-cell lymphoma and for PET CT review after 6 cycles of R-CHOP chemotherapy  HISTORY OF PRESENTING ILLNESS:  Please see previous note for details of initial presentation  INTERVAL HISTORY:  Virginia Oneal is a 57 y.o. female is here for follow-up of her diffuse large B-cell lymphoma.  Patient was last seen by me on 07/10/22 and was doing well overall with no medical concerns.  Today, she reports some general bone pain, mostly in her back. Some days are worse than others.  She had COVID-19 three weeks ago and received treatment. However, she continues to have a cough. She has not been very physically active recently due to COVID. Prior to her cough, she used to walk about 3-3.5 miles daily and watched her diet.  She has received her influenza and shingles vaccination but not the COVID-19 booster.  She denies any new symptoms since last visit. No fever, chills, night sweats, new lumps/bumps, wheezing, SOB. No specific back pain, change in bowel habits, leg swelling, or abdominal pain.   MEDICAL HISTORY:  Past Medical History:  Diagnosis Date   Abnormal uterine bleeding    Anemia    as a teenager   Anxiety    postpartum and hx panic attacks   Arrhythmia    Arthritis    back and hands   Chest mass    by CT scanning   Chest pain 07/26/2014   Complication of anesthesia    slow to wake up   COVID-19 10/2020   Depression    DOE (dyspnea on exertion) 07/26/2014   Family history of adverse reaction to anesthesia    mom slow to wake up   Gestational diabetes    resolved after pregnancy   History of kidney  stones    Hypertension    Personal history of colonic polyps    PONV (postoperative nausea and vomiting)    PREMATURE ATRIAL CONTRACTIONS 08/15/2008   Qualifier: Diagnosis of  By: Hetty Ely MD, Franne Grip    RENAL CALCULUS, HX OF 08/15/2008   Qualifier: Diagnosis of  By: Lorrene Reid LPN, Wanda     Rotator cuff tendinitis 06/29/2011   Seasonal allergies    Urinary incontinence 05/2009   hx bladder sling--Dr. Edward Jolly    SURGICAL HISTORY: Past Surgical History:  Procedure Laterality Date   ABDOMINAL HYSTERECTOMY  05/2009   TAH/LSO   BLADDER SUSPENSION  2010   Dr. Edward Jolly   BREAST BIOPSY     left stereo   CESAREAN SECTION  03/2003   CESAREAN SECTION  2004   CHOLECYSTECTOMY N/A 01/02/2021   Procedure: LAPAROSCOPIC CHOLECYSTECTOMY;  Surgeon: Emelia Loron, MD;  Location: [facility];  Service: General;  Laterality: N/A;  58  - GEN AND TAP BLOCK LDOW   COLONOSCOPY  09/08   COLPORRHAPHY  05/2009   Dr. Edward Jolly   ENDOMETRIAL ABLATION  2006   Dr. Edward Jolly   INCONTINENCE SURGERY     sling for urinary incontinence   IR IMAGING GUIDED PORT INSERTION  09/01/2021   KYPHOPLASTY N/A 04/07/2021   Procedure: Thoracic 12 Vertebral body biopsy;  Surgeon: Barnett Abu, MD;  Location: [facility] OR;  Service: Neurosurgery;  Laterality: N/A;   LAPAROSCOPIC ENDOMETRIOSIS FULGURATION  ablation   LUMBAR LAMINECTOMY/DECOMPRESSION MICRODISCECTOMY N/A 07/31/2021   Procedure: Open Transpedicular biopsy of Thoracic twelve;  Surgeon: Barnett Abu, MD;  Location: [facility] OR;  Service: Neurosurgery;  Laterality: N/A;   OOPHORECTOMY  2010   w/hyst--Dr. Edward Jolly   TUBAL LIGATION  2004   w/ C-section Dr. Edward Jolly   TYMPANOPLASTY     right ear   VAGINAL HYSTERECTOMY  05/21/2009   lap assisted /partial    SOCIAL HISTORY: Social History   Socioeconomic History   Marital status: Married    Spouse name: Not on file   Number of children: 2   Years of education: Not on file   Highest education level: Not on file  Occupational History    Occupation: [employer]  Tobacco Use   Smoking status: Former    Years: 10.00    Types: Cigarettes    Quit date: 01/06/1999    Years since quitting: 23.7   Smokeless tobacco: Never  Vaping Use   Vaping Use: Never used  Substance and Sexual Activity   Alcohol use: No    Alcohol/week: 0.0 standard drinks of alcohol   Drug use: No   Sexual activity: Yes    Partners: Male    Birth control/protection: Other-see comments, Surgical    Comment: TAH/LSO  Other Topics Concern   Not on file  Social History Narrative   ** Merged History Encounter **       Social Determinants of Health   Financial Resource Strain: Not on file  Food Insecurity: Not on file  Transportation Needs: Not on file  Physical Activity: Not on file  Stress: Not on file  Social Connections: Not on file  Intimate Partner Violence: Not on file    FAMILY HISTORY: Family History  Problem Relation Age of Onset   Kidney disease Mother        renal failure   Stroke Mother    Diabetes Mother    Hypertension Mother    Heart failure Mother    Colon cancer Mother    Stroke Father    Cancer - Colon Maternal Aunt    Hypertension Sister    Rheum arthritis Sister    Hypertension Brother    Hypertension Sister    Hypertension Sister    Hypertension Sister    Breast cancer Sister    Kidney cancer Sister    Esophageal cancer Neg Hx    Rectal cancer Neg Hx    Stomach cancer Neg Hx     ALLERGIES:  is allergic to buspar [buspirone hcl].  MEDICATIONS:  Current Outpatient Medications  Medication Sig Dispense Refill   acetaminophen (TYLENOL) 500 MG tablet Take 1,000 mg by mouth every 6 (six) hours as needed for moderate pain or headache.     b complex vitamins capsule Take 1 capsule by mouth daily.     betamethasone valerate (VALISONE) 0.1 % cream Apply topically as needed.     cetirizine (ZYRTEC) 10 MG tablet Take 10 mg by mouth daily.      Cholecalciferol (VITAMIN D3) 125 MCG (5000 UT) CAPS Take 5,000  Units by mouth daily.     hydrOXYzine (ATARAX) 10 MG tablet Take 1 or 2 tablets by mouth as needed for anxiety attack. 15 tablet 0   lidocaine-prilocaine (EMLA) cream Apply to affected area once 30 g 3   losartan-hydrochlorothiazide (HYZAAR) 50-12.5 MG tablet Take 1 tablet by mouth daily. for blood pressure. 90 tablet 3   Methylcobalamin (B-12) 5000 MCG TBDP Take 5,000 mcg by  mouth daily.     venlafaxine XR (EFFEXOR-XR) 37.5 MG 24 hr capsule TAKE 1 CAPSULE BY MOUTH EVERY DAY WITH BREAKFAST FOR HOT FLASHES AND ANXIETY 90 capsule 2   No current facility-administered medications for this visit.    REVIEW OF SYSTEMS:   10 Point review of Systems was done is negative except as noted above.  PHYSICAL EXAMINATION: .BP (!) 151/83 (BP Location: Left Arm, Patient Position: Sitting) Comment: nurse notified  Pulse 89   Temp 97.7 F (36.5 C) (Temporal)   Resp 17   Wt 248 lb 4.8 oz (112.6 kg)   SpO2 98%   BMI 42.62 kg/m  NAD GENERAL:alert, in no acute distress and comfortable SKIN: no acute rashes, no significant lesions EYES: conjunctiva are pink and non-injected, sclera anicteric OROPHARYNX: MMM, no exudates, no oropharyngeal erythema or ulceration NECK: supple, no JVD LYMPH:  no palpable lymphadenopathy in the cervical, axillary or inguinal regions LUNGS: clear to auscultation b/l with normal respiratory effort HEART: regular rate & rhythm ABDOMEN:  normoactive bowel sounds , non tender, not distended. Extremity: no pedal edema PSYCH: alert & oriented x 3 with fluent speech NEURO: no focal motor/sensory deficits  LABORATORY DATA:  I have reviewed the data as listed  .    Latest Ref Rng & Units 10/16/2022    9:57 AM 07/10/2022   12:30 PM 04/10/2022    8:20 AM  CBC  WBC 4.0 - 10.5 K/uL 4.3  4.8  3.5   Hemoglobin 12.0 - 15.0 g/dL 29.5  62.1  30.8   Hematocrit 36.0 - 46.0 % 39.9  36.3  37.1   Platelets 150 - 400 K/uL 147  148  131    .    Latest Ref Rng & Units 10/16/2022    9:57 AM  07/10/2022   12:30 PM 06/08/2022   10:50 AM  CMP  Glucose 70 - 99 mg/dL 657  846  962   BUN 6 - 20 mg/dL 13  17  22    Creatinine 0.44 - 1.00 mg/dL 9.52  8.41  3.24   Sodium 135 - 145 mmol/L 140  138  138   Potassium 3.5 - 5.1 mmol/L 4.3  3.7  4.0   Chloride 98 - 111 mmol/L 105  104  104   CO2 22 - 32 mmol/L 27  30  29    Calcium 8.9 - 10.3 mg/dL 9.6  9.5  9.4   Total Protein 6.5 - 8.1 g/dL 6.1  6.5    Total Bilirubin 0.3 - 1.2 mg/dL 0.5  0.5    Alkaline Phos 38 - 126 U/L 94  107    AST 15 - 41 U/L 20  21    ALT 0 - 44 U/L 25  26     Lab Results  Component Value Date   LDH 162 10/16/2022    HIV Screen 4th Generation wRfx     Non Reactive Non Reactive  Hepatitis B Surface Ag     NON REACTIVE NON REACTIVE  Hep B Core Total Ab     NON REACTIVE NON REACTIVE  HCV Ab     NON REACTIVE NON REACTIVE  LDH     98 - 192 U/L 228 (H)    SURGICAL PATHOLOGY  CASE: [id number]  PATIENT: Elexius Waldren  Surgical Pathology Report   Clinical History: lesion of thoracic vertebra (cm)   FINAL MICROSCOPIC DIAGNOSIS:   A-C. BONE, T12 VERTEBRAL BODY AND MARROW, BIOPSY:  -  Non-Hodgkin B-cell lymphoma, high grade  -  See comment   COMMENT:   A-C.  The biopsies show normal hematopoeisis admixed with crush artifact  with associated marrow fibrosis and an atypical cellular infiltrate  composed of medium to large cells with irregular nuclear contours, ample  cytoplasm, variably prominent nucleoli, increased mitoses and apoptosis.  An initial panel of immunohistochemistry (cytokeratin AE1/3, cytokeratin  7, cytokeratin 20, CD20 and CD3) confirmed the morphologic impression  that the atypical cells were lymphoid in nature.  The atypical  lymphocytes are positive for Pax-5, CD20, CD10, bcl-6 but negative for  CD30, bcl-2, mum-1 and EBV by in-situ hybridization. CD3 and CD5  highlight background T-cells.  The proliferative rate by ki-67 is  increased (50-60%).  Overall, the findings are  consistent with a  non-Hodgkin B-cell lymphoma, high grade with a germinal center  phenotype.   FISH studies are pending and will be reported in an  addendum.  These findings were discussed with Dr. Danielle Dess on August 06, 2021.    RADIOGRAPHIC STUDIES: I have personally reviewed the radiological images as listed and agreed with the findings in the report.  ECHO 09/08/2021 1.Left ventricular ejection fraction, by estimation, is 55 to 60%. The left ventricle has normal function. The left ventricle has no regional wall motion abnormalities. There ismild left ventricular hypertrophy. Left ventricular diastolic parameters were normal.The average left ventricular global longitudinal strain is -20.4 %. The global longitudinal strain is normal. 2. Right ventricular systolic function is normal. The right ventricular size is normal. 3. Left atrial size was mildly dilated. The mitral valve is normal in structure. Trivial mitral valve regurgitation. No evidence of mitral stenosis. Moderate mitral annular calcification. 4.The aortic valve is normal in structure. Aortic valve regurgitation is not visualized. No aortic stenosis is present. 5.The inferior vena cava is normal in size with greater than 50% respiratory variability, suggesting right atrial pressure of 3 mmHg.  PET/CT scan 09/09/2022  IMPRESSION: 1. No specific findings of active malignancy. Previous low-grade activity in the T12 vertebral body has resolved. 2. Focal accentuated activity in the distal esophagus with maximum SUV 4.4, likely physiologic, no CT correlate or significant accentuated activity on the prior exam in this region. 3. Hepatic steatosis. 4. Nonobstructive bilateral nephrolithiasis. 5. Aortic and coronary atherosclerosis.  Mitral valve calcification.   ASSESSMENT & PLAN:   57 year old female with  1) Stage IVA high-grade large B cell non-Hodgkin's lymphoma.  Germinal center phenotype.  C-Myc not rearranged.   Ki-67 50 to 60% No overt constitutional symptoms PET/CT with involvement of thoracic spine and acromion process on the right.  Abdominal lymphadenopathy.  Hepatitis testing unremarkable HIV nonreactive Myeloma panel shows no M spike protein spike.  Echocardiogram shows normal ejection fraction  PET CT scan done on 09/02/2021 shows Scattered mild hypermetabolism within subcentimeter periportal, retroperitoneal and mesenteric lymph nodes, compatible with the given history of large B-cell lymphoma. 2. Hypermetabolism within known T7 and T12 lesions, better seen on MR thoracic spine 07/04/2021. 3. Focal hypermetabolism in the acromion of the right scapula, without a definite CT correlate. Recommend attention on follow-up.  2) Chemotherapy related neutropenia -now resolved.  No issues with infections.    3)h/o  G-CSF related significant bone pain.  PLAN  -Discussed lab results from 10/16/22 with patient. Blood counts stable. CBC  showed WBC of 4.3 K, hemoglobin of 14 K, and platelets of 147 K. CMP pending. LDH pending. -Pet scan 09/09/2022 revealed no evidence of progressive dural/progressive lymphoma.  Previous low-grade activity in the T12 vertebral body has resolved. -  Resolved activity in T12 vertebrae. No recurrent  lymphoma anywhere else. Incidental  nonspecific findings found not related to lymphoma. Recommended a healthy diet. Monitoring kidney stones. Some cholesterol deposit in arteries, not concerning. -recommended to use humidifier for cough -recommended to receive COVID-19 vaccination vaccination once she recovers from COVID-19, 3 months after her COVID infection.  FOLLOW-UP: IR for Port-A-Cath removal in 1 to 2 weeks Return to clinic with Dr. Candise Che with labs in 3 months  The total time spent in the appointment was 25 minutes* .  All of the patient's questions were answered with apparent satisfaction. The patient knows to call the clinic with any problems, questions or  concerns.   Wyvonnia Lora MD MS AAHIVMS [facility] Hematology/Oncology Physician [facility]  .*Total Encounter Time as defined by the Centers for Medicare and Medicaid Services includes, in addition to the face-to-face time of a patient visit (documented in the note above) non-face-to-face time: obtaining and reviewing outside history, ordering and reviewing medications, tests or procedures, care coordination (communications with other health care professionals or caregivers) and documentation in the medical record.   I,Mitra Faeizi,acting as a Neurosurgeon for Wyvonnia Lora, MD.,have documented all relevant documentation on the behalf of Wyvonnia Lora, MD,as directed by  Wyvonnia Lora, MD while in the presence of Wyvonnia Lora, MD.  .I have reviewed the above documentation for accuracy and completeness, and I agree with the above. Johney Maine MD

## 2022-10-22 ENCOUNTER — Encounter: Payer: Self-pay | Admitting: Hematology

## 2022-10-29 ENCOUNTER — Ambulatory Visit (HOSPITAL_COMMUNITY)
Admission: RE | Admit: 2022-10-29 | Discharge: 2022-10-29 | Disposition: A | Discharge status: Home / Self Care | Payer: 59 | Source: Ambulatory Visit | Attending: Hematology | Admitting: Hematology

## 2022-10-29 DIAGNOSIS — C8330 Diffuse large B-cell lymphoma, unspecified site: Secondary | ICD-10-CM

## 2022-10-29 DIAGNOSIS — Z95828 Presence of other vascular implants and grafts: Secondary | ICD-10-CM

## 2022-10-29 DIAGNOSIS — Z452 Encounter for adjustment and management of vascular access device: Secondary | ICD-10-CM | POA: Insufficient documentation

## 2022-10-29 HISTORY — PX: IR REMOVAL TUN ACCESS W/ PORT W/O FL MOD SED: IMG2290

## 2022-10-29 MED ORDER — LIDOCAINE-EPINEPHRINE 1 %-1:100000 IJ SOLN
INTRAMUSCULAR | Status: AC
  Administered 2022-10-29: 20 mL
  Filled 2022-10-29: qty 1

## 2022-10-29 NOTE — Procedures (Signed)
Interventional Radiology Procedure:   Indications: Completed treatment for diffuse large B-cell lymphoma   Procedure: Port removal   Findings: Complete removal of right chest port  Complications: No immediate complications noted.     EBL: Minimal  Plan: Discharge to home.    Estel Scholze R. Anselm Pancoast, MD  Pager: [phone]

## 2022-11-12 ENCOUNTER — Other Ambulatory Visit: Payer: Self-pay

## 2022-12-09 ENCOUNTER — Ambulatory Visit (INDEPENDENT_AMBULATORY_CARE_PROVIDER_SITE_OTHER)
Admission: RE | Admit: 2022-12-09 | Discharge: 2022-12-09 | Disposition: A | Discharge status: Home / Self Care | Payer: 59 | Source: Ambulatory Visit | Attending: Primary Care | Admitting: Primary Care

## 2022-12-09 ENCOUNTER — Encounter: Payer: Self-pay | Admitting: Primary Care

## 2022-12-09 ENCOUNTER — Ambulatory Visit (INDEPENDENT_AMBULATORY_CARE_PROVIDER_SITE_OTHER): Payer: 59 | Admitting: Primary Care

## 2022-12-09 VITALS — BP 132/84 | HR 88 | Temp 97.3°F | Ht 64.0 in | Wt 254.0 lb

## 2022-12-09 DIAGNOSIS — E1165 Type 2 diabetes mellitus with hyperglycemia: Secondary | ICD-10-CM | POA: Diagnosis not present

## 2022-12-09 DIAGNOSIS — R0609 Other forms of dyspnea: Secondary | ICD-10-CM

## 2022-12-09 LAB — LIPID PANEL
Cholesterol: 185 mg/dL (ref 0–200)
HDL: 60.9 mg/dL (ref >39.00)
LDL Cholesterol: 105 mg/dL — ABNORMAL HIGH (ref 0–99)
NonHDL: 123.87
Total CHOL/HDL Ratio: 3
Triglycerides: 93 mg/dL (ref 0.0–149.0)
VLDL: 18.6 mg/dL (ref 0.0–40.0)

## 2022-12-09 LAB — BRAIN NATRIURETIC PEPTIDE: Pro B Natriuretic peptide (BNP): 17 pg/mL (ref 0.0–100.0)

## 2022-12-09 LAB — POCT GLYCOSYLATED HEMOGLOBIN (HGB A1C): Hemoglobin A1C: 5.9 % — AB (ref 4.0–5.6)

## 2022-12-09 NOTE — Assessment & Plan Note (Addendum)
Improved and controlled with hemoglobin A1C of 5.9 today.   Continue to stay off meds.   Continue to monitor your diet and regular exercise without overexertion.   Follow up in 6 months.  I evaluated patient, was consulted regarding treatment, and agree with assessment and plan per Tinnie Gens, RN, DNP student.   Allie Bossier, NP-C

## 2022-12-09 NOTE — Progress Notes (Signed)
Subjective:    Patient ID: Virginia Oneal, female    DOB: 01/06/1966, 57 y.o.   MRN: [id number]  HPI  Virginia Oneal is a very pleasant 57 y.o. female with a history of type 2 diabetes, hypertension, diffuse large B cell lymphoma who presents today for follow up of diabetes and to discuss exertional dyspnea.   1) Type 2 Diabetes:   Current medications include: None.  She is checking her blood glucose _ times daily and is getting readings of  Last A1C: 6.0 in November 2023, 5.9 today Last Eye Exam: UTD Last Foot Exam: UTD Pneumonia Vaccination: Never completed, declines  Urine Microalbumin: UTD Statin: None  Dietary changes since last visit: Increased consumption of fruit and veggies, grilled food. Working to reduce rice and pasta. She has cut back on sweets.    Exercise: Walking 4-5 days weekly for about 1 hour.   BP Readings from Last 3 Encounters:  12/09/22 132/84  10/16/22 (!) 151/83  10/02/22 138/78   2) Exertional Dyspnea:   Symptom onset two months ago occurring with walking regimen. She began walking around Fall of 2023, walks 4-7 days weekly for about 1 hour at a time.   Symptoms occur once to twice weekly on average during her walking routine. During symptoms she does notice chest tightness, diaphoresis, and dizziness, has a hard time catching her breath. Feels like she may pass out at times.   She denies a history of asthma. She quit smoking in 2000, smoked about 7 years total. She does not vape or use e-cigarettes. She denies lower extremity. She did contract Covid-19 in January 2024 and did not walk at all.    Review of Systems  Constitutional:  Positive for fatigue.  Respiratory:  Positive for chest tightness.        Exertional dyspnea  Neurological:  Positive for dizziness.         Past Medical History:  Diagnosis Date   Abnormal uterine bleeding    Anemia    as a teenager   Anxiety    postpartum and hx panic attacks   Arrhythmia     Arthritis    back and hands   Chest mass    by CT scanning   Chest pain [id number]   Complication of anesthesia    slow to wake up   COVID-19 10/2020   Depression    DOE (dyspnea on exertion) 07/26/2014   Family history of adverse reaction to anesthesia    mom slow to wake up   Gestational diabetes    resolved after pregnancy   History of kidney stones    Hypertension    Personal history of colonic polyps    PONV (postoperative nausea and vomiting)    PREMATURE ATRIAL CONTRACTIONS 08/15/2008   Qualifier: Diagnosis of  By: Council Mechanic MD, Hilaria Ota    RENAL CALCULUS, HX OF 08/15/2008   Qualifier: Diagnosis of  By: Zara Council LPN, Wanda     Rotator cuff tendinitis 06/29/2011   Seasonal allergies    Urinary incontinence 05/2009   hx bladder sling--Dr. Quincy Simmonds    Social History   Socioeconomic History   Marital status: Married    Spouse name: Not on file   Number of children: 2   Years of education: Not on file   Highest education level: Not on file  Occupational History   Occupation: [employer]  Tobacco Use   Smoking status: Former    Years: 10.00    Types: Cigarettes  Quit date: 01/06/1999    Years since quitting: 23.9   Smokeless tobacco: Never  Vaping Use   Vaping Use: Never used  Substance and Sexual Activity   Alcohol use: No    Alcohol/week: 0.0 standard drinks of alcohol   Drug use: No   Sexual activity: Yes    Partners: Male    Birth control/protection: Other-see comments, Surgical    Comment: TAH/LSO  Other Topics Concern   Not on file  Social History Narrative   ** Merged History Encounter **       Social Determinants of Health   Financial Resource Strain: Not on file  Food Insecurity: Not on file  Transportation Needs: Not on file  Physical Activity: Not on file  Stress: Not on file  Social Connections: Not on file  Intimate Partner Violence: Not on file    Past Surgical History:  Procedure Laterality Date   ABDOMINAL HYSTERECTOMY  05/2009    TAH/LSO   BLADDER SUSPENSION  2010   Dr. Quincy Simmonds   BREAST BIOPSY     left stereo   CESAREAN SECTION  03/2003   CESAREAN SECTION  2004   CHOLECYSTECTOMY N/A 01/02/2021   Procedure: LAPAROSCOPIC CHOLECYSTECTOMY;  Surgeon: Rolm Bookbinder, MD;  Location: [facility];  Service: General;  Laterality: N/A;  58  - GEN AND TAP BLOCK LDOW   COLONOSCOPY  09/08   COLPORRHAPHY  05/2009   Dr. Quincy Simmonds   ENDOMETRIAL ABLATION  2006   Dr. Quincy Simmonds   INCONTINENCE SURGERY     sling for urinary incontinence   IR IMAGING GUIDED PORT INSERTION  09/01/2021   IR REMOVAL TUN ACCESS W/ PORT W/O FL MOD SED  10/29/2022   KYPHOPLASTY N/A 04/07/2021   Procedure: Thoracic 12 Vertebral body biopsy;  Surgeon: Kristeen Miss, MD;  Location: [facility];  Service: Neurosurgery;  Laterality: N/A;   LAPAROSCOPIC ENDOMETRIOSIS FULGURATION     ablation   LUMBAR LAMINECTOMY/DECOMPRESSION MICRODISCECTOMY N/A 07/31/2021   Procedure: Open Transpedicular biopsy of Thoracic twelve;  Surgeon: Kristeen Miss, MD;  Location: [facility];  Service: Neurosurgery;  Laterality: N/A;   OOPHORECTOMY  2010   w/hyst--Dr. Quincy Simmonds   TUBAL LIGATION  2004   w/ C-section Dr. Quincy Simmonds   TYMPANOPLASTY     right ear   VAGINAL HYSTERECTOMY  05/21/2009   lap assisted /partial    Family History  Problem Relation Age of Onset   Kidney disease Mother        renal failure   Stroke Mother    Diabetes Mother    Hypertension Mother    Heart failure Mother    Colon cancer Mother    Stroke Father    Cancer - Colon Maternal Aunt    Hypertension Sister    Rheum arthritis Sister    Hypertension Brother    Hypertension Sister    Hypertension Sister    Hypertension Sister    Breast cancer Sister    Kidney cancer Sister    Esophageal cancer Neg Hx    Rectal cancer Neg Hx    Stomach cancer Neg Hx     Allergies  Allergen Reactions   Buspar [Buspirone Hcl]     Worsened her panic attacks    Current Outpatient Medications on File Prior to Visit  Medication Sig  Dispense Refill   acetaminophen (TYLENOL) 500 MG tablet Take 1,000 mg by mouth every 6 (six) hours as needed for moderate pain or headache.     b complex vitamins capsule Take 1 capsule  by mouth daily.     betamethasone valerate (VALISONE) 0.1 % cream Apply topically as needed.     cetirizine (ZYRTEC) 10 MG tablet Take 10 mg by mouth daily.      Cholecalciferol (VITAMIN D3) 125 MCG (5000 UT) CAPS Take 5,000 Units by mouth daily.     hydrOXYzine (ATARAX) 10 MG tablet Take 1 or 2 tablets by mouth as needed for anxiety attack. 15 tablet 0   lidocaine-prilocaine (EMLA) cream Apply to affected area once 30 g 3   losartan-hydrochlorothiazide (HYZAAR) 50-12.5 MG tablet Take 1 tablet by mouth daily. for blood pressure. 90 tablet 3   Methylcobalamin (B-12) 5000 MCG TBDP Take 5,000 mcg by mouth daily.     venlafaxine XR (EFFEXOR-XR) 37.5 MG 24 hr capsule TAKE 1 CAPSULE BY MOUTH EVERY DAY WITH BREAKFAST FOR HOT FLASHES AND ANXIETY 90 capsule 2   No current facility-administered medications on file prior to visit.    BP 132/84   Pulse 88   Temp (!) 97.3 F (36.3 C) (Temporal)   Ht '5\' 4"'$  (1.626 m)   Wt 254 lb (115.2 kg)   SpO2 96%   BMI 43.60 kg/m  Objective:   Physical Exam Cardiovascular:     Rate and Rhythm: Normal rate and regular rhythm.  Pulmonary:     Effort: Pulmonary effort is normal.     Breath sounds: Normal breath sounds.  Musculoskeletal:     Cervical back: Neck supple.  Skin:    General: Skin is warm and dry.           Assessment & Plan:  Type 2 diabetes mellitus with hyperglycemia, without long-term current use of insulin (HCC) Assessment & Plan: Improved and controlled with hemoglobin A1C of 5.9 today.   Continue to stay off meds.   Continue to monitor your diet and regular exercise without overexertion.   Follow up in 6 months.  I evaluated patient, was consulted regarding treatment, and agree with assessment and plan per Tinnie Gens, RN, DNP student.    Allie Bossier, NP-C   Orders: -     POCT glycosylated hemoglobin (Hb A1C) -     Lipid panel  Exertional dyspnea Assessment & Plan: Unclear etiology.  Differentials include CAD, CHF, COPD.   ECG completed today with normal sinus rhythm with no ST elevation, PACs or PVCs.  No red flag symptoms. Appears similar compared to ECC from 2022.  Discussed with patient with history of lymphoma, diabetes and hypertension that she is at an increased risk for CAD.  Educated patient to avoid overexertion while walking.   Chest x-ray pending. Lipid panel and BNP pending.  Referral for cardiology placed for further work up.  Await results.  I evaluated patient, was consulted regarding treatment, and agree with assessment and plan per Tinnie Gens, RN, DNP student.   Allie Bossier, NP-C   Orders: -     EKG 12-Lead -     Brain natriuretic peptide -     Ambulatory referral to Cardiology -     DG Chest [place], NP

## 2022-12-09 NOTE — Progress Notes (Signed)
Established Patient Office Visit  Subjective   Patient ID: Virginia Oneal, female    DOB: [birth year]  Age: 57 y.o. MRN: [id number]  Chief Complaint  Patient presents with   Medical Management of Chronic Issues    Diabetes    HPI  Virginia Oneal is a 57 year old female with past medical history of hypertnesion, type 2 diabetes, anxiety, family history of colon cancer, diffuse large B cell lymphoma presents today for a diabetes follow up.   Current medications include: none  She is not checking her blood glucose at home. Last A1C: 6.0 in November, 2023 Last Eye Exam: not completed, due.  Last Foot Exam: completed. Pneumonia Vaccination: never completed, she declines today Urine Microalbumin: UTD Statin: none  Dietary changes since last visit: she has been trying to consume more fruits and vegetables. She is still eating some rice and pasta. She does eat air fried or grilled foods. She does eat mostly home cooked meals. She does drink a lot of water. She tries to avoid sodas and tea but will have it once in a while. She tries to use splenda in her tea. She has been cutting back on her sweets.   Exercise: She has been walking 4-5 times weekly for about an hour.   She reports that two months ago, she noticed that she gets short-winded while walking and feels that she is going to pass out while walking. In the past before her cancer diagnosis, she has been able to walk 10 miles without needing to stop. Episodes occur once a week or sometimes once every other week. Sometimes she is able to finish the walk but sometimes she has to come back home and just lay down and rest. She does have some dizziness, some chest tightness, diaphoresis and feels that she can't catch her full breath. She has also noticed that her fingers feel numb, tingly at night and when she first wakes up. She is unsure if it is positional. She denies any shortness of breath with deep inspiration. She reports having covid  in January and did not go for regular walks until February.   She has a 7 year history of smoking however she quit in 2000. She denies having any childhood asthma.    Patient Active Problem List   Diagnosis Date Noted   COVID-19 10/02/2022   Port-A-Cath in place 09/18/2021   Diffuse large B cell lymphoma ([place]) 09/01/2021   Counseling regarding advance care planning and goals of care 09/01/2021   Thoracic spine tumor 07/31/2021   Encounter for annual general medical examination with abnormal findings in adult 05/01/2021   Hot flashes 05/01/2021   Bone lesion 12/05/2020   Arthralgia of both hands 12/05/2020   Gross hematuria 11/20/2020   Acute bilateral low back pain without sciatica 06/15/2019   Essential hypertension 11/09/2018   Family history of colon cancer in mother and 2 aunts 06/03/2018   Mediastinal cyst 02/24/2017   Lipoma 08/13/2015   Pedal edema 07/26/2014   Exertional dyspnea 07/26/2014   Personal history of colonic polyps 01/18/2013   Anxiety 06/29/2011   Type 2 diabetes mellitus with hyperglycemia ([place]) 08/30/2008   Past Medical History:  Diagnosis Date   Abnormal uterine bleeding    Anemia    as a teenager   Anxiety    postpartum and hx panic attacks   Arrhythmia    Arthritis    back and hands   Chest mass    by CT scanning  Chest pain 123456   Complication of anesthesia    slow to wake up   COVID-19 10/2020   Depression    DOE (dyspnea on exertion) 07/26/2014   Family history of adverse reaction to anesthesia    mom slow to wake up   Gestational diabetes    resolved after pregnancy   History of kidney stones    Hypertension    Personal history of colonic polyps    PONV (postoperative nausea and vomiting)    PREMATURE ATRIAL CONTRACTIONS 08/15/2008   Qualifier: Diagnosis of  By: Council Mechanic MD, Hilaria Ota    RENAL CALCULUS, HX OF 08/15/2008   Qualifier: Diagnosis of  By: Zara Council LPN, Wanda     Rotator cuff tendinitis 06/29/2011   Seasonal  allergies    Urinary incontinence 05/2009   hx bladder sling--Dr. Quincy Simmonds   Past Surgical History:  Procedure Laterality Date   ABDOMINAL HYSTERECTOMY  05/2009   TAH/LSO   BLADDER SUSPENSION  2010   Dr. Quincy Simmonds   BREAST BIOPSY     left stereo   CESAREAN SECTION  03/2003   CESAREAN SECTION  2004   CHOLECYSTECTOMY N/A 01/02/2021   Procedure: LAPAROSCOPIC CHOLECYSTECTOMY;  Surgeon: Rolm Bookbinder, MD;  Location: [facility];  Service: General;  Laterality: N/A;  58  - GEN AND TAP BLOCK LDOW   COLONOSCOPY  09/08   COLPORRHAPHY  05/2009   Dr. Quincy Simmonds   ENDOMETRIAL ABLATION  2006   Dr. Quincy Simmonds   INCONTINENCE SURGERY     sling for urinary incontinence   IR IMAGING GUIDED PORT INSERTION  09/01/2021   IR REMOVAL TUN ACCESS W/ PORT W/O FL MOD SED  10/29/2022   KYPHOPLASTY N/A 04/07/2021   Procedure: Thoracic 12 Vertebral body biopsy;  Surgeon: Kristeen Miss, MD;  Location: [facility];  Service: Neurosurgery;  Laterality: N/A;   LAPAROSCOPIC ENDOMETRIOSIS FULGURATION     ablation   LUMBAR LAMINECTOMY/DECOMPRESSION MICRODISCECTOMY N/A 07/31/2021   Procedure: Open Transpedicular biopsy of Thoracic twelve;  Surgeon: Kristeen Miss, MD;  Location: [facility];  Service: Neurosurgery;  Laterality: N/A;   OOPHORECTOMY  2010   w/hyst--Dr. Quincy Simmonds   TUBAL LIGATION  2004   w/ C-section Dr. Quincy Simmonds   TYMPANOPLASTY     right ear   VAGINAL HYSTERECTOMY  05/21/2009   lap assisted /partial   Social History   Tobacco Use   Smoking status: Former    Years: 10.00    Types: Cigarettes    Quit date: 01/06/1999    Years since quitting: 23.9   Smokeless tobacco: Never  Vaping Use   Vaping Use: Never used  Substance Use Topics   Alcohol use: No    Alcohol/week: 0.0 standard drinks of alcohol   Drug use: No   Family History  Problem Relation Age of Onset   Kidney disease Mother        renal failure   Stroke Mother    Diabetes Mother    Hypertension Mother    Heart failure Mother    Colon cancer Mother    Stroke  Father    Cancer - Colon Maternal Aunt    Hypertension Sister    Rheum arthritis Sister    Hypertension Brother    Hypertension Sister    Hypertension Sister    Hypertension Sister    Breast cancer Sister    Kidney cancer Sister    Esophageal cancer Neg Hx    Rectal cancer Neg Hx    Stomach cancer Neg Hx  Allergies  Allergen Reactions   Buspar [Buspirone Hcl]     Worsened her panic attacks      Review of Systems  Respiratory:  Negative for shortness of breath.   Cardiovascular:  Negative for chest pain.  Gastrointestinal:  Negative for nausea and vomiting.  Genitourinary:  Negative for frequency and urgency.  Neurological:  Positive for dizziness. Negative for tingling, weakness and headaches.  Endo/Heme/Allergies:  Negative for polydipsia.      Objective:     BP 132/84   Pulse 88   Temp (!) 97.3 F (36.3 C) (Temporal)   Ht '5\' 4"'$  (1.626 m)   Wt 254 lb (115.2 kg)   SpO2 96%   BMI 43.60 kg/m  BP Readings from Last 3 Encounters:  12/09/22 132/84  10/16/22 (!) 151/83  10/02/22 138/78   Wt Readings from Last 3 Encounters:  12/09/22 254 lb (115.2 kg)  10/16/22 248 lb 4.8 oz (112.6 kg)  10/02/22 244 lb 8 oz (110.9 kg)      Physical Exam Vitals and nursing note reviewed.  Constitutional:      Appearance: Normal appearance.  Cardiovascular:     Rate and Rhythm: Normal rate and regular rhythm.     Pulses: Normal pulses.     Heart sounds: Normal heart sounds.  Pulmonary:     Effort: Pulmonary effort is normal.     Breath sounds: Normal breath sounds.  Skin:    General: Skin is warm.  Neurological:     General: No focal deficit present.     Mental Status: She is alert and oriented to person, place, and time.     Motor: No weakness.  Psychiatric:        Mood and Affect: Mood normal.        Behavior: Behavior normal.      Results for orders placed or performed in visit on 12/09/22  POCT glycosylated hemoglobin (Hb A1C)  Result Value Ref Range    Hemoglobin A1C 5.9 (A) 4.0 - 5.6 %   HbA1c POC (<> result, manual entry)     HbA1c, POC (prediabetic range)     HbA1c, POC (controlled diabetic range)         The 10-year ASCVD risk score (Arnett DK, et al., 2019) is: 6.8%    Assessment & Plan:   Problem List Items Addressed This Visit       Endocrine   Type 2 diabetes mellitus with hyperglycemia ([place]) - Primary    Controlled with hemoglobin A1C of 5.9 today.   Continue to stay off meds.   Continue to monitor your diet and regular exercise without overexertion.   Follow up in 6 months.       Relevant Orders   POCT glycosylated hemoglobin (Hb A1C) (Completed)   Lipid panel     Other   Exertional dyspnea    Unclear etiology.  Differentials include CAD, CHF, COPD   ECG completed today with normal sinus rhythm with no ST elevation, PACs or PVCs.  No red flag symptoms.   Discussed with patient with history of lymphoma, diabetes and hypertension, increased risk for CAD.  Educated patient to avoid overexertion while walking.   Chest x-ray pending. Lipid panel and BNP pending.  Referral for cardiology placed.   Await results.      Relevant Orders   EKG 12-Lead (Completed)   Brain natriuretic peptide   Ambulatory referral to Cardiology   DG Chest 2 View    Return in about 6 months (  around 06/09/2023) for physical.    Tinnie Gens, BSN-RN, DNP STUDENT

## 2022-12-09 NOTE — Assessment & Plan Note (Addendum)
Unclear etiology.  Differentials include CAD, CHF, COPD.   ECG completed today with normal sinus rhythm with no ST elevation, PACs or PVCs.  No red flag symptoms. Appears similar compared to ECC from 2022.  Discussed with patient with history of lymphoma, diabetes and hypertension that she is at an increased risk for CAD.  Educated patient to avoid overexertion while walking.   Chest x-ray pending. Lipid panel and BNP pending.  Referral for cardiology placed for further work up.  Await results.  I evaluated patient, was consulted regarding treatment, and agree with assessment and plan per Tinnie Gens, RN, DNP student.   Allie Bossier, NP-C

## 2022-12-09 NOTE — Patient Instructions (Addendum)
Stop by the lab prior to leaving today. I will notify you of your results once received.  Complete xray(s) prior to leaving today. I will notify you of your results once received.  Referral for cardiology has been placed and you will get a mychart message or a phone call with instructions of scheduling.   Great job with your diabetes.   Continue to monitor your diet.   It is important that you improve your diet. Please limit carbohydrates in the form of white bread, rice, pasta, sweets, fast food, fried food, sugary drinks, etc. Increase your consumption of fresh fruits and vegetables, whole grains, lean protein.  Ensure you are consuming 64 ounces of water daily.  It was a pleasure to see you today!  Follow up in August for a physical.

## 2022-12-20 NOTE — Progress Notes (Unsigned)
Cardiology Office Note:    Date:  12/22/2022   ID:  Virginia Oneal, DOB 12-11-1965, MRN [id number]  PCP:  Virginia Koch, NP  Cardiologist:  None  Electrophysiologist:  None   Referring MD: Virginia Koch, NP   Chief Complaint  Patient presents with   Chest Pain    History of Present Illness:    Virginia Oneal is a 57 y.o. female with a hx of hypertension, T2DM, large B-cell lymphoma s/p chemoradiation who is referred for evaluation of chest pain.  She reports she recently started walking and had chest pain.  Describes as tightness on left side of chest.  Also felt short of breath.  Happened 1-2 times when she tried walking.  Also reports shortness of breath with exertion.  She denies any lightheadedness, syncope, or lower extremity edema.  Does report some palpitations when she is exerting herself.  She quit smoking in 2000, smoked about 0.5 to 1 pack/day x 7 years.  Family history includes mother had MI at 47.  Echocardiogram 09/08/2021 showed normal biventricular function, no significant valvular disease.  Past Medical History:  Diagnosis Date   Abnormal uterine bleeding    Anemia    as a teenager   Anxiety    postpartum and hx panic attacks   Arrhythmia    Arthritis    back and hands   Chest mass    by CT scanning   Chest pain [id number]   Complication of anesthesia    slow to wake up   COVID-19 10/2020   Depression    DOE (dyspnea on exertion) 07/26/2014   Family history of adverse reaction to anesthesia    mom slow to wake up   Gestational diabetes    resolved after pregnancy   History of kidney stones    Hypertension    Personal history of colonic polyps    PONV (postoperative nausea and vomiting)    PREMATURE ATRIAL CONTRACTIONS 08/15/2008   Qualifier: Diagnosis of  By: Council Mechanic MD, Hilaria Ota    RENAL CALCULUS, HX OF 08/15/2008   Qualifier: Diagnosis of  By: Zara Council LPN, Wanda     Rotator cuff tendinitis 06/29/2011   Seasonal allergies    Urinary  incontinence 05/2009   hx bladder sling--Dr. Quincy Simmonds    Past Surgical History:  Procedure Laterality Date   ABDOMINAL HYSTERECTOMY  05/2009   TAH/LSO   BLADDER SUSPENSION  2010   Dr. Quincy Simmonds   BREAST BIOPSY     left stereo   CESAREAN SECTION  03/2003   CESAREAN SECTION  2004   CHOLECYSTECTOMY N/A 01/02/2021   Procedure: LAPAROSCOPIC CHOLECYSTECTOMY;  Surgeon: Rolm Bookbinder, MD;  Location: [facility];  Service: General;  Laterality: N/A;  58  - GEN AND TAP BLOCK LDOW   COLONOSCOPY  09/08   COLPORRHAPHY  05/2009   Dr. Quincy Simmonds   ENDOMETRIAL ABLATION  2006   Dr. Quincy Simmonds   INCONTINENCE SURGERY     sling for urinary incontinence   IR IMAGING GUIDED PORT INSERTION  09/01/2021   IR REMOVAL TUN ACCESS W/ PORT W/O FL MOD SED  10/29/2022   KYPHOPLASTY N/A 04/07/2021   Procedure: Thoracic 12 Vertebral body biopsy;  Surgeon: Kristeen Miss, MD;  Location: [place];  Service: Neurosurgery;  Laterality: N/A;   LAPAROSCOPIC ENDOMETRIOSIS FULGURATION     ablation   LUMBAR LAMINECTOMY/DECOMPRESSION MICRODISCECTOMY N/A 07/31/2021   Procedure: Open Transpedicular biopsy of Thoracic twelve;  Surgeon: Kristeen Miss, MD;  Location: [place];  Service:  Neurosurgery;  Laterality: N/A;   OOPHORECTOMY  2010   w/hyst--Dr. Quincy Simmonds   TUBAL LIGATION  2004   w/ C-section Dr. Quincy Simmonds   TYMPANOPLASTY     right ear   VAGINAL HYSTERECTOMY  05/21/2009   lap assisted /partial    Current Medications: Current Meds  Medication Sig   acetaminophen (TYLENOL) 500 MG tablet Take 1,000 mg by mouth every 6 (six) hours as needed for moderate pain or headache.   b complex vitamins capsule Take 1 capsule by mouth daily.   betamethasone valerate (VALISONE) 0.1 % cream Apply topically as needed.   cetirizine (ZYRTEC) 10 MG tablet Take 10 mg by mouth daily.    Cholecalciferol (VITAMIN D3) 125 MCG (5000 UT) CAPS Take 5,000 Units by mouth daily.   hydrOXYzine (ATARAX) 10 MG tablet Take 1 or 2 tablets by mouth as needed for anxiety attack.    losartan-hydrochlorothiazide (HYZAAR) 50-12.5 MG tablet Take 1 tablet by mouth daily. for blood pressure.   Methylcobalamin (B-12) 5000 MCG TBDP Take 5,000 mcg by mouth daily.   metoprolol tartrate (LOPRESSOR) 100 MG tablet Take 1 tablet (100 mg total) by mouth as directed. Take TWO HOURS prior to CT test   venlafaxine XR (EFFEXOR-XR) 37.5 MG 24 hr capsule TAKE 1 CAPSULE BY MOUTH EVERY DAY WITH BREAKFAST FOR HOT FLASHES AND ANXIETY     Allergies:   Buspar [buspirone hcl]   Social History   Socioeconomic History   Marital status: Married    Spouse name: Not on file   Number of children: 2   Years of education: Not on file   Highest education level: Not on file  Occupational History   Occupation: [employer]  Tobacco Use   Smoking status: Former    Years: 10.00    Types: Cigarettes    Quit date: 01/06/1999    Years since quitting: 23.9   Smokeless tobacco: Never  Vaping Use   Vaping Use: Never used  Substance and Sexual Activity   Alcohol use: No    Alcohol/week: 0.0 standard drinks of alcohol   Drug use: No   Sexual activity: Yes    Partners: Male    Birth control/protection: Other-see comments, Surgical    Comment: TAH/LSO  Other Topics Concern   Not on file  Social History Narrative   ** Merged History Encounter **       Social Determinants of Health   Financial Resource Strain: Not on file  Food Insecurity: Not on file  Transportation Needs: Not on file  Physical Activity: Not on file  Stress: Not on file  Social Connections: Not on file     Family History: The patient's family history includes Breast cancer in her sister; Cancer - Colon in her maternal aunt; Colon cancer in her mother; Diabetes in her mother; Heart failure in her mother; Hypertension in her brother, mother, sister, sister, sister, and sister; Kidney cancer in her sister; Kidney disease in her mother; Rheum arthritis in her sister; Stroke in her father and mother. There is no history of  Esophageal cancer, Rectal cancer, or Stomach cancer.  ROS:   Please see the history of present illness.     All other systems reviewed and are negative.  EKGs/Labs/Other Studies Reviewed:    The following studies were reviewed today:   EKG:   12/22/2022: Normal sinus rhythm, rate 94, no ST abnormalities  Recent Labs: 10/16/2022: ALT 25; BUN 13; Creatinine 0.72; Hemoglobin 14.0; Platelet Count 147; Potassium 4.3; Sodium 140 12/09/2022:  Pro B Natriuretic peptide (BNP) 17.0  Recent Lipid Panel    Component Value Date/Time   CHOL 185 12/09/2022 1032   TRIG 93.0 12/09/2022 1032   HDL 60.90 12/09/2022 1032   CHOLHDL 3 12/09/2022 1032   VLDL 18.6 12/09/2022 1032   LDLCALC 105 (H) 12/09/2022 1032    Physical Exam:    VS:  BP (!) 132/90   Pulse 94   Ht '5\' 4"'$  (1.626 m)   Wt 252 lb 9.6 oz (114.6 kg)   SpO2 96%   BMI 43.36 kg/m     Wt Readings from Last 3 Encounters:  12/22/22 252 lb 9.6 oz (114.6 kg)  12/09/22 254 lb (115.2 kg)  10/16/22 248 lb 4.8 oz (112.6 kg)     GEN:  Well nourished, well developed in no acute distress HEENT: Normal NECK: No JVD; No carotid bruits LYMPHATICS: No lymphadenopathy CARDIAC: RRR, no murmurs, rubs, gallops RESPIRATORY:  Clear to auscultation without rales, wheezing or rhonchi  ABDOMEN: Soft, non-tender, non-distended MUSCULOSKELETAL:  No edema; No deformity  SKIN: Warm and dry NEUROLOGIC:  Alert and oriented x 3 PSYCHIATRIC:  Normal affect   ASSESSMENT:    1. Chest pain, unspecified type   2. Pre-procedure lab exam   3. Essential hypertension   4. Hyperlipidemia, unspecified hyperlipidemia type    PLAN:    Chest pain: Description concerning for angina, as describes exertional chest tightness.  She does have CAD risk factors (age, hypertension, hyperlipidemia, prior tobacco use).  -Recommend coronary CTA to evaluate for obstructive CAD.  Will give Lopressor 100 mg prior to study -Echocardiogram to rule out structural heart  disease  Hypertension: On losartan-HCTZ 50-12.5 mg daily.  Hyperlipidemia: LDL 105 on 12/01/2022.  Will follow-up results of coronary CTA to guide how aggressive to be in lowering cholesterol  Large B-cell lymphoma: Status post chemoradiation.  In remission.  RTC in 3 months   Medication Adjustments/Labs and Tests Ordered: Current medicines are reviewed at length with the patient today.  Concerns regarding medicines are outlined above.  Orders Placed This Encounter  Procedures   CT CORONARY MORPH W/CTA COR W/SCORE W/CA W/CM &/OR WO/CM   Basic metabolic panel   EKG [id number]   ECHOCARDIOGRAM COMPLETE   Meds ordered this encounter  Medications   metoprolol tartrate (LOPRESSOR) 100 MG tablet    Sig: Take 1 tablet (100 mg total) by mouth as directed. Take TWO HOURS prior to CT test    Dispense:  1 tablet    Refill:  0    Patient Instructions  Medication Instructions:  NO CHANGES  *If you need a refill on your cardiac medications before your next appointment, please call your pharmacy*   Lab Work: BMET today   If you have labs (blood work) drawn today and your tests are completely normal, you will receive your results only by: [place] (if you have MyChart) OR A paper copy in the mail If you have any lab test that is abnormal or we need to change your treatment, we will call you to review the results.   Testing/Procedures: Your physician has requested that you have an echocardiogram. Echocardiography is a painless test that uses sound waves to create images of your heart. It provides your doctor with information about the size and shape of your heart and how well your heart's chambers and valves are working. This procedure takes approximately one hour. There are no restrictions for this procedure. Please do NOT wear cologne, perfume, aftershave, or lotions (deodorant  is allowed). Please arrive 15 minutes prior to your appointment time. This test is done at [place] [address] 3rd Floor [place] OR [place]  Coronary CT at [place]: At [facility], you and your health needs are our priority.  As part of our continuing mission to provide you with exceptional heart care, we have created designated Provider Care Teams.  These Care Teams include your primary Cardiologist (physician) and Advanced Practice Providers (APPs -  Physician Assistants and Nurse Practitioners) who all work together to provide you with the care you need, when you need it.  We recommend signing up for the patient portal called "MyChart".  Sign up information is provided on this After Visit Summary.  MyChart is used to connect with patients for Virtual Visits (Telemedicine).  Patients are able to view lab/test results, encounter notes, upcoming appointments, etc.  Non-urgent messages can be sent to your provider as well.   To learn more about what you can do with MyChart, go to [email or web address].    Your next appointment:    3 months with Dr. Gardiner Rhyme    Your cardiac CT will be scheduled at one of the below locations:   [facility] [address] [place], [place] [address] [phone]  [place] [address] [place], [place] [address] [phone]  [facility] [place], [place] [address] [phone]  If scheduled at [facility], please arrive at the [facility] and Children's Entrance (Entrance C2) of [facility] 30 minutes prior to test start time. You can use the FREE valet parking offered at entrance C (encouraged to control the heart rate for the test)  Proceed to the [facility] Radiology Department (first floor) to check-in and test prep.  All radiology patients and guests should use entrance C2 at [facility], accessed from [facility] even though the hospital's physical address listed is [address].    If scheduled at [facility] or [facility], please arrive 15 mins early for check-in and test prep.   Please follow these instructions carefully (unless otherwise directed):  Hold all erectile dysfunction medications at least 3 days (72 hrs) prior to test. (Ie viagra, cialis, sildenafil, tadalafil, etc) We will administer nitroglycerin during this exam.   On the Night Before the Test: Be sure to Drink plenty of water. Do not consume any caffeinated/decaffeinated beverages or chocolate 12 hours prior to your test. Do not take any antihistamines 12 hours prior to your test.  On the Day of the Test: Drink plenty of water until 1 hour prior to the test. Do not eat any food 1 hour prior to test. You may take your regular medications prior to the test.  Take metoprolol (Lopressor) two hours prior to test. If you take Furosemide/Hydrochlorothiazide/Spironolactone, please HOLD on the morning of the test. FEMALES- please wear underwire-free bra if available, avoid dresses & tight clothing   After the Test: Drink plenty of water. After receiving IV contrast, you may experience a mild flushed feeling. This is normal. On occasion, you may experience a mild rash up to 24 hours after the test. This is not dangerous. If this occurs, you can take Benadryl 25 mg and increase your fluid intake. If you experience trouble breathing, this can be  serious. If it is severe call 911 IMMEDIATELY. If it is mild, please call our office. If you take any of these medications: Glipizide/Metformin, Avandament, Glucavance, please do not take 48 hours after completing test unless otherwise instructed.  We will call to schedule your test 2-4 weeks out understanding that some insurance companies will need an authorization prior to the service being performed.   For  non-scheduling related questions, please contact the cardiac imaging nurse navigator should you have any questions/concerns: Marchia Bond, Cardiac Imaging Nurse Navigator Gordy Clement, Cardiac Imaging Nurse Navigator [facility] Direct Office Dial: [phone]   For scheduling needs, including cancellations and rescheduling, please call [place], [phone].    Signed, Donato Heinz, MD  12/22/2022 3:33 PM    [place]

## 2022-12-22 ENCOUNTER — Encounter: Payer: Self-pay | Admitting: Cardiology

## 2022-12-22 ENCOUNTER — Ambulatory Visit: Payer: 59 | Attending: Cardiology | Admitting: Cardiology

## 2022-12-22 VITALS — BP 132/90 | HR 94 | Ht 64.0 in | Wt 252.6 lb

## 2022-12-22 DIAGNOSIS — R079 Chest pain, unspecified: Secondary | ICD-10-CM | POA: Diagnosis not present

## 2022-12-22 DIAGNOSIS — E785 Hyperlipidemia, unspecified: Secondary | ICD-10-CM

## 2022-12-22 DIAGNOSIS — Z01812 Encounter for preprocedural laboratory examination: Secondary | ICD-10-CM | POA: Diagnosis not present

## 2022-12-22 DIAGNOSIS — I10 Essential (primary) hypertension: Secondary | ICD-10-CM

## 2022-12-22 LAB — BASIC METABOLIC PANEL
BUN/Creatinine Ratio: 22 (ref 9–23)
BUN: 16 mg/dL (ref 6–24)
CO2: 29 mmol/L (ref 20–29)
Calcium: 10.4 mg/dL — ABNORMAL HIGH (ref 8.7–10.2)
Chloride: 100 mmol/L (ref 96–106)
Creatinine, Ser: 0.72 mg/dL (ref 0.57–1.00)
Glucose: 100 mg/dL — ABNORMAL HIGH (ref 70–99)
Potassium: 4.8 mmol/L (ref 3.5–5.2)
Sodium: 139 mmol/L (ref 134–144)
eGFR: 97 mL/min/{1.73_m2} (ref >59)

## 2022-12-22 MED ORDER — METOPROLOL TARTRATE 100 MG PO TABS: 100.0000 mg | ORAL_TABLET | ORAL | 0 refills | Status: DC

## 2022-12-22 NOTE — Patient Instructions (Signed)
Medication Instructions:  NO CHANGES  *If you need a refill on your cardiac medications before your next appointment, please call your pharmacy*   Lab Work: BMET today   If you have labs (blood work) drawn today and your tests are completely normal, you will receive your results only by: [place] (if you have MyChart) OR A paper copy in the mail If you have any lab test that is abnormal or we need to change your treatment, we will call you to review the results.   Testing/Procedures: Your physician has requested that you have an echocardiogram. Echocardiography is a painless test that uses sound waves to create images of your heart. It provides your doctor with information about the size and shape of your heart and how well your heart's chambers and valves are working. This procedure takes approximately one hour. There are no restrictions for this procedure. Please do NOT wear cologne, perfume, aftershave, or lotions (deodorant is allowed). Please arrive 15 minutes prior to your appointment time. This test is done at [place] [address] 3rd Floor [place] OR [place]  Coronary CT at [place]: At [facility], you and your health needs are our priority.  As part of our continuing mission to provide you with exceptional heart care, we have created designated Provider Care Teams.  These Care Teams include your primary Cardiologist (physician) and Advanced Practice Providers (APPs -  Physician Assistants and Nurse Practitioners) who all work together to provide you with the care you need, when you need it.  We recommend signing up for the patient portal called "MyChart".  Sign up information is provided on this After Visit Summary.  MyChart is used to connect with patients for Virtual Visits (Telemedicine).  Patients are able to view lab/test results, encounter notes, upcoming  appointments, etc.  Non-urgent messages can be sent to your provider as well.   To learn more about what you can do with MyChart, go to [email or web address].    Your next appointment:    3 months with Dr. Gardiner Rhyme    Your cardiac CT will be scheduled at one of the below locations:   [facility] [address] [place], [place] [address] [phone]  [place] [address] [place], [place] [address] [phone]  [facility] [place], [place] [address] [phone]  If scheduled at [facility], please arrive at the [facility] and Children's Entrance (Entrance C2) of [facility] 30 minutes prior to test start time. You can use the FREE valet parking offered at entrance C (encouraged to control the heart rate for the test)  Proceed to the [facility] Radiology Department (first floor) to check-in and test prep.  All radiology patients and guests should use entrance C2 at [facility], accessed from [facility], even though the hospital's physical address listed is [address].    If scheduled at [facility] or [facility], please arrive 15 mins early for check-in and test prep.   Please follow these instructions carefully (unless otherwise directed):  Hold all erectile dysfunction medications at least 3 days (72 hrs) prior to test. (Ie viagra, cialis, sildenafil, tadalafil, etc) We will administer nitroglycerin during this exam.   On the Night Before the Test: Be sure to Drink plenty  of water. Do not consume any caffeinated/decaffeinated beverages or chocolate 12 hours prior to your test. Do not take any antihistamines 12 hours prior to your test.  On the Day of the Test: Drink plenty of water until 1 hour prior to the test. Do not eat any food 1 hour prior to  test. You may take your regular medications prior to the test.  Take metoprolol (Lopressor) two hours prior to test. If you take Furosemide/Hydrochlorothiazide/Spironolactone, please HOLD on the morning of the test. FEMALES- please wear underwire-free bra if available, avoid dresses & tight clothing   After the Test: Drink plenty of water. After receiving IV contrast, you may experience a mild flushed feeling. This is normal. On occasion, you may experience a mild rash up to 24 hours after the test. This is not dangerous. If this occurs, you can take Benadryl 25 mg and increase your fluid intake. If you experience trouble breathing, this can be serious. If it is severe call 911 IMMEDIATELY. If it is mild, please call our office. If you take any of these medications: Glipizide/Metformin, Avandament, Glucavance, please do not take 48 hours after completing test unless otherwise instructed.  We will call to schedule your test 2-4 weeks out understanding that some insurance companies will need an authorization prior to the service being performed.   For non-scheduling related questions, please contact the cardiac imaging nurse navigator should you have any questions/concerns: Marchia Bond, Cardiac Imaging Nurse Navigator Gordy Clement, Cardiac Imaging Nurse Navigator [place] [facility] Direct Office Dial: [phone]   For scheduling needs, including cancellations and rescheduling, please call [place], [phone].

## 2023-01-01 ENCOUNTER — Telehealth (HOSPITAL_COMMUNITY): Payer: Self-pay | Admitting: Emergency Medicine

## 2023-01-01 NOTE — Telephone Encounter (Signed)
Reaching out to patient to offer assistance regarding upcoming cardiac imaging study; pt verbalizes understanding of appt date/time, parking situation and where to check in, pre-test NPO status and medications ordered, and verified current allergies; name and call back number provided for further questions should they arise Marchia Bond RN Navigator Cardiac Imaging [facility] [phone] office [phone] cell  Arrival 7[address] entrance 100mg  metoprolol tartrate Aware contrast/nitro

## 2023-01-04 ENCOUNTER — Ambulatory Visit (HOSPITAL_COMMUNITY)
Admission: RE | Admit: 2023-01-04 | Discharge: 2023-01-04 | Disposition: A | Discharge status: Home / Self Care | Payer: 59 | Source: Ambulatory Visit | Attending: Cardiology | Admitting: Cardiology

## 2023-01-04 DIAGNOSIS — I2510 Atherosclerotic heart disease of native coronary artery without angina pectoris: Secondary | ICD-10-CM | POA: Diagnosis not present

## 2023-01-04 DIAGNOSIS — R079 Chest pain, unspecified: Secondary | ICD-10-CM | POA: Diagnosis not present

## 2023-01-04 MED ORDER — NITROGLYCERIN 0.4 MG SL SUBL: 0.8000 mg | SUBLINGUAL_TABLET | Freq: Once | SUBLINGUAL | Status: AC

## 2023-01-04 MED ORDER — NITROGLYCERIN 0.4 MG SL SUBL
SUBLINGUAL_TABLET | SUBLINGUAL | Status: AC
  Administered 2023-01-04: 0.8 mg via SUBLINGUAL
  Filled 2023-01-04: qty 2

## 2023-01-04 MED ORDER — METOPROLOL TARTRATE 5 MG/5ML IV SOLN
INTRAVENOUS | Status: AC
  Filled 2023-01-04: qty 10

## 2023-01-04 MED ORDER — METOPROLOL TARTRATE 5 MG/5ML IV SOLN
10.0000 mg | Freq: Once | INTRAVENOUS | Status: AC
  Administered 2023-01-04: 10 mg via INTRAVENOUS

## 2023-01-04 MED ORDER — IOHEXOL 350 MG/ML SOLN
100.0000 mL | Freq: Once | INTRAVENOUS | Status: AC | PRN
  Administered 2023-01-04: 100 mL via INTRAVENOUS

## 2023-01-05 ENCOUNTER — Other Ambulatory Visit: Payer: Self-pay | Admitting: *Deleted

## 2023-01-05 DIAGNOSIS — E785 Hyperlipidemia, unspecified: Secondary | ICD-10-CM

## 2023-01-05 MED ORDER — ATORVASTATIN CALCIUM 20 MG PO TABS: 20.0000 mg | ORAL_TABLET | Freq: Every day | ORAL | 3 refills | Status: DC

## 2023-01-18 ENCOUNTER — Other Ambulatory Visit: Payer: Self-pay

## 2023-01-18 ENCOUNTER — Inpatient Hospital Stay: Payer: 59 | Attending: Hematology

## 2023-01-18 ENCOUNTER — Inpatient Hospital Stay (HOSPITAL_BASED_OUTPATIENT_CLINIC_OR_DEPARTMENT_OTHER): Payer: 59 | Admitting: Hematology

## 2023-01-18 VITALS — BP 137/86 | HR 93 | Temp 97.5°F | Resp 20 | Wt 252.8 lb

## 2023-01-18 DIAGNOSIS — Z8572 Personal history of non-Hodgkin lymphomas: Secondary | ICD-10-CM | POA: Insufficient documentation

## 2023-01-18 DIAGNOSIS — C8330 Diffuse large B-cell lymphoma, unspecified site: Secondary | ICD-10-CM

## 2023-01-18 DIAGNOSIS — C8512 Unspecified B-cell lymphoma, intrathoracic lymph nodes: Secondary | ICD-10-CM | POA: Diagnosis present

## 2023-01-18 LAB — CMP (CANCER CENTER ONLY)
ALT: 27 U/L (ref 0–44)
AST: 21 U/L (ref 15–41)
Albumin: 4.3 g/dL (ref 3.5–5.0)
Alkaline Phosphatase: 89 U/L (ref 38–126)
Anion gap: 6 (ref 5–15)
BUN: 18 mg/dL (ref 6–20)
CO2: 32 mmol/L (ref 22–32)
Calcium: 10.1 mg/dL (ref 8.9–10.3)
Chloride: 103 mmol/L (ref 98–111)
Creatinine: 0.75 mg/dL (ref 0.44–1.00)
GFR, Estimated: >60 mL/min (ref >60)
Glucose, Bld: 120 mg/dL — ABNORMAL HIGH (ref 70–99)
Potassium: 4 mmol/L (ref 3.5–5.1)
Sodium: 141 mmol/L (ref 135–145)
Total Bilirubin: 0.6 mg/dL (ref 0.3–1.2)
Total Protein: 6.8 g/dL (ref 6.5–8.1)

## 2023-01-18 LAB — LACTATE DEHYDROGENASE: LDH: 180 U/L (ref 98–192)

## 2023-01-18 LAB — CBC WITH DIFFERENTIAL (CANCER CENTER ONLY)
Abs Immature Granulocytes: 0.02 10*3/uL (ref 0.00–0.07)
Basophils Absolute: 0 10*3/uL (ref 0.0–0.1)
Basophils Relative: 0 %
Eosinophils Absolute: 0.1 10*3/uL (ref 0.0–0.5)
Eosinophils Relative: 2 %
HCT: 39.9 % (ref 36.0–46.0)
Hemoglobin: 13.9 g/dL (ref 12.0–15.0)
Immature Granulocytes: 0 %
Lymphocytes Relative: 19 %
Lymphs Abs: 1.2 10*3/uL (ref 0.7–4.0)
MCH: 31.7 pg (ref 26.0–34.0)
MCHC: 34.8 g/dL (ref 30.0–36.0)
MCV: 90.9 fL (ref 80.0–100.0)
Monocytes Absolute: 0.5 10*3/uL (ref 0.1–1.0)
Monocytes Relative: 8 %
Neutro Abs: 4.4 10*3/uL (ref 1.7–7.7)
Neutrophils Relative %: 71 %
Platelet Count: 150 10*3/uL (ref 150–400)
RBC: 4.39 MIL/uL (ref 3.87–5.11)
RDW: 12.4 % (ref 11.5–15.5)
WBC Count: 6.3 10*3/uL (ref 4.0–10.5)
nRBC: 0 % (ref 0.0–0.2)

## 2023-01-18 NOTE — Progress Notes (Signed)
[name].   HEMATOLOGY/ONCOLOGY CLINIC NOTE  Date of Service: 01/18/23  Patient Care Team: Doreene Nestlark, Katherine K, NP as PCP - General (Internal Medicine) Barnett AbuElsner, Henry, MD as Consulting Physician (Neurosurgery) Emelia LoronWakefield, Matthew, MD as Consulting Physician (General Surgery)  CHIEF COMPLAINTS/PURPOSE OF CONSULTATION:  Follow-up for continued evaluation and management of large B-cell lymphoma and for PET CT review after 6 cycles of R-CHOP chemotherapy  HISTORY OF PRESENTING ILLNESS:  Please see previous note for details of initial presentation  INTERVAL HISTORY:  Virginia Oneal is a 57 y.o. female is here for follow-up of her diffuse large B-cell lymphoma.  Patient was last seen by me on 10/16/2022 and she was doing well overall, except mild general bone pain.  Patient reports she has been doing well overall since our last visit. She does complains of increased fatigue since our last visit, which has been causing her to sleep more than usual.   She complains of occasional wheezing/shortness of breath while walking. Patient contributes this symptom to cold air. She has been following up with her Cardiologist, Pulmonologist, and her PCP. She is scheduled for echocardiogram next week.   Patient has been staying physically active and has been staying well hydrated.   Patient has been started on cholesterol medication since our last visit.  She denies fever, chills, night sweats, abdominal pain, new lumps/bumps, chest pain, abnormal bowel movement, or leg swelling. She complains of chronic back pain.    MEDICAL HISTORY:  Past Medical History:  Diagnosis Date   Abnormal uterine bleeding    Anemia    as a teenager   Anxiety    postpartum and hx panic attacks   Arrhythmia    Arthritis    back and hands   Chest mass    by CT scanning   Chest pain 07/26/2014   Complication of anesthesia    slow to wake up   COVID-19 10/2020   Depression    DOE (dyspnea on exertion) 07/26/2014    Family history of adverse reaction to anesthesia    mom slow to wake up   Gestational diabetes    resolved after pregnancy   History of kidney stones    Hypertension    Personal history of colonic polyps    PONV (postoperative nausea and vomiting)    PREMATURE ATRIAL CONTRACTIONS 08/15/2008   Qualifier: Diagnosis of  By: Hetty ElySchaller MD, Franne Gripobert Neal    RENAL CALCULUS, HX OF 08/15/2008   Qualifier: Diagnosis of  By: Lorrene Reidombs LPN, Wanda     Rotator cuff tendinitis 06/29/2011   Seasonal allergies    Urinary incontinence 05/2009   hx bladder sling--Dr. Edward JollySilva    SURGICAL HISTORY: Past Surgical History:  Procedure Laterality Date   ABDOMINAL HYSTERECTOMY  05/2009   TAH/LSO   BLADDER SUSPENSION  2010   Dr. Edward JollySilva   BREAST BIOPSY     left stereo   CESAREAN SECTION  03/2003   CESAREAN SECTION  2004   CHOLECYSTECTOMY N/A 01/02/2021   Procedure: LAPAROSCOPIC CHOLECYSTECTOMY;  Surgeon: Emelia LoronWakefield, Matthew, MD;  Location: [facility];  Service: General;  Laterality: N/A;  58  - GEN AND TAP BLOCK LDOW   COLONOSCOPY  09/08   COLPORRHAPHY  05/2009   Dr. Edward JollySilva   ENDOMETRIAL ABLATION  2006   Dr. Edward JollySilva   INCONTINENCE SURGERY     sling for urinary incontinence   IR IMAGING GUIDED PORT INSERTION  09/01/2021   IR REMOVAL TUN ACCESS W/ PORT W/O FL MOD SED  10/29/2022  KYPHOPLASTY N/A 04/07/2021   Procedure: Thoracic 12 Vertebral body biopsy;  Surgeon: Barnett Abu, MD;  Location[facility];  Service: Neurosurgery;  Laterality: N/A;   LAPAROSCOPIC ENDOMETRIOSIS FULGURATION     ablation   LUMBAR LAMINECTOMY/DECOMPRESSION MICRODISCECTOMY N/A 07/31/2021   Procedure: Open Transpedicular biopsy of Thoracic twelve;  Surgeon: Barnett Abu, MD;  Location: [facility];  Service: Neurosurgery;  Laterality: N/A;   OOPHORECTOMY  2010   w/hyst--Dr. Edward Jolly   TUBAL LIGATION  2004   w/ C-section Dr. Edward Jolly   TYMPANOPLASTY     right ear   VAGINAL HYSTERECTOMY  05/21/2009   lap assisted /partial    SOCIAL HISTORY: Social History    Socioeconomic History   Marital status: Married    Spouse name: Not on file   Number of children: 2   Years of education: Not on file   Highest education level: Not on file  Occupational History   Occupation: [employer]  Tobacco Use   Smoking status: Former    Years: 10    Types: Cigarettes    Quit date: 01/06/1999    Years since quitting: 24.0   Smokeless tobacco: Never  Vaping Use   Vaping Use: Never used  Substance and Sexual Activity   Alcohol use: No    Alcohol/week: 0.0 standard drinks of alcohol   Drug use: No   Sexual activity: Yes    Partners: Male    Birth control/protection: Other-see comments, Surgical    Comment: TAH/LSO  Other Topics Concern   Not on file  Social History Narrative   ** Merged History Encounter **       Social Determinants of Health   Financial Resource Strain: Not on file  Food Insecurity: Not on file  Transportation Needs: Not on file  Physical Activity: Not on file  Stress: Not on file  Social Connections: Not on file  Intimate Partner Violence: Not on file    FAMILY HISTORY: Family History  Problem Relation Age of Onset   Kidney disease Mother        renal failure   Stroke Mother    Diabetes Mother    Hypertension Mother    Heart failure Mother    Colon cancer Mother    Stroke Father    Cancer - Colon Maternal Aunt    Hypertension Sister    Rheum arthritis Sister    Hypertension Brother    Hypertension Sister    Hypertension Sister    Hypertension Sister    Breast cancer Sister    Kidney cancer Sister    Esophageal cancer Neg Hx    Rectal cancer Neg Hx    Stomach cancer Neg Hx     ALLERGIES:  is allergic to buspar [buspirone hcl].  MEDICATIONS:  Current Outpatient Medications  Medication Sig Dispense Refill   acetaminophen (TYLENOL) 500 MG tablet Take 1,000 mg by mouth every 6 (six) hours as needed for moderate pain or headache.     atorvastatin (LIPITOR) 20 MG tablet Take 1 tablet (20 mg total) by  mouth daily. 90 tablet 3   b complex vitamins capsule Take 1 capsule by mouth daily.     betamethasone valerate (VALISONE) 0.1 % cream Apply topically as needed.     cetirizine (ZYRTEC) 10 MG tablet Take 10 mg by mouth daily.      Cholecalciferol (VITAMIN D3) 125 MCG (5000 UT) CAPS Take 5,000 Units by mouth daily.     hydrOXYzine (ATARAX) 10 MG tablet Take 1 or 2  tablets by mouth as needed for anxiety attack. 15 tablet 0   lidocaine-prilocaine (EMLA) cream Apply to affected area once (Patient not taking: Reported on 12/22/2022) 30 g 3   losartan-hydrochlorothiazide (HYZAAR) 50-12.5 MG tablet Take 1 tablet by mouth daily. for blood pressure. 90 tablet 3   Methylcobalamin (B-12) 5000 MCG TBDP Take 5,000 mcg by mouth daily.     metoprolol tartrate (LOPRESSOR) 100 MG tablet Take 1 tablet (100 mg total) by mouth as directed. Take TWO HOURS prior to CT test 1 tablet 0   venlafaxine XR (EFFEXOR-XR) 37.5 MG 24 hr capsule TAKE 1 CAPSULE BY MOUTH EVERY DAY WITH BREAKFAST FOR HOT FLASHES AND ANXIETY 90 capsule 2   No current facility-administered medications for this visit.    REVIEW OF SYSTEMS:   10 Point review of Systems was done is negative except as noted above.  PHYSICAL EXAMINATION: .BP 137/86   Pulse 93   Temp (!) 97.5 F (36.4 C)   Resp 20   Wt 252 lb 12.8 oz (114.7 kg)   SpO2 98%   BMI 43.39 kg/m  NAD GENERAL:alert, in no acute distress and comfortable SKIN: no acute rashes, no significant lesions EYES: conjunctiva are pink and non-injected, sclera anicteric OROPHARYNX: MMM, no exudates, no oropharyngeal erythema or ulceration NECK: supple, no JVD LYMPH:  no palpable lymphadenopathy in the cervical, axillary or inguinal regions LUNGS: clear to auscultation b/l with normal respiratory effort HEART: regular rate & rhythm ABDOMEN:  normoactive bowel sounds , non tender, not distended. Extremity: no pedal edema PSYCH: alert & oriented x 3 with fluent speech NEURO: no focal  motor/sensory deficits  LABORATORY DATA:  I have reviewed the data as listed  .    Latest Ref Rng & Units 01/18/2023   10:03 AM 10/16/2022    9:57 AM 07/10/2022   12:30 PM  CBC  WBC 4.0 - 10.5 K/uL 6.3  4.3  4.8   Hemoglobin 12.0 - 15.0 g/dL 16.1  09.6  04.5   Hematocrit 36.0 - 46.0 % 39.9  39.9  36.3   Platelets 150 - 400 K/uL 150  147  148    .    Latest Ref Rng & Units 01/18/2023   10:03 AM 12/22/2022   11:43 AM 10/16/2022    9:57 AM  CMP  Glucose 70 - 99 mg/dL 409  811  914   BUN 6 - 20 mg/dL 18  16  13    Creatinine 0.44 - 1.00 mg/dL 7.82  9.56  2.13   Sodium 135 - 145 mmol/L 141  139  140   Potassium 3.5 - 5.1 mmol/L 4.0  4.8  4.3   Chloride 98 - 111 mmol/L 103  100  105   CO2 22 - 32 mmol/L 32  29  27   Calcium 8.9 - 10.3 mg/dL 08.6  57.8  9.6   Total Protein 6.5 - 8.1 g/dL 6.8   6.1   Total Bilirubin 0.3 - 1.2 mg/dL 0.6   0.5   Alkaline Phos 38 - 126 U/L 89   94   AST 15 - 41 U/L 21   20   ALT 0 - 44 U/L 27   25    Lab Results  Component Value Date   LDH 180 01/18/2023    HIV Screen 4th Generation wRfx     Non Reactive Non Reactive  Hepatitis B Surface Ag     NON REACTIVE NON REACTIVE  Hep B Core Total Ab  NON REACTIVE NON REACTIVE  HCV Ab     NON REACTIVE NON REACTIVE  LDH     98 - 192 U/L 228 (H)    SURGICAL PATHOLOGY  CASE: [id number]  PATIENT: Hallee Feijoo  Surgical Pathology Report   Clinical History: lesion of thoracic vertebra (cm)   FINAL MICROSCOPIC DIAGNOSIS:   A-C. BONE, T12 VERTEBRAL BODY AND MARROW, BIOPSY:  -  Non-Hodgkin B-cell lymphoma, high grade  -  See comment   COMMENT:   A-C.  The biopsies show normal hematopoeisis admixed with crush artifact  with associated marrow fibrosis and an atypical cellular infiltrate  composed of medium to large cells with irregular nuclear contours, ample  cytoplasm, variably prominent nucleoli, increased mitoses and apoptosis.  An initial panel of immunohistochemistry (cytokeratin AE1/3,  cytokeratin  7, cytokeratin 20, CD20 and CD3) confirmed the morphologic impression  that the atypical cells were lymphoid in nature.  The atypical  lymphocytes are positive for Pax-5, CD20, CD10, bcl-6 but negative for  CD30, bcl-2, mum-1 and EBV by in-situ hybridization. CD3 and CD5  highlight background T-cells.  The proliferative rate by ki-67 is  increased (50-60%).  Overall, the findings are consistent with a  non-Hodgkin B-cell lymphoma, high grade with a germinal center  phenotype.   FISH studies are pending and will be reported in an  addendum.  These findings were discussed with Dr. Danielle DessElsner on August 06, 2021.    RADIOGRAPHIC STUDIES: I have personally reviewed the radiological images as listed and agreed with the findings in the report.  ECHO 09/08/2021 1.Left ventricular ejection fraction, by estimation, is 55 to 60%. The left ventricle has normal function. The left ventricle has no regional wall motion abnormalities. There ismild left ventricular hypertrophy. Left ventricular diastolic parameters were normal.The average left ventricular global longitudinal strain is -20.4 %. The global longitudinal strain is normal. 2. Right ventricular systolic function is normal. The right ventricular size is normal. 3. Left atrial size was mildly dilated. The mitral valve is normal in structure. Trivial mitral valve regurgitation. No evidence of mitral stenosis. Moderate mitral annular calcification. 4.The aortic valve is normal in structure. Aortic valve regurgitation is not visualized. No aortic stenosis is present. 5.The inferior vena cava is normal in size with greater than 50% respiratory variability, suggesting right atrial pressure of 3 mmHg.  PET/CT scan 09/09/2022  IMPRESSION: 1. No specific findings of active malignancy. Previous low-grade activity in the T12 vertebral body has resolved. 2. Focal accentuated activity in the distal esophagus with maximum SUV 4.4, likely  physiologic, no CT correlate or significant accentuated activity on the prior exam in this region. 3. Hepatic steatosis. 4. Nonobstructive bilateral nephrolithiasis. 5. Aortic and coronary atherosclerosis.  Mitral valve calcification.   ASSESSMENT & PLAN:   57 year old female with  1) Stage IVA high-grade large B cell non-Hodgkin's lymphoma.  Germinal center phenotype.  C-Myc not rearranged.  Ki-67 50 to 60% No overt constitutional symptoms PET/CT with involvement of thoracic spine and acromion process on the right.  Abdominal lymphadenopathy.  Hepatitis testing unremarkable HIV nonreactive Myeloma panel shows no M spike protein spike.  Echocardiogram shows normal ejection fraction  PET CT scan done on 09/02/2021 shows Scattered mild hypermetabolism within subcentimeter periportal, retroperitoneal and mesenteric lymph nodes, compatible with the given history of large B-cell lymphoma. 2. Hypermetabolism within known T7 and T12 lesions, better seen on MR thoracic spine 07/04/2021. 3. Focal hypermetabolism in the acromion of the right scapula, without a definite CT correlate. Recommend attention  on follow-up.  2) Chemotherapy related neutropenia -now resolved.  No issues with infections.    3)h/o  G-CSF related significant bone pain.  PLAN -discussed lab results from today, 01/18/2023, with the patient. CBC and CMP are stable. LDH wnl at 180 -No indication of recurrence/progression of ymphoma.  -Answered all of patient's questions.  -Continue to follow-up with PCP and cardiologist.   FOLLOW-UP: RTC with Dr Candise Che with labs in 4 months  The total time spent in the appointment was 20 minutes* .  All of the patient's questions were answered with apparent satisfaction. The patient knows to call the clinic with any problems, questions or concerns.   Wyvonnia Lora MD MS AAHIVMS [facility] Hematology/Oncology Physician [facility]  .*Total Encounter Time as defined by the  Centers for Medicare and Medicaid Services includes, in addition to the face-to-face time of a patient visit (documented in the note above) non-face-to-face time: obtaining and reviewing outside history, ordering and reviewing medications, tests or procedures, care coordination (communications with other health care professionals or caregivers) and documentation in the medical record.   I, Ok Edwards, am acting as a Neurosurgeon for Wyvonnia Lora, MD. .I have reviewed the above documentation for accuracy and completeness, and I agree with the above. Johney Maine MD

## 2023-01-19 ENCOUNTER — Telehealth: Payer: Self-pay | Admitting: Hematology

## 2023-01-19 NOTE — Telephone Encounter (Signed)
Called patient per 4/8 los notes to schedule f/u. Patient scheduled and notified.  

## 2023-01-20 ENCOUNTER — Ambulatory Visit (HOSPITAL_COMMUNITY): Payer: 59 | Attending: Cardiology

## 2023-01-20 DIAGNOSIS — R079 Chest pain, unspecified: Secondary | ICD-10-CM

## 2023-01-20 LAB — ECHOCARDIOGRAM COMPLETE
Area-P 1/2: 3.1 cm2
S' Lateral: 2.5 cm

## 2023-01-25 ENCOUNTER — Encounter: Payer: Self-pay | Admitting: Hematology

## 2023-01-25 NOTE — Progress Notes (Incomplete)
Virginia Oneal   HEMATOLOGY/ONCOLOGY CLINIC NOTE  Date of Service: 01/18/23  Patient Care Team: Doreene Nest, NP as PCP - General (Internal Medicine) Barnett Abu, MD as Consulting Physician (Neurosurgery) Emelia Loron, MD as Consulting Physician (General Surgery)  CHIEF COMPLAINTS/PURPOSE OF CONSULTATION:  Follow-up for continued evaluation and management of large B-cell lymphoma and for PET CT review after 6 cycles of R-CHOP chemotherapy  HISTORY OF PRESENTING ILLNESS:  Please see previous note for details of initial presentation  INTERVAL HISTORY:  Virginia Oneal is a 57 y.o. female is here for follow-up of her diffuse large B-cell lymphoma.  Patient was last seen by me on 10/16/2022 and she was doing well overall, except mild general bone pain.  Patient reports she has been doing well overall since our last visit. She does complains of increased fatigue since our last visit, which has been causing her to sleep more than usual.   She complains of occasional wheezing/shortness of breath while walking. Patient contributes this symptom to cold air. She has been following up with her Cardiologist, Pulmonologist, and her PCP. She is scheduled for echocardiogram next week.   Patient has been staying physically active and has been staying well hydrated.   Patient has been started on cholesterol medication since our last visit.  She denies fever, chills, night sweats, abdominal pain, new lumps/bumps, chest pain, abnormal bowel movement, or leg swelling. She complains of chronic back pain.    MEDICAL HISTORY:  Past Medical History:  Diagnosis Date  . Abnormal uterine bleeding   . Anemia    as a teenager  . Anxiety    postpartum and hx panic attacks  . Arrhythmia   . Arthritis    back and hands  . Chest mass    by CT scanning  . Chest pain 07/26/2014  . Complication of anesthesia    slow to wake up  . COVID-19 10/2020  . Depression   . DOE (dyspnea on exertion)  07/26/2014  . Family history of adverse reaction to anesthesia    mom slow to wake up  . Gestational diabetes    resolved after pregnancy  . History of kidney stones   . Hypertension   . Personal history of colonic polyps   . PONV (postoperative nausea and vomiting)   . PREMATURE ATRIAL CONTRACTIONS 08/15/2008   Qualifier: Diagnosis of  By: Hetty Ely MD, Franne Grip   . RENAL CALCULUS, HX OF 08/15/2008   Qualifier: Diagnosis of  By: Lorrene Reid LPN, Burna Mortimer    . Rotator cuff tendinitis 06/29/2011  . Seasonal allergies   . Urinary incontinence 05/2009   hx bladder sling--Dr. Edward Jolly    SURGICAL HISTORY: Past Surgical History:  Procedure Laterality Date  . ABDOMINAL HYSTERECTOMY  05/2009   TAH/LSO  . BLADDER SUSPENSION  2010   Dr. Edward Jolly  . BREAST BIOPSY     left stereo  . CESAREAN SECTION  03/2003  . CESAREAN SECTION  2004  . CHOLECYSTECTOMY N/A 01/02/2021   Procedure: LAPAROSCOPIC CHOLECYSTECTOMY;  Surgeon: Emelia Loron, MD;  Location: [facility];  Service: General;  Laterality: N/A;  58  - GEN AND TAP BLOCK LDOW  . COLONOSCOPY  09/08  . COLPORRHAPHY  05/2009   Dr. Edward Jolly  . ENDOMETRIAL ABLATION  2006   Dr. Edward Jolly  . INCONTINENCE SURGERY     sling for urinary incontinence  . IR IMAGING GUIDED PORT INSERTION  09/01/2021  . IR REMOVAL TUN ACCESS W/ PORT W/O FL MOD SED  10/29/2022  .  KYPHOPLASTY N/A 04/07/2021   Procedure: Thoracic 12 Vertebral body biopsy;  Surgeon: Barnett AbuElsner, Henry, MD;  [facility];  Service: Neurosurgery;  Laterality: N/A;  . LAPAROSCOPIC ENDOMETRIOSIS FULGURATION     ablation  . LUMBAR LAMINECTOMY/DECOMPRESSION MICRODISCECTOMY N/A 07/31/2021   Procedure: Open Transpedicular biopsy of Thoracic twelve;  Surgeon: Barnett AbuElsner, Henry, MD;  [facility];  Service: Neurosurgery;  Laterality: N/A;  . OOPHORECTOMY  2010   w/hyst--Dr. Edward JollySilva  . TUBAL LIGATION  2004   w/ C-section Dr. Edward JollySilva  . TYMPANOPLASTY     right ear  . VAGINAL HYSTERECTOMY  05/21/2009   lap assisted  /partial    SOCIAL HISTORY: Social History   Socioeconomic History  . Marital status: Married    Spouse name: Not on file  . Number of children: 2  . Years of education: Not on file  . Highest education level: Not on file  Occupational History  . Occupation: [employer]  Tobacco Use  . Smoking status: Former    Years: 10    Types: Cigarettes    Quit date: 01/06/1999    Years since quitting: 24.0  . Smokeless tobacco: Never  Vaping Use  . Vaping Use: Never used  Substance and Sexual Activity  . Alcohol use: No    Alcohol/week: 0.0 standard drinks of alcohol  . Drug use: No  . Sexual activity: Yes    Partners: Male    Birth control/protection: Other-see comments, Surgical    Comment: TAH/LSO  Other Topics Concern  . Not on file  Social History Narrative   ** Merged History Encounter **       Social Determinants of Health   Financial Resource Strain: Not on file  Food Insecurity: Not on file  Transportation Needs: Not on file  Physical Activity: Not on file  Stress: Not on file  Social Connections: Not on file  Intimate Partner Violence: Not on file    FAMILY HISTORY: Family History  Problem Relation Age of Onset  . Kidney disease Mother        renal failure  . Stroke Mother   . Diabetes Mother   . Hypertension Mother   . Heart failure Mother   . Colon cancer Mother   . Stroke Father   . Cancer - Colon Maternal Aunt   . Hypertension Sister   . Rheum arthritis Sister   . Hypertension Brother   . Hypertension Sister   . Hypertension Sister   . Hypertension Sister   . Breast cancer Sister   . Kidney cancer Sister   . Esophageal cancer Neg Hx   . Rectal cancer Neg Hx   . Stomach cancer Neg Hx     ALLERGIES:  is allergic to buspar [buspirone hcl].  MEDICATIONS:  Current Outpatient Medications  Medication Sig Dispense Refill  . acetaminophen (TYLENOL) 500 MG tablet Take 1,000 mg by mouth every 6 (six) hours as needed for moderate pain or  headache.    Virginia Oneal. atorvastatin (LIPITOR) 20 MG tablet Take 1 tablet (20 mg total) by mouth daily. 90 tablet 3  . b complex vitamins capsule Take 1 capsule by mouth daily.    . betamethasone valerate (VALISONE) 0.1 % cream Apply topically as needed.    . cetirizine (ZYRTEC) 10 MG tablet Take 10 mg by mouth daily.     . Cholecalciferol (VITAMIN D3) 125 MCG (5000 UT) CAPS Take 5,000 Units by mouth daily.    . hydrOXYzine (ATARAX) 10 MG tablet Take 1 or 2  tablets by mouth as needed for anxiety attack. 15 tablet 0  . lidocaine-prilocaine (EMLA) cream Apply to affected area once (Patient not taking: Reported on 12/22/2022) 30 g 3  . losartan-hydrochlorothiazide (HYZAAR) 50-12.5 MG tablet Take 1 tablet by mouth daily. for blood pressure. 90 tablet 3  . Methylcobalamin (B-12) 5000 MCG TBDP Take 5,000 mcg by mouth daily.    . metoprolol tartrate (LOPRESSOR) 100 MG tablet Take 1 tablet (100 mg total) by mouth as directed. Take TWO HOURS prior to CT test 1 tablet 0  . venlafaxine XR (EFFEXOR-XR) 37.5 MG 24 hr capsule TAKE 1 CAPSULE BY MOUTH EVERY DAY WITH BREAKFAST FOR HOT FLASHES AND ANXIETY 90 capsule 2   No current facility-administered medications for this visit.    REVIEW OF SYSTEMS:   10 Point review of Systems was done is negative except as noted above.  PHYSICAL EXAMINATION: .There were no vitals taken for this visit. NAD GENERAL:alert, in no acute distress and comfortable SKIN: no acute rashes, no significant lesions EYES: conjunctiva are pink and non-injected, sclera anicteric OROPHARYNX: MMM, no exudates, no oropharyngeal erythema or ulceration NECK: supple, no JVD LYMPH:  no palpable lymphadenopathy in the cervical, axillary or inguinal regions LUNGS: clear to auscultation b/l with normal respiratory effort HEART: regular rate & rhythm ABDOMEN:  normoactive bowel sounds , non tender, not distended. Extremity: no pedal edema PSYCH: alert & oriented x 3 with fluent speech NEURO: no focal  motor/sensory deficits  LABORATORY DATA:  I have reviewed the data as listed  .    Latest Ref Rng & Units 10/16/2022    9:57 AM 07/10/2022   12:30 PM 04/10/2022    8:20 AM  CBC  WBC 4.0 - 10.5 K/uL 4.3  4.8  3.5   Hemoglobin 12.0 - 15.0 g/dL 16.1  09.6  04.5   Hematocrit 36.0 - 46.0 % 39.9  36.3  37.1   Platelets 150 - 400 K/uL 147  148  131    .    Latest Ref Rng & Units 12/22/2022   11:43 AM 10/16/2022    9:57 AM 07/10/2022   12:30 PM  CMP  Glucose 70 - 99 mg/dL 409  811  914   BUN 6 - 24 mg/dL Creatinine 0.57 - 1.00 mg/dL 7.82  9.56  2.13   Sodium 134 - 144 mmol/L 139  140  138   Potassium 3.5 - 5.2 mmol/L 4.8  4.3  3.7   Chloride 96 - 106 mmol/L 100  105  104   CO2 20 - 29 mmol/L Calcium 8.7 - 10.2 mg/dL 08.6  9.6  9.5   Total Protein 6.5 - 8.1 g/dL  6.1  6.5   Total Bilirubin 0.3 - 1.2 mg/dL  0.5  0.5   Alkaline Phos 38 - 126 U/L  94  107   AST 15 - 41 U/L  20  21   ALT 0 - 44 U/L  25  26    Lab Results  Component Value Date   LDH 162 10/16/2022    HIV Screen 4th Generation wRfx     Non Reactive Non Reactive  Hepatitis B Surface Ag     NON REACTIVE NON REACTIVE  Hep B Core Total Ab     NON REACTIVE NON REACTIVE  HCV Ab     NON REACTIVE NON REACTIVE  LDH     98 - 192 U/L 228 (  H)    SURGICAL PATHOLOGY  CASE: [id number]  PATIENT: Amarianna Bonaparte  Surgical Pathology Report   Clinical History: lesion of thoracic vertebra (cm)   FINAL MICROSCOPIC DIAGNOSIS:   A-C. BONE, T12 VERTEBRAL BODY AND MARROW, BIOPSY:  -  Non-Hodgkin B-cell lymphoma, high grade  -  See comment   COMMENT:   A-C.  The biopsies show normal hematopoeisis admixed with crush artifact  with associated marrow fibrosis and an atypical cellular infiltrate  composed of medium to large cells with irregular nuclear contours, ample  cytoplasm, variably prominent nucleoli, increased mitoses and apoptosis.  An initial panel of immunohistochemistry (cytokeratin AE1/3,  cytokeratin  7, cytokeratin 20, CD20 and CD3) confirmed the morphologic impression  that the atypical cells were lymphoid in nature.  The atypical  lymphocytes are positive for Pax-5, CD20, CD10, bcl-6 but negative for  CD30, bcl-2, mum-1 and EBV by in-situ hybridization. CD3 and CD5  highlight background T-cells.  The proliferative rate by ki-67 is  increased (50-60%).  Overall, the findings are consistent with a  non-Hodgkin B-cell lymphoma, high grade with a germinal center  phenotype.   FISH studies are pending and will be reported in an  addendum.  These findings were discussed with Dr. Danielle DessElsner on August 06, 2021.    RADIOGRAPHIC STUDIES: I have personally reviewed the radiological images as listed and agreed with the findings in the report.  ECHO 09/08/2021 1.Left ventricular ejection fraction, by estimation, is 55 to 60%. The left ventricle has normal function. The left ventricle has no regional wall motion abnormalities. There ismild left ventricular hypertrophy. Left ventricular diastolic parameters were normal.The average left ventricular global longitudinal strain is -20.4 %. The global longitudinal strain is normal. 2. Right ventricular systolic function is normal. The right ventricular size is normal. 3. Left atrial size was mildly dilated. The mitral valve is normal in structure. Trivial mitral valve regurgitation. No evidence of mitral stenosis. Moderate mitral annular calcification. 4.The aortic valve is normal in structure. Aortic valve regurgitation is not visualized. No aortic stenosis is present. 5.The inferior vena cava is normal in size with greater than 50% respiratory variability, suggesting right atrial pressure of 3 mmHg.  PET/CT scan 09/09/2022  IMPRESSION: 1. No specific findings of active malignancy. Previous low-grade activity in the T12 vertebral body has resolved. 2. Focal accentuated activity in the distal esophagus with maximum SUV 4.4, likely  physiologic, no CT correlate or significant accentuated activity on the prior exam in this region. 3. Hepatic steatosis. 4. Nonobstructive bilateral nephrolithiasis. 5. Aortic and coronary atherosclerosis.  Mitral valve calcification.   ASSESSMENT & PLAN:   57 year old female with  1) Stage IVA high-grade large B cell non-Hodgkin's lymphoma.  Germinal center phenotype.  C-Myc not rearranged.  Ki-67 50 to 60% No overt constitutional symptoms PET/CT with involvement of thoracic spine and acromion process on the right.  Abdominal lymphadenopathy.  Hepatitis testing unremarkable HIV nonreactive Myeloma panel shows no M spike protein spike.  Echocardiogram shows normal ejection fraction  PET CT scan done on 09/02/2021 shows Scattered mild hypermetabolism within subcentimeter periportal, retroperitoneal and mesenteric lymph nodes, compatible with the given history of large B-cell lymphoma. 2. Hypermetabolism within known T7 and T12 lesions, better seen on MR thoracic spine 07/04/2021. 3. Focal hypermetabolism in the acromion of the right scapula, without a definite CT correlate. Recommend attention on follow-up.  2) Chemotherapy related neutropenia -now resolved.  No issues with infections.    3)h/o  G-CSF related significant bone pain.  PLAN -  discussed lab results from today, 01/18/2023, with the patient. CBC and CMP are stable. LDH pending.  -No indication of lymphoma.  -Answered all of patient's questions.  -Continue to follow-up with PCP and cardiologist.   FOLLOW-UP: RTC with Dr Candise CheKale with labs in 4 months  The total time spent in the appointment was *** minutes* .  All of the patient's questions were answered with apparent satisfaction. The patient knows to call the clinic with any problems, questions or concerns.   Wyvonnia LoraGautam Davidson Palmieri MD MS AAHIVMS [facility] Hematology/Oncology Physician [facility]  .*Total Encounter Time as defined by the Centers for Medicare and  Medicaid Services includes, in addition to the face-to-face time of a patient visit (documented in the note above) non-face-to-face time: obtaining and reviewing outside history, ordering and reviewing medications, tests or procedures, care coordination (communications with other health care professionals or caregivers) and documentation in the medical record.   Colonel BaldI, Param Shah, am acting as a Neurosurgeonscribe for Wyvonnia LoraGautam Eliska Hamil, MD.

## 2023-03-18 ENCOUNTER — Telehealth: Payer: Self-pay | Admitting: Cardiology

## 2023-03-18 MED ORDER — ATORVASTATIN CALCIUM 20 MG PO TABS: 20.0000 mg | ORAL_TABLET | Freq: Every day | ORAL | 3 refills | Status: DC

## 2023-03-18 NOTE — Telephone Encounter (Signed)
Pt's medication was sent to pt's pharmacy as requested. Confirmation received.  °

## 2023-03-18 NOTE — Telephone Encounter (Signed)
 *  STAT* If patient is at the pharmacy, call can be transferred to refill team.   1. Which medications need to be refilled? (please list name of each medication and dose if known) atorvastatin (LIPITOR) 20 MG tablet   2. Which pharmacy/location (including street and city if local pharmacy) is medication to be sent to? [id number] - [place], [place] - [address]    3. Do they need a 30 day or 90 day supply? 90 days

## 2023-03-22 ENCOUNTER — Telehealth: Payer: Self-pay | Admitting: Cardiology

## 2023-03-22 NOTE — Telephone Encounter (Signed)
New Message:    Patient said she had work this morning and the Tech drew extra blood. She says she have ben feeling so tired. She would like for Dr Virginia Oneal to please put in an order to have have her iron checked please.

## 2023-03-22 NOTE — Telephone Encounter (Signed)
Patient states she feels like since starting atorvastatin she has been tired and have not had any energy and aching bones. She would like to check her iron levels. Is it ok to place order.

## 2023-03-23 LAB — LIPID PANEL
Chol/HDL Ratio: 2.4 ratio (ref 0.0–4.4)
Cholesterol, Total: 141 mg/dL (ref 100–199)
HDL: 60 mg/dL (ref >39)
LDL Chol Calc (NIH): 66 mg/dL (ref 0–99)
Triglycerides: 80 mg/dL (ref 0–149)
VLDL Cholesterol Cal: 15 mg/dL (ref 5–40)

## 2023-03-24 NOTE — Telephone Encounter (Signed)
Spoke to patient she stated she has no energy.She wants to sleep all the time.Stated her bones are achy.She thinks Atorvastatin is causing.Advised to hold Atorvastatin until her appointment with Dr.Schumann 6/25 at 10:40 am.I will make Dr.Schumann aware.

## 2023-03-24 NOTE — Telephone Encounter (Signed)
Agree with holding atorvastatin

## 2023-03-25 NOTE — Telephone Encounter (Signed)
Called patient left message on personal voice mail Dr.Schumann agreed ok to hold Atorvastatin until appointment 6/25 at 10:40 am.

## 2023-04-04 NOTE — Progress Notes (Unsigned)
Cardiology Office Note:    Date:  04/06/2023   ID:  Virginia Oneal, DOB 12/02/1965, MRN [id number]  PCP:  Doreene Nest, NP  Cardiologist:  None  Electrophysiologist:  None   Referring MD: Doreene Nest, NP   Chief Complaint  Patient presents with   Chest Pain    History of Present Illness:    Virginia Oneal is a 57 y.o. female with a hx of hypertension, T2DM, large B-cell lymphoma s/p chemoradiation who presents for follow-up.  She was initially seen on 12/22/2022.  She reports she recently started walking and had chest pain.  Describes as tightness on left side of chest.  Also felt short of breath.  Happened 1-2 times when she tried walking.  Also reports shortness of breath with exertion.  She denies any lightheadedness, syncope, or lower extremity edema.  Does report some palpitations when she is exerting herself.  She quit smoking in 2000, smoked about 0.5 to 1 pack/day x 7 years.  Family history includes mother had MI at 17.  Echocardiogram 09/08/2021 showed normal biventricular function, no significant valvular disease.  Coronary CTA on 01/04/2023 showed nonobstructive CAD with noncalcified plaque in mid RCA causing 25 to 49% stenosis and calcified plaque in left main causing less than 25% stenosis, calcium score 15 (78th percentile).  Echocardiogram 01/20/2023 showed normal biventricular function, grade 1 diastolic dysfunction, moderate mitral annular calcification, no significant valvular disease.  Since last clinic visit, she reports she is doing okay.  Still reports chest pain, occurs when she goes for walks and resolves with rest.  Reports she did not tolerate statin, was causing myalgias and brain fog.  Past Medical History:  Diagnosis Date   Abnormal uterine bleeding    Anemia    as a teenager   Anxiety    postpartum and hx panic attacks   Arrhythmia    Arthritis    back and hands   Chest mass    by CT scanning   Chest pain 07/26/2014   Complication of  anesthesia    slow to wake up   COVID-19 10/2020   Depression    DOE (dyspnea on exertion) 07/26/2014   Family history of adverse reaction to anesthesia    mom slow to wake up   Gestational diabetes    resolved after pregnancy   History of kidney stones    Hypertension    Personal history of colonic polyps    PONV (postoperative nausea and vomiting)    PREMATURE ATRIAL CONTRACTIONS 08/15/2008   Qualifier: Diagnosis of  By: Hetty Ely MD, Franne Grip    RENAL CALCULUS, HX OF 08/15/2008   Qualifier: Diagnosis of  By: Lorrene Reid LPN, Wanda     Rotator cuff tendinitis 06/29/2011   Seasonal allergies    Urinary incontinence 05/2009   hx bladder sling--Dr. Edward Jolly    Past Surgical History:  Procedure Laterality Date   ABDOMINAL HYSTERECTOMY  05/2009   TAH/LSO   BLADDER SUSPENSION  2010   Dr. Edward Jolly   BREAST BIOPSY     left stereo   CESAREAN SECTION  03/2003   CESAREAN SECTION  2004   CHOLECYSTECTOMY N/A 01/02/2021   Procedure: LAPAROSCOPIC CHOLECYSTECTOMY;  Surgeon: Emelia Loron, MD;  Location: [facility];  Service: General;  Laterality: N/A;  58  - GEN AND TAP BLOCK LDOW   COLONOSCOPY  09/08   COLPORRHAPHY  05/2009   Dr. Edward Jolly   ENDOMETRIAL ABLATION  2006   Dr. Edward Jolly   INCONTINENCE SURGERY  sling for urinary incontinence   IR IMAGING GUIDED PORT INSERTION  09/01/2021   IR REMOVAL TUN ACCESS W/ PORT W/O FL MOD SED  10/29/2022   KYPHOPLASTY N/A 04/07/2021   Procedure: Thoracic 12 Vertebral body biopsy;  Surgeon: Barnett Abu, MD;  Location: [facility];  Service: Neurosurgery;  Laterality: N/A;   LAPAROSCOPIC ENDOMETRIOSIS FULGURATION     ablation   LUMBAR LAMINECTOMY/DECOMPRESSION MICRODISCECTOMY N/A 07/31/2021   Procedure: Open Transpedicular biopsy of Thoracic twelve;  Surgeon: Barnett Abu, MD;  Location: [facility] OR;  Service: Neurosurgery;  Laterality: N/A;   OOPHORECTOMY  2010   w/hyst--Dr. Edward Jolly   TUBAL LIGATION  2004   w/ C-section Dr. Edward Jolly   TYMPANOPLASTY     right ear   VAGINAL  HYSTERECTOMY  05/21/2009   lap assisted /partial    Current Medications: Current Meds  Medication Sig   acetaminophen (TYLENOL) 500 MG tablet Take 1,000 mg by mouth every 6 (six) hours as needed for moderate pain or headache.   aspirin EC 81 MG tablet Take 1 tablet (81 mg total) by mouth daily. Swallow whole.   b complex vitamins capsule Take 1 capsule by mouth daily.   betamethasone valerate (VALISONE) 0.1 % cream Apply topically as needed.   cetirizine (ZYRTEC) 10 MG tablet Take 10 mg by mouth daily.    Cholecalciferol (VITAMIN D3) 125 MCG (5000 UT) CAPS Take 5,000 Units by mouth daily.   hydrOXYzine (ATARAX) 10 MG tablet Take 1 or 2 tablets by mouth as needed for anxiety attack.   losartan-hydrochlorothiazide (HYZAAR) 50-12.5 MG tablet Take 1 tablet by mouth daily. for blood pressure.   Methylcobalamin (B-12) 5000 MCG TBDP Take 5,000 mcg by mouth daily.   metoprolol succinate (TOPROL XL) 25 MG 24 hr tablet Take 1 tablet (25 mg total) by mouth daily.   rosuvastatin (CRESTOR) 5 MG tablet Take 1 tablet (5 mg total) by mouth every evening.   venlafaxine XR (EFFEXOR-XR) 37.5 MG 24 hr capsule TAKE 1 CAPSULE BY MOUTH EVERY DAY WITH BREAKFAST FOR HOT FLASHES AND ANXIETY     Allergies:   Buspar [buspirone hcl]   Social History   Socioeconomic History   Marital status: Married    Spouse name: Not on file   Number of children: 2   Years of education: Not on file   Highest education level: Not on file  Occupational History   Occupation: [employer]  Tobacco Use   Smoking status: Former    Years: 10    Types: Cigarettes    Quit date: 01/06/1999    Years since quitting: 24.2   Smokeless tobacco: Never  Vaping Use   Vaping Use: Never used  Substance and Sexual Activity   Alcohol use: No    Alcohol/week: 0.0 standard drinks of alcohol   Drug use: No   Sexual activity: Yes    Partners: Male    Birth control/protection: Other-see comments, Surgical    Comment: TAH/LSO  Other  Topics Concern   Not on file  Social History Narrative   ** Merged History Encounter **       Social Determinants of Health   Financial Resource Strain: Not on file  Food Insecurity: Not on file  Transportation Needs: Not on file  Physical Activity: Not on file  Stress: Not on file  Social Connections: Not on file     Family History: The patient's family history includes Breast cancer in her sister; Cancer - Colon in her maternal aunt; Colon cancer in her  mother; Diabetes in her mother; Heart failure in her mother; Hypertension in her brother, mother, sister, sister, sister, and sister; Kidney cancer in her sister; Kidney disease in her mother; Rheum arthritis in her sister; Stroke in her father and mother. There is no history of Esophageal cancer, Rectal cancer, or Stomach cancer.  ROS:   Please see the history of present illness.     All other systems reviewed and are negative.  EKGs/Labs/Other Studies Reviewed:    The following studies were reviewed today:   EKG:   12/22/2022: Normal sinus rhythm, rate 94, no ST abnormalities  Recent Labs: 12/09/2022: Pro B Natriuretic peptide (BNP) 17.0 01/18/2023: ALT 27; BUN 18; Creatinine 0.75; Hemoglobin 13.9; Platelet Count 150; Potassium 4.0; Sodium 141  Recent Lipid Panel    Component Value Date/Time   CHOL 141 03/22/2023 1035   TRIG 80 03/22/2023 1035   HDL 60 03/22/2023 1035   CHOLHDL 2.4 03/22/2023 1035   CHOLHDL 3 12/09/2022 1032   VLDL 18.6 12/09/2022 1032   LDLCALC 66 03/22/2023 1035    Physical Exam:    VS:  BP 110/76   Pulse 90   Ht 5\' 4"  (1.626 m)   Wt 252 lb 9.6 oz (114.6 kg)   SpO2 96%   BMI 43.36 kg/m     Wt Readings from Last 3 Encounters:  04/06/23 252 lb 9.6 oz (114.6 kg)  01/18/23 252 lb 12.8 oz (114.7 kg)  12/22/22 252 lb 9.6 oz (114.6 kg)     GEN:  Well nourished, well developed in no acute distress HEENT: Normal NECK: No JVD; No carotid bruits LYMPHATICS: No lymphadenopathy CARDIAC: RRR, no  murmurs, rubs, gallops RESPIRATORY:  Clear to auscultation without rales, wheezing or rhonchi  ABDOMEN: Soft, non-tender, non-distended MUSCULOSKELETAL:  No edema; No deformity  SKIN: Warm and dry NEUROLOGIC:  Alert and oriented x 3 PSYCHIATRIC:  Normal affect   ASSESSMENT:    1. Coronary artery disease involving native coronary artery of native heart, unspecified whether angina present   2. Chest pain, unspecified type   3. Hyperlipidemia, unspecified hyperlipidemia type   4. Essential hypertension   5. Daytime somnolence     PLAN:    CAD: Description of chest pain concerning for angina, as describes exertional chest tightness.  Coronary CTA on 01/04/2023 showed nonobstructive CAD with noncalcified plaque in mid RCA causing 25 to 49% stenosis and calcified plaque in left main causing less than 25% stenosis, calcium score 15 (78th percentile).  Echocardiogram 01/20/2023 showed normal biventricular function, grade 1 diastolic dysfunction, moderate mitral annular calcification, no significant valvular disease. -Given description of chest pain suggests typical angina with nonobstructive disease on coronary CTA, suspect microvascular disease.  Will trial Toprol XL 25 mg daily. -Developed myalgias with atorvastatin, switched to rosuvastatin 5 mg daily -Add aspirin 81 mg daily  Hypertension: On losartan-HCTZ 50-12.5 mg daily.  Adding Toprol-XL as above  Hyperlipidemia: LDL 105 on 12/01/2022.  Started atorvastatin 20 mg daily with improvement in LDL to 66 03/2023.  Developed myalgias and brain fog with atorvastatin, was discontinued.  Will switch to rosuvastatin 5 mg daily.  Check fasting lipid panel in 2 months  Large B-cell lymphoma: Status post chemoradiation.  In remission.  Daytime somnolence: Check [facility] sleep study.  STOP-BANG 5   RTC in 4 months   Medication Adjustments/Labs and Tests Ordered: Current medicines are reviewed at length with the patient today.  Concerns regarding  medicines are outlined above.  Orders Placed This Encounter  Procedures  Lipid panel   [facility] Sleep Study   Meds ordered this encounter  Medications   rosuvastatin (CRESTOR) 5 MG tablet    Sig: Take 1 tablet (5 mg total) by mouth every evening.    Dispense:  90 tablet    Refill:  3   metoprolol succinate (TOPROL XL) 25 MG 24 hr tablet    Sig: Take 1 tablet (25 mg total) by mouth daily.    Dispense:  90 tablet    Refill:  3   aspirin EC 81 MG tablet    Sig: Take 1 tablet (81 mg total) by mouth daily. Swallow whole.    Dispense:  90 tablet    Refill:  3    Patient Instructions  Medication Instructions:  Start Aspirin 81 mg daily Start Rosuvastatin 5 mg every evening Wait one week, THEN START Toprol XL (Metoprolol Succinate) 25 mg daily *If you need a refill on your cardiac medications before your next appointment, please call your pharmacy*   Lab Work: Lipid Panel- Please return for Blood Work in 2 months. No appointment needed, lab here at the office is open Monday-Friday from 8AM to 4PM. If you have labs (blood work) drawn today and your tests are completely normal, you will receive your results only by: MyChart Message (if you have MyChart) OR A paper copy in the mail If you have any lab test that is abnormal or we need to change your treatment, we will call you to review the results.   Testing/Procedures: WatchPAT?  Is a FDA cleared portable home sleep study test that uses a watch and 3 points of contact to monitor 7 different channels, including your heart rate, oxygen saturations, body position, snoring, and chest motion.  The study is easy to use from the comfort of your own home and accurately detect sleep apnea.  Before bed, you attach the chest sensor, attached the sleep apnea bracelet to your nondominant hand, and attach the finger probe.  After the study, the raw data is downloaded from the watch and scored for apnea events.   For more information:  [email or web address]  Patient Testing Instructions:  Do not put battery into the device until bedtime when you are ready to begin the test. Please call the support number if you need assistance after following the instructions below: 24 hour support line- [phone] or ITAMAR support at [phone] (option 2)  Download the IntelWatchPAT One" app through the google play store or App Store  Be sure to turn on or enable access to bluetooth in settlings on your smartphone/ device  Make sure no other bluetooth devices are on and within the vicinity of your smartphone/ device and WatchPAT watch during testing.  Make sure to leave your smart phone/ device plugged in and charging all night.  When ready for bed:  Follow the instructions step by step in the WatchPAT One App to activate the testing device. For additional instructions, including video instruction, visit the WatchPAT One video on Youtube. You can search for WatchPat One within Youtube (video is 4 minutes and 18 seconds) or enter: https://youtube/watch?[email or web address]_vbiwxE Please note: You will be prompted to enter a Pin to connect via bluetooth when starting the test. The PIN will be assigned to you when you receive the test.  The device is disposable, but it recommended that you retain the device until you receive a call letting you know the study has been received and the results have been interpreted.  We will let you know  if the study did not transmit to [name] properly after the test is completed. You do not need to call us to confirm the receipt of the test.  Please complete the test within 48 hours of receiving PIN.   Frequently Asked Questions:  What is Watch Dennie Bible one?  A single use fully disposable home sleep apnea testing device and will not need to be returned after completion.  What are the requirements to use WatchPAT one?  The be able to have a successful watchpat one sleep study, you should have your Watch pat one  device, your smart phone, watch pat one app, your PIN number and Internet access What type of phone do I need?  You should have a smart phone that uses Android 5.1 and above or any Iphone with IOS 10 and above How can I download the WatchPAT one app?  Based on your device type search for WatchPAT one app either in google play for android devices or APP store for Iphone's Where will I get my PIN for the study?  Your PIN will be provided by your physician's office. It is used for authentication and if you lose/forget your PIN, please reach out to your providers office.  I do not have Internet at home. Can I do WatchPAT one study?  WatchPAT One needs Internet connection throughout the night to be able to transmit the sleep data. You can use your home/local internet or your cellular's data package. However, it is always recommended to use home/local Internet. It is estimated that between 20MB-30MB will be used with each study.However, the application will be looking for space in the phone to start the study.  What happens if I lose internet or bluetooth connection?  During the internet disconnection, your phone will not be able to transmit the sleep data. All the data, will be stored in your phone. As soon as the internet connection is back on, the phone will being sending the sleep data. During the bluetooth disconnection, WatchPAT one will not be able to to send the sleep data to your phone. Data will be kept in the [facility] one until two devices have bluetooth connection back on. As soon as the connection is back on, WatchPAT one will send the sleep data to the phone.  How long do I need to wear the WatchPAT one?  After you start the study, you should wear the device at least 6 hours.  How far should I keep my phone from the device?  During the night, your phone should be within 15 feet.  What happens if I leave the room for restroom or other reasons?  Leaving the room for any reason will not  cause any problem. As soon as your get back to the room, both devices will reconnect and will continue to send the sleep data. Can I use my phone during the sleep study?  Yes, you can use your phone as usual during the study. But it is recommended to put your watchpat one on when you are ready to go to bed.  How will I get my study results?  A soon as you completed your study, your sleep data will be sent to the provider. They will then share the results with you when they are ready.     Follow-Up: At [facility], you and your health needs are our priority.  As part of our continuing mission to provide you with exceptional heart care, we have created designated Provider Care  Teams.  These Care Teams include your primary Cardiologist (physician) and Advanced Practice Providers (APPs -  Physician Assistants and Nurse Practitioners) who all work together to provide you with the care you need, when you need it.  We recommend signing up for the patient portal called "MyChart".  Sign up information is provided on this After Visit Summary.  MyChart is used to connect with patients for Virtual Visits (Telemedicine).  Patients are able to view lab/test results, encounter notes, upcoming appointments, etc.  Non-urgent messages can be sent to your provider as well.   To learn more about what you can do with MyChart, go to [email or web address].    Your next appointment:   4 month(s)  Provider:   Dr Bjorn Pippin    Signed, Little Ishikawa, MD  04/06/2023 12:48 PM    [facility]

## 2023-04-06 ENCOUNTER — Ambulatory Visit: Payer: 59 | Attending: Cardiology | Admitting: Cardiology

## 2023-04-06 VITALS — BP 110/76 | HR 90 | Ht 64.0 in | Wt 252.6 lb

## 2023-04-06 DIAGNOSIS — E785 Hyperlipidemia, unspecified: Secondary | ICD-10-CM | POA: Diagnosis not present

## 2023-04-06 DIAGNOSIS — I10 Essential (primary) hypertension: Secondary | ICD-10-CM

## 2023-04-06 DIAGNOSIS — I2510 Atherosclerotic heart disease of native coronary artery without angina pectoris: Secondary | ICD-10-CM

## 2023-04-06 DIAGNOSIS — R079 Chest pain, unspecified: Secondary | ICD-10-CM

## 2023-04-06 DIAGNOSIS — R400 Somnolence: Secondary | ICD-10-CM

## 2023-04-06 MED ORDER — METOPROLOL SUCCINATE ER 25 MG PO TB24: 25.0000 mg | ORAL_TABLET | Freq: Every day | ORAL | 3 refills | Status: DC

## 2023-04-06 MED ORDER — ROSUVASTATIN CALCIUM 5 MG PO TABS: 5.0000 mg | ORAL_TABLET | Freq: Every evening | ORAL | 3 refills | Status: DC

## 2023-04-06 MED ORDER — ASPIRIN 81 MG PO TBEC: 81.0000 mg | DELAYED_RELEASE_TABLET | Freq: Every day | ORAL | 3 refills | Status: DC

## 2023-04-06 NOTE — Patient Instructions (Signed)
Medication Instructions:  Start Aspirin 81 mg daily Start Rosuvastatin 5 mg every evening Wait one week, THEN START Toprol XL (Metoprolol Succinate) 25 mg daily *If you need a refill on your cardiac medications before your next appointment, please call your pharmacy*   Lab Work: Lipid Panel- Please return for Blood Work in 2 months. No appointment needed, lab here at the office is open Monday-Friday from 8AM to 4PM. If you have labs (blood work) drawn today and your tests are completely normal, you will receive your results only by: MyChart Message (if you have MyChart) OR A paper copy in the mail If you have any lab test that is abnormal or we need to change your treatment, we will call you to review the results.   Testing/Procedures: WatchPAT?  Is a FDA cleared portable home sleep study test that uses a watch and 3 points of contact to monitor 7 different channels, including your heart rate, oxygen saturations, body position, snoring, and chest motion.  The study is easy to use from the comfort of your own home and accurately detect sleep apnea.  Before bed, you attach the chest sensor, attached the sleep apnea bracelet to your nondominant hand, and attach the finger probe.  After the study, the raw data is downloaded from the watch and scored for apnea events.   For more information: [email or web address]  Patient Testing Instructions:  Do not put battery into the device until bedtime when you are ready to begin the test. Please call the support number if you need assistance after following the instructions below: 24 hour support line- [phone] or ITAMAR support at [phone] (option 2)  Download the IntelWatchPAT One" app through the google play store or App Store  Be sure to turn on or enable access to bluetooth in settlings on your smartphone/ device  Make sure no other bluetooth devices are on and within the vicinity of your smartphone/ device and WatchPAT  watch during testing.  Make sure to leave your smart phone/ device plugged in and charging all night.  When ready for bed:  Follow the instructions step by step in the WatchPAT One App to activate the testing device. For additional instructions, including video instruction, visit the WatchPAT One video on Youtube. You can search for WatchPat One within Youtube (video is 4 minutes and 18 seconds) or enter: https://youtube/watch?[email or web address]_vbiwxE Please note: You will be prompted to enter a Pin to connect via bluetooth when starting the test. The PIN will be assigned to you when you receive the test.  The device is disposable, but it recommended that you retain the device until you receive a call letting you know the study has been received and the results have been interpreted.  We will let you know if the study did not transmit to [name] properly after the test is completed. You do not need to call us to confirm the receipt of the test.  Please complete the test within 48 hours of receiving PIN.   Frequently Asked Questions:  What is Watch Dennie Bible one?  A single use fully disposable home sleep apnea testing device and will not need to be returned after completion.  What are the requirements to use WatchPAT one?  The be able to have a successful watchpat one sleep study, you should have your Watch pat one device, your smart phone, watch pat one app, your PIN number and Internet access What type of phone do I need?  You should have a smart phone that  uses Android 5.1 and above or any Iphone with IOS 10 and above How can I download the WatchPAT one app?  Based on your device type search for WatchPAT one app either in google play for android devices or APP store for Iphone's Where will I get my PIN for the study?  Your PIN will be provided by your physician's office. It is used for authentication and if you lose/forget your PIN, please reach out to your providers office.  I do not have Internet at home. Can I do  WatchPAT one study?  WatchPAT One needs Internet connection throughout the night to be able to transmit the sleep data. You can use your home/local internet or your cellular's data package. However, it is always recommended to use home/local Internet. It is estimated that between 20MB-30MB will be used with each study.However, the application will be looking for space in the phone to start the study.  What happens if I lose internet or bluetooth connection?  During the internet disconnection, your phone will not be able to transmit the sleep data. All the data, will be stored in your phone. As soon as the internet connection is back on, the phone will being sending the sleep data. During the bluetooth disconnection, WatchPAT one will not be able to to send the sleep data to your phone. Data will be kept in the [facility] one until two devices have bluetooth connection back on. As soon as the connection is back on, WatchPAT one will send the sleep data to the phone.  How long do I need to wear the WatchPAT one?  After you start the study, you should wear the device at least 6 hours.  How far should I keep my phone from the device?  During the night, your phone should be within 15 feet.  What happens if I leave the room for restroom or other reasons?  Leaving the room for any reason will not cause any problem. As soon as your get back to the room, both devices will reconnect and will continue to send the sleep data. Can I use my phone during the sleep study?  Yes, you can use your phone as usual during the study. But it is recommended to put your watchpat one on when you are ready to go to bed.  How will I get my study results?  A soon as you completed your study, your sleep data will be sent to the provider. They will then share the results with you when they are ready.     Follow-Up: At [facility], you and your health needs are our priority.  As part of our continuing mission to  provide you with exceptional heart care, we have created designated Provider Care Teams.  These Care Teams include your primary Cardiologist (physician) and Advanced Practice Providers (APPs -  Physician Assistants and Nurse Practitioners) who all work together to provide you with the care you need, when you need it.  We recommend signing up for the patient portal called "MyChart".  Sign up information is provided on this After Visit Summary.  MyChart is used to connect with patients for Virtual Visits (Telemedicine).  Patients are able to view lab/test results, encounter notes, upcoming appointments, etc.  Non-urgent messages can be sent to your provider as well.   To learn more about what you can do with MyChart, go to [email or web address].    Your next appointment:   4 month(s)  Provider:   Dr Bjorn Pippin

## 2023-04-08 ENCOUNTER — Telehealth: Payer: Self-pay | Admitting: Hematology

## 2023-04-19 ENCOUNTER — Other Ambulatory Visit: Payer: Self-pay | Admitting: Primary Care

## 2023-04-19 DIAGNOSIS — F419 Anxiety disorder, unspecified: Secondary | ICD-10-CM

## 2023-04-19 DIAGNOSIS — R232 Flushing: Secondary | ICD-10-CM

## 2023-04-19 NOTE — Telephone Encounter (Signed)
Patient is due for CPE/follow up in mid August. Please schedule, thank you!   

## 2023-04-19 NOTE — Telephone Encounter (Signed)
Patient has been scheduled

## 2023-04-19 NOTE — Telephone Encounter (Signed)
LVMTCB and sched

## 2023-04-21 ENCOUNTER — Other Ambulatory Visit: Payer: Self-pay

## 2023-04-21 DIAGNOSIS — C8330 Diffuse large B-cell lymphoma, unspecified site: Secondary | ICD-10-CM

## 2023-04-22 ENCOUNTER — Other Ambulatory Visit: Payer: Self-pay

## 2023-04-22 ENCOUNTER — Inpatient Hospital Stay: Payer: 59 | Attending: Hematology

## 2023-04-22 ENCOUNTER — Inpatient Hospital Stay (HOSPITAL_BASED_OUTPATIENT_CLINIC_OR_DEPARTMENT_OTHER): Payer: 59 | Admitting: Hematology

## 2023-04-22 VITALS — BP 123/83 | HR 82 | Temp 97.2°F | Resp 20 | Wt 256.9 lb

## 2023-04-22 DIAGNOSIS — Z79899 Other long term (current) drug therapy: Secondary | ICD-10-CM | POA: Diagnosis not present

## 2023-04-22 DIAGNOSIS — C8512 Unspecified B-cell lymphoma, intrathoracic lymph nodes: Secondary | ICD-10-CM | POA: Insufficient documentation

## 2023-04-22 DIAGNOSIS — R079 Chest pain, unspecified: Secondary | ICD-10-CM | POA: Insufficient documentation

## 2023-04-22 DIAGNOSIS — C8330 Diffuse large B-cell lymphoma, unspecified site: Secondary | ICD-10-CM

## 2023-04-22 DIAGNOSIS — M129 Arthropathy, unspecified: Secondary | ICD-10-CM | POA: Insufficient documentation

## 2023-04-22 DIAGNOSIS — D649 Anemia, unspecified: Secondary | ICD-10-CM | POA: Insufficient documentation

## 2023-04-22 DIAGNOSIS — Z7982 Long term (current) use of aspirin: Secondary | ICD-10-CM | POA: Insufficient documentation

## 2023-04-22 LAB — CMP (CANCER CENTER ONLY)
ALT: 25 U/L (ref 0–44)
AST: 20 U/L (ref 15–41)
Albumin: 3.9 g/dL (ref 3.5–5.0)
Alkaline Phosphatase: 96 U/L (ref 38–126)
Anion gap: 5 (ref 5–15)
BUN: 15 mg/dL (ref 6–20)
CO2: 31 mmol/L (ref 22–32)
Calcium: 9.8 mg/dL (ref 8.9–10.3)
Chloride: 105 mmol/L (ref 98–111)
Creatinine: 0.78 mg/dL (ref 0.44–1.00)
GFR, Estimated: >60 mL/min (ref >60)
Glucose, Bld: 109 mg/dL — ABNORMAL HIGH (ref 70–99)
Potassium: 4.7 mmol/L (ref 3.5–5.1)
Sodium: 141 mmol/L (ref 135–145)
Total Bilirubin: 0.4 mg/dL (ref 0.3–1.2)
Total Protein: 6.6 g/dL (ref 6.5–8.1)

## 2023-04-22 LAB — CBC WITH DIFFERENTIAL (CANCER CENTER ONLY)
Abs Immature Granulocytes: 0.02 10*3/uL (ref 0.00–0.07)
Basophils Absolute: 0 10*3/uL (ref 0.0–0.1)
Basophils Relative: 1 %
Eosinophils Absolute: 0.2 10*3/uL (ref 0.0–0.5)
Eosinophils Relative: 3 %
HCT: 39.5 % (ref 36.0–46.0)
Hemoglobin: 13.1 g/dL (ref 12.0–15.0)
Immature Granulocytes: 0 %
Lymphocytes Relative: 24 %
Lymphs Abs: 1.5 10*3/uL (ref 0.7–4.0)
MCH: 31 pg (ref 26.0–34.0)
MCHC: 33.2 g/dL (ref 30.0–36.0)
MCV: 93.6 fL (ref 80.0–100.0)
Monocytes Absolute: 0.6 10*3/uL (ref 0.1–1.0)
Monocytes Relative: 10 %
Neutro Abs: 3.8 10*3/uL (ref 1.7–7.7)
Neutrophils Relative %: 62 %
Platelet Count: 144 10*3/uL — ABNORMAL LOW (ref 150–400)
RBC: 4.22 MIL/uL (ref 3.87–5.11)
RDW: 12.4 % (ref 11.5–15.5)
WBC Count: 6.1 10*3/uL (ref 4.0–10.5)
nRBC: 0 % (ref 0.0–0.2)

## 2023-04-22 LAB — LACTATE DEHYDROGENASE: LDH: 190 U/L (ref 98–192)

## 2023-04-22 NOTE — Progress Notes (Signed)
HEMATOLOGY/ONCOLOGY CLINIC NOTE  Date of Service: 04/22/23  Patient Care Team: Doreene Nest, NP as PCP - General (Internal Medicine) Barnett Abu, MD as Consulting Physician (Neurosurgery) Emelia Loron, MD as Consulting Physician (General Surgery)  CHIEF COMPLAINTS/PURPOSE OF CONSULTATION:  Follow-up for continued evaluation and management of large B-cell lymphoma   HISTORY OF PRESENTING ILLNESS:  Please see previous note for details of initial presentation  INTERVAL HISTORY:  Virginia Oneal is a 57 y.o. female is here for follow-up of her diffuse large B-cell lymphoma.  Patient was last seen by me on 01/18/2023 and complained of increased fatigue, occasional wheezing/SOB while walking, and chronic back pain.   Today, she complains of SOB when walking. Cardiology ordered CT angio and findings showed no blockage and normal heart pumping function. During her episodes, her HR has been as elevated as 188. She was started on Metoprolol and she denies endorsing an elevated HR while taking it, but denies noticing any major differences.   She continues to stay active by walking 5-6x week. She denies any fevers, chills, night sweats, new lumps/bumps, skin rashes, back pain, bone pain, leg swelling, or abdominal pain/distention. She complains of stable lower back pain.   She was recently started on Crestor after her previous cholesterol medication caused frequent fatigue.  Patient continues to endorse hot flashes and continues to take Venlafaxine.   MEDICAL HISTORY:  Past Medical History:  Diagnosis Date   Abnormal uterine bleeding    Anemia    as a teenager   Anxiety    postpartum and hx panic attacks   Arrhythmia    Arthritis    back and hands   Chest mass    by CT scanning   Chest pain 07/26/2014   Complication of anesthesia    slow to wake up   COVID-19 10/2020   Depression    DOE (dyspnea on exertion) 07/26/2014   Family history of adverse reaction to  anesthesia    mom slow to wake up   Gestational diabetes    resolved after pregnancy   History of kidney stones    Hypertension    Personal history of colonic polyps    PONV (postoperative nausea and vomiting)    PREMATURE ATRIAL CONTRACTIONS 08/15/2008   Qualifier: Diagnosis of  By: Hetty Ely MD, Franne Grip    RENAL CALCULUS, HX OF 08/15/2008   Qualifier: Diagnosis of  By: Lorrene Reid LPN, Wanda     Rotator cuff tendinitis 06/29/2011   Seasonal allergies    Urinary incontinence 05/2009   hx bladder sling--Dr. Edward Jolly    SURGICAL HISTORY: Past Surgical History:  Procedure Laterality Date   ABDOMINAL HYSTERECTOMY  05/2009   TAH/LSO   BLADDER SUSPENSION  2010   Dr. Edward Jolly   BREAST BIOPSY     left stereo   CESAREAN SECTION  03/2003   CESAREAN SECTION  2004   CHOLECYSTECTOMY N/A 01/02/2021   Procedure: LAPAROSCOPIC CHOLECYSTECTOMY;  Surgeon: Emelia Loron, MD;  Location: [facility]S;  Service: General;  Laterality: N/A;  58  - GEN AND TAP BLOCK LDOW   COLONOSCOPY  09/08   COLPORRHAPHY  05/2009   Dr. Edward Jolly   ENDOMETRIAL ABLATION  2006   Dr. Edward Jolly   INCONTINENCE SURGERY     sling for urinary incontinence   IR IMAGING GUIDED PORT INSERTION  09/01/2021   IR REMOVAL TUN ACCESS W/ PORT W/O FL MOD SED  10/29/2022   KYPHOPLASTY N/A 04/07/2021   Procedure: Thoracic 12 Vertebral body  biopsy;  Surgeon: Barnett Abu, MD;  Location: [facility];  Service: Neurosurgery;  Laterality: N/A;   LAPAROSCOPIC ENDOMETRIOSIS FULGURATION     ablation   LUMBAR LAMINECTOMY/DECOMPRESSION MICRODISCECTOMY N/A 07/31/2021   Procedure: Open Transpedicular biopsy of Thoracic twelve;  Surgeon: Barnett Abu, MD;  Location: [facility] OR;  Service: Neurosurgery;  Laterality: N/A;   OOPHORECTOMY  2010   w/hyst--Dr. Edward Jolly   TUBAL LIGATION  2004   w/ C-section Dr. Edward Jolly   TYMPANOPLASTY     right ear   VAGINAL HYSTERECTOMY  05/21/2009   lap assisted /partial    SOCIAL HISTORY: Social History   Socioeconomic History   Marital  status: Married    Spouse name: Not on file   Number of children: 2   Years of education: Not on file   Highest education level: Not on file  Occupational History   Occupation: [employer]  Tobacco Use   Smoking status: Former    Current packs/day: 0.00    Types: Cigarettes    Start date: 01/05/1989    Quit date: 01/06/1999    Years since quitting: 24.3   Smokeless tobacco: Never  Vaping Use   Vaping status: Never Used  Substance and Sexual Activity   Alcohol use: No    Alcohol/week: 0.0 standard drinks of alcohol   Drug use: No   Sexual activity: Yes    Partners: Male    Birth control/protection: Other-see comments, Surgical    Comment: TAH/LSO  Other Topics Concern   Not on file  Social History Narrative   ** Merged History Encounter **       Social Determinants of Health   Financial Resource Strain: Not on file  Food Insecurity: Not on file  Transportation Needs: Not on file  Physical Activity: Not on file  Stress: Not on file  Social Connections: Unknown (02/23/2022)   Received [facility]   Social Network    Social Network: Not on file  Intimate Partner Violence: Unknown (01/15/2022)   Received from [facility]   HITS    Physically Hurt: Not on file    Insult or Talk Down To: Not on file    Threaten Physical Harm: Not on file    Scream or Curse: Not on file    FAMILY HISTORY: Family History  Problem Relation Age of Onset   Kidney disease Mother        renal failure   Stroke Mother    Diabetes Mother    Hypertension Mother    Heart failure Mother    Colon cancer Mother    Stroke Father    Cancer - Colon Maternal Aunt    Hypertension Sister    Rheum arthritis Sister    Hypertension Brother    Hypertension Sister    Hypertension Sister    Hypertension Sister    Breast cancer Sister    Kidney cancer Sister    Esophageal cancer Neg Hx    Rectal cancer Neg Hx    Stomach cancer Neg Hx     ALLERGIES:  is allergic to buspar [buspirone  hcl].  MEDICATIONS:  Current Outpatient Medications  Medication Sig Dispense Refill   acetaminophen (TYLENOL) 500 MG tablet Take 1,000 mg by mouth every 6 (six) hours as needed for moderate pain or headache.     aspirin EC 81 MG tablet Take 1 tablet (81 mg total) by mouth daily. Swallow whole. 90 tablet 3   b complex vitamins capsule Take 1 capsule by mouth daily.  betamethasone valerate (VALISONE) 0.1 % cream Apply topically as needed.     cetirizine (ZYRTEC) 10 MG tablet Take 10 mg by mouth daily.      Cholecalciferol (VITAMIN D3) 125 MCG (5000 UT) CAPS Take 5,000 Units by mouth daily.     hydrOXYzine (ATARAX) 10 MG tablet Take 1 or 2 tablets by mouth as needed for anxiety attack. 15 tablet 0   lidocaine-prilocaine (EMLA) cream Apply to affected area once (Patient not taking: Reported on 04/06/2023) 30 g 3   losartan-hydrochlorothiazide (HYZAAR) 50-12.5 MG tablet Take 1 tablet by mouth daily. for blood pressure. 90 tablet 3   Methylcobalamin (B-12) 5000 MCG TBDP Take 5,000 mcg by mouth daily.     metoprolol succinate (TOPROL XL) 25 MG 24 hr tablet Take 1 tablet (25 mg total) by mouth daily. 90 tablet 3   rosuvastatin (CRESTOR) 5 MG tablet Take 1 tablet (5 mg total) by mouth every evening. 90 tablet 3   venlafaxine XR (EFFEXOR-XR) 37.5 MG 24 hr capsule TAKE 1 CAPSULE BY MOUTH EVERY DAY WITH BREAKFAST FOR HOT FLASHES AND ANXIETY 90 capsule 0   No current facility-administered medications for this visit.    REVIEW OF SYSTEMS:    10 Point review of Systems was done is negative except as noted above.   PHYSICAL EXAMINATION: .BP 123/83   Pulse 82   Temp (!) 97.2 F (36.2 C)   Resp 20   Wt 256 lb 14.4 oz (116.5 kg)   SpO2 96%   BMI 44.10 kg/m    GENERAL:alert, in no acute distress and comfortable SKIN: no acute rashes, no significant lesions EYES: conjunctiva are pink and non-injected, sclera anicteric OROPHARYNX: MMM, no exudates, no oropharyngeal erythema or ulceration NECK:  supple, no JVD LYMPH:  no palpable lymphadenopathy in the cervical, axillary or inguinal regions LUNGS: clear to auscultation b/l with normal respiratory effort HEART: regular rate & rhythm ABDOMEN:  normoactive bowel sounds , non tender, not distended. Extremity: no pedal edema PSYCH: alert & oriented x 3 with fluent speech NEURO: no focal motor/sensory deficits   LABORATORY DATA:  I have reviewed the data as listed  .    Latest Ref Rng & Units 04/22/2023    9:41 AM 01/18/2023   10:03 AM 10/16/2022    9:57 AM  CBC  WBC 4.0 - 10.5 K/uL 6.1  6.3  4.3   Hemoglobin 12.0 - 15.0 g/dL 16.1  09.6  04.5   Hematocrit 36.0 - 46.0 % 39.5  39.9  39.9   Platelets 150 - 400 K/uL 144  150  147    .    Latest Ref Rng & Units 04/22/2023    9:41 AM 01/18/2023   10:03 AM 12/22/2022   11:43 AM  CMP  Glucose 70 - 99 mg/dL 409  811  914   BUN 6 - 20 mg/dL 15  18  16    Creatinine 0.44 - 1.00 mg/dL 7.82  9.56  2.13   Sodium 135 - 145 mmol/L 141  141  139   Potassium 3.5 - 5.1 mmol/L 4.7  4.0  4.8   Chloride 98 - 111 mmol/L 105  103  100   CO2 22 - 32 mmol/L 31  32  29   Calcium 8.9 - 10.3 mg/dL 9.8  08.6  57.8   Total Protein 6.5 - 8.1 g/dL 6.6  6.8    Total Bilirubin 0.3 - 1.2 mg/dL 0.4  0.6    Alkaline Phos 38 - 126 U/L  96  89    AST 15 - 41 U/L 20  21    ALT 0 - 44 U/L 25  27     Lab Results  Component Value Date   LDH 180 01/18/2023    HIV Screen 4th Generation wRfx     Non Reactive Non Reactive  Hepatitis B Surface Ag     NON REACTIVE NON REACTIVE  Hep B Core Total Ab     NON REACTIVE NON REACTIVE  HCV Ab     NON REACTIVE NON REACTIVE  LDH     98 - 192 U/L 228 (H)    SURGICAL PATHOLOGY  CASE: [id number]  PATIENT: Talyah Tilghman  Surgical Pathology Report   Clinical History: lesion of thoracic vertebra (cm)   FINAL MICROSCOPIC DIAGNOSIS:   A-C. BONE, T12 VERTEBRAL BODY AND MARROW, BIOPSY:  -  Non-Hodgkin B-cell lymphoma, high grade  -  See comment   COMMENT:   A-C.   The biopsies show normal hematopoeisis admixed with crush artifact  with associated marrow fibrosis and an atypical cellular infiltrate  composed of medium to large cells with irregular nuclear contours, ample  cytoplasm, variably prominent nucleoli, increased mitoses and apoptosis.  An initial panel of immunohistochemistry (cytokeratin AE1/3, cytokeratin  7, cytokeratin 20, CD20 and CD3) confirmed the morphologic impression  that the atypical cells were lymphoid in nature.  The atypical  lymphocytes are positive for Pax-5, CD20, CD10, bcl-6 but negative for  CD30, bcl-2, mum-1 and EBV by in-situ hybridization. CD3 and CD5  highlight background T-cells.  The proliferative rate by ki-67 is  increased (50-60%).  Overall, the findings are consistent with a  non-Hodgkin B-cell lymphoma, high grade with a germinal center  phenotype.   FISH studies are pending and will be reported in an  addendum.  These findings were discussed with Dr. Danielle Dess on August 06, 2021.    RADIOGRAPHIC STUDIES: I have personally reviewed the radiological images as listed and agreed with the findings in the report.  ECHO 09/08/2021 1.Left ventricular ejection fraction, by estimation, is 55 to 60%. The left ventricle has normal function. The left ventricle has no regional wall motion abnormalities. There ismild left ventricular hypertrophy. Left ventricular diastolic parameters were normal.The average left ventricular global longitudinal strain is -20.4 %. The global longitudinal strain is normal. 2. Right ventricular systolic function is normal. The right ventricular size is normal. 3. Left atrial size was mildly dilated. The mitral valve is normal in structure. Trivial mitral valve regurgitation. No evidence of mitral stenosis. Moderate mitral annular calcification. 4.The aortic valve is normal in structure. Aortic valve regurgitation is not visualized. No aortic stenosis is present. 5.The inferior vena cava is  normal in size with greater than 50% respiratory variability, suggesting right atrial pressure of 3 mmHg.  PET/CT scan 09/09/2022  IMPRESSION: 1. No specific findings of active malignancy. Previous low-grade activity in the T12 vertebral body has resolved. 2. Focal accentuated activity in the distal esophagus with maximum SUV 4.4, likely physiologic, no CT correlate or significant accentuated activity on the prior exam in this region. 3. Hepatic steatosis. 4. Nonobstructive bilateral nephrolithiasis. 5. Aortic and coronary atherosclerosis.  Mitral valve calcification.   ASSESSMENT & PLAN:   57 year old female with  1) Stage IVA high-grade large B cell non-Hodgkin's lymphoma.  Germinal center phenotype.  C-Myc not rearranged.  Ki-67 50 to 60% No overt constitutional symptoms PET/CT with involvement of thoracic spine and acromion process on the right.  Abdominal lymphadenopathy.  Hepatitis testing unremarkable HIV nonreactive Myeloma panel shows no M spike protein spike.  Echocardiogram shows normal ejection fraction  PET CT scan done on 09/02/2021 shows Scattered mild hypermetabolism within subcentimeter periportal, retroperitoneal and mesenteric lymph nodes, compatible with the given history of large B-cell lymphoma. 2. Hypermetabolism within known T7 and T12 lesions, better seen on MR thoracic spine 07/04/2021. 3. Focal hypermetabolism in the acromion of the right scapula, without a definite CT correlate. Recommend attention on follow-up.  2) Chemotherapy related neutropenia -now resolved.  No issues with infections.    3)h/o  G-CSF related significant bone pain.  PLAN  -Patient is about 16 months out from receiving her last treatment -01/04/2023 CT coronary morph showed no significant extracardiac incidental findings  -Discussed lab results on 04/22/2023 in detail with patient. CBC normal, showed WBC of 6.1K, hemoglobin of 13.1, and platelets of 144K. -CMP normal -recommend  patient to maintain good hydration, stay regularly active, limit caffeine intake, and take vitamin E supplements 400-600 units a day, or evening primrose tea supplements to improve hot flashes -if hot flashes are persistent or bothersome despite SSRI use, may consider short term hormone replacement -No indication of recurrence/progression of lymphoma.  -Answered all of patient's questions.  -will continue to monitor with labs in 4 months -Continue to follow-up with PCP and cardiologist.   FOLLOW-UP: RTC with Dr Candise Che with labs in 4 months  The total time spent in the appointment was 25 minutes* .  All of the patient's questions were answered with apparent satisfaction. The patient knows to call the clinic with any problems, questions or concerns.   Wyvonnia Lora MD MS AAHIVMS [facility] Hematology/Oncology Physician [facility]  .*Total Encounter Time as defined by the Centers for Medicare and Medicaid Services includes, in addition to the face-to-face time of a patient visit (documented in the note above) non-face-to-face time: obtaining and reviewing outside history, ordering and reviewing medications, tests or procedures, care coordination (communications with other health care professionals or caregivers) and documentation in the medical record.    I,Mitra Faeizi,acting as a Neurosurgeon for Wyvonnia Lora, MD.,have documented all relevant documentation on the behalf of Wyvonnia Lora, MD,as directed by  Wyvonnia Lora, MD while in the presence of Wyvonnia Lora, MD.  .I have reviewed the above documentation for accuracy and completeness, and I agree with the above. Johney Maine MD

## 2023-04-28 ENCOUNTER — Encounter: Payer: Self-pay | Admitting: Hematology

## 2023-05-11 ENCOUNTER — Telehealth: Payer: Self-pay | Admitting: Cardiology

## 2023-05-11 DIAGNOSIS — R400 Somnolence: Secondary | ICD-10-CM

## 2023-05-11 NOTE — Telephone Encounter (Signed)
Returned pt call to let her know that her message will be forwarded to the provider and his nurse for advice and call her back after that is received.  Pt is requesting a callback regarding sleep apnea study. She stated she was supposed to get a callback before opening it to let her know if insurance will cover it but she still hasn't heard anything. Please advise

## 2023-05-11 NOTE — Telephone Encounter (Signed)
Pt is requesting a callback regarding sleep apnea study. She stated she was supposed to get a callback before opening it to let her know if insurance will cover it but she still hasn't heard anything. Please advise

## 2023-05-11 NOTE — Telephone Encounter (Signed)
Check with Brandi.  Apparently Dr. Jerene Pitch ordered an Itamar study.  If so this may have been read by Dr. Mayford Knife if it was already done.

## 2023-05-12 ENCOUNTER — Ambulatory Visit: Payer: 59 | Admitting: Hematology

## 2023-05-12 ENCOUNTER — Other Ambulatory Visit: Payer: 59

## 2023-05-12 ENCOUNTER — Encounter (INDEPENDENT_AMBULATORY_CARE_PROVIDER_SITE_OTHER): Payer: Self-pay

## 2023-05-27 ENCOUNTER — Encounter: Payer: Self-pay | Admitting: Primary Care

## 2023-05-27 ENCOUNTER — Ambulatory Visit (INDEPENDENT_AMBULATORY_CARE_PROVIDER_SITE_OTHER): Payer: 59 | Admitting: Primary Care

## 2023-05-27 VITALS — BP 124/74 | HR 75 | Temp 97.9°F | Ht 64.0 in | Wt 255.8 lb

## 2023-05-27 DIAGNOSIS — R400 Somnolence: Secondary | ICD-10-CM | POA: Diagnosis not present

## 2023-05-27 DIAGNOSIS — Z1231 Encounter for screening mammogram for malignant neoplasm of breast: Secondary | ICD-10-CM

## 2023-05-27 DIAGNOSIS — Z0000 Encounter for general adult medical examination without abnormal findings: Secondary | ICD-10-CM

## 2023-05-27 DIAGNOSIS — R0609 Other forms of dyspnea: Secondary | ICD-10-CM

## 2023-05-27 DIAGNOSIS — I10 Essential (primary) hypertension: Secondary | ICD-10-CM

## 2023-05-27 DIAGNOSIS — E785 Hyperlipidemia, unspecified: Secondary | ICD-10-CM | POA: Diagnosis not present

## 2023-05-27 DIAGNOSIS — Z0001 Encounter for general adult medical examination with abnormal findings: Secondary | ICD-10-CM

## 2023-05-27 DIAGNOSIS — R232 Flushing: Secondary | ICD-10-CM

## 2023-05-27 DIAGNOSIS — C8330 Diffuse large B-cell lymphoma, unspecified site: Secondary | ICD-10-CM

## 2023-05-27 DIAGNOSIS — M545 Low back pain, unspecified: Secondary | ICD-10-CM

## 2023-05-27 DIAGNOSIS — G8929 Other chronic pain: Secondary | ICD-10-CM

## 2023-05-27 DIAGNOSIS — E1165 Type 2 diabetes mellitus with hyperglycemia: Secondary | ICD-10-CM

## 2023-05-27 DIAGNOSIS — F419 Anxiety disorder, unspecified: Secondary | ICD-10-CM

## 2023-05-27 DIAGNOSIS — Z1211 Encounter for screening for malignant neoplasm of colon: Secondary | ICD-10-CM

## 2023-05-27 LAB — IBC + FERRITIN
Ferritin: 57.8 ng/mL (ref 10.0–291.0)
Iron: 100 ug/dL (ref 42–145)
Saturation Ratios: 22.5 % (ref 20.0–50.0)
TIBC: 445.2 ug/dL (ref 250.0–450.0)
Transferrin: 318 mg/dL (ref 212.0–360.0)

## 2023-05-27 LAB — LIPID PANEL
Cholesterol: 133 mg/dL (ref 0–200)
HDL: 56 mg/dL (ref >39.00)
LDL Cholesterol: 61 mg/dL (ref 0–99)
NonHDL: 76.98
Total CHOL/HDL Ratio: 2
Triglycerides: 78 mg/dL (ref 0.0–149.0)
VLDL: 15.6 mg/dL (ref 0.0–40.0)

## 2023-05-27 LAB — HEMOGLOBIN A1C: Hgb A1c MFr Bld: 6.1 % (ref 4.6–6.5)

## 2023-05-27 LAB — TSH: TSH: 1.47 u[IU]/mL (ref 0.35–5.50)

## 2023-05-27 MED ORDER — VENLAFAXINE HCL ER 75 MG PO CP24
75.0000 mg | ORAL_CAPSULE | Freq: Every day | ORAL | 0 refills | Status: DC
Start: 2023-05-27 — End: 2023-08-24

## 2023-05-27 NOTE — Assessment & Plan Note (Signed)
Some improvement, not quite at goal.  Increase venlafaxine ER to 75 mg daily.  She will update.

## 2023-05-27 NOTE — Assessment & Plan Note (Signed)
Intolerant to atorvastatin. Continue rosuvastatin 5 mg daily.  Repeat lipid panel pending.

## 2023-05-27 NOTE — Telephone Encounter (Signed)
Patient called to follow-up on getting her sleep study scheduled.

## 2023-05-27 NOTE — Progress Notes (Signed)
Subjective:    Patient ID: Virginia Oneal, female    DOB: 1966/01/10, 58 y.o.   MRN: [id number]  HPI  Virginia Oneal is a very pleasant 57 y.o. female who presents today for complete physical and follow up of chronic conditions.  She continues to experience daytime tiredness which is chronic. She is sleeping well and throughout the night, wakes up feeling tired, feels tired during the day. She does snore at night. She was referred for sleep study per cardiology, has yet to connect for the study but plans on scheduling.  She questions if her iron levels are low.  She continues to experience hot flashes which occur multiple times daily. She is managed on venlafaxine ER 37.5 mg daily which helps some with hot flashes and has helped a lot with anxiety.   Immunizations: -Tetanus: Completed in 2020 -Shingles: Completed Shingrix series  Diet: Fair diet.  Exercise: No regular exercise.  Eye exam: Completed years ago.  Dental exam: Completes semi-annually    Pap Smear: Hysterectomy Mammogram: July 2023   Colonoscopy: Completed in 2019, due 2024   BP Readings from Last 3 Encounters:  05/27/23 124/74  04/22/23 123/83  04/06/23 110/76      Review of Systems  Constitutional:  Negative for unexpected weight change.  HENT:  Negative for rhinorrhea.   Respiratory:  Negative for cough and shortness of breath.   Cardiovascular:  Negative for chest pain.  Gastrointestinal:  Negative for constipation and diarrhea.  Genitourinary:  Negative for difficulty urinating.       Hot flashes  Musculoskeletal:  Positive for arthralgias and back pain.  Skin:  Negative for rash.  Allergic/Immunologic: Negative for environmental allergies.  Neurological:  Negative for dizziness and headaches.  Psychiatric/Behavioral:  Negative for sleep disturbance.          Past Medical History:  Diagnosis Date   Abnormal uterine bleeding    Anemia    as a teenager   Anxiety    postpartum and hx  panic attacks   Arrhythmia    Arthritis    back and hands   Chest mass    by CT scanning   Chest pain 07/26/2014   Complication of anesthesia    slow to wake up   COVID-19 10/2020   Depression    DOE (dyspnea on exertion) 07/26/2014   Family history of adverse reaction to anesthesia    mom slow to wake up   Gestational diabetes    resolved after pregnancy   Gross hematuria 11/20/2020   History of kidney stones    Hypertension    Personal history of colonic polyps    PONV (postoperative nausea and vomiting)    PREMATURE ATRIAL CONTRACTIONS 08/15/2008   Qualifier: Diagnosis of  By: Hetty Ely MD, Franne Grip    RENAL CALCULUS, HX OF 08/15/2008   Qualifier: Diagnosis of  By: Lorrene Reid LPN, Wanda     Rotator cuff tendinitis 06/29/2011   Seasonal allergies    Urinary incontinence 05/2009   hx bladder sling--Dr. Edward Jolly    Social History   Socioeconomic History   Marital status: Married    Spouse name: Not on file   Number of children: 2   Years of education: Not on file   Highest education level: Not on file  Occupational History   Occupation: [employer]  Tobacco Use   Smoking status: Former    Current packs/day: 0.00    Types: Cigarettes    Start date: 01/05/1989  Quit date: 01/06/1999    Years since quitting: 24.4   Smokeless tobacco: Never  Vaping Use   Vaping status: Never Used  Substance and Sexual Activity   Alcohol use: No    Alcohol/week: 0.0 standard drinks of alcohol   Drug use: No   Sexual activity: Yes    Partners: Male    Birth control/protection: Other-see comments, Surgical    Comment: TAH/LSO  Other Topics Concern   Not on file  Social History Narrative   ** Merged History Encounter **       Social Determinants of Health   Financial Resource Strain: Not on file  Food Insecurity: Not on file  Transportation Needs: Not on file  Physical Activity: Not on file  Stress: Not on file  Social Connections: Unknown (02/23/2022)   Received [facility]   Social Network    Social Network: Not on file  Intimate Partner Violence: Unknown (01/15/2022)   Received from [facility]   HITS    Physically Hurt: Not on file    Insult or Talk Down To: Not on file    Threaten Physical Harm: Not on file    Scream or Curse: Not on file    Past Surgical History:  Procedure Laterality Date   ABDOMINAL HYSTERECTOMY  05/2009   TAH/LSO   BLADDER SUSPENSION  2010   Dr. Edward Jolly   BREAST BIOPSY     left stereo   CESAREAN SECTION  03/2003   CESAREAN SECTION  2004   CHOLECYSTECTOMY N/A 01/02/2021   Procedure: LAPAROSCOPIC CHOLECYSTECTOMY;  Surgeon: Emelia Loron, MD;  Location: [facility] ORS;  Service: General;  Laterality: N/A;  58  - GEN AND TAP BLOCK LDOW   COLONOSCOPY  09/08   COLPORRHAPHY  05/2009   Dr. Edward Jolly   ENDOMETRIAL ABLATION  2006   Dr. Edward Jolly   INCONTINENCE SURGERY     sling for urinary incontinence   IR IMAGING GUIDED PORT INSERTION  09/01/2021   IR REMOVAL TUN ACCESS W/ PORT W/O FL MOD SED  10/29/2022   KYPHOPLASTY N/A 04/07/2021   Procedure: Thoracic 12 Vertebral body biopsy;  Surgeon: Barnett Abu, MD;  Location: [facility] OR;  Service: Neurosurgery;  Laterality: N/A;   LAPAROSCOPIC ENDOMETRIOSIS FULGURATION     ablation   LUMBAR LAMINECTOMY/DECOMPRESSION MICRODISCECTOMY N/A 07/31/2021   Procedure: Open Transpedicular biopsy of Thoracic twelve;  Surgeon: Barnett Abu, MD;  Location: [facility] OR;  Service: Neurosurgery;  Laterality: N/A;   OOPHORECTOMY  2010   w/hyst--Dr. Edward Jolly   TUBAL LIGATION  2004   w/ C-section Dr. Edward Jolly   TYMPANOPLASTY     right ear   VAGINAL HYSTERECTOMY  05/21/2009   lap assisted /partial    Family History  Problem Relation Age of Onset   Kidney disease Mother        renal failure   Stroke Mother    Diabetes Mother    Hypertension Mother    Heart failure Mother    Colon cancer Mother    Stroke Father    Cancer - Colon Maternal Aunt    Hypertension Sister    Rheum arthritis Sister     Hypertension Brother    Hypertension Sister    Hypertension Sister    Hypertension Sister    Breast cancer Sister    Kidney cancer Sister    Esophageal cancer Neg Hx    Rectal cancer Neg Hx    Stomach cancer Neg Hx     Allergies  Allergen Reactions  Buspar [Buspirone Hcl]     Worsened her panic attacks    Current Outpatient Medications on File Prior to Visit  Medication Sig Dispense Refill   acetaminophen (TYLENOL) 500 MG tablet Take 1,000 mg by mouth every 6 (six) hours as needed for moderate pain or headache.     aspirin EC 81 MG tablet Take 1 tablet (81 mg total) by mouth daily. Swallow whole. 90 tablet 3   b complex vitamins capsule Take 1 capsule by mouth daily.     betamethasone valerate (VALISONE) 0.1 % cream Apply topically as needed.     cetirizine (ZYRTEC) 10 MG tablet Take 10 mg by mouth daily.      Cholecalciferol (VITAMIN D3) 125 MCG (5000 UT) CAPS Take 5,000 Units by mouth daily.     hydrOXYzine (ATARAX) 10 MG tablet Take 1 or 2 tablets by mouth as needed for anxiety attack. 15 tablet 0   losartan-hydrochlorothiazide (HYZAAR) 50-12.5 MG tablet Take 1 tablet by mouth daily. for blood pressure. 90 tablet 3   Methylcobalamin (B-12) 5000 MCG TBDP Take 5,000 mcg by mouth daily.     metoprolol succinate (TOPROL XL) 25 MG 24 hr tablet Take 1 tablet (25 mg total) by mouth daily. 90 tablet 3   rosuvastatin (CRESTOR) 5 MG tablet Take 1 tablet (5 mg total) by mouth every evening. 90 tablet 3   lidocaine-prilocaine (EMLA) cream Apply to affected area once (Patient not taking: Reported on 04/06/2023) 30 g 3   No current facility-administered medications on file prior to visit.    BP 124/74 (BP Location: Left Arm, Patient Position: Sitting, Cuff Size: Normal)   Pulse 75   Temp 97.9 F (36.6 C) (Oral)   Ht 5\' 4"  (1.626 m)   Wt 255 lb 12.8 oz (116 kg)   SpO2 96%   BMI 43.91 kg/m  Objective:   Physical Exam HENT:     Right Ear: Tympanic membrane and ear canal normal.      Left Ear: Tympanic membrane and ear canal normal.     Nose: Nose normal.  Eyes:     Conjunctiva/sclera: Conjunctivae normal.     Pupils: Pupils are equal, round, and reactive to light.  Neck:     Thyroid: No thyromegaly.  Cardiovascular:     Rate and Rhythm: Normal rate and regular rhythm.     Heart sounds: No murmur heard. Pulmonary:     Effort: Pulmonary effort is normal.     Breath sounds: Normal breath sounds. No rales.  Abdominal:     General: Bowel sounds are normal.     Palpations: Abdomen is soft.     Tenderness: There is no abdominal tenderness.  Musculoskeletal:        General: Normal range of motion.     Cervical back: Neck supple.  Lymphadenopathy:     Cervical: No cervical adenopathy.  Skin:    General: Skin is warm and dry.     Findings: No rash.  Neurological:     Mental Status: She is alert and oriented to person, place, and time.     Cranial Nerves: No cranial nerve deficit.     Deep Tendon Reflexes: Reflexes are normal and symmetric.  Psychiatric:        Mood and Affect: Mood normal.           Assessment & Plan:  Encounter for annual general medical examination with abnormal findings in adult Assessment & Plan: Immunizations UTD. Mammogram due, orders placed. Colonoscopy due, referral  placed to GI  Discussed the importance of a healthy diet and regular exercise in order for weight loss, and to reduce the risk of further co-morbidity.  Exam stable. Labs pending.  Follow up in 1 year for repeat physical.    Essential hypertension Assessment & Plan: Controlled.  Continue losartan-hydrochlorothiazide 50-12.5 mg daily. Continue metoprolol succinate 25 mg daily.  Reviewed cardiology notes from June 2024   Hot flashes Assessment & Plan: Some improvement, not quite at goal.  Increase venlafaxine ER to 75 mg daily.  She will update.  Orders: -     Venlafaxine HCl ER; Take 1 capsule (75 mg total) by mouth daily with breakfast. For hot  flashes and anxiety  Dispense: 90 capsule; Refill: 0  Type 2 diabetes mellitus with hyperglycemia, without long-term current use of insulin (HCC) Assessment & Plan: Repeat A1C pending.  Continue off treatment for now.  Follow up in 6 months.  Orders: -     Hemoglobin A1c  Chronic bilateral low back pain without sciatica Assessment & Plan: Stable.  No concerns today. Continue to monitor.   Diffuse large B-cell lymphoma, unspecified body region [facility]) Assessment & Plan: In remission. Following with oncology.  Reviewed office notes and labs from April 2024. Reviewed PET scan from November 2023.   Exertional dyspnea Assessment & Plan: Continued. Evaluated by cardiology.  Reviewed office notes from June 2024.   Screening mammogram for breast cancer -     3D Screening Mammogram, Left and Right; Future  Screening for colon cancer -     Ambulatory referral to Gastroenterology  Anxiety Assessment & Plan: Improved!  Increase venlafaxine ER to 75 mg daily for hot flashes. She will update in about a month.   Daytime sleepiness Assessment & Plan: Suspicious for sleep apnea.  Recommended that she move forward with a sleep study.  Will also check labs today to rule out metabolic cause. We also discussed that certain medications that could be causing symptoms.    Orders: -     IBC + Ferritin -     TSH  Hyperlipidemia, unspecified hyperlipidemia type Assessment & Plan: Intolerant to atorvastatin. Continue rosuvastatin 5 mg daily.  Repeat lipid panel pending.  Orders: -     Lipid panel        Doreene Nest, NP

## 2023-05-27 NOTE — Assessment & Plan Note (Signed)
Improved!  Increase venlafaxine ER to 75 mg daily for hot flashes. She will update in about a month.

## 2023-05-27 NOTE — Assessment & Plan Note (Signed)
In remission. Following with oncology.  Reviewed office notes and labs from April 2024. Reviewed PET scan from November 2023.

## 2023-05-27 NOTE — Assessment & Plan Note (Signed)
Stable.  No concerns today. Continue to monitor.  

## 2023-05-27 NOTE — Assessment & Plan Note (Signed)
Immunizations UTD. Mammogram due, orders placed. Colonoscopy due, referral placed to GI  Discussed the importance of a healthy diet and regular exercise in order for weight loss, and to reduce the risk of further co-morbidity.  Exam stable. Labs pending.  Follow up in 1 year for repeat physical.

## 2023-05-27 NOTE — Patient Instructions (Signed)
Stop by the lab prior to leaving today. I will notify you of your results once received.   You will either be contacted via phone regarding your referral to GI, or you may receive a letter on your MyChart portal from our referral team with instructions for scheduling an appointment. Please let us know if you have not been contacted by anyone within two weeks.  We increased the dose of your venlafaxine ER to 75 mg daily for hot flashes.  Try moving this medication to bedtime to see if this helps with daytime tiredness.  Please schedule the sleep study as discussed.  It was a pleasure to see you today!

## 2023-05-27 NOTE — Assessment & Plan Note (Signed)
Repeat A1C pending.  Continue off treatment for now.  Follow up in 6 months.

## 2023-05-27 NOTE — Assessment & Plan Note (Signed)
Continued. Evaluated by cardiology.  Reviewed office notes from June 2024.

## 2023-05-27 NOTE — Assessment & Plan Note (Signed)
Suspicious for sleep apnea.  Recommended that she move forward with a sleep study.  Will also check labs today to rule out metabolic cause. We also discussed that certain medications that could be causing symptoms.

## 2023-05-27 NOTE — Assessment & Plan Note (Signed)
Controlled.  Continue losartan-hydrochlorothiazide 50-12.5 mg daily. Continue metoprolol succinate 25 mg daily.  Reviewed cardiology notes from June 2024

## 2023-05-28 ENCOUNTER — Telehealth: Payer: Self-pay | Admitting: Cardiology

## 2023-05-28 NOTE — Telephone Encounter (Signed)
New Message:     Pateint is calling to see if her insurance have approved her Home Sleep Study?here

## 2023-06-01 ENCOUNTER — Encounter: Payer: Self-pay | Admitting: Cardiology

## 2023-06-02 NOTE — Telephone Encounter (Signed)
Patient is calling back for update. Please advise  

## 2023-06-02 NOTE — Addendum Note (Signed)
**Note De-Identified Virginia Oneal Obfuscation** Addended by: Demetrios Loll on: 06/02/2023 01:57 PM   Modules accepted: Orders

## 2023-06-02 NOTE — Telephone Encounter (Signed)
**Note De-Identified [name]** Per [facility] Portal concerning [name] PA: Notification or Prior Authorization is not required for the requested services. Decision ID #: [id number]  I called the pt and apologized for the delay in [name] calling her with the WatchPAT One-HST Pin number so she can proceed with her HST and for the delay in returning her calls concerning her HST.  She verified that she was given a Virginia Oneal at her office visit with Dr Bjorn Pippin on 6/25.  I did provide her with Pin #: "[id number]" so she can now proceed with her HST.  She was very understanding and thanked me for my call.

## 2023-06-04 ENCOUNTER — Other Ambulatory Visit: Payer: Self-pay | Admitting: Primary Care

## 2023-06-04 ENCOUNTER — Encounter (INDEPENDENT_AMBULATORY_CARE_PROVIDER_SITE_OTHER): Payer: 59 | Admitting: Cardiology

## 2023-06-04 DIAGNOSIS — R002 Palpitations: Secondary | ICD-10-CM

## 2023-06-04 DIAGNOSIS — G4733 Obstructive sleep apnea (adult) (pediatric): Secondary | ICD-10-CM

## 2023-06-04 DIAGNOSIS — I10 Essential (primary) hypertension: Secondary | ICD-10-CM

## 2023-06-07 ENCOUNTER — Ambulatory Visit: Payer: 59 | Attending: Cardiovascular Disease

## 2023-06-07 DIAGNOSIS — R400 Somnolence: Secondary | ICD-10-CM

## 2023-06-07 NOTE — Procedures (Signed)
SLEEP STUDY REPORT Patient Information Study Date: 06/04/2023 Patient Name: Virginia Oneal Patient ID: [id number] Birth Date: 1966-10-12 Age: 57 Gender: Female BMI: 42.9 (W=251 lb, H=5' 4'') Referring Physician: Epifanio Lesches, MD  TEST DESCRIPTION: Home sleep apnea testing was completed using the WatchPat, a Type 1 device, utilizing  peripheral arterial tonometry (PAT), chest movement, actigraphy, pulse oximetry, pulse rate, body position and snore.  AHI was calculated with apnea and hypopnea using valid sleep time as the denominator. RDI includes apneas,  hypopneas, and RERAs. The data acquired and the scoring of sleep and all associated events were performed in  accordance with the recommended standards and specifications as outlined in the AASM Manual for the Scoring of  Sleep and Associated Events 2.2.0 (2015).   FINDINGS:   1. Mild Obstructive Sleep Apnea with AHI 10.5/hr.   2. No Central Sleep Apnea with pAHIc 1/hr.   3. Oxygen desaturations as low as 82%.   4. Severe snoring was present. O2 sats were < 88% for 1.8 min.   5. Total sleep time was 7 hrs and 3 min.   6. 27.8% of total sleep time was spent in REM sleep.   7. Normal sleep onset latency at 17 min.   8. Prolonged REM sleep onset latency at 141 min.   9. Total awakenings were 10.  10. Arrhythmia detection: Suggestive of possible brief atrial fibrillation lasting 46 seconds. This is not diagnostic and  further testing with outpatient telemetry monitoring is recommended.  DIAGNOSIS: Mild Obstructive Sleep Apnea (G47.33) Possible Atrial Fibrillation  RECOMMENDATIONS: 1. Clinical correlation of these findings is necessary. The decision to treat obstructive sleep apnea (OSA) is usually  based on the presence of apnea symptoms or the presence of associated medical conditions such as Hypertension,  Congestive Heart Failure, Atrial Fibrillation or Obesity. The most common symptoms of OSA are snoring, gasping for   breath while sleeping, daytime sleepiness and fatigue.   2. Initiating apnea therapy is recommended given the presence of symptoms and/or associated conditions.  Recommend proceeding with one of the following:   a. Auto-CPAP therapy with a pressure range of 5-20cm H2O.   b. An oral appliance (OA) that can be obtained from certain dentists with expertise in sleep medicine. These are  primarily of use in non-obese patients with mild and moderate disease.   c. An ENT consultation which may be useful to look for specific causes of obstruction and possible treatment  options.   d. If patient is intolerant to PAP therapy, consider referral to ENT for evaluation for hypoglossal nerve stimulator.   3. Close follow-up is necessary to ensure success with CPAP or oral appliance therapy for maximum benefit .  4. A follow-up oximetry study on CPAP is recommended to assess the adequacy of therapy and determine the need  for supplemental oxygen or the potential need for Bi-level therapy. An arterial blood gas to determine the adequacy of  baseline ventilation and oxygenation should also be considered.  5. Healthy sleep recommendations include: adequate nightly sleep (normal 7-9 hrs/night), avoidance of caffeine after  noon and alcohol near bedtime, and maintaining a sleep environment that is cool, dark and quiet.  6. Weight loss for overweight patients is recommended. Even modest amounts of weight loss can significantly  improve the severity of sleep apnea.  7. Snoring recommendations include: weight loss where appropriate, side sleeping, and avoidance of alcohol before  bed.  8. Operation of motor vehicle should be avoided when sleepy.  Signature: Jaramiah Bossard  Mayford Knife, MD; [facility]; Diplomat, American Board of Sleep  Medicine Electronically Signed: 06/07/2023 12:15:53 PM

## 2023-06-08 ENCOUNTER — Ambulatory Visit: Payer: 59 | Attending: Cardiology

## 2023-06-08 ENCOUNTER — Telehealth: Payer: Self-pay

## 2023-06-08 DIAGNOSIS — R002 Palpitations: Secondary | ICD-10-CM

## 2023-06-08 DIAGNOSIS — G4733 Obstructive sleep apnea (adult) (pediatric): Secondary | ICD-10-CM

## 2023-06-08 NOTE — Progress Notes (Signed)
Possible A-fib seen on sleep study, recommend Zio patch x 2 weeks

## 2023-06-08 NOTE — Addendum Note (Signed)
Addended by: Neoma Laming on: 06/08/2023 02:25 PM   Modules accepted: Orders

## 2023-06-08 NOTE — Progress Notes (Unsigned)
Enrolled patient for a 14 day Zio XT  monitor to be mailed to patients home  °

## 2023-06-08 NOTE — Telephone Encounter (Signed)
Spoke to patient recent sleep study showed possible afib.Dr.Schumann ordered a 14 day Zio heart monitor.Advised monitor will be mailed to your home with instructions.

## 2023-06-09 ENCOUNTER — Other Ambulatory Visit: Payer: Self-pay

## 2023-06-09 ENCOUNTER — Ambulatory Visit: Payer: 59 | Attending: Cardiology

## 2023-06-09 DIAGNOSIS — R002 Palpitations: Secondary | ICD-10-CM

## 2023-06-09 NOTE — Progress Notes (Unsigned)
Enrolled for [employer] to mail a ZIO XT long term holter monitor to the patients address on file.  

## 2023-06-11 DIAGNOSIS — G4733 Obstructive sleep apnea (adult) (pediatric): Secondary | ICD-10-CM | POA: Diagnosis not present

## 2023-06-11 DIAGNOSIS — R002 Palpitations: Secondary | ICD-10-CM

## 2023-06-17 ENCOUNTER — Telehealth: Payer: Self-pay

## 2023-06-17 NOTE — Telephone Encounter (Signed)
-----   Message from Armanda Magic sent at 06/07/2023 12:18 PM EDT ----- Please let patient know that they have sleep apnea and recommend treating with CPAP.  Please order an auto CPAP from 4-15cm H2O with heated humidity and mask of choice.  Order overnight pulse ox on CPAP.  Followup with me in 6 weeks.

## 2023-06-17 NOTE — Telephone Encounter (Signed)
Notified patient of sleep study results and recommendations. Patient declined CPAP Therapy. Patient stated, "I cannot wear that mask, I cannot sleep with anything on my face. I'm not going to try it." Staff explained risks associated with Sleep Apnea. All questions were answered, patient verbalized understanding.

## 2023-06-25 ENCOUNTER — Ambulatory Visit
Admission: RE | Admit: 2023-06-25 | Discharge: 2023-06-25 | Disposition: A | Discharge status: Home / Self Care | Payer: 59 | Source: Ambulatory Visit | Attending: Primary Care

## 2023-06-25 DIAGNOSIS — Z1231 Encounter for screening mammogram for malignant neoplasm of breast: Secondary | ICD-10-CM

## 2023-07-13 DIAGNOSIS — D120 Benign neoplasm of cecum: Secondary | ICD-10-CM | POA: Diagnosis not present

## 2023-07-13 DIAGNOSIS — D123 Benign neoplasm of transverse colon: Secondary | ICD-10-CM | POA: Diagnosis not present

## 2023-07-19 ENCOUNTER — Encounter: Payer: Self-pay | Admitting: Hematology

## 2023-07-21 ENCOUNTER — Other Ambulatory Visit: Payer: Self-pay | Admitting: Primary Care

## 2023-07-21 DIAGNOSIS — F419 Anxiety disorder, unspecified: Secondary | ICD-10-CM

## 2023-07-21 DIAGNOSIS — R232 Flushing: Secondary | ICD-10-CM

## 2023-07-22 ENCOUNTER — Ambulatory Visit: Payer: 59 | Attending: Cardiology | Admitting: Cardiology

## 2023-07-22 ENCOUNTER — Encounter: Payer: Self-pay | Admitting: Cardiology

## 2023-07-22 VITALS — BP 122/70 | HR 88 | Ht 64.0 in | Wt 258.0 lb

## 2023-07-22 DIAGNOSIS — E785 Hyperlipidemia, unspecified: Secondary | ICD-10-CM | POA: Diagnosis not present

## 2023-07-22 DIAGNOSIS — I2510 Atherosclerotic heart disease of native coronary artery without angina pectoris: Secondary | ICD-10-CM

## 2023-07-22 DIAGNOSIS — I10 Essential (primary) hypertension: Secondary | ICD-10-CM | POA: Diagnosis not present

## 2023-07-22 DIAGNOSIS — G4733 Obstructive sleep apnea (adult) (pediatric): Secondary | ICD-10-CM | POA: Diagnosis not present

## 2023-07-22 HISTORY — DX: Morbid (severe) obesity due to excess calories: E66.01

## 2023-07-22 HISTORY — DX: Atherosclerotic heart disease of native coronary artery without angina pectoris: I25.10

## 2023-07-22 NOTE — Progress Notes (Signed)
Cardiology Office Note:    Date:  07/25/2023   ID:  Kellie Shropshire, DOB [birth year], MRN [id number]  PCP:  Doreene Nest, NP  Cardiologist:  None  Electrophysiologist:  None   Referring MD: Doreene Nest, NP   Chief Complaint  Patient presents with   Chest Pain    History of Present Illness:    Virginia Oneal is a 57 y.o. female with a hx of hypertension, T2DM, large B-cell lymphoma s/p chemoradiation who presents for follow-up.  She was initially seen on 12/22/2022.  She reports she recently started walking and had chest pain.  Describes as tightness on left side of chest.  Also felt short of breath.  Happened 1-2 times when she tried walking.  Also reports shortness of breath with exertion.  She denies any lightheadedness, syncope, or lower extremity edema.  Does report some palpitations when she is exerting herself.  She quit smoking in 2000, smoked about 0.5 to 1 pack/day x 7 years.  Family history includes mother had MI at 63.  Echocardiogram 09/08/2021 showed normal biventricular function, no significant valvular disease.  Coronary CTA on 01/04/2023 showed nonobstructive CAD with noncalcified plaque in mid RCA causing 25 to 49% stenosis and calcified plaque in left main causing less than 25% stenosis, calcium score 15 (78th percentile).  Echocardiogram 01/20/2023 showed normal biventricular function, grade 1 diastolic dysfunction, moderate mitral annular calcification, no significant valvular disease.  Sleep study suggested possible Afib, so underwnet Zio x 14 days which showed 1 episode of NSVT lasting 4 beats, and 3 episodes of SVT lasting 13 beats.  Since last clinic visit, she reports she is doing better.  Chest pain significantly improved, does still have some shortness of breath.  She is walking 3 miles per day for about an hour.  She denies any lightheadedness, syncope, lower extreme edema, or palpitations.  Past Medical History:  Diagnosis Date   Abnormal uterine  bleeding    Anemia    as a teenager   Anxiety    postpartum and hx panic attacks   Arrhythmia    Arthritis    back and hands   Chest mass    by CT scanning   Chest pain 07/26/2014   Complication of anesthesia    slow to wake up   COVID-19 10/2020   Depression    DOE (dyspnea on exertion) 07/26/2014   Family history of adverse reaction to anesthesia    mom slow to wake up   Gestational diabetes    resolved after pregnancy   Gross hematuria 11/20/2020   History of kidney stones    Hypertension    Personal history of colonic polyps    PONV (postoperative nausea and vomiting)    PREMATURE ATRIAL CONTRACTIONS 08/15/2008   Qualifier: Diagnosis of  By: Hetty Ely MD, Franne Grip    RENAL CALCULUS, HX OF 08/15/2008   Qualifier: Diagnosis of  By: Lorrene Reid LPN, Wanda     Rotator cuff tendinitis 06/29/2011   Seasonal allergies    Urinary incontinence 05/2009   hx bladder sling--Dr. Edward Jolly    Past Surgical History:  Procedure Laterality Date   ABDOMINAL HYSTERECTOMY  05/2009   TAH/LSO   BLADDER SUSPENSION  2010   Dr. Edward Jolly   BREAST BIOPSY     left stereo   CESAREAN SECTION  03/2003   CESAREAN SECTION  2004   CHOLECYSTECTOMY N/A 01/02/2021   Procedure: LAPAROSCOPIC CHOLECYSTECTOMY;  Surgeon: Emelia Loron, MD;  Location: [facility];  Service:  General;  Laterality: N/A;  58  - GEN AND TAP BLOCK LDOW   COLONOSCOPY  09/08   COLPORRHAPHY  05/2009   Dr. Edward Jolly   ENDOMETRIAL ABLATION  2006   Dr. Edward Jolly   INCONTINENCE SURGERY     sling for urinary incontinence   IR IMAGING GUIDED PORT INSERTION  09/01/2021   IR REMOVAL TUN ACCESS W/ PORT W/O FL MOD SED  10/29/2022   KYPHOPLASTY N/A 04/07/2021   Procedure: Thoracic 12 Vertebral body biopsy;  Surgeon: Barnett Abu, MD;  Location: [facility] OR;  Service: Neurosurgery;  Laterality: N/A;   LAPAROSCOPIC ENDOMETRIOSIS FULGURATION     ablation   LUMBAR LAMINECTOMY/DECOMPRESSION MICRODISCECTOMY N/A 07/31/2021   Procedure: Open Transpedicular biopsy of  Thoracic twelve;  Surgeon: Barnett Abu, MD;  Location: [facility] OR;  Service: Neurosurgery;  Laterality: N/A;   OOPHORECTOMY  2010   w/hyst--Dr. Edward Jolly   TUBAL LIGATION  2004   w/ C-section Dr. Edward Jolly   TYMPANOPLASTY     right ear   VAGINAL HYSTERECTOMY  05/21/2009   lap assisted /partial    Current Medications: Current Meds  Medication Sig   acetaminophen (TYLENOL) 500 MG tablet Take 1,000 mg by mouth every 6 (six) hours as needed for moderate pain or headache.   aspirin EC 81 MG tablet Take 1 tablet (81 mg total) by mouth daily. Swallow whole.   b complex vitamins capsule Take 1 capsule by mouth daily.   betamethasone valerate (VALISONE) 0.1 % cream Apply topically as needed.   cetirizine (ZYRTEC) 10 MG tablet Take 10 mg by mouth daily.    Cholecalciferol (VITAMIN D3) 125 MCG (5000 UT) CAPS Take 5,000 Units by mouth daily.   hydrOXYzine (ATARAX) 10 MG tablet Take 1 or 2 tablets by mouth as needed for anxiety attack.   losartan-hydrochlorothiazide (HYZAAR) 50-12.5 MG tablet TAKE 1 TABLET BY MOUTH DAILY FOR BLOOD PRESSURE   Methylcobalamin (B-12) 5000 MCG TBDP Take 5,000 mcg by mouth daily.   metoprolol succinate (TOPROL XL) 25 MG 24 hr tablet Take 1 tablet (25 mg total) by mouth daily.   rosuvastatin (CRESTOR) 5 MG tablet Take 1 tablet (5 mg total) by mouth every evening.   venlafaxine XR (EFFEXOR-XR) 75 MG 24 hr capsule Take 1 capsule (75 mg total) by mouth daily with breakfast. For hot flashes and anxiety     Allergies:   Buspar [buspirone hcl]   Social History   Socioeconomic History   Marital status: Married    Spouse name: Not on file   Number of children: 2   Years of education: Not on file   Highest education level: Not on file  Occupational History   Occupation: [employer]  Tobacco Use   Smoking status: Former    Current packs/day: 0.00    Types: Cigarettes    Start date: 01/05/1989    Quit date: 01/06/1999    Years since quitting: 24.5   Smokeless tobacco:  Never  Vaping Use   Vaping status: Never Used  Substance and Sexual Activity   Alcohol use: No    Alcohol/week: 0.0 standard drinks of alcohol   Drug use: No   Sexual activity: Yes    Partners: Male    Birth control/protection: Other-see comments, Surgical    Comment: TAH/LSO  Other Topics Concern   Not on file  Social History Narrative   ** Merged History Encounter **       Social Determinants of Health   Financial Resource Strain: Not on file  Food  Insecurity: Not on file  Transportation Needs: Not on file  Physical Activity: Not on file  Stress: Not on file  Social Connections: Unknown (02/23/2022)   Received from [facility], [facility]   Social Network    Social Network: Not on file     Family History: The patient's family history includes Breast cancer in her sister; Cancer - Colon in her maternal aunt; Colon cancer in her mother; Diabetes in her mother; Heart failure in her mother; Hypertension in her brother, mother, sister, sister, sister, and sister; Kidney cancer in her sister; Kidney disease in her mother; Rheum arthritis in her sister; Stroke in her father and mother. There is no history of Esophageal cancer, Rectal cancer, or Stomach cancer.  ROS:   Please see the history of present illness.     All other systems reviewed and are negative.  EKGs/Labs/Other Studies Reviewed:    The following studies were reviewed today:   EKG:   12/22/2022: Normal sinus rhythm, rate 94, no ST abnormalities 07/22/2023: Normal sinus rhythm, rate 88, no ST abnormalities  Recent Labs: 12/09/2022: Pro B Natriuretic peptide (BNP) 17.0 04/22/2023: ALT 25; BUN 15; Creatinine 0.78; Hemoglobin 13.1; Platelet Count 144; Potassium 4.7; Sodium 141 05/27/2023: TSH 1.47  Recent Lipid Panel    Component Value Date/Time   CHOL 145 06/08/2023 0916   TRIG 105 06/08/2023 0916   HDL 55 06/08/2023 0916   CHOLHDL 2.6 06/08/2023 0916   CHOLHDL 2 05/27/2023 1006   VLDL 15.6 05/27/2023  1006   LDLCALC 71 06/08/2023 0916    Physical Exam:    VS:  BP 122/70   Pulse 88   Ht 5\' 4"  (1.626 m)   Wt 258 lb (117 kg)   SpO2 95%   BMI 44.29 kg/m     Wt Readings from Last 3 Encounters:  07/22/23 258 lb (117 kg)  05/27/23 255 lb 12.8 oz (116 kg)  04/22/23 256 lb 14.4 oz (116.5 kg)     GEN:  Well nourished, well developed in no acute distress HEENT: Normal NECK: No JVD; No carotid bruits LYMPHATICS: No lymphadenopathy CARDIAC: RRR, no murmurs, rubs, gallops RESPIRATORY:  Clear to auscultation without rales, wheezing or rhonchi  ABDOMEN: Soft, non-tender, non-distended MUSCULOSKELETAL:  No edema; No deformity  SKIN: Warm and dry NEUROLOGIC:  Alert and oriented x 3 PSYCHIATRIC:  Normal affect   ASSESSMENT:    1. Coronary artery disease involving native coronary artery of native heart, unspecified whether angina present   2. Essential hypertension   3. OSA (obstructive sleep apnea)   4. Hyperlipidemia, unspecified hyperlipidemia type     PLAN:    CAD: Description of chest pain concerning for angina, as describes exertional chest tightness.  Coronary CTA on 01/04/2023 showed nonobstructive CAD with noncalcified plaque in mid RCA causing 25 to 49% stenosis and calcified plaque in left main causing less than 25% stenosis, calcium score 15 (78th percentile).  Echocardiogram 01/20/2023 showed normal biventricular function, grade 1 diastolic dysfunction, moderate mitral annular calcification, no significant valvular disease. -Given description of chest pain suggests typical angina with nonobstructive disease on coronary CTA, suspect microvascular disease.  Started on Toprol XL 25 mg daily.  Reports chest pain significantly improved -Developed myalgias with atorvastatin, switched to rosuvastatin 5 mg daily -Continue aspirin 81 mg daily  Hypertension: On losartan-HCTZ 50-12.5 mg daily and Toprol-XL 25 mg daily.  Appears controlled  Hyperlipidemia: LDL 105 on 12/01/2022.   Started atorvastatin 20 mg daily with improvement in LDL to 66  03/2023.  Developed myalgias and brain fog with atorvastatin, was discontinued.  Switch to rosuvastatin 5 mg daily, LDL 71 on 06/08/2023  Large B-cell lymphoma: Status post chemoradiation.  In remission.  OSA: Positive sleep study 06/04/2023, CPAP recommended but patient has declined.  Will refer to sleep medicine  Morbid obesity: Body mass index is 44.29 kg/m.  Discussed referred to healthy weight and wellness but she wishes to hold off at this time.  Will work on diet/exercise   RTC in 1 year   Medication Adjustments/Labs and Tests Ordered: Current medicines are reviewed at length with the patient today.  Concerns regarding medicines are outlined above.  Orders Placed This Encounter  Procedures   Ambulatory referral to Pulmonology   EKG 12-Lead   No orders of the defined types were placed in this encounter.   Patient Instructions  Medication Instructions:  No changes *If you need a refill on your cardiac medications before your next appointment, please call your pharmacy*  Follow-Up: At [facility], you and your health needs are our priority.  As part of our continuing mission to provide you with exceptional heart care, we have created designated Provider Care Teams.  These Care Teams include your primary Cardiologist (physician) and Advanced Practice Providers (APPs -  Physician Assistants and Nurse Practitioners) who all work together to provide you with the care you need, when you need it.  We recommend signing up for the patient portal called "MyChart".  Sign up information is provided on this After Visit Summary.  MyChart is used to connect with patients for Virtual Visits (Telemedicine).  Patients are able to view lab/test results, encounter notes, upcoming appointments, etc.  Non-urgent messages can be sent to your provider as well.   To learn more about what you can do with MyChart, go to  [email or web address].    Your next appointment:   1 year(s)  Provider:   Dr Bjorn Pippin    Signed, Little Ishikawa, MD  07/25/2023 9:51 PM    [facility]

## 2023-07-22 NOTE — Patient Instructions (Signed)
Medication Instructions:  No changes *If you need a refill on your cardiac medications before your next appointment, please call your pharmacy*  Follow-Up: At [facility], you and your health needs are our priority.  As part of our continuing mission to provide you with exceptional heart care, we have created designated Provider Care Teams.  These Care Teams include your primary Cardiologist (physician) and Advanced Practice Providers (APPs -  Physician Assistants and Nurse Practitioners) who all work together to provide you with the care you need, when you need it.  We recommend signing up for the patient portal called "MyChart".  Sign up information is provided on this After Visit Summary.  MyChart is used to connect with patients for Virtual Visits (Telemedicine).  Patients are able to view lab/test results, encounter notes, upcoming appointments, etc.  Non-urgent messages can be sent to your provider as well.   To learn more about what you can do with MyChart, go to [email or web address].    Your next appointment:   1 year(s)  Provider:   Dr Bjorn Pippin

## 2023-07-27 ENCOUNTER — Ambulatory Visit (AMBULATORY_SURGERY_CENTER): Payer: 59 | Admitting: *Deleted

## 2023-07-27 VITALS — Ht 64.0 in | Wt 257.0 lb

## 2023-07-27 DIAGNOSIS — Z860100 Personal history of colon polyps, unspecified: Secondary | ICD-10-CM

## 2023-07-27 DIAGNOSIS — Z800 Family history of malignant neoplasm of digestive organs: Secondary | ICD-10-CM

## 2023-07-27 MED ORDER — NA SULFATE-K SULFATE-MG SULF 17.5-3.13-1.6 GM/177ML PO SOLN: 1.0000 | Freq: Once | ORAL | 0 refills | Status: AC

## 2023-07-27 NOTE — Progress Notes (Signed)
Pt's name and DOB verified at the beginning of the pre-visit wit 2 identifiers  Pt denies any difficulty with ambulating,sitting, laying down or rolling side to side  Gave both [facility] main # and MD on call # prior to instructions.   No egg or soy allergy known to patient   No issues known to pt with past sedation with any surgeries or procedures  Pt denies having issues being intubated  Pt has no issues moving head neck or swallowing  No FH of Malignant Hyperthermia  Pt is not on diet pills or shots  Pt is not on home 02   Pt is not on blood thinners   Pt denies issues with constipation    Pt is not on dialysis  Pt has hx PVC's   Pt denies any upcoming cardiac testing  Pt encouraged to use to use Singlecare or Goodrx to reduce cost   Patient's chart reviewed by Cathlyn Parsons CNRA prior to pre-visit and patient appropriate for the [facility].  Pre-visit completed and red dot placed by patient's name on their procedure day (on provider's schedule).  .  Visit in person  Pt scale weight is 257 lb   Instructed pt why it is important to and  to call if they have any changes in health or new medications. Directed them to the # given and on instructions.     Instructions reviewed with pt and pt states understanding. Instructed to review again given to pt with coupon and by my chart

## 2023-08-02 ENCOUNTER — Encounter: Payer: Self-pay | Admitting: Internal Medicine

## 2023-08-07 ENCOUNTER — Encounter: Payer: Self-pay | Admitting: Certified Registered Nurse Anesthetist

## 2023-08-12 NOTE — Progress Notes (Signed)
[facility] History and Physical   Primary Care Physician:  Doreene Nest, NP   Reason for Procedure:   FHx CRCA multiple relatives  Plan:    colonoscopy     HPI: Virginia Oneal is a 57 y.o. female presenting for repeat screening colonoscopy   Past Medical History:  Diagnosis Date   Abnormal uterine bleeding    Anemia    as a teenager   Anxiety    postpartum and hx panic attacks   Arrhythmia    Arthritis    back and hands   CAD (coronary artery disease) 07/22/2023   Cancer (HCC)    B cell   Chest mass    by CT scanning   Chest pain 07/26/2014   Complication of anesthesia    slow to wake up   COVID-19 10/2020   Depression    DOE (dyspnea on exertion) 07/26/2014   Family history of adverse reaction to anesthesia    mom slow to wake up   Gestational diabetes    resolved after pregnancy   Gross hematuria 11/20/2020   History of kidney stones    Hypertension    Morbid (severe) obesity due to excess calories (HCC) 07/22/2023   bmi 44.29   Personal history of colonic polyps    PONV (postoperative nausea and vomiting)    PREMATURE ATRIAL CONTRACTIONS 08/15/2008   Qualifier: Diagnosis of  By: Hetty Ely MD, Franne Grip    RENAL CALCULUS, HX OF 08/15/2008   Qualifier: Diagnosis of  By: Lorrene Reid LPN, Wanda     Rotator cuff tendinitis 06/29/2011   Seasonal allergies    Sleep apnea    Urinary incontinence 05/2009   hx bladder sling--Dr. Edward Jolly    Past Surgical History:  Procedure Laterality Date   ABDOMINAL HYSTERECTOMY  05/2009   TAH/LSO   BLADDER SUSPENSION  2010   Dr. Edward Jolly   BREAST BIOPSY     left stereo   CESAREAN SECTION  03/2003   CESAREAN SECTION  2004   CHOLECYSTECTOMY N/A 01/02/2021   Procedure: LAPAROSCOPIC CHOLECYSTECTOMY;  Surgeon: Emelia Loron, MD;  Location: [facility];  Service: General;  Laterality: N/A;  58  - GEN AND TAP BLOCK LDOW   COLONOSCOPY  09/08   COLPORRHAPHY  05/2009   Dr. Edward Jolly   ENDOMETRIAL ABLATION  2006   Dr.  Edward Jolly   INCONTINENCE SURGERY     sling for urinary incontinence   IR IMAGING GUIDED PORT INSERTION  09/01/2021   IR REMOVAL TUN ACCESS W/ PORT W/O FL MOD SED  10/29/2022   KYPHOPLASTY N/A 04/07/2021   Procedure: Thoracic 12 Vertebral body biopsy;  Surgeon: Barnett Abu, MD;  Location: [facility] OR;  Service: Neurosurgery;  Laterality: N/A;   LAPAROSCOPIC ENDOMETRIOSIS FULGURATION     ablation   LUMBAR LAMINECTOMY/DECOMPRESSION MICRODISCECTOMY N/A 07/31/2021   Procedure: Open Transpedicular biopsy of Thoracic twelve;  Surgeon: Barnett Abu, MD;  Location: [facility] OR;  Service: Neurosurgery;  Laterality: N/A;   OOPHORECTOMY  2010   w/hyst--Dr. Edward Jolly   TUBAL LIGATION  2004   w/ C-section Dr. Edward Jolly   TYMPANOPLASTY     right ear   VAGINAL HYSTERECTOMY  05/21/2009   lap assisted /partial    Prior to Admission medications   Medication Sig Start Date End Date Taking? Authorizing Provider  acetaminophen (TYLENOL) 500 MG tablet Take 1,000 mg by mouth every 6 (six) hours as needed for moderate pain or headache.    [provider]  aspirin EC 81 MG tablet  Take 1 tablet (81 mg total) by mouth daily. Swallow whole. 04/06/23   Little Ishikawa, MD  b complex vitamins capsule Take 1 capsule by mouth daily. 09/25/21   Johney Maine, MD  betamethasone valerate (VALISONE) 0.1 % cream Apply topically as needed.    [provider]  cetirizine (ZYRTEC) 10 MG tablet Take 10 mg by mouth daily.  01/10/18   [provider]  Cholecalciferol (VITAMIN D3) 125 MCG (5000 UT) CAPS Take 5,000 Units by mouth daily.    [provider]  hydrOXYzine (ATARAX) 10 MG tablet Take 1 or 2 tablets by mouth as needed for anxiety attack. 06/08/22   Doreene Nest, NP  losartan-hydrochlorothiazide (HYZAAR) 50-12.5 MG tablet TAKE 1 TABLET BY MOUTH DAILY FOR BLOOD PRESSURE 06/04/23   Doreene Nest, NP  Methylcobalamin (B-12) 5000 MCG TBDP Take 5,000 mcg by mouth daily.    [provider]  metoprolol succinate (TOPROL XL) 25 MG 24 hr tablet Take 1 tablet (25 mg total) by mouth daily. 04/06/23   Little Ishikawa, MD  rosuvastatin (CRESTOR) 5 MG tablet Take 1 tablet (5 mg total) by mouth every evening. 04/06/23   Little Ishikawa, MD  venlafaxine XR (EFFEXOR-XR) 75 MG 24 hr capsule Take 1 capsule (75 mg total) by mouth daily with breakfast. For hot flashes and anxiety 05/27/23   Doreene Nest, NP    Current Outpatient Medications  Medication Sig Dispense Refill   acetaminophen (TYLENOL) 500 MG tablet Take 1,000 mg by mouth every 6 (six) hours as needed for moderate pain or headache.     aspirin EC 81 MG tablet Take 1 tablet (81 mg total) by mouth daily. Swallow whole. 90 tablet 3   b complex vitamins capsule Take 1 capsule by mouth daily.     cetirizine (ZYRTEC) 10 MG tablet Take 10 mg by mouth daily.      Cholecalciferol (VITAMIN D3) 125 MCG (5000 UT) CAPS Take 5,000 Units by mouth daily.     losartan-hydrochlorothiazide (HYZAAR) 50-12.5 MG tablet TAKE 1 TABLET BY MOUTH DAILY FOR BLOOD PRESSURE 90 tablet 3   Methylcobalamin (B-12) 5000 MCG TBDP Take 5,000 mcg by mouth daily.     metoprolol succinate (TOPROL XL) 25 MG 24 hr tablet Take 1 tablet (25 mg total) by mouth daily. 90 tablet 3   rosuvastatin (CRESTOR) 5 MG tablet Take 1 tablet (5 mg total) by mouth every evening. 90 tablet 3   venlafaxine XR (EFFEXOR-XR) 75 MG 24 hr capsule Take 1 capsule (75 mg total) by mouth daily with breakfast. For hot flashes and anxiety 90 capsule 0   betamethasone valerate (VALISONE) 0.1 % cream Apply topically as needed.     hydrOXYzine (ATARAX) 10 MG tablet Take 1 or 2 tablets by mouth as needed for anxiety attack. (Patient not taking: Reported on 08/13/2023) 15 tablet 0   Current Facility-Administered Medications  Medication Dose Route Frequency Provider Last Rate Last Admin   0.9 %  sodium chloride infusion  500 mL Intravenous Once Iva Boop, MD         Allergies as of 08/13/2023 - Review Complete 08/13/2023  Allergen Reaction Noted   Buspar [buspirone hcl]  07/08/2011    Family History  Problem Relation Age of Onset   Kidney disease Mother        renal failure   Stroke Mother    Diabetes Mother    Hypertension Mother    Heart failure Mother  Colon cancer Mother    Stroke Father    Hypertension Sister    Rheum arthritis Sister    Hypertension Sister    Hypertension Sister    Hypertension Sister    Breast cancer Sister    Kidney cancer Sister    Hypertension Brother    Cancer - Colon Maternal Aunt    Esophageal cancer Neg Hx    Rectal cancer Neg Hx    Stomach cancer Neg Hx    Colon polyps Neg Hx     Social History   Socioeconomic History   Marital status: Married    Spouse name: Not on file   Number of children: 2   Years of education: Not on file   Highest education level: Not on file  Occupational History   Occupation: [employer]  Tobacco Use   Smoking status: Former    Current packs/day: 0.00    Types: Cigarettes    Start date: 01/05/1989    Quit date: 01/06/1999    Years since quitting: 24.6   Smokeless tobacco: Never  Vaping Use   Vaping status: Never Used  Substance and Sexual Activity   Alcohol use: No    Alcohol/week: 0.0 standard drinks of alcohol   Drug use: No   Sexual activity: Yes    Partners: Male    Birth control/protection: Surgical    Comment: TAH/LSO  Other Topics Concern   Not on file  Social History Narrative   ** Merged History Encounter **       Social Determinants of Health   Financial Resource Strain: Not on file  Food Insecurity: Not on file  Transportation Needs: Not on file  Physical Activity: Not on file  Stress: Not on file  Social Connections: Unknown (02/23/2022)   Received from [facility], [facility]   Social Network    Social Network: Not on file  Intimate Partner Violence: Unknown (01/15/2022)   [facility] from [facility]    HITS    Physically Hurt: Not on file    Insult or Talk Down To: Not on file    Threaten Physical Harm: Not on file    Scream or Curse: Not on file    Review of Systems:  All other review of systems negative except as mentioned in the HPI.  Physical Exam: Vital signs BP 125/71   Pulse 87   Temp (!) 97.3 F (36.3 C) (Skin)   Ht 5\' 4"  (1.626 m)   Wt 257 lb (116.6 kg)   SpO2 95%   BMI 44.11 kg/m   General:   Alert,  Well-developed, well-nourished, pleasant and cooperative in NAD Lungs:  Clear throughout to auscultation.   Heart:  Regular rate and rhythm; no murmurs, clicks, rubs,  or gallops. Abdomen:  Soft, nontender and nondistended. Normal bowel sounds.   Neuro/Psych:  Alert and cooperative. Normal mood and affect. A and O x 3   @Nakyra Bourn  Sena Slate, MD, [facility] [phone] (pager) 08/13/2023 9:12 AM@

## 2023-08-13 ENCOUNTER — Encounter: Payer: Self-pay | Admitting: Internal Medicine

## 2023-08-13 ENCOUNTER — Ambulatory Visit (AMBULATORY_SURGERY_CENTER): Payer: 59 | Admitting: Internal Medicine

## 2023-08-13 VITALS — BP 110/66 | HR 75 | Temp 97.3°F | Resp 15 | Ht 64.0 in | Wt 257.0 lb

## 2023-08-13 DIAGNOSIS — D123 Benign neoplasm of transverse colon: Secondary | ICD-10-CM | POA: Diagnosis not present

## 2023-08-13 DIAGNOSIS — D120 Benign neoplasm of cecum: Secondary | ICD-10-CM | POA: Diagnosis not present

## 2023-08-13 DIAGNOSIS — Z800 Family history of malignant neoplasm of digestive organs: Secondary | ICD-10-CM | POA: Diagnosis not present

## 2023-08-13 DIAGNOSIS — Z1211 Encounter for screening for malignant neoplasm of colon: Secondary | ICD-10-CM | POA: Diagnosis present

## 2023-08-13 MED ORDER — SODIUM CHLORIDE 0.9 % IV SOLN: 500.0000 mL | Freq: Once | INTRAVENOUS | Status: DC

## 2023-08-13 NOTE — Progress Notes (Signed)
Pt's states no medical or surgical changes since previsit or office visit. 

## 2023-08-13 NOTE — Patient Instructions (Addendum)
I found and removed 2 tiny polyps today.  I will let you know pathology results and when to have another routine colonoscopy by mail and/or My Chart. Likely still 5 year follow-up.  I appreciate the opportunity to care for you. Iva Boop, MD, [facility]  Discharge instructions given. Handout on polyps. Resume previous medications. YOU HAD AN ENDOSCOPIC PROCEDURE TODAY AT THE [facility]:   Refer to the procedure report that was given to you for any specific questions about what was found during the examination.  If the procedure report does not answer your questions, please call your gastroenterologist to clarify.  If you requested that your care partner not be given the details of your procedure findings, then the procedure report has been included in a sealed envelope for you to review at your convenience later.  YOU SHOULD EXPECT: Some feelings of bloating in the abdomen. Passage of more gas than usual.  Walking can help get rid of the air that was put into your GI tract during the procedure and reduce the bloating. If you had a lower endoscopy (such as a colonoscopy or flexible sigmoidoscopy) you may notice spotting of blood in your stool or on the toilet paper. If you underwent a bowel prep for your procedure, you may not have a normal bowel movement for a few days.  Please Note:  You might notice some irritation and congestion in your nose or some drainage.  This is from the oxygen used during your procedure.  There is no need for concern and it should clear up in a day or so.  SYMPTOMS TO REPORT IMMEDIATELY:  Following lower endoscopy (colonoscopy or flexible sigmoidoscopy):  Excessive amounts of blood in the stool  Significant tenderness or worsening of abdominal pains  Swelling of the abdomen that is new, acute  Fever of 100F or higher   For urgent or emergent issues, a gastroenterologist can be reached at any hour by calling [phone]. Do not use MyChart  messaging for urgent concerns.    DIET:  We do recommend a small meal at first, but then you may proceed to your regular diet.  Drink plenty of fluids but you should avoid alcoholic beverages for 24 hours.  ACTIVITY:  You should plan to take it easy for the rest of today and you should NOT DRIVE or use heavy machinery until tomorrow (because of the sedation medicines used during the test).    FOLLOW UP: Our staff will call the number listed on your records the next business day following your procedure.  We will call around 7:15- 8:00 am to check on you and address any questions or concerns that you may have regarding the information given to you following your procedure. If we do not reach you, we will leave a message.     If any biopsies were taken you will be contacted by phone or by letter within the next 1-3 weeks.  Please call us at [phone] if you have not heard about the biopsies in 3 weeks.    SIGNATURES/CONFIDENTIALITY: You and/or your care partner have signed paperwork which will be entered into your electronic medical record.  These signatures attest to the fact that that the information above on your After Visit Summary has been reviewed and is understood.  Full responsibility of the confidentiality of this discharge information lies with you and/or your care-partner.

## 2023-08-13 NOTE — Progress Notes (Signed)
Report given to PACU, vss 

## 2023-08-13 NOTE — Op Note (Signed)
[facility] Patient Name: Virginia Oneal Procedure Date: 08/13/2023 9:13 AM MRN: [id number] Endoscopist: Iva Boop , MD, [id number] Age: 57 Referring MD:  Date of Birth: 06/17/1966 Gender: Female Account #: [id number] Procedure:                Colonoscopy Indications:              Screening in patient at increased risk: Family                            history of 1st-degree relative with colorectal                            cancer - mother + 2 maternal aunts Medicines:                Monitored Anesthesia Care Procedure:                Pre-Anesthesia Assessment:                           - Prior to the procedure, a History and Physical                            was performed, and patient medications and                            allergies were reviewed. The patient's tolerance of                            previous anesthesia was also reviewed. The risks                            and benefits of the procedure and the sedation                            options and risks were discussed with the patient.                            All questions were answered, and informed consent                            was obtained. Prior Anticoagulants: The patient has                            taken no anticoagulant or antiplatelet agents. ASA                            Grade Assessment: III - A patient with severe                            systemic disease. After reviewing the risks and                            benefits, the patient was deemed in satisfactory  condition to undergo the procedure.                           After obtaining informed consent, the colonoscope                            was passed under direct vision. Throughout the                            procedure, the patient's blood pressure, pulse, and                            oxygen saturations were monitored continuously. The                            Olympus Scope SN: [id number]  was introduced through                            the anus and advanced to the the cecum, identified                            by appendiceal orifice and ileocecal valve. The                            colonoscopy was somewhat difficult due to a                            redundant colon. Successful completion of the                            procedure was aided by applying abdominal pressure                            and abdominal binder application. The patient                            tolerated the procedure well. The quality of the                            bowel preparation was excellent. The ileocecal                            valve, appendiceal orifice, and rectum were                            photographed. The bowel preparation used was SUPREP                            via split dose instruction. Scope In: 9:26:16 AM Scope Out: 9:41:53 AM Scope Withdrawal Time: 0 hours 11 minutes 49 seconds  Total Procedure Duration: 0 hours 15 minutes 37 seconds  Findings:                 The perianal and digital rectal examinations were  normal.                           Two sessile polyps were found in the ascending                            colon and cecum. The polyps were diminutive in                            size. These polyps were removed with a cold snare.                            Resection and retrieval were complete. Verification                            of patient identification for the specimen was                            done. Estimated blood loss was minimal.                           The exam was otherwise without abnormality on                            direct and retroflexion views. Complications:            No immediate complications. Estimated Blood Loss:     Estimated blood loss was minimal. Impression:               - Two diminutive polyps in the ascending colon and                            in the cecum, removed with a cold  snare. Resected                            and retrieved.                           - The examination was otherwise normal on direct                            and retroflexion views exceptr for redundant colon.                           Family Hx CRCA motehr and 2 maternal aunts Recommendation:           - Patient has a contact number available for                            emergencies. The signs and symptoms of potential                            delayed complications were discussed with the  patient. Return to normal activities tomorrow.                            Written discharge instructions were provided to the                            patient.                           - Resume previous diet.                           - Continue present medications.                           - Repeat colonoscopy is recommended. The                            colonoscopy date will be determined after pathology                            results from today's exam become available for                            review. Likely 5 years. would apply abdominal                            binder again to facilitate scope passage w/                            redundant colon. Iva Boop, MD 08/13/2023 9:50:17 AM This report has been signed electronically.

## 2023-08-13 NOTE — Progress Notes (Signed)
Called to room to assist during endoscopic procedure.  Patient ID and intended procedure confirmed with present staff. Received instructions for my participation in the procedure from the performing physician.  

## 2023-08-16 ENCOUNTER — Telehealth: Payer: Self-pay | Admitting: *Deleted

## 2023-08-16 NOTE — Telephone Encounter (Signed)
  Follow up Call-     08/13/2023    8:43 AM  Call back number  Post procedure Call Back phone  # [phone]  Permission to leave phone message Yes     Patient questions:  Do you have a fever, pain , or abdominal swelling? No. Pain Score  0 *  Have you tolerated food without any problems? Yes.    Have you been able to return to your normal activities? Yes.    Do you have any questions about your discharge instructions: Diet   No. Medications  No. Follow up visit  No.  Do you have questions or concerns about your Care? No.  Actions: * If pain score is 4 or above: No action needed, pain <4.

## 2023-08-17 LAB — SURGICAL PATHOLOGY

## 2023-08-19 ENCOUNTER — Encounter: Payer: Self-pay | Admitting: Internal Medicine

## 2023-08-23 ENCOUNTER — Other Ambulatory Visit: Payer: Self-pay

## 2023-08-23 DIAGNOSIS — C8330 Diffuse large B-cell lymphoma, unspecified site: Secondary | ICD-10-CM

## 2023-08-24 ENCOUNTER — Encounter: Payer: Self-pay | Admitting: Hematology

## 2023-08-24 ENCOUNTER — Inpatient Hospital Stay (HOSPITAL_BASED_OUTPATIENT_CLINIC_OR_DEPARTMENT_OTHER): Payer: 59 | Admitting: Hematology

## 2023-08-24 ENCOUNTER — Inpatient Hospital Stay: Payer: 59 | Attending: Hematology

## 2023-08-24 ENCOUNTER — Other Ambulatory Visit: Payer: Self-pay | Admitting: Primary Care

## 2023-08-24 VITALS — BP 147/82 | HR 88 | Temp 97.5°F | Resp 20 | Wt 260.5 lb

## 2023-08-24 DIAGNOSIS — M129 Arthropathy, unspecified: Secondary | ICD-10-CM | POA: Diagnosis not present

## 2023-08-24 DIAGNOSIS — I2510 Atherosclerotic heart disease of native coronary artery without angina pectoris: Secondary | ICD-10-CM | POA: Diagnosis not present

## 2023-08-24 DIAGNOSIS — Z800 Family history of malignant neoplasm of digestive organs: Secondary | ICD-10-CM | POA: Insufficient documentation

## 2023-08-24 DIAGNOSIS — R0602 Shortness of breath: Secondary | ICD-10-CM | POA: Diagnosis not present

## 2023-08-24 DIAGNOSIS — Z87442 Personal history of urinary calculi: Secondary | ICD-10-CM | POA: Insufficient documentation

## 2023-08-24 DIAGNOSIS — D701 Agranulocytosis secondary to cancer chemotherapy: Secondary | ICD-10-CM | POA: Diagnosis not present

## 2023-08-24 DIAGNOSIS — Z8616 Personal history of COVID-19: Secondary | ICD-10-CM | POA: Insufficient documentation

## 2023-08-24 DIAGNOSIS — R232 Flushing: Secondary | ICD-10-CM

## 2023-08-24 DIAGNOSIS — C8330 Diffuse large B-cell lymphoma, unspecified site: Secondary | ICD-10-CM | POA: Diagnosis present

## 2023-08-24 DIAGNOSIS — Z7982 Long term (current) use of aspirin: Secondary | ICD-10-CM | POA: Insufficient documentation

## 2023-08-24 DIAGNOSIS — G4730 Sleep apnea, unspecified: Secondary | ICD-10-CM | POA: Insufficient documentation

## 2023-08-24 DIAGNOSIS — K760 Fatty (change of) liver, not elsewhere classified: Secondary | ICD-10-CM | POA: Diagnosis not present

## 2023-08-24 DIAGNOSIS — I10 Essential (primary) hypertension: Secondary | ICD-10-CM | POA: Insufficient documentation

## 2023-08-24 DIAGNOSIS — N200 Calculus of kidney: Secondary | ICD-10-CM | POA: Insufficient documentation

## 2023-08-24 DIAGNOSIS — Z79899 Other long term (current) drug therapy: Secondary | ICD-10-CM | POA: Diagnosis not present

## 2023-08-24 DIAGNOSIS — Z87891 Personal history of nicotine dependence: Secondary | ICD-10-CM | POA: Insufficient documentation

## 2023-08-24 DIAGNOSIS — Z860100 Personal history of colon polyps, unspecified: Secondary | ICD-10-CM | POA: Diagnosis not present

## 2023-08-24 LAB — CBC WITH DIFFERENTIAL (CANCER CENTER ONLY)
Abs Immature Granulocytes: 0.01 10*3/uL (ref 0.00–0.07)
Basophils Absolute: 0 10*3/uL (ref 0.0–0.1)
Basophils Relative: 0 %
Eosinophils Absolute: 0.1 10*3/uL (ref 0.0–0.5)
Eosinophils Relative: 2 %
HCT: 39.8 % (ref 36.0–46.0)
Hemoglobin: 13.2 g/dL (ref 12.0–15.0)
Immature Granulocytes: 0 %
Lymphocytes Relative: 19 %
Lymphs Abs: 1.2 10*3/uL (ref 0.7–4.0)
MCH: 30.9 pg (ref 26.0–34.0)
MCHC: 33.2 g/dL (ref 30.0–36.0)
MCV: 93.2 fL (ref 80.0–100.0)
Monocytes Absolute: 0.4 10*3/uL (ref 0.1–1.0)
Monocytes Relative: 7 %
Neutro Abs: 4.4 10*3/uL (ref 1.7–7.7)
Neutrophils Relative %: 72 %
Platelet Count: 143 10*3/uL — ABNORMAL LOW (ref 150–400)
RBC: 4.27 MIL/uL (ref 3.87–5.11)
RDW: 12.5 % (ref 11.5–15.5)
WBC Count: 6.2 10*3/uL (ref 4.0–10.5)
nRBC: 0 % (ref 0.0–0.2)

## 2023-08-24 LAB — CMP (CANCER CENTER ONLY)
ALT: 26 U/L (ref 0–44)
AST: 19 U/L (ref 15–41)
Albumin: 4 g/dL (ref 3.5–5.0)
Alkaline Phosphatase: 98 U/L (ref 38–126)
Anion gap: 5 (ref 5–15)
BUN: 15 mg/dL (ref 6–20)
CO2: 31 mmol/L (ref 22–32)
Calcium: 9.4 mg/dL (ref 8.9–10.3)
Chloride: 105 mmol/L (ref 98–111)
Creatinine: 0.77 mg/dL (ref 0.44–1.00)
GFR, Estimated: >60 mL/min (ref >60)
Glucose, Bld: 120 mg/dL — ABNORMAL HIGH (ref 70–99)
Potassium: 4.1 mmol/L (ref 3.5–5.1)
Sodium: 141 mmol/L (ref 135–145)
Total Bilirubin: 0.4 mg/dL (ref <1.2)
Total Protein: 6.4 g/dL — ABNORMAL LOW (ref 6.5–8.1)

## 2023-08-24 LAB — LACTATE DEHYDROGENASE: LDH: 186 U/L (ref 98–192)

## 2023-08-24 NOTE — Progress Notes (Signed)
HEMATOLOGY/ONCOLOGY CLINIC NOTE  Date of Service: 08/24/23  Patient Care Team: Doreene Nest, NP as PCP - General (Internal Medicine) Barnett Abu, MD as Consulting Physician (Neurosurgery) Emelia Loron, MD as Consulting Physician (General Surgery)  CHIEF COMPLAINTS/PURPOSE OF CONSULTATION:  Follow-up for continued evaluation and management of large B-cell lymphoma   HISTORY OF PRESENTING ILLNESS:  Please see previous note for details of initial presentation  INTERVAL HISTORY:  Virginia Oneal is a 57 y.o. female is here for follow-up of her diffuse large B-cell lymphoma.  Patient was last seen by me on 04/22/2023 and she complained of SOB when walking, lower back pain, and hot flashes. She was taking Venlafaxine, which controlled her hot flashes.   Patient notes she has been doing well overall since our last visit. She complains of hot flashes and has been taking Venlafaxine. She also complains of lower back pain, which has been the same since our last visit.   She denies any new infection issues, fever, chills, night sweats, unexpected weight loss, chest pain, abdominal pain, or leg swelling.   Patient's 2 year mark of treatment completion will be in March.   Patient notes that her recent colonoscopy showed 2 pre-cancerous polyps, which were removed.    MEDICAL HISTORY:  Past Medical History:  Diagnosis Date   Abnormal uterine bleeding    Anemia    as a teenager   Anxiety    postpartum and hx panic attacks   Arrhythmia    Arthritis    back and hands   CAD (coronary artery disease) 07/22/2023   Cancer (HCC)    B cell   Chest mass    by CT scanning   Chest pain 07/26/2014   Complication of anesthesia    slow to wake up   COVID-19 10/2020   Depression    DOE (dyspnea on exertion) 07/26/2014   Family history of adverse reaction to anesthesia    mom slow to wake up   Gestational diabetes    resolved after pregnancy   Gross hematuria 11/20/2020    History of kidney stones    Hypertension    Morbid (severe) obesity due to excess calories (HCC) 07/22/2023   bmi 44.29   Personal history of colonic polyps    PONV (postoperative nausea and vomiting)    PREMATURE ATRIAL CONTRACTIONS 08/15/2008   Qualifier: Diagnosis of  By: Hetty Ely MD, Franne Grip    RENAL CALCULUS, HX OF 08/15/2008   Qualifier: Diagnosis of  By: Lorrene Reid LPN, Wanda     Rotator cuff tendinitis 06/29/2011   Seasonal allergies    Sleep apnea    Urinary incontinence 05/2009   hx bladder sling--Dr. Edward Jolly    SURGICAL HISTORY: Past Surgical History:  Procedure Laterality Date   ABDOMINAL HYSTERECTOMY  05/2009   TAH/LSO   BLADDER SUSPENSION  2010   Dr. Edward Jolly   BREAST BIOPSY     left stereo   CESAREAN SECTION  03/2003   CESAREAN SECTION  2004   CHOLECYSTECTOMY N/A 01/02/2021   Procedure: LAPAROSCOPIC CHOLECYSTECTOMY;  Surgeon: Emelia Loron, MD;  Location: [facility];  Service: General;  Laterality: N/A;  58  - GEN AND TAP BLOCK LDOW   COLONOSCOPY  09/08   COLPORRHAPHY  05/2009   Dr. Edward Jolly   ENDOMETRIAL ABLATION  2006   Dr. Edward Jolly   INCONTINENCE SURGERY     sling for urinary incontinence   IR IMAGING GUIDED PORT INSERTION  09/01/2021   IR REMOVAL TUN ACCESS  W/ PORT W/O FL MOD SED  10/29/2022   KYPHOPLASTY N/A 04/07/2021   Procedure: Thoracic 12 Vertebral body biopsy;  Surgeon: Barnett Abu, MD;  Location: [facility] OR;  Service: Neurosurgery;  Laterality: N/A;   LAPAROSCOPIC ENDOMETRIOSIS FULGURATION     ablation   LUMBAR LAMINECTOMY/DECOMPRESSION MICRODISCECTOMY N/A 07/31/2021   Procedure: Open Transpedicular biopsy of Thoracic twelve;  Surgeon: Barnett Abu, MD;  Location: [facility] OR;  Service: Neurosurgery;  Laterality: N/A;   OOPHORECTOMY  2010   w/hyst--Dr. Edward Jolly   TUBAL LIGATION  2004   w/ C-section Dr. Edward Jolly   TYMPANOPLASTY     right ear   VAGINAL HYSTERECTOMY  05/21/2009   lap assisted /partial    SOCIAL HISTORY: Social History   Socioeconomic History    Marital status: Married    Spouse name: Not on file   Number of children: 2   Years of education: Not on file   Highest education level: Not on file  Occupational History   Occupation: [employer]  Tobacco Use   Smoking status: Former    Current packs/day: 0.00    Types: Cigarettes    Start date: 01/05/1989    Quit date: 01/06/1999    Years since quitting: 24.6   Smokeless tobacco: Never  Vaping Use   Vaping status: Never Used  Substance and Sexual Activity   Alcohol use: No    Alcohol/week: 0.0 standard drinks of alcohol   Drug use: No   Sexual activity: Yes    Partners: Male    Birth control/protection: Surgical    Comment: TAH/LSO  Other Topics Concern   Not on file  Social History Narrative   ** Merged History Encounter **       Social Determinants of Health   Financial Resource Strain: Not on file  Food Insecurity: Not on file  Transportation Needs: Not on file  Physical Activity: Not on file  Stress: Not on file  Social Connections: Unknown (02/23/2022)   Received from [facility], [facility]   Social Network    Social Network: Not on file  Intimate Partner Violence: Unknown (01/15/2022)   Received from [employer], [facility]   HITS    Physically Hurt: Not on file    Insult or Talk Down To: Not on file    Threaten Physical Harm: Not on file    Scream or Curse: Not on file    FAMILY HISTORY: Family History  Problem Relation Age of Onset   Kidney disease Mother        renal failure   Stroke Mother    Diabetes Mother    Hypertension Mother    Heart failure Mother    Colon cancer Mother    Stroke Father    Hypertension Sister    Rheum arthritis Sister    Hypertension Sister    Hypertension Sister    Hypertension Sister    Breast cancer Sister    Kidney cancer Sister    Hypertension Brother    Cancer - Colon Maternal Aunt    Esophageal cancer Neg Hx    Rectal cancer Neg Hx    Stomach cancer Neg Hx    Colon polyps Neg Hx      ALLERGIES:  is allergic to buspar [buspirone hcl].  MEDICATIONS:  Current Outpatient Medications  Medication Sig Dispense Refill   acetaminophen (TYLENOL) 500 MG tablet Take 1,000 mg by mouth every 6 (six) hours as needed for moderate pain or headache.     aspirin EC  81 MG tablet Take 1 tablet (81 mg total) by mouth daily. Swallow whole. 90 tablet 3   b complex vitamins capsule Take 1 capsule by mouth daily.     betamethasone valerate (VALISONE) 0.1 % cream Apply topically as needed.     cetirizine (ZYRTEC) 10 MG tablet Take 10 mg by mouth daily.      Cholecalciferol (VITAMIN D3) 125 MCG (5000 UT) CAPS Take 5,000 Units by mouth daily.     hydrOXYzine (ATARAX) 10 MG tablet Take 1 or 2 tablets by mouth as needed for anxiety attack. (Patient not taking: Reported on 08/13/2023) 15 tablet 0   losartan-hydrochlorothiazide (HYZAAR) 50-12.5 MG tablet TAKE 1 TABLET BY MOUTH DAILY FOR BLOOD PRESSURE 90 tablet 3   Methylcobalamin (B-12) 5000 MCG TBDP Take 5,000 mcg by mouth daily.     metoprolol succinate (TOPROL XL) 25 MG 24 hr tablet Take 1 tablet (25 mg total) by mouth daily. 90 tablet 3   rosuvastatin (CRESTOR) 5 MG tablet Take 1 tablet (5 mg total) by mouth every evening. 90 tablet 3   venlafaxine XR (EFFEXOR-XR) 75 MG 24 hr capsule TAKE 1 CAPSULE (75 MG TOTAL) BY MOUTH DAILY WITH BREAKFAST. FOR HOT FLASHES AND ANXIETY 90 capsule 2   No current facility-administered medications for this visit.    REVIEW OF SYSTEMS:    10 Point review of Systems was done is negative except as noted above.   PHYSICAL EXAMINATION: .BP (!) 147/82   Pulse 88   Temp (!) 97.5 F (36.4 C)   Resp 20   Wt 260 lb 8 oz (118.2 kg)   SpO2 97%   BMI 44.71 kg/m  GENERAL:alert, in no acute distress and comfortable SKIN: no acute rashes, no significant lesions EYES: conjunctiva are pink and non-injected, sclera anicteric OROPHARYNX: MMM, no exudates, no oropharyngeal erythema or ulceration NECK: supple, no  JVD LYMPH:  no palpable lymphadenopathy in the cervical, axillary or inguinal regions LUNGS: clear to auscultation b/l with normal respiratory effort HEART: regular rate & rhythm ABDOMEN:  normoactive bowel sounds , non tender, not distended. Extremity: no pedal edema PSYCH: alert & oriented x 3 with fluent speech NEURO: no focal motor/sensory deficits   LABORATORY DATA:  I have reviewed the data as listed  .    Latest Ref Rng & Units 08/24/2023    9:34 AM 04/22/2023    9:41 AM 01/18/2023   10:03 AM  CBC  WBC 4.0 - 10.5 K/uL 6.2  6.1  6.3   Hemoglobin 12.0 - 15.0 g/dL 40.9  81.1  91.4   Hematocrit 36.0 - 46.0 % 39.8  39.5  39.9   Platelets 150 - 400 K/uL 143  144  150    .    Latest Ref Rng & Units 08/24/2023    9:34 AM 04/22/2023    9:41 AM 01/18/2023   10:03 AM  CMP  Glucose 70 - 99 mg/dL 782  956  213   BUN 6 - 20 mg/dL 15  15  18    Creatinine 0.44 - 1.00 mg/dL 0.86  5.78  4.69   Sodium 135 - 145 mmol/L 141  141  141   Potassium 3.5 - 5.1 mmol/L 4.1  4.7  4.0   Chloride 98 - 111 mmol/L 105  105  103   CO2 22 - 32 mmol/L 31  31  32   Calcium 8.9 - 10.3 mg/dL 9.4  9.8  62.9   Total Protein 6.5 - 8.1 g/dL 6.4  6.6  6.8   Total Bilirubin <1.2 mg/dL 0.4  0.4  0.6   Alkaline Phos 38 - 126 U/L 98  96  89   AST 15 - 41 U/L 19  20  21    ALT 0 - 44 U/L 26  25  27     Lab Results  Component Value Date   LDH 186 08/24/2023    HIV Screen 4th Generation wRfx     Non Reactive Non Reactive  Hepatitis B Surface Ag     NON REACTIVE NON REACTIVE  Hep B Core Total Ab     NON REACTIVE NON REACTIVE  HCV Ab     NON REACTIVE NON REACTIVE  LDH     98 - 192 U/L 228 (H)    SURGICAL PATHOLOGY  CASE: [id number]  PATIENT: Virginia Oneal  Surgical Pathology Report   Clinical History: lesion of thoracic vertebra (cm)   FINAL MICROSCOPIC DIAGNOSIS:   A-C. BONE, T12 VERTEBRAL BODY AND MARROW, BIOPSY:  -  Non-Hodgkin B-cell lymphoma, high grade  -  See comment   COMMENT:   A-C.   The biopsies show normal hematopoeisis admixed with crush artifact  with associated marrow fibrosis and an atypical cellular infiltrate  composed of medium to large cells with irregular nuclear contours, ample  cytoplasm, variably prominent nucleoli, increased mitoses and apoptosis.  An initial panel of immunohistochemistry (cytokeratin AE1/3, cytokeratin  7, cytokeratin 20, CD20 and CD3) confirmed the morphologic impression  that the atypical cells were lymphoid in nature.  The atypical  lymphocytes are positive for Pax-5, CD20, CD10, bcl-6 but negative for  CD30, bcl-2, mum-1 and EBV by in-situ hybridization. CD3 and CD5  highlight background T-cells.  The proliferative rate by ki-67 is  increased (50-60%).  Overall, the findings are consistent with a  non-Hodgkin B-cell lymphoma, high grade with a germinal center  phenotype.   FISH studies are pending and will be reported in an  addendum.  These findings were discussed with Dr. Danielle Dess on August 06, 2021.    RADIOGRAPHIC STUDIES: I have personally reviewed the radiological images as listed and agreed with the findings in the report.  ECHO 09/08/2021 1.Left ventricular ejection fraction, by estimation, is 55 to 60%. The left ventricle has normal function. The left ventricle has no regional wall motion abnormalities. There ismild left ventricular hypertrophy. Left ventricular diastolic parameters were normal.The average left ventricular global longitudinal strain is -20.4 %. The global longitudinal strain is normal. 2. Right ventricular systolic function is normal. The right ventricular size is normal. 3. Left atrial size was mildly dilated. The mitral valve is normal in structure. Trivial mitral valve regurgitation. No evidence of mitral stenosis. Moderate mitral annular calcification. 4.The aortic valve is normal in structure. Aortic valve regurgitation is not visualized. No aortic stenosis is present. 5.The inferior vena cava is  normal in size with greater than 50% respiratory variability, suggesting right atrial pressure of 3 mmHg.  PET/CT scan 09/09/2022  IMPRESSION: 1. No specific findings of active malignancy. Previous low-grade activity in the T12 vertebral body has resolved. 2. Focal accentuated activity in the distal esophagus with maximum SUV 4.4, likely physiologic, no CT correlate or significant accentuated activity on the prior exam in this region. 3. Hepatic steatosis. 4. Nonobstructive bilateral nephrolithiasis. 5. Aortic and coronary atherosclerosis.  Mitral valve calcification.   ASSESSMENT & PLAN:   57 year old female with  1) Stage IVA high-grade large B cell non-Hodgkin's lymphoma.  Germinal center phenotype.  C-Myc not rearranged.  Ki-67 50 to 60% No overt constitutional symptoms PET/CT with involvement of thoracic spine and acromion process on the right.  Abdominal lymphadenopathy.  Hepatitis testing unremarkable HIV nonreactive Myeloma panel shows no M spike protein spike.  Echocardiogram shows normal ejection fraction  PET CT scan done on 09/02/2021 shows Scattered mild hypermetabolism within subcentimeter periportal, retroperitoneal and mesenteric lymph nodes, compatible with the given history of large B-cell lymphoma. 2. Hypermetabolism within known T7 and T12 lesions, better seen on MR thoracic spine 07/04/2021. 3. Focal hypermetabolism in the acromion of the right scapula, without a definite CT correlate. Recommend attention on follow-up.  2) Chemotherapy related neutropenia -now resolved.  No issues with infections.    3)h/o  G-CSF related significant bone pain.  PLAN: -No indication of recurrence/progression of lymphoma.  -Answered all of patient's questions. -Discussed lab results from today, 08/24/2023, in detail with the patient. CBC shows slightly decreased platelet counts of 143 K. CMP stable.  -Discussed the option of CT scan in 3-4 months before our next visit since  March which will be 2 year mark of treatment completion. Pt agrees.  -Recommend influenza vaccine, COVID-19 Booster, and other age related vaccines.  -Schedule patient for CT scan in 3-4 months before our next visit.   FOLLOW-UP: Ct chest/abd/pelvis in 12 weeks RTC with Dr Candise Che with labs in 14 weeks   The total time spent in the appointment was 23 minutes* .  All of the patient's questions were answered with apparent satisfaction. The patient knows to call the clinic with any problems, questions or concerns.   Wyvonnia Lora MD MS AAHIVMS [facility] Hematology/Oncology Physician [facility]  .*Total Encounter Time as defined by the Centers for Medicare and Medicaid Services includes, in addition to the face-to-face time of a patient visit (documented in the note above) non-face-to-face time: obtaining and reviewing outside history, ordering and reviewing medications, tests or procedures, care coordination (communications with other health care professionals or caregivers) and documentation in the medical record.   I,Param Shah,acting as a Neurosurgeon for Wyvonnia Lora, MD.,have documented all relevant documentation on the behalf of Wyvonnia Lora, MD,as directed by  Wyvonnia Lora, MD while in the presence of Wyvonnia Lora, MD.  .I have reviewed the above documentation for accuracy and completeness, and I agree with the above. Johney Maine MD

## 2023-08-26 ENCOUNTER — Telehealth: Payer: Self-pay | Admitting: Hematology

## 2023-08-26 NOTE — Telephone Encounter (Signed)
Patient is aware of scheduled appointment times/dates

## 2023-08-30 ENCOUNTER — Encounter: Payer: Self-pay | Admitting: Hematology

## 2023-11-10 ENCOUNTER — Ambulatory Visit (INDEPENDENT_AMBULATORY_CARE_PROVIDER_SITE_OTHER): Payer: 59

## 2023-11-10 DIAGNOSIS — Z23 Encounter for immunization: Secondary | ICD-10-CM

## 2023-11-16 ENCOUNTER — Ambulatory Visit (HOSPITAL_COMMUNITY)
Admission: RE | Admit: 2023-11-16 | Discharge: 2023-11-16 | Disposition: A | Discharge status: Home / Self Care | Payer: 59 | Source: Ambulatory Visit | Attending: Hematology | Admitting: Hematology

## 2023-11-16 ENCOUNTER — Encounter (HOSPITAL_COMMUNITY): Payer: Self-pay

## 2023-11-16 DIAGNOSIS — C8330 Diffuse large B-cell lymphoma, unspecified site: Secondary | ICD-10-CM | POA: Diagnosis present

## 2023-11-16 MED ORDER — IOHEXOL 300 MG/ML  SOLN
30.0000 mL | Freq: Once | INTRAMUSCULAR | Status: AC | PRN
  Administered 2023-11-16: 30 mL via ORAL

## 2023-11-16 MED ORDER — IOHEXOL 300 MG/ML  SOLN
100.0000 mL | Freq: Once | INTRAMUSCULAR | Status: AC | PRN
  Administered 2023-11-16: 100 mL via INTRAVENOUS

## 2023-11-23 ENCOUNTER — Other Ambulatory Visit: Payer: Self-pay

## 2023-11-23 DIAGNOSIS — C8330 Diffuse large B-cell lymphoma, unspecified site: Secondary | ICD-10-CM

## 2023-11-24 ENCOUNTER — Inpatient Hospital Stay: Payer: 59 | Attending: Hematology

## 2023-11-24 ENCOUNTER — Inpatient Hospital Stay (HOSPITAL_BASED_OUTPATIENT_CLINIC_OR_DEPARTMENT_OTHER): Payer: 59 | Admitting: Hematology

## 2023-11-24 VITALS — BP 138/83 | HR 87 | Temp 97.3°F | Resp 16 | Wt 267.4 lb

## 2023-11-24 DIAGNOSIS — E669 Obesity, unspecified: Secondary | ICD-10-CM | POA: Diagnosis not present

## 2023-11-24 DIAGNOSIS — Z8616 Personal history of COVID-19: Secondary | ICD-10-CM | POA: Diagnosis not present

## 2023-11-24 DIAGNOSIS — Z87442 Personal history of urinary calculi: Secondary | ICD-10-CM | POA: Diagnosis not present

## 2023-11-24 DIAGNOSIS — D649 Anemia, unspecified: Secondary | ICD-10-CM | POA: Diagnosis not present

## 2023-11-24 DIAGNOSIS — I10 Essential (primary) hypertension: Secondary | ICD-10-CM | POA: Diagnosis not present

## 2023-11-24 DIAGNOSIS — N200 Calculus of kidney: Secondary | ICD-10-CM | POA: Diagnosis not present

## 2023-11-24 DIAGNOSIS — Z7982 Long term (current) use of aspirin: Secondary | ICD-10-CM | POA: Insufficient documentation

## 2023-11-24 DIAGNOSIS — M129 Arthropathy, unspecified: Secondary | ICD-10-CM | POA: Diagnosis not present

## 2023-11-24 DIAGNOSIS — K760 Fatty (change of) liver, not elsewhere classified: Secondary | ICD-10-CM | POA: Insufficient documentation

## 2023-11-24 DIAGNOSIS — R232 Flushing: Secondary | ICD-10-CM | POA: Diagnosis not present

## 2023-11-24 DIAGNOSIS — M549 Dorsalgia, unspecified: Secondary | ICD-10-CM | POA: Diagnosis not present

## 2023-11-24 DIAGNOSIS — C8330 Diffuse large B-cell lymphoma, unspecified site: Secondary | ICD-10-CM | POA: Diagnosis not present

## 2023-11-24 DIAGNOSIS — Z800 Family history of malignant neoplasm of digestive organs: Secondary | ICD-10-CM | POA: Diagnosis not present

## 2023-11-24 DIAGNOSIS — Z860100 Personal history of colon polyps, unspecified: Secondary | ICD-10-CM | POA: Insufficient documentation

## 2023-11-24 DIAGNOSIS — Z79899 Other long term (current) drug therapy: Secondary | ICD-10-CM | POA: Insufficient documentation

## 2023-11-24 DIAGNOSIS — D701 Agranulocytosis secondary to cancer chemotherapy: Secondary | ICD-10-CM | POA: Diagnosis not present

## 2023-11-24 DIAGNOSIS — G4730 Sleep apnea, unspecified: Secondary | ICD-10-CM | POA: Diagnosis not present

## 2023-11-24 DIAGNOSIS — I2510 Atherosclerotic heart disease of native coronary artery without angina pectoris: Secondary | ICD-10-CM | POA: Insufficient documentation

## 2023-11-24 DIAGNOSIS — Z87891 Personal history of nicotine dependence: Secondary | ICD-10-CM | POA: Insufficient documentation

## 2023-11-24 LAB — CBC WITH DIFFERENTIAL (CANCER CENTER ONLY)
Abs Immature Granulocytes: 0.02 10*3/uL (ref 0.00–0.07)
Basophils Absolute: 0 10*3/uL (ref 0.0–0.1)
Basophils Relative: 0 %
Eosinophils Absolute: 0.1 10*3/uL (ref 0.0–0.5)
Eosinophils Relative: 2 %
HCT: 41.4 % (ref 36.0–46.0)
Hemoglobin: 13.8 g/dL (ref 12.0–15.0)
Immature Granulocytes: 0 %
Lymphocytes Relative: 31 %
Lymphs Abs: 1.9 10*3/uL (ref 0.7–4.0)
MCH: 31.4 pg (ref 26.0–34.0)
MCHC: 33.3 g/dL (ref 30.0–36.0)
MCV: 94.3 fL (ref 80.0–100.0)
Monocytes Absolute: 0.6 10*3/uL (ref 0.1–1.0)
Monocytes Relative: 10 %
Neutro Abs: 3.5 10*3/uL (ref 1.7–7.7)
Neutrophils Relative %: 57 %
Platelet Count: 162 10*3/uL (ref 150–400)
RBC: 4.39 MIL/uL (ref 3.87–5.11)
RDW: 12.6 % (ref 11.5–15.5)
WBC Count: 6.1 10*3/uL (ref 4.0–10.5)
nRBC: 0 % (ref 0.0–0.2)

## 2023-11-24 LAB — CMP (CANCER CENTER ONLY)
ALT: 30 U/L (ref 0–44)
AST: 24 U/L (ref 15–41)
Albumin: 4.3 g/dL (ref 3.5–5.0)
Alkaline Phosphatase: 89 U/L (ref 38–126)
Anion gap: 6 (ref 5–15)
BUN: 17 mg/dL (ref 6–20)
CO2: 32 mmol/L (ref 22–32)
Calcium: 9.7 mg/dL (ref 8.9–10.3)
Chloride: 101 mmol/L (ref 98–111)
Creatinine: 0.74 mg/dL (ref 0.44–1.00)
GFR, Estimated: >60 mL/min (ref >60)
Glucose, Bld: 112 mg/dL — ABNORMAL HIGH (ref 70–99)
Potassium: 4.4 mmol/L (ref 3.5–5.1)
Sodium: 139 mmol/L (ref 135–145)
Total Bilirubin: 0.5 mg/dL (ref 0.0–1.2)
Total Protein: 7.1 g/dL (ref 6.5–8.1)

## 2023-11-24 LAB — LACTATE DEHYDROGENASE: LDH: 200 U/L — ABNORMAL HIGH (ref 98–192)

## 2023-11-24 NOTE — Progress Notes (Signed)
HEMATOLOGY/ONCOLOGY CLINIC NOTE  Date of Service: 11/24/23  Patient Care Team: Doreene Nest, NP as PCP - General (Internal Medicine) Barnett Abu, MD as Consulting Physician (Neurosurgery) Emelia Loron, MD as Consulting Physician (General Surgery)  CHIEF COMPLAINTS/PURPOSE OF CONSULTATION:  Follow-up for continued evaluation and management of large B-cell lymphoma   HISTORY OF PRESENTING ILLNESS:  Please see previous note for details of initial presentation  INTERVAL HISTORY:  Virginia Oneal is a 58 y.o. female is here for follow-up of her diffuse large B-cell lymphoma.  Patient was last seen by me on 08/24/2023 and complained of hot flashes and stable lower back pain. She reported removal of 2 pre-cancerous polyps as shown on colonoscopy.   Today, she reports that she has been doing well overall since her last clinical visit. However, patient complains of relatively severe lower back pain in the area of her biopsy. Her pain is worsened when standing/walking. Patient was noted to have a tender spot in her center back, but not in her sacroiliac joint. She denies any injuries to the back. She reports that she is considering evaluation by orthopedics. She reports that she did see an orthopedics doctor previously.   Patient denies any abdominal pain, change in bowel habits, leg swelling, infection issues, fever, chills, night sweats, or difficulty consuming meals normally. She denies any recent major medication changes.   She complains of low energy levels. Her back pain limits her activity/ability to walk.   MEDICAL HISTORY:  Past Medical History:  Diagnosis Date   Abnormal uterine bleeding    Anemia    as a teenager   Anxiety    postpartum and hx panic attacks   Arrhythmia    Arthritis    back and hands   b cell lymphoma 08/2021   B cell   CAD (coronary artery disease) 07/22/2023   Chest mass    by CT scanning   Chest pain 07/26/2014   Complication of  anesthesia    slow to wake up   COVID-19 10/2020   Depression    DOE (dyspnea on exertion) 07/26/2014   Family history of adverse reaction to anesthesia    mom slow to wake up   Gestational diabetes    resolved after pregnancy   Gross hematuria 11/20/2020   History of kidney stones    Hypertension    Morbid (severe) obesity due to excess calories (HCC) 07/22/2023   bmi 44.29   Personal history of colonic polyps    PONV (postoperative nausea and vomiting)    PREMATURE ATRIAL CONTRACTIONS 08/15/2008   Qualifier: Diagnosis of  By: Hetty Ely MD, Franne Grip    RENAL CALCULUS, HX OF 08/15/2008   Qualifier: Diagnosis of  By: Lorrene Reid LPN, Wanda     Rotator cuff tendinitis 06/29/2011   Seasonal allergies    Sleep apnea    Urinary incontinence 05/2009   hx bladder sling--Dr. Edward Jolly    SURGICAL HISTORY: Past Surgical History:  Procedure Laterality Date   ABDOMINAL HYSTERECTOMY  05/2009   TAH/LSO   BLADDER SUSPENSION  2010   Dr. Edward Jolly   BREAST BIOPSY     left stereo   CESAREAN SECTION  03/2003   CESAREAN SECTION  2004   CHOLECYSTECTOMY N/A 01/02/2021   Procedure: LAPAROSCOPIC CHOLECYSTECTOMY;  Surgeon: Emelia Loron, MD;  Location: [facility];  Service: General;  Laterality: N/A;  58  - GEN AND TAP BLOCK LDOW   COLONOSCOPY  09/08   COLPORRHAPHY  05/2009   Dr.  Edward Jolly   ENDOMETRIAL ABLATION  2006   Dr. Edward Jolly   INCONTINENCE SURGERY     sling for urinary incontinence   IR IMAGING GUIDED PORT INSERTION  09/01/2021   IR REMOVAL TUN ACCESS W/ PORT W/O FL MOD SED  10/29/2022   KYPHOPLASTY N/A 04/07/2021   Procedure: Thoracic 12 Vertebral body biopsy;  Surgeon: Barnett Abu, MD;  Location: [facility] OR;  Service: Neurosurgery;  Laterality: N/A;   LAPAROSCOPIC ENDOMETRIOSIS FULGURATION     ablation   LUMBAR LAMINECTOMY/DECOMPRESSION MICRODISCECTOMY N/A 07/31/2021   Procedure: Open Transpedicular biopsy of Thoracic twelve;  Surgeon: Barnett Abu, MD;  Location: [facility] OR;  Service: Neurosurgery;   Laterality: N/A;   OOPHORECTOMY  2010   w/hyst--Dr. Edward Jolly   TUBAL LIGATION  2004   w/ C-section Dr. Edward Jolly   TYMPANOPLASTY     right ear   VAGINAL HYSTERECTOMY  05/21/2009   lap assisted /partial    SOCIAL HISTORY: Social History   Socioeconomic History   Marital status: Married    Spouse name: Not on file   Number of children: 2   Years of education: Not on file   Highest education level: Not on file  Occupational History   Occupation: [employer]  Tobacco Use   Smoking status: Former    Current packs/day: 0.00    Types: Cigarettes    Start date: 01/05/1989    Quit date: 01/06/1999    Years since quitting: 24.8   Smokeless tobacco: Never  Vaping Use   Vaping status: Never Used  Substance and Sexual Activity   Alcohol use: No    Alcohol/week: 0.0 standard drinks of alcohol   Drug use: No   Sexual activity: Yes    Partners: Male    Birth control/protection: Surgical    Comment: TAH/LSO  Other Topics Concern   Not on file  Social History Narrative   ** Merged History Encounter **       Social Drivers of Corporate investment banker Strain: Not on file  Food Insecurity: Not on file  Transportation Needs: Not on file  Physical Activity: Not on file  Stress: Not on file  Social Connections: Unknown (02/23/2022)   Received from [facility]   Social Network    Social Network: Not on file  Intimate Partner Violence: Unknown (01/15/2022)   Received from [facility], [facility]   HITS    Physically Hurt: Not on file    Insult or Talk Down To: Not on file    Threaten Physical Harm: Not on file    Scream or Curse: Not on file    FAMILY HISTORY: Family History  Problem Relation Age of Onset   Kidney disease Mother        renal failure   Stroke Mother    Diabetes Mother    Hypertension Mother    Heart failure Mother    Colon cancer Mother    Stroke Father    Hypertension Sister    Rheum arthritis Sister    Hypertension Sister     Hypertension Sister    Hypertension Sister    Breast cancer Sister    Kidney cancer Sister    Hypertension Brother    Cancer - Colon Maternal Aunt    Esophageal cancer Neg Hx    Rectal cancer Neg Hx    Stomach cancer Neg Hx    Colon polyps Neg Hx     ALLERGIES:  is allergic to buspar [buspirone hcl].  MEDICATIONS:  Current Outpatient Medications  Medication Sig Dispense Refill   acetaminophen (TYLENOL) 500 MG tablet Take 1,000 mg by mouth every 6 (six) hours as needed for moderate pain or headache.     aspirin EC 81 MG tablet Take 1 tablet (81 mg total) by mouth daily. Swallow whole. 90 tablet 3   b complex vitamins capsule Take 1 capsule by mouth daily.     betamethasone valerate (VALISONE) 0.1 % cream Apply topically as needed.     cetirizine (ZYRTEC) 10 MG tablet Take 10 mg by mouth daily.      Cholecalciferol (VITAMIN D3) 125 MCG (5000 UT) CAPS Take 5,000 Units by mouth daily.     hydrOXYzine (ATARAX) 10 MG tablet Take 1 or 2 tablets by mouth as needed for anxiety attack. (Patient not taking: Reported on 08/13/2023) 15 tablet 0   losartan-hydrochlorothiazide (HYZAAR) 50-12.5 MG tablet TAKE 1 TABLET BY MOUTH DAILY FOR BLOOD PRESSURE 90 tablet 3   Methylcobalamin (B-12) 5000 MCG TBDP Take 5,000 mcg by mouth daily.     metoprolol succinate (TOPROL XL) 25 MG 24 hr tablet Take 1 tablet (25 mg total) by mouth daily. 90 tablet 3   rosuvastatin (CRESTOR) 5 MG tablet Take 1 tablet (5 mg total) by mouth every evening. 90 tablet 3   venlafaxine XR (EFFEXOR-XR) 75 MG 24 hr capsule TAKE 1 CAPSULE (75 MG TOTAL) BY MOUTH DAILY WITH BREAKFAST. FOR HOT FLASHES AND ANXIETY 90 capsule 2   No current facility-administered medications for this visit.    REVIEW OF SYSTEMS:    10 Point review of Systems was done is negative except as noted above.   PHYSICAL EXAMINATION: .There were no vitals taken for this visit.   GENERAL:alert, in no acute distress and comfortable SKIN: no acute rashes, no  significant lesions EYES: conjunctiva are pink and non-injected, sclera anicteric OROPHARYNX: MMM, no exudates, no oropharyngeal erythema or ulceration NECK: supple, no JVD LYMPH:  no palpable lymphadenopathy in the cervical, axillary or inguinal regions LUNGS: clear to auscultation b/l with normal respiratory effort HEART: regular rate & rhythm ABDOMEN:  normoactive bowel sounds , non tender, not distended. Extremity: no pedal edema PSYCH: alert & oriented x 3 with fluent speech NEURO: no focal motor/sensory deficits   LABORATORY DATA:  I have reviewed the data as listed  .    Latest Ref Rng & Units 08/24/2023    9:34 AM 04/22/2023    9:41 AM 01/18/2023   10:03 AM  CBC  WBC 4.0 - 10.5 K/uL 6.2  6.1  6.3   Hemoglobin 12.0 - 15.0 g/dL 78.2  95.6  21.3   Hematocrit 36.0 - 46.0 % 39.8  39.5  39.9   Platelets 150 - 400 K/uL 143  144  150    .    Latest Ref Rng & Units 08/24/2023    9:34 AM 04/22/2023    9:41 AM 01/18/2023   10:03 AM  CMP  Glucose 70 - 99 mg/dL 086  578  469   BUN 6 - 20 mg/dL 15  15  18    Creatinine 0.44 - 1.00 mg/dL 6.29  5.28  4.13   Sodium 135 - 145 mmol/L 141  141  141   Potassium 3.5 - 5.1 mmol/L 4.1  4.7  4.0   Chloride 98 - 111 mmol/L 105  105  103   CO2 22 - 32 mmol/L 31  31  32   Calcium 8.9 - 10.3 mg/dL 9.4  9.8  24.4  Total Protein 6.5 - 8.1 g/dL 6.4  6.6  6.8   Total Bilirubin <1.2 mg/dL 0.4  0.4  0.6   Alkaline Phos 38 - 126 U/L 98  96  89   AST 15 - 41 U/L 19  20  21    ALT 0 - 44 U/L 26  25  27     Lab Results  Component Value Date   LDH 186 08/24/2023    HIV Screen 4th Generation wRfx     Non Reactive Non Reactive  Hepatitis B Surface Ag     NON REACTIVE NON REACTIVE  Hep B Core Total Ab     NON REACTIVE NON REACTIVE  HCV Ab     NON REACTIVE NON REACTIVE  LDH     98 - 192 U/L 228 (H)    SURGICAL PATHOLOGY  CASE: [id number]  PATIENT: Virginia Oneal  Surgical Pathology Report   Clinical History: lesion of thoracic vertebra (cm)    FINAL MICROSCOPIC DIAGNOSIS:   A-C. BONE, T12 VERTEBRAL BODY AND MARROW, BIOPSY:  -  Non-Hodgkin B-cell lymphoma, high grade  -  See comment   COMMENT:   A-C.  The biopsies show normal hematopoeisis admixed with crush artifact  with associated marrow fibrosis and an atypical cellular infiltrate  composed of medium to large cells with irregular nuclear contours, ample  cytoplasm, variably prominent nucleoli, increased mitoses and apoptosis.  An initial panel of immunohistochemistry (cytokeratin AE1/3, cytokeratin  7, cytokeratin 20, CD20 and CD3) confirmed the morphologic impression  that the atypical cells were lymphoid in nature.  The atypical  lymphocytes are positive for Pax-5, CD20, CD10, bcl-6 but negative for  CD30, bcl-2, mum-1 and EBV by in-situ hybridization. CD3 and CD5  highlight background T-cells.  The proliferative rate by ki-67 is  increased (50-60%).  Overall, the findings are consistent with a  non-Hodgkin B-cell lymphoma, high grade with a germinal center  phenotype.   FISH studies are pending and will be reported in an  addendum.  These findings were discussed with Dr. Danielle Dess on August 06, 2021.    RADIOGRAPHIC STUDIES: I have personally reviewed the radiological images as listed and agreed with the findings in the report.  ECHO 09/08/2021 1.Left ventricular ejection fraction, by estimation, is 55 to 60%. The left ventricle has normal function. The left ventricle has no regional wall motion abnormalities. There ismild left ventricular hypertrophy. Left ventricular diastolic parameters were normal.The average left ventricular global longitudinal strain is -20.4 %. The global longitudinal strain is normal. 2. Right ventricular systolic function is normal. The right ventricular size is normal. 3. Left atrial size was mildly dilated. The mitral valve is normal in structure. Trivial mitral valve regurgitation. No evidence of mitral stenosis. Moderate mitral  annular calcification. 4.The aortic valve is normal in structure. Aortic valve regurgitation is not visualized. No aortic stenosis is present. 5.The inferior vena cava is normal in size with greater than 50% respiratory variability, suggesting right atrial pressure of 3 mmHg.  PET/CT scan 09/09/2022  IMPRESSION: 1. No specific findings of active malignancy. Previous low-grade activity in the T12 vertebral body has resolved. 2. Focal accentuated activity in the distal esophagus with maximum SUV 4.4, likely physiologic, no CT correlate or significant accentuated activity on the prior exam in this region. 3. Hepatic steatosis. 4. Nonobstructive bilateral nephrolithiasis. 5. Aortic and coronary atherosclerosis.  Mitral valve calcification.   ASSESSMENT & PLAN:   58 year old female with  1) Stage IVA high-grade large B cell non-Hodgkin's lymphoma.  Germinal center phenotype.  C-Myc not rearranged.  Ki-67 50 to 60% No overt constitutional symptoms PET/CT with involvement of thoracic spine and acromion process on the right.  Abdominal lymphadenopathy.  Hepatitis testing unremarkable HIV nonreactive Myeloma panel shows no M spike protein spike.  Echocardiogram shows normal ejection fraction  PET CT scan done on 09/02/2021 shows Scattered mild hypermetabolism within subcentimeter periportal, retroperitoneal and mesenteric lymph nodes, compatible with the given history of large B-cell lymphoma. 2. Hypermetabolism within known T7 and T12 lesions, better seen on MR thoracic spine 07/04/2021. 3. Focal hypermetabolism in the acromion of the right scapula, without a definite CT correlate. Recommend attention on follow-up.  2) Chemotherapy related neutropenia -now resolved.  No issues with infections.    3)h/o  G-CSF related significant bone pain.  PLAN:  -Discussed lab results on 11/24/23 in detail with patient. CBC normal, showed WBC of 6.1K, hemoglobin of 13.8, and platelets of  162K. -platelets, which were previously borderline low/normal at 143K three months ago have normalized at this time to 162K -CMP stable; no elevated liver function -LDH is in high normal range, which is not concerning at this time -Her CT scan at 2-year mark of treatment completion is fairly clean with no new findings and no sign of active persistent lymphoma.  -discussed incidental finding of diverticulosis, which has the potential to sometimes become infected and cause pain, though it generally does not cause issues -also discussed finding of fatty liver, which shall be addressed with lifestyle modifications with PCP -No indication of recurrence/progression of lymphoma.  -discussed that it is possible that her back pain could be due to degenerative changes.  -discussed option of referral to orthopedics to evaluate for pinched nerves or disk issues. Discussed that if there is no need for surgical intervention, physical therapy may be considered for muscle strengthening.   FOLLOW-UP: RTC with Dr Candise Che with labs in 6 months  The total time spent in the appointment was *** minutes* .  All of the patient's questions were answered with apparent satisfaction. The patient knows to call the clinic with any problems, questions or concerns.   Wyvonnia Lora MD MS AAHIVMS [facility] Hematology/Oncology Physician [facility]  .*Total Encounter Time as defined by the Centers for Medicare and Medicaid Services includes, in addition to the face-to-face time of a patient visit (documented in the note above) non-face-to-face time: obtaining and reviewing outside history, ordering and reviewing medications, tests or procedures, care coordination (communications with other health care professionals or caregivers) and documentation in the medical record.    I,Mitra Faeizi,acting as a Neurosurgeon for Wyvonnia Lora, MD.,have documented all relevant documentation on the behalf of Wyvonnia Lora, MD,as directed by   Wyvonnia Lora, MD while in the presence of Wyvonnia Lora, MD.  ***

## 2023-11-25 ENCOUNTER — Telehealth: Payer: Self-pay | Admitting: Hematology

## 2023-11-25 NOTE — Telephone Encounter (Signed)
Spoke with patient confirming upcoming appointment

## 2023-12-01 ENCOUNTER — Encounter: Payer: Self-pay | Admitting: Hematology

## 2023-12-10 ENCOUNTER — Ambulatory Visit: Payer: 59 | Admitting: Sleep Medicine

## 2023-12-10 ENCOUNTER — Encounter: Payer: Self-pay | Admitting: Sleep Medicine

## 2023-12-10 VITALS — BP 128/86 | HR 77 | Temp 97.6°F | Ht 64.0 in | Wt 267.6 lb

## 2023-12-10 DIAGNOSIS — I10 Essential (primary) hypertension: Secondary | ICD-10-CM

## 2023-12-10 DIAGNOSIS — Z6841 Body Mass Index (BMI) 40.0 and over, adult: Secondary | ICD-10-CM | POA: Diagnosis not present

## 2023-12-10 DIAGNOSIS — G4733 Obstructive sleep apnea (adult) (pediatric): Secondary | ICD-10-CM | POA: Diagnosis not present

## 2023-12-10 DIAGNOSIS — Z87891 Personal history of nicotine dependence: Secondary | ICD-10-CM

## 2023-12-10 NOTE — Patient Instructions (Signed)
 Virginia Oneal

## 2023-12-10 NOTE — Progress Notes (Signed)
 Name:Virginia Oneal MRN: [id number] DOB: [birth year]   CHIEF COMPLAINT:  EXCESSIVE DAYTIME SLEEPINESS   HISTORY OF PRESENT ILLNESS:  Virginia Oneal is a 58 y.o. w/ a h/o OSA, HTN, B cell lymphoma s/p chemo and XRT, morbid obesity, hyperlipidemia and anxiety who presents for c/o loud snoring and excessive daytime sleepiness which has been present for several months. Patient was diagnosed with mild OSA on 06/04/23 which revealed mild OSA (AHI 10, O2 nadir 82%). States that she did not proceed with CPAP therapy.   Reports nocturnal awakenings due to nocturia, however does not have difficulty falling back to sleep. Reports a 20 lb weight gain over the last year. Admits to morning headaches, night sweats and dry mouth. Denies RLS symptoms, dream enactment, cataplexy, hypnagogic or hypnapompic hallucinations. Denies a family history of sleep apnea. Denies drowsy driving. Drinks 2 glasses of tea or 1-2 sodas daily, denies alcohol, tobacco or illicit drug use.   Bedtime 10-11 pm Sleep onset 10 mins Rise time 6:30 am   EPWORTH SLEEP SCORE 12    12/10/2023    8:49 AM  Results of the Epworth flowsheet  Sitting and reading 2  Watching TV 2  Sitting, inactive in a public place (e.g. a theatre or a meeting) 1  As a passenger in a car for an hour without a break 3  Lying down to rest in the afternoon when circumstances permit 2  Sitting and talking to someone 0  Sitting quietly after a lunch without alcohol 2  In a car, while stopped for a few minutes in traffic 0  Total score 12    PAST MEDICAL HISTORY :   has a past medical history of Abnormal uterine bleeding, Anemia, Anxiety, Arrhythmia, Arthritis, b cell lymphoma (08/2021), CAD (coronary artery disease) (07/22/2023), Chest mass, Chest pain (07/26/2014), Complication of anesthesia, COVID-19 (10/2020), Depression, DOE (dyspnea on exertion) (07/26/2014), Family history of adverse reaction to anesthesia, Gestational diabetes, Gross  hematuria (11/20/2020), History of kidney stones, Hypertension, Morbid (severe) obesity due to excess calories (HCC) (07/22/2023), Personal history of colonic polyps, PONV (postoperative nausea and vomiting), PREMATURE ATRIAL CONTRACTIONS (08/15/2008), RENAL CALCULUS, HX OF (08/15/2008), Rotator cuff tendinitis (06/29/2011), Seasonal allergies, Sleep apnea, and Urinary incontinence (05/2009).  has a past surgical history that includes Cesarean section (03/2003); Vaginal hysterectomy (05/21/2009); Laparoscopic endometriosis fulguration; Incontinence surgery; Colonoscopy (09/08); Tympanoplasty; Colporrhaphy (05/2009); Abdominal hysterectomy (05/2009); Cesarean section (2004); Bladder suspension (2010); Endometrial ablation (2006); Oophorectomy (2010); Tubal ligation (2004); Breast biopsy; Cholecystectomy (N/A, 01/02/2021); Kyphoplasty (N/A, 04/07/2021); Lumbar laminectomy/decompression microdiscectomy (N/A, 07/31/2021); IR IMAGING GUIDED PORT INSERTION (09/01/2021); and IR REMOVAL TUN ACCESS W/ PORT W/O FL MOD SED (10/29/2022). Prior to Admission medications   Medication Sig Start Date End Date Taking? Authorizing Provider  acetaminophen (TYLENOL) 500 MG tablet Take 1,000 mg by mouth every 6 (six) hours as needed for moderate pain or headache.   Yes [provider]  aspirin EC 81 MG tablet Take 1 tablet (81 mg total) by mouth daily. Swallow whole. 04/06/23  Yes Little Ishikawa, MD  b complex vitamins capsule Take 1 capsule by mouth daily. 09/25/21  Yes Johney Maine, MD  betamethasone valerate (VALISONE) 0.1 % cream Apply topically as needed.   Yes [provider]  cetirizine (ZYRTEC) 10 MG tablet Take 10 mg by mouth daily.  01/10/18  Yes [provider]  Cholecalciferol (VITAMIN D3) 125 MCG (5000 UT) CAPS Take 5,000 Units by mouth daily.   Yes [provider]  hydrOXYzine (ATARAX) 10 MG tablet Take 1 or 2 tablets by mouth as needed for anxiety attack. 06/08/22  Yes  Doreene Nest, NP  losartan-hydrochlorothiazide (HYZAAR) 50-12.5 MG tablet TAKE 1 TABLET BY MOUTH DAILY FOR BLOOD PRESSURE 06/04/23  Yes Doreene Nest, NP  Methylcobalamin (B-12) 5000 MCG TBDP Take 5,000 mcg by mouth daily.   Yes [provider]  metoprolol succinate (TOPROL XL) 25 MG 24 hr tablet Take 1 tablet (25 mg total) by mouth daily. 04/06/23  Yes Little Ishikawa, MD  rosuvastatin (CRESTOR) 5 MG tablet Take 1 tablet (5 mg total) by mouth every evening. 04/06/23  Yes Little Ishikawa, MD  venlafaxine XR (EFFEXOR-XR) 75 MG 24 hr capsule TAKE 1 CAPSULE (75 MG TOTAL) BY MOUTH DAILY WITH BREAKFAST. FOR HOT FLASHES AND ANXIETY 08/24/23  Yes Doreene Nest, NP   Allergies  Allergen Reactions   Buspar [Buspirone Hcl]     Worsened her panic attacks    FAMILY HISTORY:  family history includes Breast cancer in her sister; Cancer - Colon in her maternal aunt; Colon cancer in her mother; Diabetes in her mother; Heart failure in her mother; Hypertension in her brother, mother, sister, sister, sister, and sister; Kidney cancer in her sister; Kidney disease in her mother; Rheum arthritis in her sister; Stroke in her father and mother. SOCIAL HISTORY:  reports that she quit smoking about 24 years ago. Her smoking use included cigarettes. She started smoking about 34 years ago. She has never used smokeless tobacco. She reports that she does not drink alcohol and does not use drugs.   Review of Systems:  Gen:  Denies  fever, sweats, chills weight loss  HEENT: Denies blurred vision, double vision, ear pain, eye pain, hearing loss, nose bleeds, sore throat Cardiac:  No dizziness, chest pain or heaviness, chest tightness,edema, No JVD Resp:  + shortness of breath on exertion,-wheezing, -hemoptysis,  Gi: Denies swallowing difficulty, stomach pain, nausea or vomiting, diarrhea, constipation, bowel incontinence Gu:  Denies bladder incontinence, burning urine Ext:   Denies  Joint pain, stiffness or swelling Skin: Denies  skin rash, easy bruising or bleeding or hives Endoc:  Denies polyuria, polydipsia , polyphagia or weight change Psych:   Denies depression, insomnia or hallucinations  Other:  All other systems negative  VITAL SIGNS: BP 128/86 (BP Location: Left Arm, Patient Position: Sitting, Cuff Size: Normal)   Pulse 77   Temp 97.6 F (36.4 C) (Temporal)   Ht 5\' 4"  (1.626 m)   Wt 267 lb 9.6 oz (121.4 kg)   SpO2 95%   BMI 45.93 kg/m    Physical Examination:   General Appearance: No distress  EYES PERRLA, EOM intact.   NECK Supple, No JVD Pulmonary: normal breath sounds, No wheezing.  CardiovascularNormal S1,S2.  No m/r/g.   Abdomen: Benign, Soft, non-tender. Skin:   warm, no rashes, no ecchymosis  Extremities: normal, no cyanosis, clubbing. Neuro:without focal findings,  speech normal  PSYCHIATRIC: Mood, affect within normal limits.   ASSESSMENT AND PLAN  OSA Reviewed HST results with patient. Starting on APAP therapy set to 4-16 cm H2O. Discussed the consequences of untreated sleep apnea. Advised not to drive drowsy for safety of patient and others. Will follow up in 3 months to review CPAP efficacy and compliance data.   HTN Stable, on current management. Following with PCP.   Patient satisfied with Plan of action and management. All questions answered   I spent a total of 45 minutes reviewing chart data,  face-to-face evaluation with the patient, counseling and coordination of care as detailed above.    Tempie Hoist, M.D.  Sleep Medicine [facility]

## 2023-12-28 ENCOUNTER — Telehealth: Payer: Self-pay | Admitting: Sleep Medicine

## 2023-12-28 NOTE — Telephone Encounter (Signed)
 PT also has Pulmonary issues and Dr. Betti Cruz said she'd refer her to a colleague there I the office. Has this been done? She has not heard back. TY.

## 2023-12-29 NOTE — Telephone Encounter (Signed)
 Patient reports she is having shortness of breath on exertion. Please advise.

## 2023-12-29 NOTE — Telephone Encounter (Signed)
 Lmtcb. To schedule with Dr. Larinda Buttery.

## 2023-12-31 NOTE — Telephone Encounter (Signed)
Appt scheduled.  Nothing further needed.   

## 2024-01-04 ENCOUNTER — Ambulatory Visit: Admitting: Pulmonary Disease

## 2024-01-04 ENCOUNTER — Encounter: Payer: Self-pay | Admitting: Pulmonary Disease

## 2024-01-04 VITALS — BP 126/78 | HR 86 | Resp 14 | Ht 64.0 in | Wt 269.4 lb

## 2024-01-04 DIAGNOSIS — R0602 Shortness of breath: Secondary | ICD-10-CM

## 2024-01-04 LAB — POCT EXHALED NITRIC OXIDE: FeNO level (ppb): 17

## 2024-01-04 MED ORDER — BUDESONIDE-FORMOTEROL FUMARATE 160-4.5 MCG/ACT IN AERO: 2.0000 | INHALATION_SPRAY | Freq: Two times a day (BID) | RESPIRATORY_TRACT | 12 refills | Status: DC

## 2024-01-04 MED ORDER — ALBUTEROL SULFATE HFA 108 (90 BASE) MCG/ACT IN AERS: 2.0000 | INHALATION_SPRAY | Freq: Four times a day (QID) | RESPIRATORY_TRACT | 2 refills | Status: DC | PRN

## 2024-01-04 NOTE — Progress Notes (Unsigned)
anm

## 2024-03-17 ENCOUNTER — Telehealth: Payer: Self-pay | Admitting: Cardiology

## 2024-03-17 MED ORDER — ASPIRIN 81 MG PO TBEC: 81.0000 mg | DELAYED_RELEASE_TABLET | Freq: Every day | ORAL | 1 refills | Status: DC

## 2024-03-17 MED ORDER — METOPROLOL SUCCINATE ER 25 MG PO TB24: 25.0000 mg | ORAL_TABLET | Freq: Every day | ORAL | 1 refills | Status: DC

## 2024-03-17 MED ORDER — ROSUVASTATIN CALCIUM 5 MG PO TABS: 5.0000 mg | ORAL_TABLET | Freq: Every evening | ORAL | 1 refills | Status: DC

## 2024-03-17 NOTE — Addendum Note (Signed)
 Addended by: Debrah Fan, [place] L on: 03/17/2024 03:00 PM   Modules accepted: Orders

## 2024-03-17 NOTE — Telephone Encounter (Signed)
 Patient says she forgot to request a refill for  Metoprolol  25 MG once daily as well.

## 2024-03-17 NOTE — Telephone Encounter (Signed)
*  STAT* If patient is at the pharmacy, call can be transferred to refill team.   1. Which medications need to be refilled? (please list name of each medication and dose if known) rosuvastatin  (CRESTOR ) 5 MG tablet    aspirin  EC 81 MG tablet   4. Which pharmacy/location (including street and city if local pharmacy) is medication to be sent to?  [id number] - [place], [place] - [address]     5. Do they need a 30 day or 90 day supply? 90

## 2024-03-17 NOTE — Telephone Encounter (Signed)
 RX sent to requested Pharmacy

## 2024-03-17 NOTE — Addendum Note (Signed)
 Addended by: Debrah Fan, Jolynda Townley L on: 03/17/2024 02:48 PM   Modules accepted: Orders

## 2024-03-18 ENCOUNTER — Other Ambulatory Visit: Payer: Self-pay | Admitting: Pulmonary Disease

## 2024-03-18 DIAGNOSIS — R0602 Shortness of breath: Secondary | ICD-10-CM

## 2024-03-20 MED ORDER — ASPIRIN 81 MG PO TBEC: DELAYED_RELEASE_TABLET | ORAL | 1 refills | Status: AC

## 2024-03-20 MED ORDER — METOPROLOL SUCCINATE ER 25 MG PO TB24: 25.0000 mg | ORAL_TABLET | Freq: Every day | ORAL | 1 refills | Status: AC

## 2024-03-20 MED ORDER — ROSUVASTATIN CALCIUM 5 MG PO TABS: 5.0000 mg | ORAL_TABLET | Freq: Every evening | ORAL | 1 refills | Status: AC

## 2024-03-20 NOTE — Addendum Note (Signed)
 Addended by: Debrah Fan, Walaa Carel L on: 03/20/2024 08:02 AM   Modules accepted: Orders

## 2024-03-31 ENCOUNTER — Ambulatory Visit: Admitting: Sleep Medicine

## 2024-03-31 ENCOUNTER — Telehealth: Payer: Self-pay

## 2024-03-31 ENCOUNTER — Encounter: Payer: Self-pay | Admitting: Sleep Medicine

## 2024-03-31 VITALS — BP 124/76 | HR 79 | Temp 97.1°F | Ht 64.0 in | Wt 276.0 lb

## 2024-03-31 DIAGNOSIS — I10 Essential (primary) hypertension: Secondary | ICD-10-CM

## 2024-03-31 DIAGNOSIS — G4733 Obstructive sleep apnea (adult) (pediatric): Secondary | ICD-10-CM

## 2024-03-31 DIAGNOSIS — Z6841 Body Mass Index (BMI) 40.0 and over, adult: Secondary | ICD-10-CM | POA: Diagnosis not present

## 2024-03-31 DIAGNOSIS — Z87891 Personal history of nicotine dependence: Secondary | ICD-10-CM

## 2024-03-31 MED ORDER — ZEPBOUND 2.5 MG/0.5ML ~~LOC~~ SOAJ: 2.5000 mg | SUBCUTANEOUS | 1 refills | Status: DC

## 2024-03-31 NOTE — Telephone Encounter (Signed)
*  Pulm  Pharmacy Patient Advocate Encounter   Received notification from CoverMyMeds that prior authorization for Zepbound 2.5MG /0.5ML pen-injectors  is required/requested.   Insurance verification completed.   The patient is insured through [employer] .   Per test claim: PA required; PA submitted to above mentioned insurance via CoverMyMeds Key/confirmation #/EOC [id number] Status is pending

## 2024-03-31 NOTE — Progress Notes (Signed)
 Name:Virginia Oneal MRN: [id number] DOB: October 05, 1966   CHIEF COMPLAINT:  CPAP F/U   HISTORY OF PRESENT ILLNESS:  Virginia Oneal is a 58 y.o. w/ a h/o OSA, HTN and morbid obesity who presents for CPAP F/U visit. Reports using CPAP therapy every night, which is confirmed by compliance data. Overall reports feeling more refreshed upon awakening with CPAP therapy. She is currently using a full face mask, which is comfortable. Reports occasional air leaks.     EPWORTH SLEEP SCORE    12/10/2023    8:49 AM  Results of the Epworth flowsheet  Sitting and reading 2  Watching TV 2  Sitting, inactive in a public place (e.g. a theatre or a meeting) 1  As a passenger in a car for an hour without a break 3  Lying down to rest in the afternoon when circumstances permit 2  Sitting and talking to someone 0  Sitting quietly after a lunch without alcohol 2  In a car, while stopped for a few minutes in traffic 0  Total score 12      PAST MEDICAL HISTORY :   has a past medical history of Abnormal uterine bleeding, Anemia, Anxiety, Arrhythmia, Arthritis, b cell lymphoma (08/2021), CAD (coronary artery disease) (07/22/2023), Chest mass, Chest pain (07/26/2014), Complication of anesthesia, COVID-19 (10/2020), Depression, DOE (dyspnea on exertion) (07/26/2014), Family history of adverse reaction to anesthesia, Gestational diabetes, Gross hematuria (11/20/2020), History of kidney stones, Hypertension, Morbid (severe) obesity due to excess calories (HCC) (07/22/2023), Personal history of colonic polyps, PONV (postoperative nausea and vomiting), PREMATURE ATRIAL CONTRACTIONS (08/15/2008), RENAL CALCULUS, HX OF (08/15/2008), Rotator cuff tendinitis (06/29/2011), Seasonal allergies, Sleep apnea, and Urinary incontinence (05/2009).  has a past surgical history that includes Cesarean section (03/2003); Vaginal hysterectomy (05/21/2009); Laparoscopic endometriosis fulguration; Incontinence surgery; Colonoscopy  (09/08); Tympanoplasty; Colporrhaphy (05/2009); Abdominal hysterectomy (05/2009); Cesarean section (2004); Bladder suspension (2010); Endometrial ablation (2006); Oophorectomy (2010); Tubal ligation (2004); Breast biopsy; Cholecystectomy (N/A, 01/02/2021); Kyphoplasty (N/A, 04/07/2021); Lumbar laminectomy/decompression microdiscectomy (N/A, 07/31/2021); IR IMAGING GUIDED PORT INSERTION (09/01/2021); and IR REMOVAL TUN ACCESS W/ PORT W/O FL MOD SED (10/29/2022). Prior to Admission medications   Medication Sig Start Date End Date Taking? Authorizing Provider  acetaminophen  (TYLENOL ) 500 MG tablet Take 1,000 mg by mouth every 6 (six) hours as needed for moderate pain or headache.   Yes [provider]  albuterol  (VENTOLIN  HFA) 108 (90 Base) MCG/ACT inhaler TAKE 2 PUFFS BY MOUTH EVERY 6 HOURS AS NEEDED FOR WHEEZE OR SHORTNESS OF BREATH 03/19/24  Yes Assaker, Marianne Shirts, MD  aspirin  EC 81 MG tablet Take 1 Tablet by Mouth Daily Swallow whole. 03/20/24  Yes Wendie Hamburg, MD  b complex vitamins capsule Take 1 capsule by mouth daily. 09/25/21  Yes Frankie Israel, MD  betamethasone  valerate (VALISONE ) 0.1 % cream Apply topically as needed.   Yes [provider]  budesonide -formoterol  (SYMBICORT ) 160-4.5 MCG/ACT inhaler Inhale 2 puffs into the lungs in the morning and at bedtime. 01/04/24  Yes Assaker, Marianne Shirts, MD  cetirizine (ZYRTEC) 10 MG tablet Take 10 mg by mouth daily.  01/10/18  Yes [provider]  Cholecalciferol (VITAMIN D3) 125 MCG (5000 UT) CAPS Take 5,000 Units by mouth daily.   Yes [provider]  hydrOXYzine  (ATARAX ) 10 MG tablet Take 1 or 2 tablets by mouth as needed for anxiety attack. 06/08/22  Yes Clark, Katherine K, NP  losartan -hydrochlorothiazide  (HYZAAR) 50-12.5 MG tablet TAKE 1 TABLET BY MOUTH DAILY FOR BLOOD PRESSURE  06/04/23  Yes Clark, Katherine K, NP  Methylcobalamin (B-12) 5000 MCG TBDP Take 5,000 mcg by mouth daily.   Yes [provider]  metoprolol  succinate (TOPROL  XL) 25 MG 24 hr tablet Take 1 tablet (25 mg total) by mouth daily. 03/20/24  Yes Wendie Hamburg, MD  rosuvastatin  (CRESTOR ) 5 MG tablet Take 1 tablet (5 mg total) by mouth every evening. 03/20/24  Yes Wendie Hamburg, MD  venlafaxine  XR (EFFEXOR -XR) 75 MG 24 hr capsule TAKE 1 CAPSULE (75 MG TOTAL) BY MOUTH DAILY WITH BREAKFAST. FOR HOT FLASHES AND ANXIETY 08/24/23  Yes Clark, Katherine K, NP   Allergies  Allergen Reactions   Buspar  [Buspirone  Hcl]     Worsened her panic attacks    FAMILY HISTORY:  family history includes Breast cancer in her sister; Cancer - Colon in her maternal aunt; Colon cancer in her mother; Diabetes in her mother; Heart failure in her mother; Hypertension in her brother, mother, sister, sister, sister, and sister; Kidney cancer in her sister; Kidney disease in her mother; Rheum arthritis in her sister; Stroke in her father and mother. SOCIAL HISTORY:  reports that she quit smoking about 25 years ago. Her smoking use included cigarettes. She started smoking about 35 years ago. She has never used smokeless tobacco. She reports that she does not drink alcohol and does not use drugs.   Review of Systems:  Gen:  Denies  fever, sweats, chills weight loss  HEENT: Denies blurred vision, double vision, ear pain, eye pain, hearing loss, nose bleeds, sore throat Cardiac:  No dizziness, chest pain or heaviness, chest tightness,edema, No JVD Resp:   No cough, -sputum production, -shortness of breath,-wheezing, -hemoptysis,  Gi: Denies swallowing difficulty, stomach pain, nausea or vomiting, diarrhea, constipation, bowel incontinence Gu:  Denies bladder incontinence, burning urine Ext:   Denies Joint pain, stiffness or swelling Skin: Denies  skin rash, easy bruising or bleeding or hives Endoc:  Denies polyuria, polydipsia , polyphagia or weight change Psych:   Denies depression, insomnia or hallucinations  Other:  All  other systems negative  VITAL SIGNS: BP 124/76 (BP Location: Right Arm, Cuff Size: Large)   Pulse 79   Temp (!) 97.1 F (36.2 C)   Ht 5' 4 (1.626 m)   Wt 276 lb (125.2 kg)   SpO2 94%   BMI 47.38 kg/m    Physical Examination:   General Appearance: No distress  EYES PERRLA, EOM intact.   NECK Supple, No JVD Pulmonary: normal breath sounds, No wheezing.  CardiovascularNormal S1,S2.  No m/r/g.   Abdomen: Benign, Soft, non-tender. Skin:   warm, no rashes, no ecchymosis  Extremities: normal, no cyanosis, clubbing. Neuro:without focal findings,  speech normal  PSYCHIATRIC: Mood, affect within normal limits.   ASSESSMENT AND PLAN  OSA I suspect that OSA is likely present due to clinical presentation. Discussed the consequences of untreated sleep apnea. Advised not to drive drowsy for safety of patient and others. Will complete further evaluation with a home sleep study and follow up to review results.    HTN Stable, on current management. Following with PCP.   Morbid obesity Counseled patient on diet and lifestyle modification. Will also try patient on Zepbound and titrate as tolerated. Patient denies any family or personal history of thyroid  medullary cancer or pancreatitis.    Patient  satisfied with Plan of action and management. All questions answered  I spent a total of 34 minutes reviewing chart data, face-to-face evaluation with the patient, counseling and  coordination of care as detailed above.    Zarin Hagmann, M.D.  Sleep Medicine [facility]

## 2024-03-31 NOTE — Patient Instructions (Signed)
 Virginia Oneal

## 2024-04-07 ENCOUNTER — Telehealth: Payer: Self-pay

## 2024-04-07 ENCOUNTER — Other Ambulatory Visit (HOSPITAL_COMMUNITY): Payer: Self-pay

## 2024-04-07 DIAGNOSIS — R0602 Shortness of breath: Secondary | ICD-10-CM

## 2024-04-07 MED ORDER — BUDESONIDE-FORMOTEROL FUMARATE 160-4.5 MCG/ACT IN AERO: 2.0000 | INHALATION_SPRAY | Freq: Two times a day (BID) | RESPIRATORY_TRACT | 3 refills | Status: AC

## 2024-04-07 NOTE — Telephone Encounter (Signed)
 Pharmacy Patient Advocate Encounter  Received notification from [employer] that Prior Authorization for Zepbound  2.5MG /0.5ML pen-injectors  has been APPROVED from 60/26/2025 to 12/02/2024. Ran test claim, Copay is $24.99. This test claim was processed through [facility]- copay amounts may vary at other pharmacies due to pharmacy/plan contracts, or as the patient moves through the different stages of their insurance plan.   [employer] , the mfr of ZEPBOUND  2.5 MG/0.5 ML paid 65.01 toward your plan copay. $8115.00 out of $1950.00 in benefits remaining.

## 2024-04-07 NOTE — Telephone Encounter (Signed)
 Copied from CRM [id number]. Topic: Clinical - Medication Question >> Apr 07, 2024 10:54 AM Devaughn RAMAN wrote: Reason for CRM: Patient called regarding her budesonide -formoterol  (SYMBICORT ) 160-4.5 MCG/ACT inhaler medication. Patient stated it is $150, but if she could switch it to a 90 day supply, patient stated it is the only way her insurance will pay for it and she pays $10 a month instead of $153. Patient was thankful and verbalized understanding.   I called and spoke to pt. Pt states as of right now she is does need a rx for Symbicort  as a 3 month supply. I informed pt that [facility] [place]. Pt verbalized understanding. NFN

## 2024-04-10 ENCOUNTER — Other Ambulatory Visit: Payer: Self-pay

## 2024-04-10 DIAGNOSIS — R0602 Shortness of breath: Secondary | ICD-10-CM

## 2024-04-11 ENCOUNTER — Ambulatory Visit: Admitting: Pulmonary Disease

## 2024-04-11 DIAGNOSIS — R0602 Shortness of breath: Secondary | ICD-10-CM | POA: Diagnosis not present

## 2024-04-11 LAB — PULMONARY FUNCTION TEST
DL/VA % pred: 120 %
DL/VA: 5.07 ml/min/mmHg/L
DLCO unc % pred: 136 %
DLCO unc: 27.54 ml/min/mmHg
FEF 25-75 Post: 2.95 L/s
FEF 25-75 Pre: 2.78 L/s
FEF2575-%Change-Post: 5 %
FEF2575-%Pred-Post: 123 %
FEF2575-%Pred-Pre: 116 %
FEV1-%Change-Post: 1 %
FEV1-%Pred-Post: 112 %
FEV1-%Pred-Pre: 110 %
FEV1-Post: 2.9 L
FEV1-Pre: 2.85 L
FEV1FVC-%Change-Post: 1 %
FEV1FVC-%Pred-Pre: 101 %
FEV6-%Change-Post: 0 %
FEV6-%Pred-Post: 111 %
FEV6-%Pred-Pre: 110 %
FEV6-Post: 3.58 L
FEV6-Pre: 3.57 L
FEV6FVC-%Change-Post: 0 %
FEV6FVC-%Pred-Post: 103 %
FEV6FVC-%Pred-Pre: 103 %
FVC-%Change-Post: 0 %
FVC-%Pred-Post: 107 %
FVC-%Pred-Pre: 107 %
FVC-Post: 3.58 L
FVC-Pre: 3.58 L
Post FEV1/FVC ratio: 81 %
Post FEV6/FVC ratio: 100 %
Pre FEV1/FVC ratio: 80 %
Pre FEV6/FVC Ratio: 100 %
RV % pred: 121 %
RV: 2.38 L
TLC % pred: 118 %
TLC: 5.97 L

## 2024-04-11 NOTE — Progress Notes (Signed)
 Full PFT completed today ? ?

## 2024-04-11 NOTE — Patient Instructions (Signed)
 Full PFT completed today ? ?

## 2024-04-20 ENCOUNTER — Ambulatory Visit: Admitting: Pulmonary Disease

## 2024-04-20 ENCOUNTER — Encounter: Payer: Self-pay | Admitting: Pulmonary Disease

## 2024-04-20 VITALS — BP 110/62 | HR 87 | Temp 96.9°F | Ht 64.0 in | Wt 275.8 lb

## 2024-04-20 DIAGNOSIS — R0602 Shortness of breath: Secondary | ICD-10-CM | POA: Diagnosis not present

## 2024-04-20 NOTE — Progress Notes (Signed)
 Synopsis: Referred in by Gretta Comer POUR, NP   Subjective:   PATIENT ID: Virginia Oneal: female DOB: April 10, 1966, MRN: [id number]  Chief Complaint  Patient presents with   Follow-up    DOE. No wheezing or cough.    HPI Virginia Oneal is a 58 year old female patient with a past medical history of non-Hodgkin B-cell lymphoma diagnosed in 2022 by surgical biopsy of vertebral body status post R-CHOP chemoimmunotherapy currently in remission presenting today to the pulmonary clinic with ongoing shortness of breath on exertion.   She reports that for the last year and after she was finished with chemotherapy she started having shortness of breath on exertion.  It is described as chest tightness when walking.  She used to walk without any issues however now she needs to rest after few steps.   She denies any wheezing cough with sputum production.  She does report COVID pneumonia several times.  She is not on any inhaler and never had the diagnosis of asthma.  She was never hospitalized for pulmonary issues.   She does not have eczema but does report seasonal allergies which makes her break out in an itchy rash.  She denies any hypersensitivity to heat or cold or strong perfumes.   CT chest 11/16/2023 with biapical pleural-parenchymal scarring and scattered atelectasis.   Family history -sister with asthma   Social history -used to smoke 1 pack/day for 7 years.  Denies any alcohol or vaping.  She is a stay-at-home mom.  Has no pets at home.   OV 04/20/2024 - Virginia Oneal is here to follow up on her PFT results that were normal. Unfortunately she had a minimal response with symbicort . She conitnues to experience shortness of breath on exertion. She denies shortness of breath at rest. Cardiac work up has been negative. Will plan obtaining a MCT to confirm diagnosis of asthma. Should that not result in a diagnosis we will proceed with a CPET study.   ROS All systems were reviewed and are  negative except for the above  Objective:   Vitals:   04/20/24 1121  BP: 110/62  Pulse: 87  Temp: (!) 96.9 F (36.1 C)  SpO2: 96%  Weight: 275 lb 12.8 oz (125.1 kg)  Height: 5' 4 (1.626 m)   96% on RA BMI Readings from Last 3 Encounters:  04/20/24 47.34 kg/m  04/11/24 47.41 kg/m  03/31/24 47.38 kg/m   Wt Readings from Last 3 Encounters:  04/20/24 275 lb 12.8 oz (125.1 kg)  04/11/24 276 lb 3.2 oz (125.3 kg)  03/31/24 276 lb (125.2 kg)    Physical Exam GEN: NAD, Healthy Appearing HEENT: Supple Neck, Reactive Pupils, EOMI  CVS: Normal S1, Normal S2, RRR, No murmurs or ES appreciated  Lungs: Clear bilateral air entry.  Abdomen: Soft, non tender, non distended, + BS  Extremities: Warm and well perfused, No edema  Skin: No suspicious lesions appreciated  Psych: Normal Affect  Ancillary Information   CBC    Component Value Date/Time   WBC 6.1 11/24/2023 1132   WBC 6.7 08/20/2021 1442   RBC 4.39 11/24/2023 1132   HGB 13.8 11/24/2023 1132   HGB 14.2 11/18/2020 1607   HGB 13.8 02/02/2014 1145   HCT 41.4 11/24/2023 1132   HCT 43.3 11/18/2020 1607   PLT 162 11/24/2023 1132   PLT 165 11/18/2020 1607   MCV 94.3 11/24/2023 1132   MCV 88 11/18/2020 1607   MCH 31.4 11/24/2023 1132   MCHC 33.3 11/24/2023 1132  RDW 12.6 11/24/2023 1132   RDW 12.5 11/18/2020 1607   LYMPHSABS 1.9 11/24/2023 1132   LYMPHSABS 1.9 11/18/2020 1607   MONOABS 0.6 11/24/2023 1132   EOSABS 0.1 11/24/2023 1132   EOSABS 0.3 11/18/2020 1607   BASOSABS 0.0 11/24/2023 1132   BASOSABS 0.0 11/18/2020 1607        Latest Ref Rng & Units 04/11/2024   10:41 AM  PFT Results  FVC-Pre L 3.58  P  FVC-Predicted Pre % 107  P  FVC-Post L 3.58  P  FVC-Predicted Post % 107  P  Pre FEV1/FVC % % 80  P  Post FEV1/FCV % % 81  P  FEV1-Pre L 2.85  P  FEV1-Predicted Pre % 110  P  FEV1-Post L 2.90  P  DLCO uncorrected ml/min/mmHg 27.54  P  DLCO UNC% % 136  P  DLVA Predicted % 120  P  TLC L 5.97  P  TLC  % Predicted % 118  P  RV % Predicted % 121  P    P Preliminary result     Assessment & Plan:  Virginia Oneal is a 58 year old female patient with a past medical history of non-Hodgkin B-cell lymphoma diagnosed in 2022 by surgical biopsy of vertebral body status post R-CHOP chemoimmunotherapy currently in remission presenting today to the pulmonary clinic with ongoing shortness of breath on exertion.   My impression of what we are dealing with is post COVID new onset obstructive airway disease.  Her CT scan does not show any parenchymal disease to explain her shortness of breath.  Echocardiogram in 2024 with normal EF, grade 1 diastolic dysfunction and normal RV function which does not explain her significant dyspnea.  Long-term cardiac monitor did not reveal any arrhythmias in 2024.   PFTs were normal and trial of ICS LABA was suboptimal.  Eosinophil count 100. I will obtain a MCT for diangostic purposes and should that be unrevealing, we will obtain a CPET study.    RTC 3 monhts.   I spent 30 minutes caring for this patient today, including preparing to see the patient, obtaining a medical history , reviewing a separately obtained history, performing a medically appropriate examination and/or evaluation, counseling and educating the patient/family/caregiver, documenting clinical information in the electronic health record, and independently interpreting results (not separately reported/billed) and communicating results to the patient/family/caregiver  Darrin Barn, MD [facility] 04/20/2024 11:25 AM

## 2024-05-12 ENCOUNTER — Other Ambulatory Visit: Payer: Self-pay | Admitting: Primary Care

## 2024-05-12 DIAGNOSIS — R232 Flushing: Secondary | ICD-10-CM

## 2024-05-23 ENCOUNTER — Other Ambulatory Visit: Payer: Self-pay

## 2024-05-23 DIAGNOSIS — C8330 Diffuse large B-cell lymphoma, unspecified site: Secondary | ICD-10-CM

## 2024-05-24 ENCOUNTER — Inpatient Hospital Stay: Attending: Hematology

## 2024-05-24 ENCOUNTER — Inpatient Hospital Stay: Payer: 59 | Admitting: Hematology

## 2024-05-24 ENCOUNTER — Inpatient Hospital Stay (HOSPITAL_BASED_OUTPATIENT_CLINIC_OR_DEPARTMENT_OTHER): Admitting: Hematology

## 2024-05-24 ENCOUNTER — Inpatient Hospital Stay: Payer: 59

## 2024-05-24 VITALS — BP 140/80 | HR 87 | Temp 97.2°F | Resp 20 | Wt 276.8 lb

## 2024-05-24 DIAGNOSIS — Z87891 Personal history of nicotine dependence: Secondary | ICD-10-CM | POA: Insufficient documentation

## 2024-05-24 DIAGNOSIS — Z8572 Personal history of non-Hodgkin lymphomas: Secondary | ICD-10-CM | POA: Diagnosis not present

## 2024-05-24 DIAGNOSIS — C8330 Diffuse large B-cell lymphoma, unspecified site: Secondary | ICD-10-CM

## 2024-05-24 DIAGNOSIS — Z08 Encounter for follow-up examination after completed treatment for malignant neoplasm: Secondary | ICD-10-CM | POA: Diagnosis present

## 2024-05-24 DIAGNOSIS — Z8051 Family history of malignant neoplasm of kidney: Secondary | ICD-10-CM | POA: Insufficient documentation

## 2024-05-24 DIAGNOSIS — Z9221 Personal history of antineoplastic chemotherapy: Secondary | ICD-10-CM | POA: Diagnosis not present

## 2024-05-24 DIAGNOSIS — Z800 Family history of malignant neoplasm of digestive organs: Secondary | ICD-10-CM | POA: Diagnosis not present

## 2024-05-24 DIAGNOSIS — R252 Cramp and spasm: Secondary | ICD-10-CM | POA: Insufficient documentation

## 2024-05-24 DIAGNOSIS — Z803 Family history of malignant neoplasm of breast: Secondary | ICD-10-CM | POA: Insufficient documentation

## 2024-05-24 DIAGNOSIS — M549 Dorsalgia, unspecified: Secondary | ICD-10-CM | POA: Insufficient documentation

## 2024-05-24 DIAGNOSIS — D696 Thrombocytopenia, unspecified: Secondary | ICD-10-CM | POA: Insufficient documentation

## 2024-05-24 LAB — CBC WITH DIFFERENTIAL (CANCER CENTER ONLY)
Abs Immature Granulocytes: 0.02 K/uL (ref 0.00–0.07)
Basophils Absolute: 0 K/uL (ref 0.0–0.1)
Basophils Relative: 0 %
Eosinophils Absolute: 0.1 K/uL (ref 0.0–0.5)
Eosinophils Relative: 1 %
HCT: 38.6 % (ref 36.0–46.0)
Hemoglobin: 13.3 g/dL (ref 12.0–15.0)
Immature Granulocytes: 0 %
Lymphocytes Relative: 26 %
Lymphs Abs: 1.4 K/uL (ref 0.7–4.0)
MCH: 31.7 pg (ref 26.0–34.0)
MCHC: 34.5 g/dL (ref 30.0–36.0)
MCV: 92.1 fL (ref 80.0–100.0)
Monocytes Absolute: 0.4 K/uL (ref 0.1–1.0)
Monocytes Relative: 8 %
Neutro Abs: 3.6 K/uL (ref 1.7–7.7)
Neutrophils Relative %: 65 %
Platelet Count: 141 K/uL — ABNORMAL LOW (ref 150–400)
RBC: 4.19 MIL/uL (ref 3.87–5.11)
RDW: 13 % (ref 11.5–15.5)
WBC Count: 5.5 K/uL (ref 4.0–10.5)
nRBC: 0 % (ref 0.0–0.2)

## 2024-05-24 LAB — CMP (CANCER CENTER ONLY)
ALT: 31 U/L (ref 0–44)
AST: 22 U/L (ref 15–41)
Albumin: 4.2 g/dL (ref 3.5–5.0)
Alkaline Phosphatase: 98 U/L (ref 38–126)
Anion gap: 7 (ref 5–15)
BUN: 18 mg/dL (ref 6–20)
CO2: 31 mmol/L (ref 22–32)
Calcium: 9.6 mg/dL (ref 8.9–10.3)
Chloride: 104 mmol/L (ref 98–111)
Creatinine: 0.74 mg/dL (ref 0.44–1.00)
GFR, Estimated: >60 mL/min (ref >60)
Glucose, Bld: 144 mg/dL — ABNORMAL HIGH (ref 70–99)
Potassium: 4.1 mmol/L (ref 3.5–5.1)
Sodium: 142 mmol/L (ref 135–145)
Total Bilirubin: 0.5 mg/dL (ref 0.0–1.2)
Total Protein: 6.6 g/dL (ref 6.5–8.1)

## 2024-05-24 LAB — LACTATE DEHYDROGENASE: LDH: 194 U/L — ABNORMAL HIGH (ref 98–192)

## 2024-05-27 ENCOUNTER — Other Ambulatory Visit: Payer: Self-pay | Admitting: Primary Care

## 2024-05-27 DIAGNOSIS — I10 Essential (primary) hypertension: Secondary | ICD-10-CM

## 2024-05-30 ENCOUNTER — Encounter: Payer: Self-pay | Admitting: Hematology

## 2024-05-30 ENCOUNTER — Ambulatory Visit (INDEPENDENT_AMBULATORY_CARE_PROVIDER_SITE_OTHER): Admitting: Primary Care

## 2024-05-30 ENCOUNTER — Encounter: Payer: Self-pay | Admitting: Primary Care

## 2024-05-30 VITALS — BP 128/84 | HR 74 | Temp 97.2°F | Ht 64.0 in | Wt 275.0 lb

## 2024-05-30 DIAGNOSIS — I10 Essential (primary) hypertension: Secondary | ICD-10-CM | POA: Diagnosis not present

## 2024-05-30 DIAGNOSIS — R0609 Other forms of dyspnea: Secondary | ICD-10-CM

## 2024-05-30 DIAGNOSIS — Z0000 Encounter for general adult medical examination without abnormal findings: Secondary | ICD-10-CM | POA: Diagnosis not present

## 2024-05-30 DIAGNOSIS — C8330 Diffuse large B-cell lymphoma, unspecified site: Secondary | ICD-10-CM

## 2024-05-30 DIAGNOSIS — Z800 Family history of malignant neoplasm of digestive organs: Secondary | ICD-10-CM

## 2024-05-30 DIAGNOSIS — F419 Anxiety disorder, unspecified: Secondary | ICD-10-CM

## 2024-05-30 DIAGNOSIS — E785 Hyperlipidemia, unspecified: Secondary | ICD-10-CM

## 2024-05-30 DIAGNOSIS — E1165 Type 2 diabetes mellitus with hyperglycemia: Secondary | ICD-10-CM | POA: Diagnosis not present

## 2024-05-30 DIAGNOSIS — Z1231 Encounter for screening mammogram for malignant neoplasm of breast: Secondary | ICD-10-CM

## 2024-05-30 LAB — LIPID PANEL
Cholesterol: 136 mg/dL (ref 0–200)
HDL: 59.4 mg/dL (ref >39.00)
LDL Cholesterol: 60 mg/dL (ref 0–99)
NonHDL: 76.98
Total CHOL/HDL Ratio: 2
Triglycerides: 85 mg/dL (ref 0.0–149.0)
VLDL: 17 mg/dL (ref 0.0–40.0)

## 2024-05-30 LAB — MICROALBUMIN / CREATININE URINE RATIO
Creatinine,U: 95.7 mg/dL
Microalb Creat Ratio: 7.9 mg/g (ref 0.0–30.0)
Microalb, Ur: 0.8 mg/dL (ref 0.0–1.9)

## 2024-05-30 LAB — HEMOGLOBIN A1C: Hgb A1c MFr Bld: 6.7 % — ABNORMAL HIGH (ref 4.6–6.5)

## 2024-05-30 NOTE — Assessment & Plan Note (Signed)
 Controlled.   Continue losartan -hydrochlorothiazide  50-12.5 mg daily, metoprolol  succinate 25 mg daily. CMP reviewed from August 2025

## 2024-05-30 NOTE — Assessment & Plan Note (Signed)
 Repeat A1C pending.   Remain off treatment. Urine microalbumin pending.

## 2024-05-30 NOTE — Assessment & Plan Note (Signed)
 Overall controlled.  Continue hydroxyzine  10 mg as needed.   Continue venlafaxine  ER 75 mg daily.

## 2024-05-30 NOTE — Assessment & Plan Note (Signed)
 Colonoscopy up-to-date, due 2029.

## 2024-05-30 NOTE — Patient Instructions (Signed)
 Stop by the lab prior to leaving today. I will notify you of your results once received.   [facility].   Please schedule a follow up visit for 6 months for a diabetes check.  It was a pleasure to see you today!

## 2024-05-30 NOTE — Assessment & Plan Note (Signed)
 Following with oncology, labs reviewed from August 2025. Chest abdomen pelvis CT reviewed from February 2025.

## 2024-05-30 NOTE — Progress Notes (Signed)
 HEMATOLOGY/ONCOLOGY CLINIC NOTE  Date of Service: .05/24/2024   Patient Care Team: Gretta Comer POUR, NP as PCP - General (Internal Medicine) Colon Shove, MD as Consulting Physician (Neurosurgery) Ebbie Cough, MD as Consulting Physician (General Surgery) Jess Devona BIRCH, MD as Consulting Physician (Sleep Medicine)  CHIEF COMPLAINTS/PURPOSE OF CONSULTATION:  Follow-up for for evaluation and management and active surveillance of large B-cell lymphoma  HISTORY OF PRESENTING ILLNESS:  Please see previous note for details of initial presentation  INTERVAL HISTORY:  Virginia Oneal is a 58 y.o. female is here for follow-up of her diffuse large B-cell lymphoma.  Patient was last seen by us  about 6 months ago at which time in February she had a CT chest abdomen pelvis that showed no evidence of active lymphoma. She notes no new fevers, chills, night sweats, unexpected weight loss. No new lumps or bumps. Notes some intermittent low back pain and paraspinal muscle spasm which she thinks is likely from arthritis.  No focal bone pain along the spine or tenderness to palpation. No chest pain or shortness of breath.  No abdominal pain or distention.  No change in bowel habits.  MEDICAL HISTORY:  Past Medical History:  Diagnosis Date   Abnormal uterine bleeding    Anemia    as a teenager   Anxiety    postpartum and hx panic attacks   Arrhythmia    Arthritis    back and hands   b cell lymphoma 08/2021   B cell   CAD (coronary artery disease) 07/22/2023   Chest mass    by CT scanning   Chest pain 07/26/2014   Complication of anesthesia    slow to wake up   COVID-19 10/2020   Depression    DOE (dyspnea on exertion) 07/26/2014   Family history of adverse reaction to anesthesia    mom slow to wake up   Gestational diabetes    resolved after pregnancy   Gross hematuria 11/20/2020   History of kidney stones    Hypertension    Morbid (severe) obesity due to excess  calories (HCC) 07/22/2023   bmi 44.29   Personal history of colonic polyps    PONV (postoperative nausea and vomiting)    PREMATURE ATRIAL CONTRACTIONS 08/15/2008   Qualifier: Diagnosis of  By: Bartley MD, Lamar Mulch    RENAL CALCULUS, HX OF 08/15/2008   Qualifier: Diagnosis of  By: Judyth LPN, Wanda     Rotator cuff tendinitis 06/29/2011   Seasonal allergies    Sleep apnea    Urinary incontinence 05/2009   hx bladder sling--Dr. Nikki    SURGICAL HISTORY: Past Surgical History:  Procedure Laterality Date   ABDOMINAL HYSTERECTOMY  05/2009   TAH/LSO   BLADDER SUSPENSION  2010   Dr. Nikki   BREAST BIOPSY     left stereo   CESAREAN SECTION  03/2003   CESAREAN SECTION  2004   CHOLECYSTECTOMY N/A 01/02/2021   Procedure: LAPAROSCOPIC CHOLECYSTECTOMY;  Surgeon: Ebbie Cough, MD;  Location: [facility];  Service: General;  Laterality: N/A;  58  - GEN AND TAP BLOCK LDOW   COLONOSCOPY  09/08   COLPORRHAPHY  05/2009   Dr. Nikki   ENDOMETRIAL ABLATION  2006   Dr. Nikki   INCONTINENCE SURGERY     sling for urinary incontinence   IR IMAGING GUIDED PORT INSERTION  09/01/2021   IR REMOVAL TUN ACCESS W/ PORT W/O FL MOD SED  10/29/2022   KYPHOPLASTY N/A 04/07/2021   Procedure:  Thoracic 12 Vertebral body biopsy;  Surgeon: Colon Shove, MD;  Location: [facility] OR;  Service: Neurosurgery;  Laterality: N/A;   LAPAROSCOPIC ENDOMETRIOSIS FULGURATION     ablation   LUMBAR LAMINECTOMY/DECOMPRESSION MICRODISCECTOMY N/A 07/31/2021   Procedure: Open Transpedicular biopsy of Thoracic twelve;  Surgeon: Colon Shove, MD;  Location: [facility] OR;  Service: Neurosurgery;  Laterality: N/A;   OOPHORECTOMY  2010   w/hyst--Dr. Nikki   TUBAL LIGATION  2004   w/ C-section Dr. Nikki   TYMPANOPLASTY     right ear   VAGINAL HYSTERECTOMY  05/21/2009   lap assisted /partial    SOCIAL HISTORY: Social History   Socioeconomic History   Marital status: Married    Spouse name: Not on file   Number of children: 2   Years  of education: Not on file   Highest education level: Associate degree: occupational, Scientist, product/process development, or vocational program  Occupational History   Occupation: [employer]  Tobacco Use   Smoking status: Former    Current packs/day: 0.00    Types: Cigarettes    Start date: 01/05/1989    Quit date: 01/06/1999    Years since quitting: 25.4   Smokeless tobacco: Never  Vaping Use   Vaping status: Never Used  Substance and Sexual Activity   Alcohol use: No    Alcohol/week: 0.0 standard drinks of alcohol   Drug use: No   Sexual activity: Yes    Partners: Male    Birth control/protection: Surgical    Comment: TAH/LSO  Other Topics Concern   Not on file  Social History Narrative   ** Merged History Encounter **       Social Drivers of Health   Financial Resource Strain: Low Risk  (05/29/2024)   Overall Financial Resource Strain (CARDIA)    Difficulty of Paying Living Expenses: Not hard at all  Food Insecurity: No Food Insecurity (05/29/2024)   Hunger Vital Sign    Worried About Running Out of Food in the Last Year: Never true    Ran Out of Food in the Last Year: Never true  Transportation Needs: No Transportation Needs (05/29/2024)   PRAPARE - Administrator, Civil Service (Medical): No    Lack of Transportation (Non-Medical): No  Physical Activity: Insufficiently Active (05/29/2024)   Exercise Vital Sign    Days of Exercise per Week: 1 day    Minutes of Exercise per Session: 20 min  Stress: No Stress Concern Present (05/29/2024)   [employer] of Occupational Health - Occupational Stress Questionnaire    Feeling of Stress: Only a little  Social Connections: Socially Integrated (05/29/2024)   Social Connection and Isolation Panel    Frequency of Communication with Friends and Family: More than three times a week    Frequency of Social Gatherings with Friends and Family: Three times a week    Attends Religious Services: More than 4 times per year    Active Member of  Clubs or Organizations: Yes    Attends Banker Meetings: More than 4 times per year    Marital Status: Married  Catering manager Violence: Unknown (01/15/2022)   Received from [facility]   HITS    Physically Hurt: Not on file    Insult or Talk Down To: Not on file    Threaten Physical Harm: Not on file    Scream or Curse: Not on file    FAMILY HISTORY: Family History  Problem Relation Age of Onset   Kidney disease Mother  renal failure   Stroke Mother    Diabetes Mother    Hypertension Mother    Heart failure Mother    Colon cancer Mother    Stroke Father    Hypertension Sister    Rheum arthritis Sister    Hypertension Sister    Hypertension Sister    Hypertension Sister    Breast cancer Sister    Kidney cancer Sister    Hypertension Brother    Cancer - Colon Maternal Aunt    Esophageal cancer Neg Hx    Rectal cancer Neg Hx    Stomach cancer Neg Hx    Colon polyps Neg Hx     ALLERGIES:  is allergic to buspar  [buspirone  hcl].  MEDICATIONS:  Current Outpatient Medications  Medication Sig Dispense Refill   acetaminophen  (TYLENOL ) 500 MG tablet Take 1,000 mg by mouth every 6 (six) hours as needed for moderate pain or headache.     albuterol  (VENTOLIN  HFA) 108 (90 Base) MCG/ACT inhaler TAKE 2 PUFFS BY MOUTH EVERY 6 HOURS AS NEEDED FOR WHEEZE OR SHORTNESS OF BREATH 8.5 each 2   aspirin  EC 81 MG tablet Take 1 Tablet by Mouth Daily Swallow whole. 90 tablet 1   b complex vitamins capsule Take 1 capsule by mouth daily.     betamethasone  valerate (VALISONE ) 0.1 % cream Apply topically as needed.     budesonide -formoterol  (SYMBICORT ) 160-4.5 MCG/ACT inhaler Inhale 2 puffs into the lungs in the morning and at bedtime. 3 each 3   cetirizine (ZYRTEC) 10 MG tablet Take 10 mg by mouth daily.      Cholecalciferol (VITAMIN D3) 125 MCG (5000 UT) CAPS Take 5,000 Units by mouth daily.     hydrOXYzine  (ATARAX ) 10 MG tablet Take 1 or 2 tablets by mouth as needed for  anxiety attack. 15 tablet 0   losartan -hydrochlorothiazide  (HYZAAR) 50-12.5 MG tablet TAKE 1 TABLET BY MOUTH DAILY FOR BLOOD PRESSURE 90 tablet 0   Methylcobalamin (B-12) 5000 MCG TBDP Take 5,000 mcg by mouth daily.     metoprolol  succinate (TOPROL  XL) 25 MG 24 hr tablet Take 1 tablet (25 mg total) by mouth daily. 90 tablet 1   rosuvastatin  (CRESTOR ) 5 MG tablet Take 1 tablet (5 mg total) by mouth every evening. 90 tablet 1   venlafaxine  XR (EFFEXOR -XR) 75 MG 24 hr capsule TAKE 1 CAPSULE (75 MG TOTAL) BY MOUTH DAILY WITH BREAKFAST. FOR HOT FLASHES AND ANXIETY 90 capsule 0   No current facility-administered medications for this visit.    REVIEW OF SYSTEMS:    .10 Point review of Systems was done is negative except as noted above.  PHYSICAL EXAMINATION: .BP (!) 140/80   Pulse 87   Temp (!) 97.2 F (36.2 C)   Resp 20   Wt 276 lb 12.8 oz (125.6 kg)   SpO2 96%   BMI 47.51 kg/m   . GENERAL:alert, in no acute distress and comfortable SKIN: no acute rashes, no significant lesions EYES: conjunctiva are pink and non-injected, sclera anicteric OROPHARYNX: MMM, no exudates, no oropharyngeal erythema or ulceration NECK: supple, no JVD LYMPH:  no palpable lymphadenopathy in the cervical, axillary or inguinal regions LUNGS: clear to auscultation b/l with normal respiratory effort HEART: regular rate & rhythm ABDOMEN:  normoactive bowel sounds , non tender, not distended.  No palpable hepatosplenomegaly Extremity: no pedal edema PSYCH: alert & oriented x 3 with fluent speech NEURO: no focal motor/sensory deficits   LABORATORY DATA:  I have reviewed the data as listed  .  Latest Ref Rng & Units 05/24/2024    8:41 AM 11/24/2023   11:32 AM 08/24/2023    9:34 AM  CBC  WBC 4.0 - 10.5 K/uL 5.5  6.1  6.2   Hemoglobin 12.0 - 15.0 g/dL 86.6  86.1  86.7   Hematocrit 36.0 - 46.0 % 38.6  41.4  39.8   Platelets 150 - 400 K/uL 141  162  143    .    Latest Ref Rng & Units 05/24/2024    8:41  AM 11/24/2023   11:32 AM 08/24/2023    9:34 AM  CMP  Glucose 70 - 99 mg/dL 855  887  879   BUN 6 - 20 mg/dL 18  17  15    Creatinine 0.44 - 1.00 mg/dL 9.25  9.25  9.22   Sodium 135 - 145 mmol/L 142  139  141   Potassium 3.5 - 5.1 mmol/L 4.1  4.4  4.1   Chloride 98 - 111 mmol/L 104  101  105   CO2 22 - 32 mmol/L 31  32  31   Calcium  8.9 - 10.3 mg/dL 9.6  9.7  9.4   Total Protein 6.5 - 8.1 g/dL 6.6  7.1  6.4   Total Bilirubin 0.0 - 1.2 mg/dL 0.5  0.5  0.4   Alkaline Phos 38 - 126 U/L 98  89  98   AST 15 - 41 U/L 22  24  19    ALT 0 - 44 U/L 31  30  26     Lab Results  Component Value Date   LDH 194 (H) 05/24/2024    HIV Screen 4th Generation wRfx     Non Reactive Non Reactive  Hepatitis B Surface Ag     NON REACTIVE NON REACTIVE  Hep B Core Total Ab     NON REACTIVE NON REACTIVE  HCV Ab     NON REACTIVE NON REACTIVE  LDH     98 - 192 U/L 228 (H)    SURGICAL PATHOLOGY  CASE: [id number]  PATIENT: Virginia Oneal  Surgical Pathology Report   Clinical History: lesion of thoracic vertebra (cm)   FINAL MICROSCOPIC DIAGNOSIS:   A-C. BONE, T12 VERTEBRAL BODY AND MARROW, BIOPSY:  -  Non-Hodgkin B-cell lymphoma, high grade  -  See comment   COMMENT:   A-C.  The biopsies show normal hematopoeisis admixed with crush artifact  with associated marrow fibrosis and an atypical cellular infiltrate  composed of medium to large cells with irregular nuclear contours, ample  cytoplasm, variably prominent nucleoli, increased mitoses and apoptosis.  An initial panel of immunohistochemistry (cytokeratin AE1/3, cytokeratin  7, cytokeratin 20, CD20 and CD3) confirmed the morphologic impression  that the atypical cells were lymphoid in nature.  The atypical  lymphocytes are positive for Pax-5, CD20, CD10, bcl-6 but negative for  CD30, bcl-2, mum-1 and EBV by in-situ hybridization. CD3 and CD5  highlight background T-cells.  The proliferative rate by ki-67 is  increased (50-60%).  Overall,  the findings are consistent with a  non-Hodgkin B-cell lymphoma, high grade with a germinal center  phenotype.   FISH studies are pending and will be reported in an  addendum.  These findings were discussed with Dr. Colon on August 06, 2021.    RADIOGRAPHIC STUDIES: I have personally reviewed the radiological images as listed and agreed with the findings in the report.  ECHO 09/08/2021 1.Left ventricular ejection fraction, by estimation, is 55 to 60%. The left ventricle has normal  function. The left ventricle has no regional wall motion abnormalities. There ismild left ventricular hypertrophy. Left ventricular diastolic parameters were normal.The average left ventricular global longitudinal strain is -20.4 %. The global longitudinal strain is normal. 2. Right ventricular systolic function is normal. The right ventricular size is normal. 3. Left atrial size was mildly dilated. The mitral valve is normal in structure. Trivial mitral valve regurgitation. No evidence of mitral stenosis. Moderate mitral annular calcification. 4.The aortic valve is normal in structure. Aortic valve regurgitation is not visualized. No aortic stenosis is present. 5.The inferior vena cava is normal in size with greater than 50% respiratory variability, suggesting right atrial pressure of 3 mmHg.  PET/CT scan 09/09/2022  IMPRESSION: 1. No specific findings of active malignancy. Previous low-grade activity in the T12 vertebral body has resolved. 2. Focal accentuated activity in the distal esophagus with maximum SUV 4.4, likely physiologic, no CT correlate or significant accentuated activity on the prior exam in this region. 3. Hepatic steatosis. 4. Nonobstructive bilateral nephrolithiasis. 5. Aortic and coronary atherosclerosis.  Mitral valve calcification.   ASSESSMENT & PLAN:   58 year old female with  1) history of stage IVA high-grade large B cell non-Hodgkin's lymphoma.  Germinal center phenotype.   C-Myc not rearranged.  Ki-67 50 to 60% No overt constitutional symptoms PET/CT with involvement of thoracic spine and acromion process on the right.  Abdominal lymphadenopathy.  Hepatitis testing unremarkable HIV nonreactive Myeloma panel shows no M spike protein spike.  Echocardiogram shows normal ejection fraction  PET CT scan done on 09/02/2021 shows Scattered mild hypermetabolism within subcentimeter periportal, retroperitoneal and mesenteric lymph nodes, compatible with the given history of large B-cell lymphoma. 2. Hypermetabolism within known T7 and T12 lesions, better seen on MR thoracic spine 07/04/2021. 3. Focal hypermetabolism in the acromion of the right scapula, without a definite CT correlate. Recommend attention on follow-up.  2) history of chemotherapy related neutropenia -now resolved.   3)h/o  G-CSF related significant bone pain.  PLAN:  - Discussed patient's labs from today which show CBC with minimal thrombocytopenia of 141k CMP is stable LDH is normal  Patient has no lab findings or clinical symptoms or signs suggestive of lymphoma recurrence/progression at this time She continues to have chronic back pain and paraspinal muscle spasms. We discussed the option of getting MRI for thoracolumbar spine.  She wants to hold off on this and notes that she will get in touch with her spine specialist Dr. Colon. - Continue to speed with age-appropriate cancer screening and vaccinations with PCP -Patient knows what symptoms to watch out for and will call us  if there are any worsening back pains or any other new concerns.  FOLLOW-UP: RTC with Dr Onesimo with labs in 6 months  The total time spent in the appointment was 30 minutes*.  All of the patient's questions were answered with apparent satisfaction. The patient knows to call the clinic with any problems, questions or concerns.   Emaline Onesimo MD MS AAHIVMS [facility] Hematology/Oncology Physician [facility]  .*Total Encounter Time as defined by the Centers for Medicare and Medicaid Services includes, in addition to the face-to-face time of a patient visit (documented in the note above) non-face-to-face time: obtaining and reviewing outside history, ordering and reviewing medications, tests or procedures, care coordination (communications with other health care professionals or caregivers) and documentation in the medical record.

## 2024-05-30 NOTE — Assessment & Plan Note (Signed)
 Immunizations UTD. Mammogram due, orders placed. Colonoscopy UTD, due 2029  Discussed the importance of a healthy diet and regular exercise in order for weight loss, and to reduce the risk of further co-morbidity.  Exam stable. Labs pending.  Follow up in 1 year for repeat physical.

## 2024-05-30 NOTE — Progress Notes (Signed)
 Subjective:    Patient ID: Virginia Oneal, female    DOB: 1965-11-30, 58 y.o.   MRN: [id number]   History of Present Illness    Virginia Oneal is a very pleasant 58 y.o. female who presents today for complete physical and follow up of chronic conditions.  Immunizations: -Tetanus: Completed in 2020  -Shingles: Completed Shingrix series  Diet: Fair diet.  Exercise: No regular exercise.  Eye exam: Completes annually  Dental exam: Completes semi-annually    Pap Smear: Hysterectomy  Mammogram: Completed in September 2024   Colonoscopy: Completed in 2024, due 2029  BP Readings from Last 3 Encounters:  05/30/24 128/84  05/24/24 (!) 140/80  04/20/24 110/62          Review of Systems  Constitutional:  Negative for unexpected weight change.  HENT:  Negative for rhinorrhea.   Respiratory:  Positive for shortness of breath. Negative for cough.   Cardiovascular:  Negative for chest pain.  Gastrointestinal:  Negative for constipation and diarrhea.  Genitourinary:  Negative for difficulty urinating.  Musculoskeletal:  Negative for arthralgias and myalgias.  Skin:  Negative for rash.  Allergic/Immunologic: Negative for environmental allergies.  Neurological:  Negative for dizziness and headaches.  Psychiatric/Behavioral:  The patient is not nervous/anxious.          Past Medical History:  Diagnosis Date   Abnormal uterine bleeding    Anemia    as a teenager   Anxiety    postpartum and hx panic attacks   Arrhythmia    Arthritis    back and hands   b cell lymphoma 08/2021   B cell   CAD (coronary artery disease) 07/22/2023   Chest mass    by CT scanning   Chest pain 07/26/2014   Complication of anesthesia    slow to wake up   COVID-19 10/2020   Depression    DOE (dyspnea on exertion) 07/26/2014   Family history of adverse reaction to anesthesia    mom slow to wake up   Gestational diabetes    resolved after pregnancy   Gross hematuria 11/20/2020    History of kidney stones    Hypertension    Morbid (severe) obesity due to excess calories (HCC) 07/22/2023   bmi 44.29   Personal history of colonic polyps    PONV (postoperative nausea and vomiting)    PREMATURE ATRIAL CONTRACTIONS 08/15/2008   Qualifier: Diagnosis of  By: Bartley MD, Lamar Mulch    RENAL CALCULUS, HX OF 08/15/2008   Qualifier: Diagnosis of  By: Judyth LPN, Wanda     Rotator cuff tendinitis 06/29/2011   Seasonal allergies    Sleep apnea    Urinary incontinence 05/2009   hx bladder sling--Dr. Nikki    Social History   Socioeconomic History   Marital status: Married    Spouse name: Not on file   Number of children: 2   Years of education: Not on file   Highest education level: Associate degree: occupational, Scientist, product/process development, or vocational program  Occupational History   Occupation: [employer]  Tobacco Use   Smoking status: Former    Current packs/day: 0.00    Types: Cigarettes    Start date: 01/05/1989    Quit date: 01/06/1999    Years since quitting: 25.4   Smokeless tobacco: Never  Vaping Use   Vaping status: Never Used  Substance and Sexual Activity   Alcohol use: No    Alcohol/week: 0.0 standard drinks of alcohol   Drug use:  No   Sexual activity: Yes    Partners: Male    Birth control/protection: Surgical    Comment: TAH/LSO  Other Topics Concern   Not on file  Social History Narrative   ** Merged History Encounter **       Social Drivers of Health   Financial Resource Strain: Low Risk  (05/29/2024)   Overall Financial Resource Strain (CARDIA)    Difficulty of Paying Living Expenses: Not hard at all  Food Insecurity: No Food Insecurity (05/29/2024)   Hunger Vital Sign    Worried About Running Out of Food in the Last Year: Never true    Ran Out of Food in the Last Year: Never true  Transportation Needs: No Transportation Needs (05/29/2024)   PRAPARE - Administrator, Civil Service (Medical): No    Lack of Transportation  (Non-Medical): No  Physical Activity: Insufficiently Active (05/29/2024)   Exercise Vital Sign    Days of Exercise per Week: 1 day    Minutes of Exercise per Session: 20 min  Stress: No Stress Concern Present (05/29/2024)   [employer] of Occupational Health - Occupational Stress Questionnaire    Feeling of Stress: Only a little  Social Connections: Socially Integrated (05/29/2024)   Social Connection and Isolation Panel    Frequency of Communication with Friends and Family: More than three times a week    Frequency of Social Gatherings with Friends and Family: Three times a week    Attends Religious Services: More than 4 times per year    Active Member of Clubs or Organizations: Yes    Attends Banker Meetings: More than 4 times per year    Marital Status: Married  Catering manager Violence: Unknown (01/15/2022)   Received from [facility]   HITS    Physically Hurt: Not on file    Insult or Talk Down To: Not on file    Threaten Physical Harm: Not on file    Scream or Curse: Not on file    Past Surgical History:  Procedure Laterality Date   ABDOMINAL HYSTERECTOMY  05/2009   TAH/LSO   BLADDER SUSPENSION  2010   Dr. Nikki   BREAST BIOPSY     left stereo   CESAREAN SECTION  03/2003   CESAREAN SECTION  2004   CHOLECYSTECTOMY N/A 01/02/2021   Procedure: LAPAROSCOPIC CHOLECYSTECTOMY;  Surgeon: Ebbie Cough, MD;  Location: [facility];  Service: General;  Laterality: N/A;  58  - GEN AND TAP BLOCK LDOW   COLONOSCOPY  09/08   COLPORRHAPHY  05/2009   Dr. Nikki   ENDOMETRIAL ABLATION  2006   Dr. Nikki   INCONTINENCE SURGERY     sling for urinary incontinence   IR IMAGING GUIDED PORT INSERTION  09/01/2021   IR REMOVAL TUN ACCESS W/ PORT W/O FL MOD SED  10/29/2022   KYPHOPLASTY N/A 04/07/2021   Procedure: Thoracic 12 Vertebral body biopsy;  Surgeon: Colon Shove, MD;  Location: [facility] OR;  Service: Neurosurgery;  Laterality: N/A;   LAPAROSCOPIC ENDOMETRIOSIS FULGURATION      ablation   LUMBAR LAMINECTOMY/DECOMPRESSION MICRODISCECTOMY N/A 07/31/2021   Procedure: Open Transpedicular biopsy of Thoracic twelve;  Surgeon: Colon Shove, MD;  Location: [facility] OR;  Service: Neurosurgery;  Laterality: N/A;   OOPHORECTOMY  2010   w/hyst--Dr. Nikki   TUBAL LIGATION  2004   w/ C-section Dr. Nikki   TYMPANOPLASTY     right ear   VAGINAL HYSTERECTOMY  05/21/2009   lap assisted /partial  Family History  Problem Relation Age of Onset   Kidney disease Mother        renal failure   Stroke Mother    Diabetes Mother    Hypertension Mother    Heart failure Mother    Colon cancer Mother    Stroke Father    Hypertension Sister    Rheum arthritis Sister    Hypertension Sister    Hypertension Sister    Hypertension Sister    Breast cancer Sister    Kidney cancer Sister    Hypertension Brother    Cancer - Colon Maternal Aunt    Esophageal cancer Neg Hx    Rectal cancer Neg Hx    Stomach cancer Neg Hx    Colon polyps Neg Hx     Allergies  Allergen Reactions   Buspar  [Buspirone  Hcl]     Worsened her panic attacks    Current Outpatient Medications on File Prior to Visit  Medication Sig Dispense Refill   acetaminophen  (TYLENOL ) 500 MG tablet Take 1,000 mg by mouth every 6 (six) hours as needed for moderate pain or headache.     albuterol  (VENTOLIN  HFA) 108 (90 Base) MCG/ACT inhaler TAKE 2 PUFFS BY MOUTH EVERY 6 HOURS AS NEEDED FOR WHEEZE OR SHORTNESS OF BREATH 8.5 each 2   aspirin  EC 81 MG tablet Take 1 Tablet by Mouth Daily Swallow whole. 90 tablet 1   b complex vitamins capsule Take 1 capsule by mouth daily.     betamethasone  valerate (VALISONE ) 0.1 % cream Apply topically as needed.     budesonide -formoterol  (SYMBICORT ) 160-4.5 MCG/ACT inhaler Inhale 2 puffs into the lungs in the morning and at bedtime. 3 each 3   cetirizine (ZYRTEC) 10 MG tablet Take 10 mg by mouth daily.      Cholecalciferol (VITAMIN D3) 125 MCG (5000 UT) CAPS Take 5,000 Units by mouth  daily.     hydrOXYzine  (ATARAX ) 10 MG tablet Take 1 or 2 tablets by mouth as needed for anxiety attack. 15 tablet 0   losartan -hydrochlorothiazide  (HYZAAR) 50-12.5 MG tablet TAKE 1 TABLET BY MOUTH DAILY FOR BLOOD PRESSURE 90 tablet 0   Methylcobalamin (B-12) 5000 MCG TBDP Take 5,000 mcg by mouth daily.     metoprolol  succinate (TOPROL  XL) 25 MG 24 hr tablet Take 1 tablet (25 mg total) by mouth daily. 90 tablet 1   rosuvastatin  (CRESTOR ) 5 MG tablet Take 1 tablet (5 mg total) by mouth every evening. 90 tablet 1   venlafaxine  XR (EFFEXOR -XR) 75 MG 24 hr capsule TAKE 1 CAPSULE (75 MG TOTAL) BY MOUTH DAILY WITH BREAKFAST. FOR HOT FLASHES AND ANXIETY 90 capsule 0   No current facility-administered medications on file prior to visit.    BP 128/84   Pulse 74   Temp (!) 97.2 F (36.2 C) (Temporal)   Ht 5' 4 (1.626 m)   Wt 275 lb (124.7 kg)   SpO2 96%   BMI 47.20 kg/m  Objective:   Physical Exam HENT:     Right Ear: Tympanic membrane and ear canal normal.     Left Ear: Tympanic membrane and ear canal normal.  Eyes:     Pupils: Pupils are equal, round, and reactive to light.  Cardiovascular:     Rate and Rhythm: Normal rate and regular rhythm.  Pulmonary:     Effort: Pulmonary effort is normal.     Breath sounds: Normal breath sounds.  Abdominal:     General: Bowel sounds are normal.  Palpations: Abdomen is soft.     Tenderness: There is no abdominal tenderness.  Musculoskeletal:        General: Normal range of motion.     Cervical back: Neck supple.  Skin:    General: Skin is warm and dry.  Neurological:     Mental Status: She is alert and oriented to person, place, and time.     Cranial Nerves: No cranial nerve deficit.     Deep Tendon Reflexes:     Reflex Scores:      Patellar reflexes are 2+ on the right side and 2+ on the left side. Psychiatric:        Mood and Affect: Mood normal.     Physical Exam        Assessment & Plan:  Essential  hypertension Assessment & Plan: Controlled.   Continue losartan -hydrochlorothiazide  50-12.5 mg daily, metoprolol  succinate 25 mg daily. CMP reviewed from August 2025   Type 2 diabetes mellitus with hyperglycemia, without long-term current use of insulin (HCC) Assessment & Plan: Repeat A1C pending.   Remain off treatment. Urine microalbumin pending.   Orders: -     Hemoglobin A1c -     Microalbumin / creatinine urine ratio  Screening mammogram for breast cancer -     3D Screening Mammogram, Left and Right; Future  Hyperlipidemia, unspecified hyperlipidemia type Assessment & Plan: Myalgia.  We discussed to hold rosuvastatin  x 2 weeks to see if this improves.  She will update.  Repeat lipid panel pending.  Orders: -     Lipid panel  Family history of colon cancer in mother and 2 aunts Assessment & Plan: Colonoscopy up-to-date, due 2029.    Exertional dyspnea Assessment & Plan: Following with both cardiology and pulmonology, pulmonology notes reviewed from July 2025.  Continue Symbicort  160-4.5 mcg 2 puffs BID, albuterol  inhaler as needed.   Preventative health care Assessment & Plan: Immunizations UTD. Mammogram due, orders placed. Colonoscopy UTD, due 2029  Discussed the importance of a healthy diet and regular exercise in order for weight loss, and to reduce the risk of further co-morbidity.  Exam stable. Labs pending.  Follow up in 1 year for repeat physical.    Diffuse large B-cell lymphoma, unspecified body region [facility]) Assessment & Plan: Following with oncology, labs reviewed from August 2025. Chest abdomen pelvis CT reviewed from February 2025.   Anxiety Assessment & Plan: Overall controlled.  Continue hydroxyzine  10 mg as needed.   Continue venlafaxine  ER 75 mg daily.     Assessment and Plan Assessment & Plan         Comer MARLA Gaskins, NP

## 2024-05-30 NOTE — Assessment & Plan Note (Signed)
 Following with both cardiology and pulmonology, pulmonology notes reviewed from July 2025.  Continue Symbicort  160-4.5 mcg 2 puffs BID, albuterol  inhaler as needed.

## 2024-05-30 NOTE — Assessment & Plan Note (Signed)
 Myalgia.  We discussed to hold rosuvastatin  x 2 weeks to see if this improves.  She will update.  Repeat lipid panel pending.

## 2024-05-31 ENCOUNTER — Ambulatory Visit: Payer: Self-pay | Admitting: Primary Care

## 2024-06-01 ENCOUNTER — Ambulatory Visit: Attending: Pulmonary Disease

## 2024-06-01 DIAGNOSIS — R0602 Shortness of breath: Secondary | ICD-10-CM | POA: Diagnosis present

## 2024-06-01 MED ORDER — METHACHOLINE 0 MG/ML NEB SOLN
3.0000 mL | Freq: Once | RESPIRATORY_TRACT | Status: AC
  Administered 2024-06-01: 3 mL via RESPIRATORY_TRACT
  Filled 2024-06-01: qty 3

## 2024-06-01 MED ORDER — METHACHOLINE 4 MG/ML NEB SOLN
3.0000 mL | Freq: Once | RESPIRATORY_TRACT | Status: AC
  Administered 2024-06-01: 12 mg via RESPIRATORY_TRACT
  Filled 2024-06-01: qty 3

## 2024-06-01 MED ORDER — METHACHOLINE 0.0625 MG/ML NEB SOLN
3.0000 mL | Freq: Once | RESPIRATORY_TRACT | Status: AC
  Administered 2024-06-01: 0.1875 mg via RESPIRATORY_TRACT
  Filled 2024-06-01: qty 3

## 2024-06-01 MED ORDER — ALBUTEROL SULFATE (2.5 MG/3ML) 0.083% IN NEBU
2.5000 mg | INHALATION_SOLUTION | Freq: Once | RESPIRATORY_TRACT | Status: AC
  Administered 2024-06-01: 2.5 mg via RESPIRATORY_TRACT
  Filled 2024-06-01: qty 3

## 2024-06-01 MED ORDER — METHACHOLINE 16 MG/ML NEB SOLN
3.0000 mL | Freq: Once | RESPIRATORY_TRACT | Status: AC
  Administered 2024-06-01: 48 mg via RESPIRATORY_TRACT
  Filled 2024-06-01: qty 3

## 2024-06-01 MED ORDER — METHACHOLINE 0.25 MG/ML NEB SOLN
3.0000 mL | Freq: Once | RESPIRATORY_TRACT | Status: AC
  Administered 2024-06-01: 0.75 mg via RESPIRATORY_TRACT
  Filled 2024-06-01: qty 3

## 2024-06-01 MED ORDER — METHACHOLINE 1 MG/ML NEB SOLN
3.0000 mL | Freq: Once | RESPIRATORY_TRACT | Status: AC
  Administered 2024-06-01: 3 mg via RESPIRATORY_TRACT
  Filled 2024-06-01: qty 3

## 2024-06-30 ENCOUNTER — Encounter: Payer: Self-pay | Admitting: Hematology

## 2024-07-03 ENCOUNTER — Ambulatory Visit: Admitting: Sleep Medicine

## 2024-07-04 ENCOUNTER — Other Ambulatory Visit: Payer: Self-pay | Admitting: Primary Care

## 2024-07-04 ENCOUNTER — Telehealth: Payer: Self-pay

## 2024-07-04 DIAGNOSIS — G4733 Obstructive sleep apnea (adult) (pediatric): Secondary | ICD-10-CM

## 2024-07-04 DIAGNOSIS — R232 Flushing: Secondary | ICD-10-CM

## 2024-07-04 DIAGNOSIS — I10 Essential (primary) hypertension: Secondary | ICD-10-CM

## 2024-07-04 NOTE — Telephone Encounter (Signed)
 Dr. Jess- okay to place order for travel CPAP?

## 2024-07-04 NOTE — Telephone Encounter (Signed)
 Per Mychart message from the patient's husband under his chart,  Hi - I've taken my CPAP on a couple of trips and now see the need to have a secondary travel CPAP that is smaller and easier to carry but also prevents potential damage or loss of my main CPAP.   I've found that the [employer] CPAP fits the need based on reviews and also found a place online to purchase at a reasonable price for both myself and wife Virginia Oneal.  However, the company needs a copy of our CPAP prescriptions to provide the units.   Can you send us  each a copy of our prescriptions?

## 2024-07-04 NOTE — Telephone Encounter (Signed)
 Per Dr. Jess - okay to place order for travel CPAP.

## 2024-07-05 ENCOUNTER — Other Ambulatory Visit: Payer: Self-pay | Admitting: Primary Care

## 2024-07-05 DIAGNOSIS — I10 Essential (primary) hypertension: Secondary | ICD-10-CM

## 2024-07-05 DIAGNOSIS — R232 Flushing: Secondary | ICD-10-CM

## 2024-07-05 NOTE — Telephone Encounter (Signed)
 Noted

## 2024-07-05 NOTE — Telephone Encounter (Signed)
 Spoke with pharmacy, patient picked up both 90 day supplies already. Spoke with patient, she will look back through her medications at home to find these bottles.

## 2024-07-05 NOTE — Telephone Encounter (Signed)
 Call [facility]: I sent a 90 day Rx for both venlafaxine  and losartan -hydrochlorothiazide  on 05/28/2024. Do they not have this 90 day Rx on file? If so, it's too soon to refill.

## 2024-07-10 ENCOUNTER — Ambulatory Visit
Admission: RE | Admit: 2024-07-10 | Discharge: 2024-07-10 | Disposition: A | Discharge status: Home / Self Care | Source: Ambulatory Visit | Attending: Primary Care | Admitting: Primary Care

## 2024-07-10 DIAGNOSIS — Z1231 Encounter for screening mammogram for malignant neoplasm of breast: Secondary | ICD-10-CM

## 2024-07-26 ENCOUNTER — Ambulatory Visit: Admitting: Pulmonary Disease

## 2024-07-27 LAB — PULMONARY FUNCTION TEST ARMC ONLY
FEF 25-75 Post: 2.01 L/s
FEF 25-75 Pre: 2.88 L/s
FEF2575-%Change-Post: -30 %
FEF2575-%Pred-Post: 85 %
FEF2575-%Pred-Pre: 122 %
FEV1-%Change-Post: -7 %
FEV1-%Pred-Post: 99 %
FEV1-%Pred-Pre: 108 %
FEV1-Post: 2.55 L
FEV1-Pre: 2.76 L
FEV1FVC-%Change-Post: -3 %
FEV1FVC-%Pred-Pre: 104 %
FEV6-%Change-Post: -4 %
FEV6-%Pred-Post: 101 %
FEV6-%Pred-Pre: 105 %
FEV6-Post: 3.23 L
FEV6-Pre: 3.37 L
FEV6FVC-%Pred-Post: 103 %
FEV6FVC-%Pred-Pre: 103 %
FVC-%Change-Post: -4 %
FVC-%Pred-Post: 97 %
FVC-%Pred-Pre: 102 %
FVC-Post: 3.23 L
Post FEV1/FVC ratio: 79 %
Post FEV6/FVC ratio: 100 %
Pre FEV1/FVC ratio: 82 %
Pre FEV6/FVC Ratio: 100 %

## 2024-08-06 ENCOUNTER — Other Ambulatory Visit: Payer: Self-pay | Admitting: Pulmonary Disease

## 2024-08-06 DIAGNOSIS — R0602 Shortness of breath: Secondary | ICD-10-CM

## 2024-08-16 ENCOUNTER — Ambulatory Visit (INDEPENDENT_AMBULATORY_CARE_PROVIDER_SITE_OTHER)

## 2024-08-16 DIAGNOSIS — Z23 Encounter for immunization: Secondary | ICD-10-CM

## 2024-08-31 ENCOUNTER — Other Ambulatory Visit: Payer: Self-pay | Admitting: Pulmonary Disease

## 2024-08-31 DIAGNOSIS — R0602 Shortness of breath: Secondary | ICD-10-CM

## 2024-09-05 ENCOUNTER — Other Ambulatory Visit: Payer: Self-pay | Admitting: Primary Care

## 2024-09-05 ENCOUNTER — Telehealth: Payer: Self-pay

## 2024-09-05 DIAGNOSIS — R232 Flushing: Secondary | ICD-10-CM

## 2024-09-05 DIAGNOSIS — F419 Anxiety disorder, unspecified: Secondary | ICD-10-CM

## 2024-09-05 DIAGNOSIS — I10 Essential (primary) hypertension: Secondary | ICD-10-CM

## 2024-09-05 NOTE — Telephone Encounter (Signed)
 I have added med list to letter head and sent via my chart. Left message to return call to our office.    Please provide message from provider/office when call is returned from patient.

## 2024-09-05 NOTE — Telephone Encounter (Signed)
 Copied from CRM #[id number]. Topic: Clinical - Medication Refill >> Sep 05, 2024 11:20 AM [name] wrote: Medication: hydrOXYzine  (ATARAX ) 10 MG tablet ;  losartan -hydrochlorothiazide  (HYZAAR) 50-12.5 MG tablet ;  venlafaxine  XR (EFFEXOR -XR) 75 MG 24 hr capsule   Has the patient contacted their pharmacy? Yes (Agent: If no, request that the patient contact the pharmacy for the refill. If patient does not wish to contact the pharmacy document the reason why and proceed with request.) (Agent: If yes, when and what did the pharmacy advise?)  This is the patient's preferred pharmacy:  [phone] [place] - [address] [place] [address] Phone: [phone] Fax: [phone]  Is this the correct pharmacy for this prescription? Yes If no, delete pharmacy and type the correct one.   Has the prescription been filled recently? Yes  Is the patient out of the medication? Yes  Has the patient been seen for an appointment in the last year OR does the patient have an upcoming appointment? Yes  Can we respond through MyChart? Yes  Agent: Please be advised that Rx refills may take up to 3 business days. We ask that you follow-up with your pharmacy.

## 2024-09-05 NOTE — Telephone Encounter (Signed)
 Copied from CRM #[id number]. Topic: Clinical - Medical Advice >> Sep 05, 2024 11:23 AM [name] wrote: Reason for CRM: Patient is going out of town on 11/28 on an internation flight- wanting to know if she is able to get a print out list of medications she take with the offices letter head

## 2024-09-06 ENCOUNTER — Other Ambulatory Visit: Payer: Self-pay | Admitting: Primary Care

## 2024-09-06 DIAGNOSIS — I10 Essential (primary) hypertension: Secondary | ICD-10-CM

## 2024-09-06 NOTE — Telephone Encounter (Signed)
 Spoke with pt. She did receive the letter sent to her. Nothing further was needed.

## 2024-09-18 ENCOUNTER — Ambulatory Visit: Admitting: Pulmonary Disease

## 2024-09-24 ENCOUNTER — Other Ambulatory Visit: Payer: Self-pay

## 2024-09-24 ENCOUNTER — Ambulatory Visit
Admission: EM | Admit: 2024-09-24 | Discharge: 2024-09-24 | Disposition: A | Discharge status: Home / Self Care | Attending: Emergency Medicine | Admitting: Emergency Medicine

## 2024-09-24 DIAGNOSIS — H7291 Unspecified perforation of tympanic membrane, right ear: Secondary | ICD-10-CM

## 2024-09-24 DIAGNOSIS — J069 Acute upper respiratory infection, unspecified: Secondary | ICD-10-CM

## 2024-09-24 LAB — POC COVID19/FLU A&B COMBO
Covid Antigen, POC: NEGATIVE
Influenza A Antigen, POC: NEGATIVE
Influenza B Antigen, POC: NEGATIVE

## 2024-09-24 LAB — POCT RAPID STREP A (OFFICE): Rapid Strep A Screen: NEGATIVE

## 2024-09-24 MED ORDER — AMOXICILLIN 875 MG PO TABS: 875.0000 mg | ORAL_TABLET | Freq: Two times a day (BID) | ORAL | 0 refills | Status: AC

## 2024-09-24 NOTE — ED Provider Notes (Signed)
 CAY RALPH PELT    CSN: [id number] Arrival date & time: 09/24/24  1251      History   Chief Complaint Chief Complaint  Patient presents with   Nasal Congestion   Otalgia    HPI Virginia Oneal is a 58 y.o. female.  Patient presents with 1 week history of congestion, ear pain, sore throat, hoarse voice, cough.  Her symptoms got worse 2 days ago.  She reports increased congestion and cough.  She had bleeding from her right ear yesterday during a coughing episode.  No fever, shortness of breath, vomiting, diarrhea.  Her symptoms started while she and her husband were traveling in [place].  Her husband was diagnosed with pneumonia and sinus infection.  Patient reports remote history of right eardrum rupture requiring surgical patch; she states this was approximately 20 years ago.  Her medical history also includes hypertension, diabetes, hyperlipidemia, history of lymphoma.  The history is provided by the patient and medical records.    Past Medical History:  Diagnosis Date   Abnormal uterine bleeding    Anemia    as a teenager   Anxiety    postpartum and hx panic attacks   Arrhythmia    Arthritis    back and hands   b cell lymphoma 08/2021   B cell   CAD (coronary artery disease) 07/22/2023   Chest mass    by CT scanning   Chest pain 07/26/2014   Complication of anesthesia    slow to wake up   COVID-19 10/2020   Depression    DOE (dyspnea on exertion) 07/26/2014   Family history of adverse reaction to anesthesia    mom slow to wake up   Gestational diabetes    resolved after pregnancy   Gross hematuria 11/20/2020   History of kidney stones    Hypertension    Morbid (severe) obesity due to excess calories (HCC) 07/22/2023   bmi 44.29   Personal history of colonic polyps    PONV (postoperative nausea and vomiting)    PREMATURE ATRIAL CONTRACTIONS 08/15/2008   Qualifier: Diagnosis of  By: Bartley MD, Lamar Mulch    RENAL CALCULUS, HX OF 08/15/2008    Qualifier: Diagnosis of  By: Judyth LPN, Wanda     Rotator cuff tendinitis 06/29/2011   Seasonal allergies    Sleep apnea    Urinary incontinence 05/2009   hx bladder sling--Dr. Nikki    Patient Active Problem List   Diagnosis Date Noted   Daytime sleepiness 05/27/2023   Hyperlipidemia 05/27/2023   Port-A-Cath in place 09/18/2021   Diffuse large B cell lymphoma (HCC) 09/01/2021   Counseling regarding advance care planning and goals of care 09/01/2021   Thoracic spine tumor 07/31/2021   Preventative health care 05/01/2021   Hot flashes 05/01/2021   Bone lesion 12/05/2020   Arthralgia of both hands 12/05/2020   Chronic bilateral low back pain 06/15/2019   Essential hypertension 11/09/2018   Family history of colon cancer in mother and 2 aunts 06/03/2018   Mediastinal cyst 02/24/2017   Lipoma 08/13/2015   Pedal edema 07/26/2014   Exertional dyspnea 07/26/2014   History of colonic polyps 01/18/2013   Anxiety 06/29/2011   Type 2 diabetes mellitus with hyperglycemia (HCC) 08/30/2008    Past Surgical History:  Procedure Laterality Date   ABDOMINAL HYSTERECTOMY  05/2009   TAH/LSO   BLADDER SUSPENSION  2010   Dr. Nikki   BREAST BIOPSY     left stereo   CESAREAN SECTION  03/2003   CESAREAN SECTION  2004   CHOLECYSTECTOMY N/A 01/02/2021   Procedure: LAPAROSCOPIC CHOLECYSTECTOMY;  Surgeon: Ebbie Cough, MD;  Location: [facility];  Service: General;  Laterality: N/A;  58  - GEN AND TAP BLOCK LDOW   COLONOSCOPY  09/08   COLPORRHAPHY  05/2009   Dr. Nikki   ENDOMETRIAL ABLATION  2006   Dr. Nikki   INCONTINENCE SURGERY     sling for urinary incontinence   IR IMAGING GUIDED PORT INSERTION  09/01/2021   IR REMOVAL TUN ACCESS W/ PORT W/O FL MOD SED  10/29/2022   KYPHOPLASTY N/A 04/07/2021   Procedure: Thoracic 12 Vertebral body biopsy;  Surgeon: Colon Shove, MD;  Location: [facility] OR;  Service: Neurosurgery;  Laterality: N/A;   LAPAROSCOPIC ENDOMETRIOSIS FULGURATION     ablation    LUMBAR LAMINECTOMY/DECOMPRESSION MICRODISCECTOMY N/A 07/31/2021   Procedure: Open Transpedicular biopsy of Thoracic twelve;  Surgeon: Colon Shove, MD;  Location: [facility] OR;  Service: Neurosurgery;  Laterality: N/A;   OOPHORECTOMY  2010   w/hyst--Dr. Nikki   TUBAL LIGATION  2004   w/ C-section Dr. Nikki   TYMPANOPLASTY     right ear   VAGINAL HYSTERECTOMY  05/21/2009   lap assisted /partial    OB History     Gravida  2   Para  2   Term  2   Preterm      AB      Living  2      SAB      IAB      Ectopic      Multiple      Live Births  2            Home Medications    Prior to Admission medications  Medication Sig Start Date End Date Taking? Authorizing Provider  amoxicillin  (AMOXIL ) 875 MG tablet Take 1 tablet (875 mg total) by mouth 2 (two) times daily for 7 days. 09/24/24 10/01/24 Yes Corlis Burnard DEL, NP  acetaminophen  (TYLENOL ) 500 MG tablet Take 1,000 mg by mouth every 6 (six) hours as needed for moderate pain or headache.    [provider]  albuterol  (VENTOLIN  HFA) 108 (90 Base) MCG/ACT inhaler INHALE 2 PUFFS BY MOUTH EVERY 6 HOURS AS NEEDED FOR WHEEZE OR SHORTNESS OF BREATH 08/31/24   Assaker, Darrin, MD  aspirin  EC 81 MG tablet Take 1 Tablet by Mouth Daily Swallow whole. 03/20/24   Kate Lonni CROME, MD  b complex vitamins capsule Take 1 capsule by mouth daily. 09/25/21   Onesimo Emaline Brink, MD  betamethasone  valerate (VALISONE ) 0.1 % cream Apply topically as needed.    [provider]  budesonide -formoterol  (SYMBICORT ) 160-4.5 MCG/ACT inhaler Inhale 2 puffs into the lungs in the morning and at bedtime. 04/07/24   Assaker, Darrin, MD  cetirizine (ZYRTEC) 10 MG tablet Take 10 mg by mouth daily.  01/10/18   [provider]  Cholecalciferol (VITAMIN D3) 125 MCG (5000 UT) CAPS Take 5,000 Units by mouth daily.    [provider]  hydrOXYzine  (ATARAX ) 10 MG tablet Take 1 or 2 tablets by mouth as needed for anxiety  attack. 06/08/22   Clark, Katherine K, NP  losartan -hydrochlorothiazide  (HYZAAR) 50-12.5 MG tablet TAKE 1 TABLET BY MOUTH DAILY FOR BLOOD PRESSURE 09/06/24   Clark, Katherine K, NP  Methylcobalamin (B-12) 5000 MCG TBDP Take 5,000 mcg by mouth daily.    [provider]  metoprolol  succinate (TOPROL  XL) 25 MG 24 hr tablet Take 1 tablet (25  mg total) by mouth daily. 03/20/24   Kate Lonni CROME, MD  rosuvastatin  (CRESTOR ) 5 MG tablet Take 1 tablet (5 mg total) by mouth every evening. 03/20/24   Kate Lonni CROME, MD  venlafaxine  XR (EFFEXOR -XR) 75 MG 24 hr capsule TAKE 1 CAPSULE (75 MG TOTAL) BY MOUTH DAILY WITH BREAKFAST. FOR HOT FLASHES AND ANXIETY 09/05/24   Gretta Comer POUR, NP    Family History Family History  Problem Relation Age of Onset   Kidney disease Mother        renal failure   Stroke Mother    Diabetes Mother    Hypertension Mother    Heart failure Mother    Colon cancer Mother    Stroke Father    Hypertension Sister    Rheum arthritis Sister    Hypertension Sister    Hypertension Sister    Hypertension Sister    Breast cancer Sister    Kidney cancer Sister    Hypertension Brother    Cancer - Colon Maternal Aunt    Esophageal cancer Neg Hx    Rectal cancer Neg Hx    Stomach cancer Neg Hx    Colon polyps Neg Hx     Social History Social History[1]   Allergies   Buspar  [buspirone  hcl]   Review of Systems Review of Systems  Constitutional:  Negative for chills and fever.  HENT:  Positive for congestion, ear discharge, ear pain, sore throat and voice change.   Respiratory:  Positive for cough. Negative for shortness of breath.   Cardiovascular:  Negative for chest pain and palpitations.  Gastrointestinal:  Negative for diarrhea and vomiting.     Physical Exam Triage Vital Signs ED Triage Vitals  Encounter Vitals Group     BP 09/24/24 1402 (!) 146/89     Girls Systolic BP Percentile --      Girls Diastolic BP Percentile --      Boys  Systolic BP Percentile --      Boys Diastolic BP Percentile --      Pulse Rate 09/24/24 1402 93     Resp 09/24/24 1402 19     Temp 09/24/24 1402 97.9 F (36.6 C)     Temp Source 09/24/24 1402 Temporal     SpO2 09/24/24 1402 97 %     Weight --      Height --      Head Circumference --      Peak Flow --      Pain Score 09/24/24 1358 6     Pain Loc --      Pain Education --      Exclude from Growth Chart --    No data found.  Updated Vital Signs BP (!) 146/89 (BP Location: Right Arm)   Pulse 93   Temp 97.9 F (36.6 C) (Temporal)   Resp 19   SpO2 97%   Visual Acuity Right Eye Distance:   Left Eye Distance:   Bilateral Distance:    Right Eye Near:   Left Eye Near:    Bilateral Near:     Physical Exam Constitutional:      General: She is not in acute distress. HENT:     Right Ear: Tympanic membrane is perforated.     Left Ear: Tympanic membrane and ear canal normal.     Ears:     Comments: Right TM perforated.  No drainage or bleeding in canal.    Nose: Nose normal.     Mouth/Throat:  Mouth: Mucous membranes are moist.     Pharynx: Posterior oropharyngeal erythema present.  Cardiovascular:     Rate and Rhythm: Normal rate and regular rhythm.     Heart sounds: Normal heart sounds.  Pulmonary:     Effort: Pulmonary effort is normal. No respiratory distress.     Breath sounds: Normal breath sounds.  Neurological:     Mental Status: She is alert.      UC Treatments / Results  Labs (all labs ordered are listed, but only abnormal results are displayed) Labs Reviewed  POCT RAPID STREP A (OFFICE) - Normal  POC COVID19/FLU A&B COMBO - Normal    EKG   Radiology No results found.  Procedures Procedures (including critical care time)  Medications Ordered in UC Medications - No data to display  Initial Impression / Assessment and Plan / UC Course  I have reviewed the triage vital signs and the nursing notes.  Pertinent labs & imaging results that  were available during my care of the patient were reviewed by me and considered in my medical decision making (see chart for details).    Acute upper respiratory infection, perforation of right TM.  Afebrile and vital signs are stable.  Lungs are clear and O2 sat is 97% on room air.  Rapid strep, COVID, flu negative.  Patient reports history of rupture of right TM approximately 20 years ago that required surgical repair.  Treating today with amoxicillin .  Instructed her to call [facility] tomorrow for soonest available appointment.  Discussed symptomatic management for her URI symptoms including Tylenol  or ibuprofen as needed, plain Mucinex as needed.  Instructed her to follow-up with her PCP if her URI symptoms are not improving.  Education provided on upper respiratory infection and ruptured eardrum.  Final Clinical Impressions(s) / UC Diagnoses   Final diagnoses:  Acute upper respiratory infection  Perforation of right tympanic membrane     Discharge Instructions      Take the amoxicillin  as directed.  Follow-up with an ENT such as the one listed below for your ruptured eardrum.    Your strep, COVID, flu tests are all negative.    Take Tylenol  or ibuprofen as needed for discomfort.  Follow-up with your primary care provider.       ED Prescriptions     Medication Sig Dispense Auth. Provider   amoxicillin  (AMOXIL ) 875 MG tablet Take 1 tablet (875 mg total) by mouth 2 (two) times daily for 7 days. 14 tablet Corlis Burnard DEL, NP      PDMP not reviewed this encounter.    [1]  Social History Tobacco Use   Smoking status: Former    Current packs/day: 0.00    Types: Cigarettes    Start date: 01/05/1989    Quit date: 01/06/1999    Years since quitting: 25.7   Smokeless tobacco: Never  Vaping Use   Vaping status: Never Used  Substance Use Topics   Alcohol use: No    Alcohol/week: 0.0 standard drinks of alcohol   Drug use: No     Corlis Burnard DEL, NP 09/24/24 1438

## 2024-09-24 NOTE — Discharge Instructions (Addendum)
 Take the amoxicillin  as directed.  Follow-up with an ENT such as the one listed below for your ruptured eardrum.    Your strep, COVID, flu tests are all negative.    Take Tylenol  or ibuprofen as needed for discomfort.  Follow-up with your primary care provider.

## 2024-09-24 NOTE — ED Triage Notes (Signed)
 Pt being seen in UC for nasal congestion, bilateral ear pain, bleeding from R ear, sore throat and hoarseness in voice. Pt reports recent travel overseas to [place]. Pt reports having fever at home. Pt reports husband has been recently diagnosed with pneumonia and sinus infection.

## 2024-11-03 ENCOUNTER — Encounter: Payer: Self-pay | Admitting: Pulmonary Disease

## 2024-11-03 ENCOUNTER — Ambulatory Visit: Admitting: Pulmonary Disease

## 2024-11-03 DIAGNOSIS — J984 Other disorders of lung: Secondary | ICD-10-CM

## 2024-11-03 DIAGNOSIS — R0602 Shortness of breath: Secondary | ICD-10-CM

## 2024-11-03 DIAGNOSIS — Z8616 Personal history of COVID-19: Secondary | ICD-10-CM

## 2024-11-03 NOTE — Progress Notes (Signed)
 "  Synopsis: Referred in by Gretta Comer POUR, NP   Subjective:   PATIENT ID: Virginia Oneal GLORI: female DOB: December 23, 1965, MRN: [id number]  Chief Complaint  Patient presents with   Shortness of Breath    DOE.  Albuterol - PRN. Symbicort - BID, helps with her breathing.    HPI [name] is a 59 year old female patient with a past medical history of non-Hodgkin B-cell lymphoma diagnosed in 2022 by surgical biopsy of vertebral body status post R-CHOP chemoimmunotherapy currently in remission presenting today to the pulmonary clinic with ongoing shortness of breath on exertion.   She reports that for the last year and after she was finished with chemotherapy she started having shortness of breath on exertion.  It is described as chest tightness when walking.  She used to walk without any issues however now she needs to rest after few steps.   She denies any wheezing cough with sputum production.  She does report COVID pneumonia several times.  She is not on any inhaler and never had the diagnosis of asthma.  She was never hospitalized for pulmonary issues.   She does not have eczema but does report seasonal allergies which makes her break out in an itchy rash.  She denies any hypersensitivity to heat or cold or strong perfumes.   CT chest 11/16/2023 with biapical pleural-parenchymal scarring and scattered atelectasis.   Family history -sister with asthma   Social history -used to smoke 1 pack/day for 7 years.  Denies any alcohol or vaping.  She is a stay-at-home mom.  Has no pets at home.   OV 04/20/2024 - [name] is here to follow up on her PFT results that were normal. Unfortunately she had a minimal response with symbicort . She conitnues to experience shortness of breath on exertion. She denies shortness of breath at rest. Cardiac work up has been negative. Will plan obtaining a MCT to confirm diagnosis of asthma. Should that not result in a diagnosis we will proceed with a CPET  study.   OV 11/03/2024 - [name] is here to follow up on her MCT which was negative. Echocardiogram in 2025 was normal. PFTs were normal. No signs of ILD On CT chest in 11/2023. Overall picture consistent with deconditioning. Discussed focus on lifestyle modifications and she appears to be motivated and will start gradually re-introducing physical activity. Can discontinue her inhalers.   ROS All systems were reviewed and are negative except for the above  Objective:   Vitals:   11/03/24 0947  BP: 122/80  Pulse: 87  Temp: 97.8 F (36.6 C)  SpO2: 96%  Weight: 276 lb 12.8 oz (125.6 kg)  Height: 5' 4 (1.626 m)   96% on RA BMI Readings from Last 3 Encounters:  11/03/24 47.51 kg/m  05/30/24 47.20 kg/m  05/24/24 47.51 kg/m   Wt Readings from Last 3 Encounters:  11/03/24 276 lb 12.8 oz (125.6 kg)  05/30/24 275 lb (124.7 kg)  05/24/24 276 lb 12.8 oz (125.6 kg)    Physical Exam GEN: NAD, Healthy Appearing HEENT: Supple Neck, Reactive Pupils, EOMI  CVS: Normal S1, Normal S2, RRR, No murmurs or ES appreciated  Lungs: Clear bilateral air entry.  Abdomen: Soft, non tender, non distended, + BS  Extremities: Warm and well perfused, No edema  Skin: No suspicious lesions appreciated  Psych: Normal Affect  Ancillary Information   CBC    Component Value Date/Time   WBC 5.5 05/24/2024 0841   WBC 6.7 08/20/2021 1442   RBC  4.19 05/24/2024 0841   HGB 13.3 05/24/2024 0841   HGB 14.2 11/18/2020 1607   HGB 13.8 02/02/2014 1145   HCT 38.6 05/24/2024 0841   HCT 43.3 11/18/2020 1607   PLT 141 (L) 05/24/2024 0841   PLT 165 11/18/2020 1607   MCV 92.1 05/24/2024 0841   MCV 88 11/18/2020 1607   MCH 31.7 05/24/2024 0841   MCHC 34.5 05/24/2024 0841   RDW 13.0 05/24/2024 0841   RDW 12.5 11/18/2020 1607   LYMPHSABS 1.4 05/24/2024 0841   LYMPHSABS 1.9 11/18/2020 1607   MONOABS 0.4 05/24/2024 0841   EOSABS 0.1 05/24/2024 0841   EOSABS 0.3 11/18/2020 1607   BASOSABS 0.0 05/24/2024  0841   BASOSABS 0.0 11/18/2020 1607        Latest Ref Rng & Units 06/01/2024    9:24 AM 04/11/2024   10:41 AM  PFT Results  FVC-Pre L  3.58   FVC-Predicted Pre % 102  107   FVC-Post L 3.23  3.58   FVC-Predicted Post % 97  107   Pre FEV1/FVC % % 82  80   Post FEV1/FCV % % 79  81   FEV1-Pre L 2.76  2.85   FEV1-Predicted Pre % 108  110   FEV1-Post L 2.55  2.90   DLCO uncorrected ml/min/mmHg  27.54   DLCO UNC% %  136   DLVA Predicted %  120   TLC L  5.97   TLC % Predicted %  118   RV % Predicted %  121      Assessment & Plan:  [name] is a 59 year old female patient with a past medical history of non-Hodgkin B-cell lymphoma diagnosed in 2022 by surgical biopsy of vertebral body status post R-CHOP chemoimmunotherapy currently in remission presenting today to the pulmonary clinic with ongoing shortness of breath on exertion.   My impression of what we are dealing with is post COVID new onset obstructive airway disease.  Her CT scan does not show any parenchymal disease to explain her shortness of breath.  Echocardiogram in 2024 with normal EF, grade 1 diastolic dysfunction and normal RV function which does not explain her significant dyspnea.  Long-term cardiac monitor did not reveal any arrhythmias in 2024.   PFTs were normal and trial of ICS LABA was suboptimal.  Eosinophil count 100. MCT negative. Overall picture consistent with deconditioning. Discussed focus on lifestyle modifications and she appears to be motivated and will start gradually re-introducing physical activity. Can discontinue her inhalers.    RTC 12 monhts.   I personally spent a total of 40 minutes in the care of the patient today including preparing to see the patient, getting/reviewing separately obtained history, performing a medically appropriate exam/evaluation, counseling and educating, documenting clinical information in the EHR, independently interpreting results, and communicating results.   Darrin Barn, MD [facility] 11/03/2024 2:07 PM   "

## 2024-11-22 ENCOUNTER — Inpatient Hospital Stay: Admitting: Hematology

## 2024-11-22 ENCOUNTER — Inpatient Hospital Stay

## 2024-11-30 ENCOUNTER — Ambulatory Visit: Admitting: Primary Care
